# Patient Record
Sex: Male | Born: 1965
Health system: Southern US, Community
[De-identification: ages and names within clinical notes are randomized; demographics above are authoritative.]

## PROBLEM LIST (undated history)

## (undated) DIAGNOSIS — R195 Other fecal abnormalities: Secondary | ICD-10-CM

## (undated) DIAGNOSIS — M109 Gout, unspecified: Secondary | ICD-10-CM

## (undated) DIAGNOSIS — M1990 Unspecified osteoarthritis, unspecified site: Secondary | ICD-10-CM

## (undated) DIAGNOSIS — Z8049 Family history of malignant neoplasm of other genital organs: Secondary | ICD-10-CM

## (undated) DIAGNOSIS — G4730 Sleep apnea, unspecified: Secondary | ICD-10-CM

## (undated) DIAGNOSIS — Z809 Family history of malignant neoplasm, unspecified: Secondary | ICD-10-CM

## (undated) DIAGNOSIS — E785 Hyperlipidemia, unspecified: Secondary | ICD-10-CM

## (undated) DIAGNOSIS — C099 Malignant neoplasm of tonsil, unspecified: Secondary | ICD-10-CM

## (undated) HISTORY — DX: Gout, unspecified: M10.9

## (undated) HISTORY — PX: WISDOM TOOTH EXTRACTION: SHX21

## (undated) HISTORY — PX: OTHER SURGICAL HISTORY: SHX169

## (undated) HISTORY — DX: Family history of malignant neoplasm of other genital organs: Z80.49

## (undated) HISTORY — DX: Sleep apnea, unspecified: G47.30

## (undated) HISTORY — DX: Other fecal abnormalities: R19.5

## (undated) HISTORY — DX: Hyperlipidemia, unspecified: E78.5

## (undated) HISTORY — DX: Family history of malignant neoplasm, unspecified: Z80.9

---

## 2006-08-12 ENCOUNTER — Encounter: Admission: RE | Admit: 2006-08-12 | Discharge: 2006-09-28 | Payer: Self-pay | Admitting: Orthopedic Surgery

## 2015-12-05 ENCOUNTER — Ambulatory Visit (INDEPENDENT_AMBULATORY_CARE_PROVIDER_SITE_OTHER): Payer: 59 | Admitting: Otolaryngology

## 2015-12-05 DIAGNOSIS — H60332 Swimmer's ear, left ear: Secondary | ICD-10-CM | POA: Diagnosis not present

## 2015-12-19 ENCOUNTER — Ambulatory Visit (INDEPENDENT_AMBULATORY_CARE_PROVIDER_SITE_OTHER): Payer: 59 | Admitting: Otolaryngology

## 2015-12-19 DIAGNOSIS — H60332 Swimmer's ear, left ear: Secondary | ICD-10-CM

## 2017-01-25 ENCOUNTER — Encounter (INDEPENDENT_AMBULATORY_CARE_PROVIDER_SITE_OTHER): Payer: Self-pay | Admitting: Internal Medicine

## 2017-01-25 ENCOUNTER — Encounter (INDEPENDENT_AMBULATORY_CARE_PROVIDER_SITE_OTHER): Payer: Self-pay

## 2017-02-22 ENCOUNTER — Telehealth (INDEPENDENT_AMBULATORY_CARE_PROVIDER_SITE_OTHER): Payer: Self-pay | Admitting: *Deleted

## 2017-02-22 ENCOUNTER — Ambulatory Visit (INDEPENDENT_AMBULATORY_CARE_PROVIDER_SITE_OTHER): Payer: 59 | Admitting: Internal Medicine

## 2017-02-22 ENCOUNTER — Encounter (INDEPENDENT_AMBULATORY_CARE_PROVIDER_SITE_OTHER): Payer: Self-pay | Admitting: *Deleted

## 2017-02-22 ENCOUNTER — Other Ambulatory Visit (INDEPENDENT_AMBULATORY_CARE_PROVIDER_SITE_OTHER): Payer: Self-pay | Admitting: Internal Medicine

## 2017-02-22 ENCOUNTER — Encounter (INDEPENDENT_AMBULATORY_CARE_PROVIDER_SITE_OTHER): Payer: Self-pay | Admitting: Internal Medicine

## 2017-02-22 VITALS — BP 170/90 | HR 80 | Temp 98.1°F | Ht 68.5 in | Wt 312.5 lb

## 2017-02-22 DIAGNOSIS — R195 Other fecal abnormalities: Secondary | ICD-10-CM

## 2017-02-22 DIAGNOSIS — R748 Abnormal levels of other serum enzymes: Secondary | ICD-10-CM | POA: Diagnosis not present

## 2017-02-22 DIAGNOSIS — M109 Gout, unspecified: Secondary | ICD-10-CM

## 2017-02-22 HISTORY — DX: Gout, unspecified: M10.9

## 2017-02-22 HISTORY — DX: Other fecal abnormalities: R19.5

## 2017-02-22 LAB — CBC WITH DIFFERENTIAL/PLATELET
BASOS PCT: 0.7 %
Basophils Absolute: 50 cells/uL (ref 0–200)
EOS PCT: 2.5 %
Eosinophils Absolute: 180 cells/uL (ref 15–500)
HCT: 43.5 % (ref 38.5–50.0)
Hemoglobin: 15.2 g/dL (ref 13.2–17.1)
Lymphs Abs: 2657 cells/uL (ref 850–3900)
MCH: 30.9 pg (ref 27.0–33.0)
MCHC: 34.9 g/dL (ref 32.0–36.0)
MCV: 88.4 fL (ref 80.0–100.0)
MONOS PCT: 9.2 %
MPV: 10 fL (ref 7.5–12.5)
NEUTROS PCT: 50.7 %
Neutro Abs: 3650 cells/uL (ref 1500–7800)
PLATELETS: 216 10*3/uL (ref 140–400)
RBC: 4.92 10*6/uL (ref 4.20–5.80)
RDW: 13 % (ref 11.0–15.0)
TOTAL LYMPHOCYTE: 36.9 %
WBC: 7.2 10*3/uL (ref 3.8–10.8)
WBCMIX: 662 {cells}/uL (ref 200–950)

## 2017-02-22 MED ORDER — DICYCLOMINE HCL 10 MG PO CAPS: 10.0000 mg | ORAL_CAPSULE | Freq: Three times a day (TID) | ORAL | 3 refills | Status: DC

## 2017-02-22 NOTE — Telephone Encounter (Signed)
Patient needs trilyte 

## 2017-02-22 NOTE — Progress Notes (Addendum)
   Subjective:    Patient ID: Dustin Rios, male    DOB: Nov 10, 1965, 51 y.o.   MRN: [id number] HPI Referred by Dr. Scotty Court for change in his stools   Per Dr.. Rayna Sexton notes, CT in February of this was normal. In February, he had a mild headache, nausea, lower abdominal pain, fever (low grade), fatigue.  Went to Urgent Care and told he had the flu. Given Tamaflu. Saw Dr. Scotty Court, following week. Had blood work, liver enzymes were elevated and underwent a CT. States his spleen was enlarged. Given antibiotics. He did get better. No symptoms since. About 3 months ago, he was rough housing with his grand kids, he started having pain LLQ radiating into scrotum. Saw Dr. Scotty Court again. Also he started having problems having BM's. He now has urgency. Sometimes he will have 3-4 BM's in the morning. He has had 2 incidences of incontinence.. If he coughs or sneezes, he has the LLQ pulling.  His appetite has been good. No weight loss.  States his stools are not quite as solid as they use to be.  Urgency does not occur ever day.   Last colonoscopy in 2010 by Dr .Laural Golden for lower abdominal pain and rectal bleeding.: Normal colonoscopy except for small external hemorrhoids.  07/08/2016 CT abdomen/pelvis with CM: lower abdominal pain. No acute or focal lesion. Fat herniates into the rt inguinal canal and periumbilical region. No bowel associated at either level. Mild splenomegaly without etiology. Degenerative changes within the lower lumbar spine.   07/04/2008 CT abdomen/pelvis with CM: Rt lower quadrant pain and rectal bleeding  No acute findings in the abdomen. Review of Systems Past Medical History:  Diagnosis Date  . Change in stool 02/22/2017  . Gout 02/22/2017    No past surgical history on file.  No Known Allergies  No current outpatient prescriptions on file prior to visit.   No current facility-administered medications on file prior to visit.    Current Outpatient Prescriptions  Medication  Sig Dispense Refill  . colchicine 0.6 MG tablet Take 0.6 mg by mouth as needed.     No current facility-administered medications for this visit.          Objective:   Physical Exam Blood pressure (!) 170/90, pulse 80, temperature 98.1 F (36.7 C), height 5' 8.5" (1.74 m), weight (!) 312 lb 8 oz (141.7 kg). Alert and oriented. Skin warm and dry. Oral mucosa is moist.   . Sclera anicteric, conjunctivae is pink. Thyroid not enlarged. No cervical lymphadenopathy. Lungs clear. Heart regular rate and rhythm.  Abdomen is soft. Bowel sounds are positive. No hepatomegaly. No abdominal masses felt. No tenderness.  1+ edema to lower extremities.           Assessment & Plan:   Change in Stools. Last colonoscopy 6-7 yrs ago and was normal (rectal bleeding). Will go ahead ans set up for a colonoscopy.  Will get last colonoscopy report from [facility].  Elevated liver enzymes: CMET, CBC. Will get labs from DR. Tapper and CT scan done in February.

## 2017-02-22 NOTE — Patient Instructions (Signed)
Labs. The risks of bleeding, perforation and infection were reviewed with patient.  

## 2017-02-23 MED ORDER — PEG 3350-KCL-NA BICARB-NACL 420 G PO SOLR: 4000.0000 mL | Freq: Once | ORAL | 0 refills | Status: AC

## 2017-03-22 NOTE — Progress Notes (Signed)
Dustin Rios at Dr. Rayna Sexton office is going to have the nurse to fax his labs to [place].

## 2017-03-26 ENCOUNTER — Ambulatory Visit (HOSPITAL_COMMUNITY): Admission: RE | Admit: 2017-03-26 | Payer: 59 | Source: Ambulatory Visit | Admitting: Internal Medicine

## 2017-03-26 ENCOUNTER — Encounter (HOSPITAL_COMMUNITY): Admission: RE | Payer: Self-pay | Source: Ambulatory Visit

## 2017-03-26 SURGERY — COLONOSCOPY
Anesthesia: Moderate Sedation

## 2019-04-03 ENCOUNTER — Encounter: Payer: Self-pay | Admitting: Family Medicine

## 2019-04-03 ENCOUNTER — Ambulatory Visit: Payer: 59 | Admitting: Family Medicine

## 2019-04-03 ENCOUNTER — Telehealth: Payer: Self-pay | Admitting: Family Medicine

## 2019-04-03 ENCOUNTER — Other Ambulatory Visit: Payer: Self-pay

## 2019-04-03 ENCOUNTER — Other Ambulatory Visit: Payer: Self-pay | Admitting: Physician Assistant

## 2019-04-03 VITALS — BP 139/84 | HR 80 | Temp 97.5°F | Ht 68.0 in | Wt 310.0 lb

## 2019-04-03 DIAGNOSIS — E782 Mixed hyperlipidemia: Secondary | ICD-10-CM | POA: Diagnosis not present

## 2019-04-03 DIAGNOSIS — M10031 Idiopathic gout, right wrist: Secondary | ICD-10-CM

## 2019-04-03 DIAGNOSIS — Z125 Encounter for screening for malignant neoplasm of prostate: Secondary | ICD-10-CM

## 2019-04-03 LAB — BAYER DCA HB A1C WAIVED: HB A1C (BAYER DCA - WAIVED): 5.7 % (ref <7.0)

## 2019-04-03 MED ORDER — COLCHICINE 0.6 MG PO TABS: 0.6000 mg | ORAL_TABLET | Freq: Two times a day (BID) | ORAL | 1 refills | Status: DC | PRN

## 2019-04-03 MED ORDER — PREDNISONE 10 MG PO TABS: ORAL_TABLET | ORAL | 0 refills | Status: DC

## 2019-04-03 NOTE — Progress Notes (Signed)
Subjective: CC: wrist pain PCP: Terald Sleeper, PA-C EHU:[id number] Leath is a 53 y.o. male presenting to clinic today for:  1. Wrist pain Patient reports onset of right wrist pain and swelling on Thursday.  He thought this was consistent with his typical gout flare and has since been taking his colchicine capsules as directed.  He notes he was previously on colchicine tablets which he felt was more effective but he was switched to the capsules due to insurance.  He has been previously treated with prednisone which she also found to be very helpful.  He was actually scheduled for carpal tunnel surgery this morning but his surgeon canceled the surgery after seeing that his wrist was persistently swollen.  Patient denies any alcohol use.  He may have eaten red meat which cause the flare.  No known renal disease.   ROS: Per HPI  No Known Allergies Past Medical History:  Diagnosis Date  . Change in stool 02/22/2017  . Gout 02/22/2017    Current Outpatient Medications:  .  colchicine 0.6 MG tablet, Take 0.6-1.2 mg 2 (two) times daily as needed by mouth (for gout flare ups.). , Disp: , Rfl:    Social History   Socioeconomic History  . Marital status: Married    Spouse name: Not on file  . Number of children: Not on file  . Years of education: Not on file  . Highest education level: Not on file  Occupational History  . Not on file  Social Needs  . Financial resource strain: Not on file  . Food insecurity    Worry: Not on file    Inability: Not on file  . Transportation needs    Medical: Not on file    Non-medical: Not on file  Tobacco Use  . Smoking status: Never Smoker  . Smokeless tobacco: Never Used  Substance and Sexual Activity  . Alcohol use: No  . Drug use: No  . Sexual activity: Not on file  Lifestyle  . Physical activity    Days per week: Not on file    Minutes per session: Not on file  . Stress: Not on file  Relationships  . Social Herbalist on  phone: Not on file    Gets together: Not on file    Attends religious service: Not on file    Active member of club or organization: Not on file    Attends meetings of clubs or organizations: Not on file    Relationship status: Not on file  . Intimate partner violence    Fear of current or ex partner: Not on file    Emotionally abused: Not on file    Physically abused: Not on file    Forced sexual activity: Not on file  Other Topics Concern  . Not on file  Social History Narrative  . Not on file   No family history on file.  Objective: Office vital signs reviewed. BP 139/84   Pulse 80   Temp (!) 97.5 F (36.4 C) (Temporal)   Ht 5' 8" (1.727 m)   Wt (!) 310 lb (140.6 kg)   BMI 47.14 kg/m   Physical Examination:  General: Awake, alert, obese, No acute distress HEENT: Normal.  Sclera white.  Moist mucous membranes Cardio: regular rate.  +2 radial pulses Pulm: No wheezes.  Normal work of breathing on room air Extremities: warm, well perfused, mild to moderate nonpitting edema of the right wrist noted.  No cyanosis  or clubbing; +2 pulses bilaterally MSK:   Right wrist: Mild to moderate nonpitting soft tissue swelling noted.  He has limited active range of motion throughout secondary to pain and swelling.  No substantial warmth or redness noted over the right wrist.  Minimal tenderness palpation over the wrist. Neuro: Light touch sensation grossly intact.  Assessment/ Plan: 53 y.o. male   1. Acute idiopathic gout of right wrist Will empirically treat for gout.  We discussed the possible differential diagnosis including swelling secondary to known carpal tunnel/degenerative changes versus autoimmune versus infectious.  I suspect likely gout flare.  I have given him prednisone as a long taper to prevent rebound flare.  He has follow-up with PCP to establish care in about a week's time.  We will obtain a uric acid level as well as other labs today in preparation for his checkup -  CMP14+EGFR - CBC - Uric Acid - predniSONE (DELTASONE) 10 MG tablet; Take 60mg by mouth day 1-2, 50mg day 3-4, 40mg day 5-6, 30mg day 7-8, 20mg day 9-10, 10mg day 11-12.  Then stop.  Dispense: 42 tablet; Refill: 0  2. Moderate mixed hyperlipidemia not requiring statin therapy Not discussed but will obtain lipid and TSH in preparation for his upcoming appointment - Lipid Panel - TSH  3. Morbid obesity (HCC) At TSH - Bayer DCA Hb A1c Waived  4. Screening for malignant neoplasm of prostate - PSA   No orders of the defined types were placed in this encounter.  No orders of the defined types were placed in this encounter.    Ashly M Gottschalk, DO [facility] [phone]   

## 2019-04-03 NOTE — Patient Instructions (Signed)

## 2019-04-03 NOTE — Telephone Encounter (Signed)
Appointment scheduled today at 11:15 with Dr. Lajuana Ripple.  Patient aware.

## 2019-04-04 ENCOUNTER — Telehealth: Payer: Self-pay | Admitting: *Deleted

## 2019-04-04 ENCOUNTER — Telehealth: Payer: Self-pay | Admitting: Physician Assistant

## 2019-04-04 ENCOUNTER — Other Ambulatory Visit: Payer: Self-pay | Admitting: Family Medicine

## 2019-04-04 LAB — CMP14+EGFR
ALT: 32 IU/L (ref 0–44)
AST: 24 IU/L (ref 0–40)
Albumin/Globulin Ratio: 1.4 (ref 1.2–2.2)
Albumin: 4.3 g/dL (ref 3.8–4.9)
Alkaline Phosphatase: 92 IU/L (ref 39–117)
BUN/Creatinine Ratio: 14 (ref 9–20)
BUN: 15 mg/dL (ref 6–24)
Bilirubin Total: 0.3 mg/dL (ref 0.0–1.2)
CO2: 26 mmol/L (ref 20–29)
Calcium: 9.2 mg/dL (ref 8.7–10.2)
Chloride: 101 mmol/L (ref 96–106)
Creatinine, Ser: 1.09 mg/dL (ref 0.76–1.27)
GFR calc Af Amer: 88 mL/min/{1.73_m2} (ref >59)
GFR calc non Af Amer: 77 mL/min/{1.73_m2} (ref >59)
Globulin, Total: 3 g/dL (ref 1.5–4.5)
Glucose: 79 mg/dL (ref 65–99)
Potassium: 4.3 mmol/L (ref 3.5–5.2)
Sodium: 140 mmol/L (ref 134–144)
Total Protein: 7.3 g/dL (ref 6.0–8.5)

## 2019-04-04 LAB — CBC
Hematocrit: 45.4 % (ref 37.5–51.0)
Hemoglobin: 15.5 g/dL (ref 13.0–17.7)
MCH: 30.9 pg (ref 26.6–33.0)
MCHC: 34.1 g/dL (ref 31.5–35.7)
MCV: 90 fL (ref 79–97)
Platelets: 253 10*3/uL (ref 150–450)
RBC: 5.02 x10E6/uL (ref 4.14–5.80)
RDW: 13.1 % (ref 11.6–15.4)
WBC: 7.7 10*3/uL (ref 3.4–10.8)

## 2019-04-04 LAB — LIPID PANEL
Chol/HDL Ratio: 3.7 ratio (ref 0.0–5.0)
Cholesterol, Total: 125 mg/dL (ref 100–199)
HDL: 34 mg/dL — ABNORMAL LOW (ref >39)
LDL Chol Calc (NIH): 50 mg/dL (ref 0–99)
Triglycerides: 265 mg/dL — ABNORMAL HIGH (ref 0–149)
VLDL Cholesterol Cal: 41 mg/dL — ABNORMAL HIGH (ref 5–40)

## 2019-04-04 LAB — TSH: TSH: 2.93 u[IU]/mL (ref 0.450–4.500)

## 2019-04-04 LAB — URIC ACID: Uric Acid: 7.2 mg/dL (ref 3.7–8.6)

## 2019-04-04 LAB — PSA: Prostate Specific Ag, Serum: 0.7 ng/mL (ref 0.0–4.0)

## 2019-04-04 MED ORDER — COLCHICINE 0.6 MG PO CAPS: ORAL_CAPSULE | ORAL | 2 refills | Status: DC

## 2019-04-04 NOTE — Telephone Encounter (Signed)
Yes. That's what he has.  Is he out?

## 2019-04-04 NOTE — Telephone Encounter (Signed)
Patient aware.

## 2019-04-04 NOTE — Telephone Encounter (Signed)
Patient states he is going to check with pharmacy to see price difference.

## 2019-04-04 NOTE — Telephone Encounter (Signed)
done

## 2019-04-04 NOTE — Telephone Encounter (Signed)
Patient states that he has 5-6 pills left.

## 2019-04-04 NOTE — Telephone Encounter (Signed)
Fax from [facility] Rx colchicine 0.6 mg tablet Insurance prefers mitigane capsules

## 2019-04-10 HISTORY — PX: CARPAL TUNNEL RELEASE: SHX101

## 2019-04-12 ENCOUNTER — Other Ambulatory Visit: Payer: Self-pay

## 2019-04-13 ENCOUNTER — Ambulatory Visit: Payer: Self-pay | Admitting: Physician Assistant

## 2019-05-15 ENCOUNTER — Telehealth: Payer: Self-pay | Admitting: Physician Assistant

## 2019-05-15 NOTE — Telephone Encounter (Signed)
He should be able to drop this off and his PCP fill out.  She can use his last OV for details/ out date, etc.  We do this quite often when patient is seen by other specialists even if we did not see them for issue.  That will help keep everything in one place.

## 2019-05-15 NOTE — Telephone Encounter (Signed)
Pt has an upcoming appt to establish care with Particia Nearing on 06/08/2019 but says his FMLA paperwork that he has renewed every 6 months is due by 05/30/2019. Wants to know if he can be seen before 05/30/2019 or if Dr Darnell Level can fill out his FMLA paperwork since he saw her on 04/03/2019.

## 2019-05-15 NOTE — Telephone Encounter (Signed)
Dr Darnell Level would you be able to fill out FMLA paperwork out for gout since he saw you on 04/03/19 for gout flare up. I advised pt I wasn't sure we would be able to since you have only seen him the one time. Please advise?

## 2019-05-16 DIAGNOSIS — Z029 Encounter for administrative examinations, unspecified: Secondary | ICD-10-CM

## 2019-05-16 NOTE — Telephone Encounter (Signed)
Tye [place] get these started and I sign or find the answer if needed.  So okay with me.

## 2019-05-16 NOTE — Telephone Encounter (Signed)
Spoke with pt and advised him to bring in the forms. He says he also has a copy of the last FMLA forms that were filled out with his last PCP and will bring those in as well to help.

## 2019-06-07 ENCOUNTER — Other Ambulatory Visit: Payer: Self-pay

## 2019-06-08 ENCOUNTER — Ambulatory Visit: Payer: 59 | Admitting: Physician Assistant

## 2019-06-08 ENCOUNTER — Encounter: Payer: Self-pay | Admitting: Physician Assistant

## 2019-06-08 VITALS — BP 166/93 | HR 95 | Temp 97.4°F | Ht 68.0 in | Wt 314.6 lb

## 2019-06-08 DIAGNOSIS — M10031 Idiopathic gout, right wrist: Secondary | ICD-10-CM | POA: Diagnosis not present

## 2019-06-08 DIAGNOSIS — Z23 Encounter for immunization: Secondary | ICD-10-CM | POA: Diagnosis not present

## 2019-06-08 DIAGNOSIS — G4733 Obstructive sleep apnea (adult) (pediatric): Secondary | ICD-10-CM | POA: Insufficient documentation

## 2019-06-08 DIAGNOSIS — G5602 Carpal tunnel syndrome, left upper limb: Secondary | ICD-10-CM

## 2019-06-08 DIAGNOSIS — Z1211 Encounter for screening for malignant neoplasm of colon: Secondary | ICD-10-CM

## 2019-06-08 DIAGNOSIS — K429 Umbilical hernia without obstruction or gangrene: Secondary | ICD-10-CM

## 2019-06-08 DIAGNOSIS — E782 Mixed hyperlipidemia: Secondary | ICD-10-CM | POA: Diagnosis not present

## 2019-06-08 NOTE — Patient Instructions (Signed)
DASH Eating Plan DASH stands for "Dietary Approaches to Stop Hypertension." The DASH eating plan is a healthy eating plan that has been shown to reduce high blood pressure (hypertension). It may also reduce your risk for type 2 diabetes, heart disease, and stroke. The DASH eating plan may also help with weight loss. What are tips for following this plan?  General guidelines  Avoid eating more than 2,300 mg (milligrams) of salt (sodium) a day. If you have hypertension, you may need to reduce your sodium intake to 1,500 mg a day.  Limit alcohol intake to no more than 1 drink a day for nonpregnant women and 2 drinks a day for men. One drink equals 12 oz of beer, 5 oz of wine, or 1 oz of hard liquor.  Work with your health care provider to maintain a healthy body weight or to lose weight. Ask what an ideal weight is for you.  Get at least 30 minutes of exercise that causes your heart to beat faster (aerobic exercise) most days of the week. Activities may include walking, swimming, or biking.  Work with your health care provider or diet and nutrition specialist (dietitian) to adjust your eating plan to your individual calorie needs. Reading food labels   Check food labels for the amount of sodium per serving. Choose foods with less than 5 percent of the Daily Value of sodium. Generally, foods with less than 300 mg of sodium per serving fit into this eating plan.  To find whole grains, look for the word "whole" as the first word in the ingredient list. Shopping  Buy products labeled as "low-sodium" or "no salt added."  Buy fresh foods. Avoid canned foods and premade or frozen meals. Cooking  Avoid adding salt when cooking. Use salt-free seasonings or herbs instead of table salt or sea salt. Check with your health care provider or pharmacist before using salt substitutes.  Do not fry foods. Cook foods using healthy methods such as baking, boiling, grilling, and broiling instead.  Cook with  heart-healthy oils, such as olive, canola, soybean, or sunflower oil. Meal planning  Eat a balanced diet that includes: ? 5 or more servings of fruits and vegetables each day. At each meal, try to fill half of your plate with fruits and vegetables. ? Up to 6-8 servings of whole grains each day. ? Less than 6 oz of lean meat, poultry, or fish each day. A 3-oz serving of meat is about the same size as a deck of cards. One egg equals 1 oz. ? 2 servings of low-fat dairy each day. ? A serving of nuts, seeds, or beans 5 times each week. ? Heart-healthy fats. Healthy fats called Omega-3 fatty acids are found in foods such as flaxseeds and coldwater fish, like sardines, salmon, and mackerel.  Limit how much you eat of the following: ? Canned or prepackaged foods. ? Food that is high in trans fat, such as fried foods. ? Food that is high in saturated fat, such as fatty meat. ? Sweets, desserts, sugary drinks, and other foods with added sugar. ? Full-fat dairy products.  Do not salt foods before eating.  Try to eat at least 2 vegetarian meals each week.  Eat more home-cooked food and less restaurant, buffet, and fast food.  When eating at a restaurant, ask that your food be prepared with less salt or no salt, if possible. What foods are recommended? The items listed may not be a complete list. Talk with your dietitian about   what dietary choices are best for you. Grains Whole-grain or whole-wheat bread. Whole-grain or whole-wheat pasta. Brown rice. Oatmeal. Quinoa. Bulgur. Whole-grain and low-sodium cereals. Pita bread. Low-fat, low-sodium crackers. Whole-wheat flour tortillas. Vegetables Fresh or frozen vegetables (raw, steamed, roasted, or grilled). Low-sodium or reduced-sodium tomato and vegetable juice. Low-sodium or reduced-sodium tomato sauce and tomato paste. Low-sodium or reduced-sodium canned vegetables. Fruits All fresh, dried, or frozen fruit. Canned fruit in natural juice (without  added sugar). Meat and other protein foods Skinless chicken or turkey. Ground chicken or turkey. Pork with fat trimmed off. Fish and seafood. Egg whites. Dried beans, peas, or lentils. Unsalted nuts, nut butters, and seeds. Unsalted canned beans. Lean cuts of beef with fat trimmed off. Low-sodium, lean deli meat. Dairy Low-fat (1%) or fat-free (skim) milk. Fat-free, low-fat, or reduced-fat cheeses. Nonfat, low-sodium ricotta or cottage cheese. Low-fat or nonfat yogurt. Low-fat, low-sodium cheese. Fats and oils Soft margarine without trans fats. Vegetable oil. Low-fat, reduced-fat, or light mayonnaise and salad dressings (reduced-sodium). Canola, safflower, olive, soybean, and sunflower oils. Avocado. Seasoning and other foods Herbs. Spices. Seasoning mixes without salt. Unsalted popcorn and pretzels. Fat-free sweets. What foods are not recommended? The items listed may not be a complete list. Talk with your dietitian about what dietary choices are best for you. Grains Baked goods made with fat, such as croissants, muffins, or some breads. Dry pasta or rice meal packs. Vegetables Creamed or fried vegetables. Vegetables in a cheese sauce. Regular canned vegetables (not low-sodium or reduced-sodium). Regular canned tomato sauce and paste (not low-sodium or reduced-sodium). Regular tomato and vegetable juice (not low-sodium or reduced-sodium). Pickles. Olives. Fruits Canned fruit in a light or heavy syrup. Fried fruit. Fruit in cream or butter sauce. Meat and other protein foods Fatty cuts of meat. Ribs. Fried meat. Bacon. Sausage. Bologna and other processed lunch meats. Salami. Fatback. Hotdogs. Bratwurst. Salted nuts and seeds. Canned beans with added salt. Canned or smoked fish. Whole eggs or egg yolks. Chicken or turkey with skin. Dairy Whole or 2% milk, cream, and half-and-half. Whole or full-fat cream cheese. Whole-fat or sweetened yogurt. Full-fat cheese. Nondairy creamers. Whipped toppings.  Processed cheese and cheese spreads. Fats and oils Butter. Stick margarine. Lard. Shortening. Ghee. Bacon fat. Tropical oils, such as coconut, palm kernel, or palm oil. Seasoning and other foods Salted popcorn and pretzels. Onion salt, garlic salt, seasoned salt, table salt, and sea salt. Worcestershire sauce. Tartar sauce. Barbecue sauce. Teriyaki sauce. Soy sauce, including reduced-sodium. Steak sauce. Canned and packaged gravies. Fish sauce. Oyster sauce. Cocktail sauce. Horseradish that you find on the shelf. Ketchup. Mustard. Meat flavorings and tenderizers. Bouillon cubes. Hot sauce and Tabasco sauce. Premade or packaged marinades. Premade or packaged taco seasonings. Relishes. Regular salad dressings. Where to find more information:  National Heart, Lung, and Blood Institute: www.nhlbi.nih.gov  American Heart Association: www.heart.org Summary  The DASH eating plan is a healthy eating plan that has been shown to reduce high blood pressure (hypertension). It may also reduce your risk for type 2 diabetes, heart disease, and stroke.  With the DASH eating plan, you should limit salt (sodium) intake to 2,300 mg a day. If you have hypertension, you may need to reduce your sodium intake to 1,500 mg a day.  When on the DASH eating plan, aim to eat more fresh fruits and vegetables, whole grains, lean proteins, low-fat dairy, and heart-healthy fats.  Work with your health care provider or diet and nutrition specialist (dietitian) to adjust your eating plan to your   individual calorie needs. This information is not intended to replace advice given to you by your health care provider. Make sure you discuss any questions you have with your health care provider. Document Revised: 04/09/2017 Document Reviewed: 04/20/2016 Elsevier Patient Education  2020 [employer]  

## 2019-06-09 ENCOUNTER — Encounter (INDEPENDENT_AMBULATORY_CARE_PROVIDER_SITE_OTHER): Payer: Self-pay | Admitting: *Deleted

## 2019-06-11 NOTE — Progress Notes (Addendum)
Acute Office Visit  Subjective:    Patient ID: Dustin Riegel Sr., male    DOB: 04/20/1966, 54 y.o.   MRN: [id number]  Chief Complaint  Patient presents with  . Annual Exam    Wrist Pain  The pain is present in the left knee, right wrist, right hand, left wrist, left hand and right knee. This is a recurrent problem. The current episode started more than 1 year ago. The problem has been waxing and waning. The quality of the pain is described as aching. Pertinent negatives include no numbness. His past medical history is significant for gout.   The patient also is going also to have the appointment to be fully established. He was seen acutely for his gout but this is his visit. He also has obstructive sleep apnea. He has been using his machine for quite some time and does well with that. He was probably tested greater than 10 years ago he reports. He will let us know where his materials are are unknown.  He is also having elevated blood pressure readings of last few weeks. He has taken losartan in the past.  We will have him come back in a few weeks. He notes that he did have a colonoscopy greater than 10 years ago and does want to go back to the same doctor.  He is also dealt with carpal tunnel surgery of the right and will be having the left side. He also has an umbilical hernia that he is waiting to have repaired.    Past Medical History:  Diagnosis Date  . Change in stool 02/22/2017  . Gout 02/22/2017  . Sleep apnea     Past Surgical History:  Procedure Laterality Date  . CARPAL TUNNEL RELEASE Right 04/10/2019    Family History  Problem Relation Age of Onset  . Cancer Mother   . COPD Mother     Social History   Socioeconomic History  . Marital status: Married    Spouse name: Not on file  . Number of children: Not on file  . Years of education: Not on file  . Highest education level: Not on file  Occupational History  . Not on file  Tobacco Use  . Smoking status:  Never Smoker  . Smokeless tobacco: Never Used  Substance and Sexual Activity  . Alcohol use: No  . Drug use: No  . Sexual activity: Not on file  Other Topics Concern  . Not on file  Social History Narrative  . Not on file   Social Determinants of Health   Financial Resource Strain:   . Difficulty of Paying Living Expenses: Not on file  Food Insecurity:   . Worried About Charity fundraiser in the Last Year: Not on file  . Ran Out of Food in the Last Year: Not on file  Transportation Needs:   . Lack of Transportation (Medical): Not on file  . Lack of Transportation (Non-Medical): Not on file  Physical Activity:   . Days of Exercise per Week: Not on file  . Minutes of Exercise per Session: Not on file  Stress:   . Feeling of Stress : Not on file  Social Connections:   . Frequency of Communication with Friends and Family: Not on file  . Frequency of Social Gatherings with Friends and Family: Not on file  . Attends Religious Services: Not on file  . Active Member of Clubs or Organizations: Not on file  . Attends Archivist  Meetings: Not on file  . Marital Status: Not on file  Intimate Partner Violence:   . Fear of Current or Ex-Partner: Not on file  . Emotionally Abused: Not on file  . Physically Abused: Not on file  . Sexually Abused: Not on file    Outpatient Medications Prior to Visit  Medication Sig Dispense Refill  . Colchicine (MITIGARE) 0.6 MG CAPS Take 2 capsules at onset of flare. Then take 1 capsule 1 hour later.  May take 1 capsule up to twice daily thereafter. 60 capsule 2  . Ibuprofen 200 MG CAPS Take by mouth.    . predniSONE (DELTASONE) 10 MG tablet Take 60mg  by mouth day 1-2, 50mg  day 3-4, 40mg  day 5-6, 30mg  day 7-8, 20mg  day 9-10, 10mg  day 11-12.  Then stop. 42 tablet 0   No facility-administered medications prior to visit.    No Known Allergies  Review of Systems  Constitutional: Negative.  Negative for appetite change and fatigue.  HENT:  Negative.   Eyes: Negative.  Negative for pain and visual disturbance.  Respiratory: Negative.  Negative for cough, chest tightness, shortness of breath and wheezing.   Cardiovascular: Negative.  Negative for chest pain, palpitations and leg swelling.  Gastrointestinal: Positive for abdominal distention. Negative for abdominal pain, diarrhea, nausea and vomiting.  Endocrine: Negative.   Genitourinary: Negative.   Musculoskeletal: Positive for arthralgias, gout, joint swelling and myalgias.  Skin: Negative.  Negative for color change and rash.  Neurological: Negative.  Negative for weakness, numbness and headaches.  Psychiatric/Behavioral: Negative.        Objective:    Physical Exam Constitutional:      Appearance: He is well-developed.  HENT:     Head: Normocephalic and atraumatic.  Eyes:     Conjunctiva/sclera: Conjunctivae normal.     Pupils: Pupils are equal, round, and reactive to light.  Cardiovascular:     Rate and Rhythm: Normal rate and regular rhythm.     Heart sounds: Normal heart sounds.  Pulmonary:     Effort: Pulmonary effort is normal.     Breath sounds: Normal breath sounds.  Abdominal:     General: Bowel sounds are normal.     Palpations: Abdomen is soft.  Musculoskeletal:        General: Normal range of motion.     Cervical back: Normal range of motion and neck supple.  Skin:    General: Skin is warm and dry.     BP (!) 166/93   Pulse 95   Temp (!) 97.4 F (36.3 C) (Temporal)   Ht 5\' 8"  (1.727 m)   Wt (!) 314 lb 9.6 oz (142.7 kg)   SpO2 96%   BMI 47.83 kg/m  Wt Readings from Last 3 Encounters:  06/08/19 (!) 314 lb 9.6 oz (142.7 kg)  04/03/19 (!) 310 lb (140.6 kg)  02/22/17 (!) 312 lb 8 oz (141.7 kg)    Health Maintenance Due  Topic Date Due  . HIV Screening  12/18/1979  . COLONOSCOPY  07/14/2018    There are no preventive care reminders to display for this patient.   Lab Results  Component Value Date   TSH 2.930 04/03/2019   Lab  Results  Component Value Date   WBC 7.7 04/03/2019   HGB 15.5 04/03/2019   HCT 45.4 04/03/2019   MCV 90 04/03/2019   PLT 253 04/03/2019   Lab Results  Component Value Date   NA 140 04/03/2019   K 4.3 04/03/2019   CO2  26 04/03/2019   GLUCOSE 79 04/03/2019   BUN 15 04/03/2019   CREATININE 1.09 04/03/2019   BILITOT 0.3 04/03/2019   ALKPHOS 92 04/03/2019   AST 24 04/03/2019   ALT 32 04/03/2019   PROT 7.3 04/03/2019   ALBUMIN 4.3 04/03/2019   CALCIUM 9.2 04/03/2019   Lab Results  Component Value Date   CHOL 125 04/03/2019   Lab Results  Component Value Date   HDL 34 (L) 04/03/2019   Lab Results  Component Value Date   LDLCALC 50 04/03/2019   Lab Results  Component Value Date   TRIG 265 (H) 04/03/2019   Lab Results  Component Value Date   CHOLHDL 3.7 04/03/2019   Lab Results  Component Value Date   HGBA1C 5.7 04/03/2019       Assessment & Plan:   Problem List Items Addressed This Visit      Respiratory   OSA (obstructive sleep apnea)     Other   Gout - Primary   Relevant Medications   Ibuprofen 200 MG CAPS   Morbid obesity (HCC)   Moderate mixed hyperlipidemia not requiring statin therapy    Other Visit Diagnoses    Carpal tunnel syndrome of left wrist       Umbilical hernia without obstruction and without gangrene       Colon cancer screening       Relevant Orders   Ambulatory referral to Gastroenterology       No orders of the defined types were placed in this encounter.    Terald Sleeper, PA-C

## 2019-07-07 ENCOUNTER — Other Ambulatory Visit: Payer: Self-pay | Admitting: Otolaryngology

## 2019-07-07 DIAGNOSIS — K1121 Acute sialoadenitis: Secondary | ICD-10-CM

## 2019-07-13 ENCOUNTER — Ambulatory Visit
Admission: RE | Admit: 2019-07-13 | Discharge: 2019-07-13 | Disposition: A | Discharge status: Home / Self Care | Payer: 59 | Source: Ambulatory Visit | Attending: Otolaryngology | Admitting: Otolaryngology

## 2019-07-13 ENCOUNTER — Other Ambulatory Visit: Payer: Self-pay

## 2019-07-13 DIAGNOSIS — K1121 Acute sialoadenitis: Secondary | ICD-10-CM

## 2019-07-13 MED ORDER — IOPAMIDOL (ISOVUE-300) INJECTION 61%
75.0000 mL | Freq: Once | INTRAVENOUS | Status: AC | PRN
  Administered 2019-07-13: 75 mL via INTRAVENOUS

## 2019-07-20 ENCOUNTER — Other Ambulatory Visit (HOSPITAL_COMMUNITY): Payer: Self-pay | Admitting: Otolaryngology

## 2019-07-20 ENCOUNTER — Encounter (HOSPITAL_COMMUNITY): Payer: Self-pay | Admitting: Radiology

## 2019-07-20 DIAGNOSIS — R590 Localized enlarged lymph nodes: Secondary | ICD-10-CM

## 2019-07-20 NOTE — Progress Notes (Signed)
[name] Male, 54 y.o., [birth year] MRN:  [id number] Phone:  [phone] Jerilynn Mages) PCP:  Terald Sleeper, PA-C Coverage:  [place] [facility]/[employer] Other  RE: US Lymph Node Biopsy Received: Today Message Contents  Sandi Mariscal, MD  Garth Bigness D  US guided R Cx LN Bx.   Sedation per pt request.   Cathren Harsh       Previous Messages   ----- Message -----  From: Garth Bigness D  Sent: 07/20/2019 11:16 AM EST  To: [facility] Procedure Requests  Subject: US Lymph Node Biopsy               Procedure: US Lymph Node Biopsy   Reason:  LAD (lymphadenopathy) of right cervical region, Right Level II Mass   IMPRESSION:  4.1 x 3.2 cm necrotic/cystic node or nodal conglomerate at the right  level II station likely reflecting nodal metastatic disease. No  definite primary mass is identified within the oral cavity, pharynx  or larynx. Correlate with direct visualization and consider direct  tissue sampling. Additionally, PET-CT may be helpful.    Cervical spondylosis as described. A prominent C6-C7 posterior disc  osteophyte contributes to at least moderate bony spinal canal  stenosis. Multilevel neural foraminal narrowing.   History: CT in computer   Provider: Jodi Marble   Provider Contact: [phone]

## 2019-07-25 ENCOUNTER — Other Ambulatory Visit: Payer: Self-pay | Admitting: Radiology

## 2019-07-25 ENCOUNTER — Other Ambulatory Visit: Payer: Self-pay | Admitting: Physician Assistant

## 2019-07-26 ENCOUNTER — Other Ambulatory Visit: Payer: Self-pay

## 2019-07-26 ENCOUNTER — Ambulatory Visit (HOSPITAL_COMMUNITY)
Admission: RE | Admit: 2019-07-26 | Discharge: 2019-07-26 | Disposition: A | Discharge status: Home / Self Care | Payer: 59 | Source: Ambulatory Visit | Attending: Otolaryngology | Admitting: Otolaryngology

## 2019-07-26 ENCOUNTER — Encounter (HOSPITAL_COMMUNITY): Payer: Self-pay

## 2019-07-26 ENCOUNTER — Other Ambulatory Visit (HOSPITAL_COMMUNITY): Payer: Self-pay | Admitting: Otolaryngology

## 2019-07-26 DIAGNOSIS — R590 Localized enlarged lymph nodes: Secondary | ICD-10-CM | POA: Insufficient documentation

## 2019-07-26 MED ORDER — FENTANYL CITRATE (PF) 100 MCG/2ML IJ SOLN
INTRAMUSCULAR | Status: AC
  Filled 2019-07-26: qty 2

## 2019-07-26 MED ORDER — SODIUM CHLORIDE 0.9 % IV SOLN
INTRAVENOUS | Status: AC | PRN
  Administered 2019-07-26: 10 mL/h via INTRAVENOUS

## 2019-07-26 MED ORDER — MIDAZOLAM HCL 2 MG/2ML IJ SOLN
INTRAMUSCULAR | Status: AC
  Filled 2019-07-26: qty 2

## 2019-07-26 MED ORDER — FENTANYL CITRATE (PF) 100 MCG/2ML IJ SOLN
INTRAMUSCULAR | Status: AC | PRN
  Administered 2019-07-26 (×2): 25 ug via INTRAVENOUS
  Administered 2019-07-26: 50 ug via INTRAVENOUS

## 2019-07-26 MED ORDER — MIDAZOLAM HCL 2 MG/2ML IJ SOLN
INTRAMUSCULAR | Status: AC | PRN
  Administered 2019-07-26: 1 mg via INTRAVENOUS
  Administered 2019-07-26 (×2): 0.5 mg via INTRAVENOUS

## 2019-07-26 MED ORDER — SODIUM CHLORIDE 0.9 % IV SOLN: INTRAVENOUS | Status: DC

## 2019-07-26 MED ORDER — LIDOCAINE HCL (PF) 1 % IJ SOLN
INTRAMUSCULAR | Status: AC
  Filled 2019-07-26: qty 30

## 2019-07-26 NOTE — Procedures (Signed)
Interventional Radiology Procedure:   Indications: Right cervical lymphadenopathy  Procedure: US guided core biopsy  Findings: Right upper neck lesion/lymph node.  5 cores obtained.  Complications: None     EBL: less than 10 ml  Plan: Discharge to home in 1 hour   Dustin Rios R. Anselm Pancoast, MD  Pager: [phone]

## 2019-07-26 NOTE — H&P (Signed)
Chief Complaint: Patient was seen in consultation today for right cervical lymph node biopsy.  Referring Physician(s): Wolicki,Karol  Supervising Physician: Markus Daft  Patient Status: [facility] - Out-pt  History of Present Illness: Curtis Markham Sr. is a 54 y.o. male with a past medical history significant for gout who presents today for a right cervical lymph node biopsy. Mr. Silbert noticed non-painful swelling in the right side of his neck for the last month that has progressively worsened. He was treated with steroids and antibiotics, however the swelling persisted. He underwent a CT soft tissue neck w/contrast on 07/13/19 for further evaluation which noted a 4.1 x 3.2 cm necrotic/cystuc node or nodal conglomerate at the right level II station concerning for metastatic disease, no primary noted on this scan. IR has been asked to perform an image guided lymph node biopsy for further evaluation.  Patient reports he feels ok this morning although he is very nervous and has not slept well for several days so he is quite tired. His brother unfortunately passed away last week after being diagnosed with lung cancer several weeks earlier which he is understandably having a hard time with. He is very anxious to get the results of the biopsy so that he can determine a treatment plan and tell his parents what is going on.  Past Medical History:  Diagnosis Date  . Change in stool 02/22/2017  . Gout 02/22/2017  . Sleep apnea     Past Surgical History:  Procedure Laterality Date  . CARPAL TUNNEL RELEASE Right 04/10/2019    Allergies: Patient has no known allergies.  Medications: Prior to Admission medications   Medication Sig Start Date End Date Taking? Authorizing Provider  Colchicine (MITIGARE) 0.6 MG CAPS Take 2 capsules at onset of flare. Then take 1 capsule 1 hour later.  May take 1 capsule up to twice daily thereafter. 04/04/19  Yes Ronnie Doss M, DO  Ibuprofen 200 MG CAPS Take by  mouth.   Yes [provider]     Family History  Problem Relation Age of Onset  . Cancer Mother   . COPD Mother     Social History   Socioeconomic History  . Marital status: Married    Spouse name: Not on file  . Number of children: Not on file  . Years of education: Not on file  . Highest education level: Not on file  Occupational History  . Not on file  Tobacco Use  . Smoking status: Never Smoker  . Smokeless tobacco: Never Used  Substance and Sexual Activity  . Alcohol use: No  . Drug use: No  . Sexual activity: Not on file  Other Topics Concern  . Not on file  Social History Narrative  . Not on file   Social Determinants of Health   Financial Resource Strain:   . Difficulty of Paying Living Expenses:   Food Insecurity:   . Worried About Charity fundraiser in the Last Year:   . Arboriculturist in the Last Year:   Transportation Needs:   . Film/video editor (Medical):   Marland Kitchen Lack of Transportation (Non-Medical):   Physical Activity:   . Days of Exercise per Week:   . Minutes of Exercise per Session:   Stress:   . Feeling of Stress :   Social Connections:   . Frequency of Communication with Friends and Family:   . Frequency of Social Gatherings with Friends and Family:   . Attends Religious Services:   .  Active Member of Clubs or Organizations:   . Attends Archivist Meetings:   Marland Kitchen Marital Status:      Review of Systems: A 12 point ROS discussed and pertinent positives are indicated in the HPI above.  All other systems are negative.  Review of Systems  Constitutional: Positive for fatigue. Negative for chills and fever.  HENT: Negative for trouble swallowing.   Respiratory: Negative for cough and shortness of breath.   Cardiovascular: Negative for chest pain.  Gastrointestinal: Negative for abdominal pain, diarrhea, nausea and vomiting.  Genitourinary: Negative for hematuria.  Musculoskeletal: Negative for back pain.   Neurological: Negative for headaches.  Psychiatric/Behavioral: The patient is nervous/anxious.     Vital Signs: BP 129/79   Pulse 71   Temp 98 F (36.7 C) (Oral)   Resp 16   Ht 5\' 8"  (1.727 m)   Wt 280 lb (127 kg)   SpO2 96%   BMI 42.57 kg/m   Physical Exam Vitals reviewed.  Constitutional:      General: He is not in acute distress.    Appearance: He is not ill-appearing.     Comments: Very pleasant, talkative, good historian.  HENT:     Head: Normocephalic.     Mouth/Throat:     Mouth: Mucous membranes are moist.     Pharynx: Oropharynx is clear. No oropharyngeal exudate or posterior oropharyngeal erythema.  Cardiovascular:     Rate and Rhythm: Normal rate and regular rhythm.  Pulmonary:     Effort: Pulmonary effort is normal.     Breath sounds: Normal breath sounds.  Abdominal:     General: There is no distension.     Palpations: Abdomen is soft.     Tenderness: There is no abdominal tenderness.  Lymphadenopathy:     Cervical: Cervical adenopathy (palpable firm mass right neck; non tender) present.  Skin:    General: Skin is warm and dry.  Neurological:     Mental Status: He is alert and oriented to person, place, and time.  Psychiatric:        Mood and Affect: Mood normal.        Behavior: Behavior normal.        Thought Content: Thought content normal.        Judgment: Judgment normal.      MD Evaluation Airway: WNL Heart: WNL Abdomen: WNL Chest/ Lungs: WNL ASA  Classification: 2 Mallampati/Airway Score: Two   Imaging: CT SOFT TISSUE NECK W CONTRAST  Result Date: 07/13/2019 CLINICAL DATA:  Acute parotitis. Additional history provided: Right-sided facial/neck swelling for 3-4 weeks. EXAM: CT NECK WITH CONTRAST TECHNIQUE: Multidetector CT imaging of the neck was performed using the standard protocol following the bolus administration of intravenous contrast. CONTRAST:  9mL ISOVUE-300 IOPAMIDOL (ISOVUE-300) INJECTION 61% COMPARISON:  No pertinent  prior studies available for comparison. FINDINGS: Pharynx and larynx: Streak artifact from dental restoration limits evaluation of the oral cavity. Within this limitation, no swelling or definite discrete mass is identified within the oral cavity, pharynx or larynx. Salivary glands: No inflammation, mass, or stone. Thyroid: Unremarkable. Lymph nodes: There is an enlarged right level II node or nodal conglomerate measuring 4.1 x 3.2 cm (series 2, image 46) (series 10, image 60). There is ill-defined hypodensity centrally consistent with necrotic and or cystic change. No other pathologically enlarged cervical chain lymph node is identified. Vascular: The major vascular structures of the neck appear patent. Limited intracranial: No abnormality identified. Visualized orbits: Excluded from the field of view.  Mastoids and visualized paranasal sinuses: No significant paranasal sinus disease or mastoid effusion at the imaged levels. Skeleton: No acute bony abnormality or aggressive osseous lesion. Cervical spondylosis with multilevel posterior disc osteophytes, uncovertebral and facet hypertrophy. A prominent posterior disc osteophyte complex at C6-C7 contributes to at least moderate bony spinal canal stenosis. Multilevel neural foraminal narrowing. Upper chest: No consolidation within the imaged lung apices. These results will be called to the ordering clinician or representative by the Radiologist Assistant, and communication documented in the PACS or zVision Dashboard. IMPRESSION: 4.1 x 3.2 cm necrotic/cystic node or nodal conglomerate at the right level II station likely reflecting nodal metastatic disease. No definite primary mass is identified within the oral cavity, pharynx or larynx. Correlate with direct visualization and consider direct tissue sampling. Additionally, PET-CT may be helpful. Cervical spondylosis as described. A prominent C6-C7 posterior disc osteophyte contributes to at least moderate bony spinal  canal stenosis. Multilevel neural foraminal narrowing. Electronically Signed   By: Kellie Simmering DO   On: 07/13/2019 11:39    Labs:  CBC: Recent Labs    04/03/19 1138  WBC 7.7  HGB 15.5  HCT 45.4  PLT 253    COAGS: No results for input(s): INR, APTT in the last 8760 hours.  BMP: Recent Labs    04/03/19 1138  NA 140  K 4.3  CL 101  CO2 26  GLUCOSE 79  BUN 15  CALCIUM 9.2  CREATININE 1.09  GFRNONAA 77  GFRAA 88    LIVER FUNCTION TESTS: Recent Labs    04/03/19 1138  BILITOT 0.3  AST 24  ALT 32  ALKPHOS 92  PROT 7.3  ALBUMIN 4.3    TUMOR MARKERS: No results for input(s): AFPTM, CEA, CA199, CHROMGRNA in the last 8760 hours.  Assessment and Plan:  54 y/o M with history of right neck swelling x 1 month not improved by steroids/antibiotics, found to have likely nodal metastatic disease on CT neck (07/13/19) - IR has been asked to perform an image guided biopsy for further evaluation.  Patient has been NPO since midnight, he does not take blood thinning medications.   Risks and benefits of right cervical lymph node biopsy was discussed with the patient and/or patient's family including, but not limited to bleeding, infection, damage to adjacent structures or low yield requiring additional tests.  All of the questions were answered and there is agreement to proceed.  Consent signed and in chart.   Thank you for this interesting consult.  I greatly enjoyed meeting Nelly Devers Sr. and look forward to participating in their care.  A copy of this report was sent to the requesting provider on this date.  Electronically Signed: Joaquim Nam, PA-C 07/26/2019, 7:36 AM   I spent a total of 30 Minutes in face to face in clinical consultation, greater than 50% of which was counseling/coordinating care for right cervical lymph node biopsy.

## 2019-07-26 NOTE — Discharge Instructions (Addendum)
Moderate Conscious Sedation, Adult Sedation is the use of medicines to promote relaxation and relieve discomfort and anxiety. Moderate conscious sedation is a type of sedation. Under moderate conscious sedation, you are less alert than normal, but you are still able to respond to instructions, touch, or both. Moderate conscious sedation is used during short medical and dental procedures. It is milder than deep sedation, which is a type of sedation under which you cannot be easily woken up. It is also milder than general anesthesia, which is the use of medicines to make you unconscious. Moderate conscious sedation allows you to return to your regular activities sooner. Tell a health care provider about:  Any allergies you have.  All medicines you are taking, including vitamins, herbs, eye drops, creams, and over-the-counter medicines.  Use of steroids (by mouth or creams).  Any problems you or family members have had with sedatives and anesthetic medicines.  Any blood disorders you have.  Any surgeries you have had.  Any medical conditions you have, such as sleep apnea.  Whether you are pregnant or may be pregnant.  Any use of cigarettes, alcohol, marijuana, or street drugs. What are the risks? Generally, this is a safe procedure. However, problems may occur, including:  Getting too much medicine (oversedation).  Nausea.  Allergic reaction to medicines.  Trouble breathing. If this happens, a breathing tube may be used to help with breathing. It will be removed when you are awake and breathing on your own.  Heart trouble.  Lung trouble. What happens before the procedure? Staying hydrated Follow instructions from your health care provider about hydration, which may include:  Up to 2 hours before the procedure - you may continue to drink clear liquids, such as water, clear fruit juice, black coffee, and plain tea. Eating and drinking restrictions Follow instructions from your  health care provider about eating and drinking, which may include:  8 hours before the procedure - stop eating heavy meals or foods such as meat, fried foods, or fatty foods.  6 hours before the procedure - stop eating light meals or foods, such as toast or cereal.  6 hours before the procedure - stop drinking milk or drinks that contain milk.  2 hours before the procedure - stop drinking clear liquids. Medicine Ask your health care provider about:  Changing or stopping your regular medicines. This is especially important if you are taking diabetes medicines or blood thinners.  Taking medicines such as aspirin and ibuprofen. These medicines can thin your blood. Do not take these medicines before your procedure if your health care provider instructs you not to.  Tests and exams  You will have a physical exam.  You may have blood tests done to show: ? How well your kidneys and liver are working. ? How well your blood can clot. General instructions  Plan to have someone take you home from the hospital or clinic.  If you will be going home right after the procedure, plan to have someone with you for 24 hours. What happens during the procedure?  An IV tube will be inserted into one of your veins.  Medicine to help you relax (sedative) will be given through the IV tube.  The medical or dental procedure will be performed. What happens after the procedure?  Your blood pressure, heart rate, breathing rate, and blood oxygen level will be monitored often until the medicines you were given have worn off.  Do not drive for 24 hours. This information is not   intended to replace advice given to you by your health care provider. Make sure you discuss any questions you have with your health care provider. Document Revised: 04/09/2017 Document Reviewed: 08/17/2015 Elsevier Patient Education  2020 [employer] Needle Biopsy, Care After This sheet gives you information about how to care for  yourself after your procedure. Your health care provider may also give you more specific instructions. If you have problems or questions, contact your health care provider. What can I expect after the procedure? After the procedure, it is common to have soreness, bruising, or mild pain at the puncture site. This should go away in a few days. Follow these instructions at home: Needle insertion site care   Wash your hands with soap and water before you change your bandage (dressing). If you cannot use soap and water, use hand sanitizer.  Follow instructions from your health care provider about how to take care of your puncture site. This includes: ? When and how to change your dressing. ? When to remove your dressing.  Check your puncture site every day for signs of infection. Check for: ? Redness, swelling, or pain. ? Fluid or blood. ? Pus or a bad smell. ? Warmth. General instructions  Return to your normal activities as told by your health care provider. Ask your health care provider what activities are safe for you.  Do not take baths, swim, or use a hot tub until your health care provider approves. Ask your health care provider if you may take showers. You may only be allowed to take sponge baths.  Take over-the-counter and prescription medicines only as told by your health care provider.  Keep all follow-up visits as told by your health care provider. This is important. Contact a health care provider if:  You have a fever.  You have redness, swelling, or pain at the puncture site that lasts longer than a few days.  You have fluid, blood, or pus coming from your puncture site.  Your puncture site feels warm to the touch. Get help right away if:  You have severe bleeding from the puncture site. Summary  After the procedure, it is common to have soreness, bruising, or mild pain at the puncture site. This should go away in a few days.  Check your puncture site every day for  signs of infection, such as redness, swelling, or pain.  Get help right away if you have severe bleeding from your puncture site. This information is not intended to replace advice given to you by your health care provider. Make sure you discuss any questions you have with your health care provider. Document Revised: 07/09/2017 Document Reviewed: 05/10/2017 Elsevier Patient Education  2020 Else[employer] Inc.  

## 2019-07-26 NOTE — Sedation Documentation (Signed)
Called to give report. Nurse unavailable. Will call back 

## 2019-07-31 LAB — SURGICAL PATHOLOGY

## 2019-08-01 ENCOUNTER — Other Ambulatory Visit: Payer: Self-pay | Admitting: Otolaryngology

## 2019-08-01 ENCOUNTER — Telehealth: Payer: Self-pay | Admitting: *Deleted

## 2019-08-01 ENCOUNTER — Other Ambulatory Visit (HOSPITAL_COMMUNITY): Payer: Self-pay | Admitting: Otolaryngology

## 2019-08-01 ENCOUNTER — Ambulatory Visit (HOSPITAL_COMMUNITY): Payer: 59

## 2019-08-01 DIAGNOSIS — R590 Localized enlarged lymph nodes: Secondary | ICD-10-CM

## 2019-08-01 NOTE — Telephone Encounter (Addendum)
Oncology Nurse Navigator Documentation  Placed introductory call to new referral patient Mr. Velna Hatchet.  He was joined by his wife via speaker.  Introduced myself as the H&N oncology nurse navigator that works with Drs. Isidore Moos and Maylon Peppers to whom he has been referred by Dr. Erik Obey. He confirmed understanding of referral.  Briefly explained my role as his navigator, provided my contact information.   Confirmed understanding physician appts to be scheduled now that PET is scheduled 4/1.  He voiced understanding I will call him with dates/times.  Confirmed his understanding of [place] location, explained arrival and registration process.  I explained the purpose of a dental evaluation prior to starting RT, indicated he wd be contacted by [facility] to arrange an appt.    I encouraged him to call with questions/concerns as he moves forward with appts and procedures.    He verbalized understanding of information provided, expressed appreciation for my call.  Addendum:  Later called to inform him of 4/2 Telemedicine 8:30 NE/9:00 Consult with Dr. Isidore Moos, 4/7 in person 8:30 Lab/9:00 Consult with Dr. Maylon Peppers.   Navigator Initial Assessment . 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 Currently on FMLA / STD: STD d/t recent carpel tunnel surgery . Living Situation: with wife . Support System: wife . PCP: yes . PCD: yes . Financial Concerns: no . Transportation Needs: no . Sensory Deficits: no . Language Barriers/Interpreter Needed:  no . Ambulation Needs: no . DME Used in Home: no . Psychosocial Needs:  no . Concerns/Needs Understanding Cancer:  addressed/answered by navigator to best of ability . Self-Expressed Needs: no  Gayleen Orem, RN, BSN Head & Neck Oncology Nurse [place] [place] [phone]

## 2019-08-03 ENCOUNTER — Ambulatory Visit: Payer: 59 | Attending: Internal Medicine

## 2019-08-03 DIAGNOSIS — Z23 Encounter for immunization: Secondary | ICD-10-CM

## 2019-08-03 NOTE — Progress Notes (Signed)
   [id number]  Name:  Oluwatobiloba Ginnetti Sr.    MRN: [id number] DOB: [birth year]  08/03/2019  Mr. Salomone was observed post Covid-19 immunization for 15 minutes without incident. He was provided with Vaccine Information Sheet and instruction to access the V-Safe system.   Mr. Yoffe was instructed to call 911 with any severe reactions post vaccine: Marland Kitchen Difficulty breathing  . Swelling of face and throat  . A fast heartbeat  . A bad rash all over body  . Dizziness and weakness   Immunizations Administered    Name Date Dose VIS Date Route   Moderna COVID-19 Vaccine 08/03/2019  8:04 AM 0.5 mL 04/11/2019 Intramuscular   Manufacturer: Moderna   Lot: [id number]   [id number]

## 2019-08-04 NOTE — Progress Notes (Addendum)
Head and Neck Cancer Location of Tumor / Histology:  07/26/19 FINAL MICROSCOPIC DIAGNOSIS: A. RIGHT CERVICAL LYMPH NODE, NEEDLE CORE BIOPSY: - Squamous cell carcinoma, basaloid. - No distinct nodal tissue identified.   Patient presented with symptoms of: Swelling to his right upper neck near the beginning of February.   Biopsies of right cervical lymph node revealed: squamous cell carcinoma.   Nutrition Status Yes No Comments  Weight changes? []  [x]  From anxiety and lack of appetite, but also diet changes from recommendations by Dr. Erik Obey  Swallowing concerns? []  [x]  Occasionally gets choked when swallowing, not new per patient's wife  PEG? []  [x]     Referrals Yes No Comments  Social Work? []  [x]    Dentistry? [x]  []  Dr. Isac Caddy patient meeting next Wed 08/16/19  Swallowing therapy? []  [x]    Nutrition? []  [x]    Med/Onc? [x]  []  Dr. Maylon Peppers 08/16/19   Safety Issues Yes No Comments  Prior radiation? []  []    Pacemaker/ICD? []  []    Possible current pregnancy? []  []    Is the patient on methotrexate? []  []     Tobacco/Marijuana/Snuff/ETOH use: He has never smoked. He does not drink alcohol.   Past/Anticipated interventions by otolaryngology, if any: [id number] Dr. 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 Plan: I discussed the next steps, including a PET scan, evaluation with medical oncology, radiation oncology, and Dr. Lawana Chambers DDS. . Depending on whether we can identify a primary, he might be a candidate for a TORS procedure with Dr. Nicolette Bang. I introduced him to Dr. Constance Holster today who will assume his care after my retirement.   He declined the need for any pain medication. He did ask for something for sleep. I would like him to start with Benadryl over-the-counter. He has taken Sonata in the past with good tolerance.  Lilyan Gilford, MD   He is scheduled to see Dr. Constance Holster on 08/29/19  Past/Anticipated interventions by medical oncology, if any:   Scheduled to see Dr. Maylon Peppers on 08/16/19  Current Complaints / other details:   08/10/19 PET scan IMPRESSION: 1. The 2.8 cm right level IIa lymph node has a maximum SUV of 28.4. A primary lesion is not readily seen. There is some asymmetric palatine tonsillar activity with the left tonsil having maximum SUV of 12.4 and the right 7.8, but no tonsillar mass is observed on the CT data. 2. No compelling findings of active malignancy in the chest, abdomen/pelvis, or skeleton. There is some accentuated anal activity which is likely physiologic given the lack of visible abnormality on the CT data. 3. Other imaging findings of potential clinical significance: Diffuse hepatic steatosis. Hypodense left renal lesion is probably a cyst but technically too small to characterize  Patient still out of work from carpel tunnel release surgery from 03/2019. He also reports daily headaches that improve after taking 600mg  ibuprofen.

## 2019-08-08 ENCOUNTER — Telehealth: Payer: Self-pay

## 2019-08-08 NOTE — Telephone Encounter (Signed)
Called patient to see if he would be willing to speak with myself and Dr. Isidore Moos sooner for his consult on Friday 08/11/19. Patient stated he was agreeable and was updated on new times (07:30 for nurse elvaluation and 08:00 for provider). No other needs identified at this time.   Appointment times and notes updated in system to reflect above changes.

## 2019-08-10 ENCOUNTER — Other Ambulatory Visit: Payer: Self-pay

## 2019-08-10 ENCOUNTER — Other Ambulatory Visit: Payer: Self-pay | Admitting: Hematology

## 2019-08-10 ENCOUNTER — Encounter (HOSPITAL_COMMUNITY)
Admission: RE | Admit: 2019-08-10 | Discharge: 2019-08-10 | Disposition: A | Discharge status: Home / Self Care | Payer: 59 | Source: Ambulatory Visit | Attending: Otolaryngology | Admitting: Otolaryngology

## 2019-08-10 DIAGNOSIS — R590 Localized enlarged lymph nodes: Secondary | ICD-10-CM | POA: Diagnosis present

## 2019-08-10 DIAGNOSIS — C760 Malignant neoplasm of head, face and neck: Secondary | ICD-10-CM | POA: Insufficient documentation

## 2019-08-10 LAB — GLUCOSE, CAPILLARY: Glucose-Capillary: 78 mg/dL (ref 70–99)

## 2019-08-10 MED ORDER — FLUDEOXYGLUCOSE F - 18 (FDG) INJECTION
15.0000 | Freq: Once | INTRAVENOUS | Status: AC | PRN
  Administered 2019-08-10: 15 via INTRAVENOUS

## 2019-08-10 NOTE — Progress Notes (Signed)
Radiation Oncology         [phone] ________________________________  Initial outpatient Consultation by telephone.  The patient opted for telemedicine to maximize safety during the pandemic.  MyChart video was not obtainable.   Name: Dustin Yoshikawa Sr. MRN: [id number]  Date: 08/11/2019  DOB: 10-17-1965  [id number], Londell Moh, PA-C  Jodi Marble, MD   REFERRING PHYSICIAN: Jodi Marble, MD  DIAGNOSIS:    ICD-10-CM   1. Squamous cell carcinoma of head and neck (HCC)  C76.0   Cancer Staging Squamous cell carcinoma of head and neck (HCC) Staging form: Pharynx - HPV-Mediated Oropharynx, AJCC 8th Edition - Clinical stage from 08/11/2019: Stage I (cT0, cN1, cM0, p16+) - Signed by Eppie Gibson, MD on 08/12/2019    CHIEF COMPLAINT: Here to discuss management of neck cancer  HISTORY OF PRESENT ILLNESS::Dustin Sevcik Sr. is a 54 y.o. male who presented with swelling in his right upper neck. He was seen by an oral surgeon and was treated with antibiotics for parotitis.   Subsequently, the patient saw Dr. Erik Obey who ordered CT scan. Performed on 07/13/2019, neck CT showed: 4.1 cm necrotic/cystic node or nodal conglomerate at right level 2 station likely reflecting nodal metastatic disease; no definite primary mass identified.  Biopsy of the right cervical lymph node on 07/26/2019 revealed: squamous cell carcinoma, basaloid, p16 positive; no distinct nodal tissue identified.  Pertinent imaging thus far includes PET scan performed yesterday, 08/10/2019, revealing: 2.8 cm right level 2a lymph node with maximum SUV of 28.4; primary lesion not readily seen, some asymmetric palatine tonsillar activity with left tonsil having SUV of 12.4 and right 7.8 but no tonsillar mass observed; no compelling findings of active malignancy in chest, abdomen/pelvis, or skeleton.   To my eye there are at least one or two other smaller hypermetabolic nodes adjacent to this dominant mass.  She is scheduled to meet with Dr.  Maylon Peppers on 08/16/2019 and Dr. Constance Holster on 08/29/2019.  Swallowing issues, if any: none new - occasionally gets choked up per wife  Weight Changes: some loss due to anxiety and lack of appetite and diet change  He has right neck pain radiating to his head.  Tobacco history, if any: never smoker, remote minimal chewing tobacco use  ETOH abuse, if any: none  Prior cancers, if any: none  His brother died of cancer 1 month ago. It has been a very difficult year thus far.  PREVIOUS RADIATION THERAPY: No  PAST MEDICAL HISTORY:  has a past medical history of Change in stool (02/22/2017), Gout (02/22/2017), and Sleep apnea.    PAST SURGICAL HISTORY: Past Surgical History:  Procedure Laterality Date  . CARPAL TUNNEL RELEASE Right 04/10/2019    FAMILY HISTORY: family history includes COPD in his mother; Lung cancer in his brother and maternal grandfather; Stomach cancer in his paternal grandmother; Uterine cancer in his mother.  SOCIAL HISTORY:  reports that he has never smoked. He has never used smokeless tobacco. He reports that he does not drink alcohol or use drugs.  ALLERGIES: Patient has no known allergies.  MEDICATIONS:  Current Outpatient Medications  Medication Sig Dispense Refill  . Ascorbic Acid (VITAMIN C) 1000 MG tablet Take 1,000 mg by mouth daily.    . EQ LORATADINE PO Take 10 mg by mouth.    . Grape Seed 100 MG CAPS Take 150 mg by mouth.    . Ibuprofen 200 MG CAPS Take by mouth.    . omega-3 fish oil (MAXEPA) 1000 MG CAPS capsule Take  1 capsule by mouth daily.    . vitamin B-12 (CYANOCOBALAMIN) 1000 MCG tablet Take 1,000 mcg by mouth daily.    Marland Kitchen zolpidem (AMBIEN) 10 MG tablet Take 10 mg by mouth daily.    . Colchicine (MITIGARE) 0.6 MG CAPS Take 2 capsules at onset of flare. Then take 1 capsule 1 hour later.  May take 1 capsule up to twice daily thereafter. (Patient not taking: Reported on 08/11/2019) 60 capsule 2   No current facility-administered medications for this  encounter.    REVIEW OF SYSTEMS:  Notable for that above.   PHYSICAL EXAM:  vitals were not taken for this visit.   General: Alert and oriented, in no acute distress   LABORATORY DATA:  Lab Results  Component Value Date   WBC 7.7 04/03/2019   HGB 15.5 04/03/2019   HCT 45.4 04/03/2019   MCV 90 04/03/2019   PLT 253 04/03/2019   CMP     Component Value Date/Time   NA 140 04/03/2019 1138   K 4.3 04/03/2019 1138   CL 101 04/03/2019 1138   CO2 26 04/03/2019 1138   GLUCOSE 79 04/03/2019 1138   BUN 15 04/03/2019 1138   CREATININE 1.09 04/03/2019 1138   CALCIUM 9.2 04/03/2019 1138   PROT 7.3 04/03/2019 1138   ALBUMIN 4.3 04/03/2019 1138   AST 24 04/03/2019 1138   ALT 32 04/03/2019 1138   ALKPHOS 92 04/03/2019 1138   BILITOT 0.3 04/03/2019 1138   GFRNONAA 77 04/03/2019 1138   GFRAA 88 04/03/2019 1138      Lab Results  Component Value Date   TSH 2.930 04/03/2019     RADIOGRAPHY: CT SOFT TISSUE NECK W CONTRAST  Result Date: 07/13/2019 CLINICAL DATA:  Acute parotitis. Additional history provided: Right-sided facial/neck swelling for 3-4 weeks. EXAM: CT NECK WITH CONTRAST TECHNIQUE: Multidetector CT imaging of the neck was performed using the standard protocol following the bolus administration of intravenous contrast. CONTRAST:  58mL ISOVUE-300 IOPAMIDOL (ISOVUE-300) INJECTION 61% COMPARISON:  No pertinent prior studies available for comparison. FINDINGS: Pharynx and larynx: Streak artifact from dental restoration limits evaluation of the oral cavity. Within this limitation, no swelling or definite discrete mass is identified within the oral cavity, pharynx or larynx. Salivary glands: No inflammation, mass, or stone. Thyroid: Unremarkable. Lymph nodes: There is an enlarged right level II node or nodal conglomerate measuring 4.1 x 3.2 cm (series 2, image 46) (series 10, image 60). There is ill-defined hypodensity centrally consistent with necrotic and or cystic change. No other  pathologically enlarged cervical chain lymph node is identified. Vascular: The major vascular structures of the neck appear patent. Limited intracranial: No abnormality identified. Visualized orbits: Excluded from the field of view. Mastoids and visualized paranasal sinuses: No significant paranasal sinus disease or mastoid effusion at the imaged levels. Skeleton: No acute bony abnormality or aggressive osseous lesion. Cervical spondylosis with multilevel posterior disc osteophytes, uncovertebral and facet hypertrophy. A prominent posterior disc osteophyte complex at C6-C7 contributes to at least moderate bony spinal canal stenosis. Multilevel neural foraminal narrowing. Upper chest: No consolidation within the imaged lung apices. These results will be called to the ordering clinician or representative by the Radiologist Assistant, and communication documented in the PACS or zVision Dashboard. IMPRESSION: 4.1 x 3.2 cm necrotic/cystic node or nodal conglomerate at the right level II station likely reflecting nodal metastatic disease. No definite primary mass is identified within the oral cavity, pharynx or larynx. Correlate with direct visualization and consider direct tissue sampling. Additionally, PET-CT  may be helpful. Cervical spondylosis as described. A prominent C6-C7 posterior disc osteophyte contributes to at least moderate bony spinal canal stenosis. Multilevel neural foraminal narrowing. Electronically Signed   By: Kellie Simmering DO   On: 07/13/2019 11:39   NM PET Image Initial (PI) Skull Base To Thigh  Result Date: 08/10/2019 CLINICAL DATA:  Initial treatment strategy for squamous cell carcinoma of the head/neck involving a right cervical lymph node EXAM: NUCLEAR MEDICINE PET SKULL BASE TO THIGH TECHNIQUE: 15.0 mCi F-18 FDG was injected intravenously. Full-ring PET imaging was performed from the skull base to thigh after the radiotracer. CT data was obtained and used for attenuation correction and  anatomic localization. Fasting blood glucose: 78 mg/dl COMPARISON:  CT neck 07/13/2019 FINDINGS: Mediastinal blood pool activity: SUV max 3.3 Liver activity: SUV max 4.2 NECK: Right level IIa lymph node measures 2.8 cm in short axis on image 33/4 and has a maximum SUV of 28.4. A small adjacent level IIb lymph node measuring 0.7 cm in short axis on image 32/4 has a maximum SUV of 5.4. Accentuated and mildly asymmetric palatine tonsillar activity, left tonsil with maximum SUV 12.4 and right tonsil with maximum SUV 7.8 on the CT data I do not observe a definite tonsillar mass. No hypermetabolic glottic activity. Incidental CT findings: none CHEST: A thin right axillary lymph node measuring about 3 mm in short axis on image 77/4 has a maximum SUV of 4.0, mildly above blood pool. Incidental CT findings: No significant atherosclerosis. Probable sebaceous cyst in the presternal region on image 87/4. ABDOMEN/PELVIS: Physiologic activity in segments of bowel. Accentuated anal activity is probably physiologic given the lack of a visible anal lesion on the CT data, maximum SUV 11.0. No current splenomegaly. Incidental CT findings: Diffuse hepatic steatosis. A hypodense 1.2 cm lesion of the left mid kidney posteriorly on image 125/4 appears relatively photopenic compared to the renal parenchyma on the PET data but is not entirely specific. This lesion has an internal density of about 15 Hounsfield units and was also present on 06/22/2016. SKELETON: No significant abnormal hypermetabolic activity in this region. Incidental CT findings: none IMPRESSION: 1. The 2.8 cm right level IIa lymph node has a maximum SUV of 28.4. A primary lesion is not readily seen. There is some asymmetric palatine tonsillar activity with the left tonsil having maximum SUV of 12.4 and the right 7.8, but no tonsillar mass is observed on the CT data. 2. No compelling findings of active malignancy in the chest, abdomen/pelvis, or skeleton. There is some  accentuated anal activity which is likely physiologic given the lack of visible abnormality on the CT data. 3. Other imaging findings of potential clinical significance: Diffuse hepatic steatosis. Hypodense left renal lesion is probably a cyst but technically too small to characterize. Electronically Signed   By: Van Clines M.D.   On: 08/10/2019 15:54   [place] CORE BIOPSY (LYMPH NODES)  Result Date: 07/26/2019 INDICATION: 54 year old with a suspicious right neck mass or lymphadenopathy. Tissue diagnosis is needed. EXAM: ULTRASOUND-GUIDED RIGHT NECK LYMPH NODE BIOPSY MEDICATIONS: None. ANESTHESIA/SEDATION: Moderate (conscious) sedation was employed during this procedure. A total of Versed 2.0 mg and Fentanyl 100 mcg was administered intravenously. Moderate Sedation Time: 16 minutes. The patient's level of consciousness and vital signs were monitored continuously by radiology nursing throughout the procedure under my direct supervision. FLUOROSCOPY TIME:  None COMPLICATIONS: None immediate. PROCEDURE: Informed written consent was obtained from the patient after a thorough discussion of the procedural risks, benefits and alternatives.  All questions were addressed. A timeout was performed prior to the initiation of the procedure. Ultrasound was used to evaluate the right neck. The right neck lesion was identified. The skin was prepped with chlorhexidine and sterile field was created. Skin and soft tissues were anesthetized with 1% lidocaine. Using ultrasound guidance, an 18 gauge core biopsy needle was directed into the lesion. Total of 5 core biopsies were obtained. Specimens placed in saline. Bandage placed over the puncture site. FINDINGS: Solid mildly lobulated lesion in the right upper neck just deep to the right sternocleidomastoid muscle. Lesion corresponds with the abnormality seen on prior CT. Five core biopsies were obtained. Adequate specimens were obtained. No significant bleeding or hematoma  formation. IMPRESSION: Successful ultrasound-guided right cervical lymph node/lesion biopsy. Electronically Signed   By: Markus Daft M.D.   On: 07/26/2019 10:05      IMPRESSION/PLAN:  This is a delightful patient with head and neck cancer, unknown primary.  I've asked Gayleen Orem, RN, our Head and Neck Oncology Navigator to order EBV testing. This is p16+.  I have asked Dr. Constance Holster to try to work him in ASAP for pandendoscopy and biopsies to see if a primary can be found.  His optional for curative therapy are:  1) Surgery to neck --> RT to throat and neck (with low chance of chemotherapy being recommended due to extra nodal extension, if found).  2) Surgery to neck and possible TORS if primary is found upon biopsies of throat (unlikely); he would probably need adjuvant RT  3) Concurrent Chemoradiation  4) Radiation alone (less aggressive option with lower chance of cure)  We discussed the potential risks, benefits, and side effects of radiotherapy. We talked in detail about acute and late effects. We discussed that some of the most bothersome acute effects may be mucositis, dysgeusia, salivary changes, skin irritation, hair loss, dehydration, weight loss and fatigue. We talked about late effects which include but are not necessarily limited to dysphagia, xerostomia, hair loss, and neck edema. No guarantees of treatment were given. The patient is enthusiastic about proceeding with treatment. I look forward to participating in the patient's care.   We also discussed that the treatment of head and neck cancer is a multidisciplinary process to maximize treatment outcomes and quality of life. For this reason the following referrals have been or will be made:   Medical oncology to discuss chemotherapy    Dentistry for dental evaluation, procedures / advice on reducing risk of cavities, osteoradionecrosis, or other oral issues.   Nutritionist for nutrition support during and after treatment.    Speech language pathology for swallowing and/or speech therapy.   Social work for social support. (Recent loss of brother to cancer.)   Physical therapy due to risk of lymphedema in neck and deconditioning.   This encounter was provided by telemedicine platform by telephone.  The patient opted for telemedicine to maximize safety during the pandemic.  MyChart video was not obtainable..  The patient has given verbal consent for this type of encounter and has been advised to only accept a meeting of this type in a secure network environment. On date of service, in total, time spent during this encounter was 60 minutes. The attendants for this meeting include Eppie Gibson  and Pandora Leiter RiosGayleen Orem, RN, our Head and Neck Oncology Navigator And [facility] RN were also present. During the encounter, Eppie Gibson was located at [facility] Radiation Oncology Department.  [name] was  located at home.   __________________________________________   Eppie Gibson, MD   This document serves as a record of services personally performed by Eppie Gibson, MD. It was created on her behalf by Wilburn Mylar, a trained medical scribe. The creation of this record is based on the scribe's personal observations and the provider's statements to them. This document has been checked and approved by the attending provider.

## 2019-08-10 NOTE — Progress Notes (Signed)
[place] NOTE  Patient Care Team: Theodoro Clock as PCP - General (Physician Assistant) Jodi Marble, MD as Consulting Physician (Otolaryngology) Eppie Gibson, MD as Attending Physician (Radiation Oncology) Tish Men, MD as Consulting Physician (Hematology) Leota Sauers, RN as Oncology Nurse Navigator [place], Stephani Police, RN as Oncology Nurse Navigator (Oncology)  HEME/ONC OVERVIEW: 1. 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 Erik Obey of ENT for history of otitis externa, and presented to his clinic for evaluation in late 06/2019 for right neck swelling.  Patient had apparently been evaluated by his dentist and oral surgeon prior to the ENT visit, and was recommended supportive care for possible parotitis.  CT neck showed a large right Level II nodal conglomerate measuring approximately 4 cm without any definite primary head and neck malignancy.  He underwent right neck biopsy, which showed squamous cell carcinoma, p16 positive.  PET showed FDG-avid R Level II LN with some tonsil asymmetry, but there was no definite primary malignancy or metastatic disease.  Patient was referred to oncology for further evaluation. -I reviewed the imaging and the pathology results in detail with patient, as well as NCCN guideline -We discussed that management of head and neck malignancies requires a multidisciplinary approach,  including ENT, radiation oncology, and medical oncology -Patient is scheduled to see ENT on 08/17/2019, and I discussed the case with Dr. Constance Holster in detail.  He plans to perform bilateral tonsillectomy to assess for any primary malignancy, given the mild FDG avidity on PET.  Pending the biopsy results, we will determine if the patient should proceed with surgical resection (ie neck dissection) vs definitive chemoradiation.  R neck pain -Likely secondary to malignancy in the LN -Currently on PRN ibuprofen with relief -I counseled the patient on avoiding excessive NSAID use, and consider alternating Tylenol with ibuprofen -If pain is not well controlled, we can prescribe pain medication as needed  Insomnia -Likely caused by anxiety and stress -Currently on PRN Ambien -Due to potential dependency of Ambien, I have prescribed PRN trazodone; patient understands not to take Ambien and trazodone concurrently   No orders of the defined types were placed in this encounter.  The total time spent in the encounter was 65 minutes, including face-to-face time with the patient, review of various tests results, order additional studies/medications, documentation, and coordination of care plan.   All questions were answered. The patient knows to call the clinic with any problems, questions or concerns. No barriers to learning was detected.  Return TBD, pending ENT evaluation.   Tish Men, MD 4/7/20219:50 AM  CHIEF COMPLAINTS/PURPOSE OF CONSULTATION:  "I am just stressed out"  HISTORY OF PRESENTING ILLNESS:  Dustin Leiter Sr. 54 y.o. male is here because of newly diagnosed squamous cell carcinoma of the right neck.  Patient has been followed by Dr. Erik Obey of ENT for history of otitis externa, and presented to his clinic for evaluation in late 06/2019 for right neck  swelling.  Patient had apparently been evaluated by his dentist and oral surgeon prior to the ENT visit, and was recommended supportive care for  possible parotitis.  CT neck showed a large right Level II nodal conglomerate measuring approximately 4 cm without any definite primary head and neck malignancy.  He underwent right neck biopsy, which showed squamous cell carcinoma, p16 positive.  PET showed FDG-avid R Level II LN with some tonsil asymmetry, but there was no definite primary malignancy or metastatic disease.  Patient was referred to oncology for further evaluation.  Patient reports that he has periodic right-sided neck pain, for which she has been taking PRN ibuprofen with some relief.  His brother recently passed away from metastatic lung cancer, and he has been dealing with a lot of stress from his brother's death.  His appetite was less, and he reports 10 pound weight loss over the past 3 to 4 weeks.  However, his appetite has recently started to improve.  He also reports insomnia, for which his surgeon had prescribed Ambien, which he has been taking for the past a few days.  He denies any other complaint today.  REVIEW OF SYSTEMS:   Constitutional: ( - ) fevers, ( - )  chills , ( - ) night sweats Eyes: ( - ) blurriness of vision, ( - ) double vision, ( - ) watery eyes Ears, nose, mouth, throat, and face: ( - ) mucositis, ( - ) sore throat Respiratory: ( - ) cough, ( - ) dyspnea, ( - ) wheezes Cardiovascular: ( - ) palpitation, ( - ) chest discomfort, ( - ) lower extremity swelling Gastrointestinal:  ( - ) nausea, ( - ) heartburn, ( - ) change in bowel habits Skin: ( - ) abnormal skin rashes Lymphatics: ( + ) lymphadenopathy, ( - ) easy bruising Neurological: ( - ) numbness, ( - ) tingling, ( - ) new weaknesses Behavioral/Psych: ( - ) mood change, ( - ) new changes  All other systems were reviewed with the patient and are negative.  I have reviewed his chart and materials related to his cancer extensively and collaborated history with the patient. Summary of oncologic history is as follows: Oncology History  Squamous cell  carcinoma of head and neck (HCC)  07/13/2019 Imaging   CT neck: IMPRESSION: 4.1 x 3.2 cm necrotic/cystic node or nodal conglomerate at the right level II station likely reflecting nodal metastatic disease. No definite primary mass is identified within the oral cavity, pharynx or larynx. Correlate with direct visualization and consider direct tissue sampling. Additionally, PET-CT may be helpful.   Cervical spondylosis as described. A prominent C6-C7 posterior disc osteophyte contributes to at least moderate bony spinal canal stenosis. Multilevel neural foraminal narrowing.   07/26/2019 Pathology Results   FINAL MICROSCOPIC DIAGNOSIS:   A. RIGHT CERVICAL LYMPH NODE, NEEDLE CORE BIOPSY:  - Squamous cell carcinoma, basaloid.  - No distinct nodal tissue identified.   P16 immunohistochemistry is POSITIVE.    08/10/2019 Initial Diagnosis   Squamous cell carcinoma of head and neck ([facility])   08/10/2019 Imaging   PET:  IMPRESSION: 1. The 2.8 cm right level IIa lymph node has a maximum SUV of 28.4. A primary lesion is not readily seen. There is some asymmetric palatine tonsillar activity with the left tonsil having maximum SUV of 12.4 and the right 7.8, but no tonsillar mass is observed on the CT data. 2. No compelling findings of active malignancy in the chest, abdomen/pelvis, or skeleton. There is  some accentuated anal activity which is likely physiologic given the lack of visible abnormality on the CT data. 3. Other imaging findings of potential clinical significance: Diffuse hepatic steatosis. Hypodense left renal lesion is probably a cyst but technically too small to characterize.   08/11/2019 Cancer Staging   Staging form: Pharynx - HPV-Mediated Oropharynx, AJCC 8th Edition - Clinical stage from 08/11/2019: Stage I (cT0, cN1, cM0, p16+) - Signed by Eppie Gibson, MD on 08/12/2019     MEDICAL HISTORY:  Past Medical History:  Diagnosis Date  . Change in stool 02/22/2017  . Gout  02/22/2017  . Sleep apnea     SURGICAL HISTORY: Past Surgical History:  Procedure Laterality Date  . CARPAL TUNNEL RELEASE Right 04/10/2019    SOCIAL HISTORY: Social History   Socioeconomic History  . Marital status: Married    Spouse name: Not on file  . Number of children: Not on file  . Years of education: Not on file  . Highest education level: Not on file  Occupational History  . Not on file  Tobacco Use  . Smoking status: Never Smoker  . Smokeless tobacco: Never Used  Substance and Sexual Activity  . Alcohol use: No  . Drug use: No  . Sexual activity: Not on file  Other Topics Concern  . Not on file  Social History Narrative  . Not on file   Social Determinants of Health   Financial Resource Strain:   . Difficulty of Paying Living Expenses:   Food Insecurity:   . Worried About Charity fundraiser in the Last Year:   . Arboriculturist in the Last Year:   Transportation Needs:   . Film/video editor (Medical):   Marland Kitchen Lack of Transportation (Non-Medical):   Physical Activity:   . Days of Exercise per Week:   . Minutes of Exercise per Session:   Stress:   . Feeling of Stress :   Social Connections:   . Frequency of Communication with Friends and Family:   . Frequency of Social Gatherings with Friends and Family:   . Attends Religious Services:   . Active Member of Clubs or Organizations:   . Attends Archivist Meetings:   Marland Kitchen Marital Status:   Intimate Partner Violence:   . Fear of Current or Ex-Partner:   . Emotionally Abused:   Marland Kitchen Physically Abused:   . Sexually Abused:     FAMILY HISTORY: Family History  Problem Relation Age of Onset  . COPD Mother   . Uterine cancer Mother   . Lung cancer Brother   . Lung cancer Maternal Grandfather   . Stomach cancer Paternal Grandmother     ALLERGIES:  has No Known Allergies.  MEDICATIONS:  Current Outpatient Medications  Medication Sig Dispense Refill  . Ascorbic Acid (VITAMIN C) 1000 MG  tablet Take 1,000 mg by mouth daily.    . Colchicine (MITIGARE) 0.6 MG CAPS Take 2 capsules at onset of flare. Then take 1 capsule 1 hour later.  May take 1 capsule up to twice daily thereafter. 60 capsule 2  . EQ LORATADINE PO Take 10 mg by mouth.    . Grape Seed 100 MG CAPS Take 150 mg by mouth.    . Ibuprofen 200 MG CAPS Take by mouth.    . omega-3 fish oil (MAXEPA) 1000 MG CAPS capsule Take 1 capsule by mouth daily.    . vitamin B-12 (CYANOCOBALAMIN) 1000 MCG tablet Take 1,000 mcg by mouth daily.    Marland Kitchen  zolpidem (AMBIEN) 10 MG tablet Take 10 mg by mouth daily.    . traZODone (DESYREL) 50 MG tablet Take 1 tablet (50 mg total) by mouth at bedtime as needed for sleep. 30 tablet 3   No current facility-administered medications for this visit.    PHYSICAL EXAMINATION: ECOG PERFORMANCE STATUS: 0 - Asymptomatic  Vitals:   08/16/19 0841  BP: (!) 148/84  Pulse: 77  Resp: 18  Temp: 98.5 F (36.9 C)  SpO2: 98%   Filed Weights   08/16/19 0841  Weight: (!) 309 lb (140.2 kg)    GENERAL: alert, no distress and comfortable SKIN: skin color, texture, turgor are normal, no rashes or significant lesions EYES: conjunctiva are pink and non-injected, sclera clear OROPHARYNX: no exudate, no erythema; lips, buccal mucosa, and tongue normal  NECK: supple LYMPH:  R Level II LN, measuring ~4x3cm, with mild swelling and tenderness w/ palpation  LUNGS: clear to auscultation with normal breathing effort HEART: regular rate & rhythm, no murmurs, no lower extremity edema ABDOMEN: soft, non-tender, non-distended, normal bowel sounds Musculoskeletal: no cyanosis of digits and no clubbing  PSYCH: alert & oriented x 3, fluent speech  LABORATORY DATA:  I have reviewed the data as listed Lab Results  Component Value Date   WBC 5.0 08/16/2019   HGB 14.4 08/16/2019   HCT 43.7 08/16/2019   MCV 91.8 08/16/2019   PLT 198 08/16/2019   Lab Results  Component Value Date   NA 142 08/16/2019   K 4.3  08/16/2019   CL 105 08/16/2019   CO2 27 08/16/2019    RADIOGRAPHIC STUDIES: I have personally reviewed the radiological images as listed and agreed with the findings in the report. NM PET Image Initial (PI) Skull Base To Thigh  Result Date: 08/10/2019 CLINICAL DATA:  Initial treatment strategy for squamous cell carcinoma of the head/neck involving a right cervical lymph node EXAM: NUCLEAR MEDICINE PET SKULL BASE TO THIGH TECHNIQUE: 15.0 mCi F-18 FDG was injected intravenously. Full-ring PET imaging was performed from the skull base to thigh after the radiotracer. CT data was obtained and used for attenuation correction and anatomic localization. Fasting blood glucose: 78 mg/dl COMPARISON:  CT neck 07/13/2019 FINDINGS: Mediastinal blood pool activity: SUV max 3.3 Liver activity: SUV max 4.2 NECK: Right level IIa lymph node measures 2.8 cm in short axis on image 33/4 and has a maximum SUV of 28.4. A small adjacent level IIb lymph node measuring 0.7 cm in short axis on image 32/4 has a maximum SUV of 5.4. Accentuated and mildly asymmetric palatine tonsillar activity, left tonsil with maximum SUV 12.4 and right tonsil with maximum SUV 7.8 on the CT data I do not observe a definite tonsillar mass. No hypermetabolic glottic activity. Incidental CT findings: none CHEST: A thin right axillary lymph node measuring about 3 mm in short axis on image 77/4 has a maximum SUV of 4.0, mildly above blood pool. Incidental CT findings: No significant atherosclerosis. Probable sebaceous cyst in the presternal region on image 87/4. ABDOMEN/PELVIS: Physiologic activity in segments of bowel. Accentuated anal activity is probably physiologic given the lack of a visible anal lesion on the CT data, maximum SUV 11.0. No current splenomegaly. Incidental CT findings: Diffuse hepatic steatosis. A hypodense 1.2 cm lesion of the left mid kidney posteriorly on image 125/4 appears relatively photopenic compared to the renal parenchyma on the  PET data but is not entirely specific. This lesion has an internal density of about 15 Hounsfield units and was also present  on 06/22/2016. SKELETON: No significant abnormal hypermetabolic activity in this region. Incidental CT findings: none IMPRESSION: 1. The 2.8 cm right level IIa lymph node has a maximum SUV of 28.4. A primary lesion is not readily seen. There is some asymmetric palatine tonsillar activity with the left tonsil having maximum SUV of 12.4 and the right 7.8, but no tonsillar mass is observed on the CT data. 2. No compelling findings of active malignancy in the chest, abdomen/pelvis, or skeleton. There is some accentuated anal activity which is likely physiologic given the lack of visible abnormality on the CT data. 3. Other imaging findings of potential clinical significance: Diffuse hepatic steatosis. Hypodense left renal lesion is probably a cyst but technically too small to characterize. Electronically Signed   By: Van Clines M.D.   On: 08/10/2019 15:54   [place] CORE BIOPSY (LYMPH NODES)  Result Date: 07/26/2019 INDICATION: 54 year old with a suspicious right neck mass or lymphadenopathy. Tissue diagnosis is needed. EXAM: ULTRASOUND-GUIDED RIGHT NECK LYMPH NODE BIOPSY MEDICATIONS: None. ANESTHESIA/SEDATION: Moderate (conscious) sedation was employed during this procedure. A total of Versed 2.0 mg and Fentanyl 100 mcg was administered intravenously. Moderate Sedation Time: 16 minutes. The patient's level of consciousness and vital signs were monitored continuously by radiology nursing throughout the procedure under my direct supervision. FLUOROSCOPY TIME:  None COMPLICATIONS: None immediate. PROCEDURE: Informed written consent was obtained from the patient after a thorough discussion of the procedural risks, benefits and alternatives. All questions were addressed. A timeout was performed prior to the initiation of the procedure. Ultrasound was used to evaluate the right neck. The right  neck lesion was identified. The skin was prepped with chlorhexidine and sterile field was created. Skin and soft tissues were anesthetized with 1% lidocaine. Using ultrasound guidance, an 18 gauge core biopsy needle was directed into the lesion. Total of 5 core biopsies were obtained. Specimens placed in saline. Bandage placed over the puncture site. FINDINGS: Solid mildly lobulated lesion in the right upper neck just deep to the right sternocleidomastoid muscle. Lesion corresponds with the abnormality seen on prior CT. Five core biopsies were obtained. Adequate specimens were obtained. No significant bleeding or hematoma formation. IMPRESSION: Successful ultrasound-guided right cervical lymph node/lesion biopsy. Electronically Signed   By: Markus Daft M.D.   On: 07/26/2019 10:05    PATHOLOGY: I have reviewed the pathology reports as documented in the oncologist history.

## 2019-08-11 ENCOUNTER — Other Ambulatory Visit: Payer: Self-pay

## 2019-08-11 ENCOUNTER — Ambulatory Visit
Admission: RE | Admit: 2019-08-11 | Discharge: 2019-08-11 | Disposition: A | Discharge status: Home / Self Care | Payer: 59 | Source: Ambulatory Visit | Attending: Radiation Oncology | Admitting: Radiation Oncology

## 2019-08-11 ENCOUNTER — Encounter: Payer: Self-pay | Admitting: Radiation Oncology

## 2019-08-11 DIAGNOSIS — C760 Malignant neoplasm of head, face and neck: Secondary | ICD-10-CM

## 2019-08-11 NOTE — Progress Notes (Signed)
Oncology Nurse Navigator Documentation  Met with patient during initial telephone consult with Dr. Isidore Moos.  He was joined with his wife [name] at home. . Further introduced myself as his Navigator, explained my role as a member of the Care Team.   . Provided introductory explanation of radiation treatment including SIM planning and purpose of Aquaplast head and shoulder mask.     I encouraged them to contact me with questions/concerns as treatments/procedures begin.  They verbalized understanding of information provided.    Harlow Asa RN, BSN Head & Neck Oncology Nurse [place] [facility] [place] [phone]

## 2019-08-12 ENCOUNTER — Other Ambulatory Visit: Payer: Self-pay | Admitting: Radiation Oncology

## 2019-08-12 ENCOUNTER — Encounter: Payer: Self-pay | Admitting: Radiation Oncology

## 2019-08-12 DIAGNOSIS — C760 Malignant neoplasm of head, face and neck: Secondary | ICD-10-CM

## 2019-08-12 NOTE — Progress Notes (Signed)
Dental Form with Estimates of Radiation Dose     Diagnosis:    ICD-10-CM   1. Squamous cell carcinoma of head and neck (HCC)  C76.0 Ambulatory referral to Social Work    Ambulatory referral to Physical Therapy    Amb Referral to Nutrition and Diabetic E    Referral to Neuro Rehab    Ambulatory referral to Dentistry    Prognosis: curable  Anticipated # of fractions: 30-35    Daily?: yes  # of weeks of radiotherapy: 6-7  Chemotherapy?: ?  Anticipated xerostomia:  Mild permanent   Pre-simulation needs:   Scatter protection   Simulation: ? Depends on whether surgery is done upfront  Other Notes:   Having technical issues w/ pasting port drawing into this document - will email to Dr Enrique Sack. 50Gy line anticipated to avoid all tooth roots.  Please contact Eppie Gibson, MD, with patient's disposition after evaluation and/or dental treatment.

## 2019-08-14 ENCOUNTER — Encounter: Payer: Self-pay | Admitting: *Deleted

## 2019-08-14 ENCOUNTER — Telehealth: Payer: Self-pay | Admitting: Nutrition

## 2019-08-14 NOTE — Telephone Encounter (Signed)
Scheduled appt per 4/5 sch message -unable to reach pt . Left message with appt date and time   

## 2019-08-14 NOTE — Progress Notes (Signed)
[facility] Initial Psychosocial Assessment Clinical Social Work  Clinical Social Work received referral from radiation oncology.  CSW contacted patient by phone to assess psychosocial, emotional, mental health, and spiritual needs of the patient.   Barriers to care/review of distress screen:  - Transportation:  Do you anticipate any problems getting to appointments?  Patient and patients wife are able to provide their own transportation.  Patient stated it was a 40 minute drive each way to [facility] from his home.  Patient has requested that appointments be coordinated to ease the travel.  CSW and patient also discussed the sherrill fund to assist with transportation cost.  CSW plans to meet with patient at [facility] on 08/23/19 - Help at home:  What is your living situation (alone, family, other)?    Patient lives at home with his wife.  - Support system:  What does your support system look like?  Who would you call on if you needed some kind of practical help?  What if you needed someone to talk to for emotional support?  Patient described a large support system in his wife, family, church family and pastor.  Patient and his wife care for their grandchildren during the day, and have worked out a plan to help during treatment.  Patient recently lost his brother to cancer, and has been connected with grief counseling/support through his church.   - Finances:  Are you concerned about finances.  Considering returning to work?  If not, applying for disability?  Additional transportation cost.  Meeting with CSW on 4/14 to enroll in the sherrill fund.  What is your understanding of where you are with your cancer? Its cause?  Your treatment plan and what happens next?  Patient described feeling overwhelmed, but confident in his treatment plan.  CSW and patient discussed common and appropriate feelings and emotions when being diagnosed with cancer and the importance of support.  CSW provided education on CSW role and  information on the support team and support services at [facility].  CSW provided contact information and encouraged patient and patients wife to call with questions or concerns.     Johnnye Lana, MSW, LCSW, OSW-C Clinical Social Worker [facility] [phone]

## 2019-08-16 ENCOUNTER — Encounter: Payer: Self-pay | Admitting: Hematology

## 2019-08-16 ENCOUNTER — Encounter (HOSPITAL_BASED_OUTPATIENT_CLINIC_OR_DEPARTMENT_OTHER): Payer: Self-pay | Admitting: Otolaryngology

## 2019-08-16 ENCOUNTER — Inpatient Hospital Stay: Payer: 59 | Attending: Hematology | Admitting: Hematology

## 2019-08-16 ENCOUNTER — Inpatient Hospital Stay: Payer: 59

## 2019-08-16 ENCOUNTER — Other Ambulatory Visit: Payer: Self-pay

## 2019-08-16 ENCOUNTER — Other Ambulatory Visit (HOSPITAL_COMMUNITY): Payer: 59 | Admitting: Dentistry

## 2019-08-16 ENCOUNTER — Encounter (INDEPENDENT_AMBULATORY_CARE_PROVIDER_SITE_OTHER): Payer: Self-pay

## 2019-08-16 VITALS — BP 148/84 | HR 77 | Temp 98.5°F | Resp 18 | Ht 68.0 in | Wt 309.0 lb

## 2019-08-16 DIAGNOSIS — N289 Disorder of kidney and ureter, unspecified: Secondary | ICD-10-CM | POA: Diagnosis not present

## 2019-08-16 DIAGNOSIS — R634 Abnormal weight loss: Secondary | ICD-10-CM | POA: Insufficient documentation

## 2019-08-16 DIAGNOSIS — G4700 Insomnia, unspecified: Secondary | ICD-10-CM | POA: Insufficient documentation

## 2019-08-16 DIAGNOSIS — C760 Malignant neoplasm of head, face and neck: Secondary | ICD-10-CM | POA: Insufficient documentation

## 2019-08-16 DIAGNOSIS — Z79899 Other long term (current) drug therapy: Secondary | ICD-10-CM | POA: Diagnosis not present

## 2019-08-16 DIAGNOSIS — K760 Fatty (change of) liver, not elsewhere classified: Secondary | ICD-10-CM | POA: Insufficient documentation

## 2019-08-16 DIAGNOSIS — G4730 Sleep apnea, unspecified: Secondary | ICD-10-CM | POA: Insufficient documentation

## 2019-08-16 DIAGNOSIS — M542 Cervicalgia: Secondary | ICD-10-CM | POA: Diagnosis not present

## 2019-08-16 LAB — CBC WITH DIFFERENTIAL (CANCER CENTER ONLY)
Abs Immature Granulocytes: 0.02 10*3/uL (ref 0.00–0.07)
Basophils Absolute: 0 10*3/uL (ref 0.0–0.1)
Basophils Relative: 1 %
Eosinophils Absolute: 0.1 10*3/uL (ref 0.0–0.5)
Eosinophils Relative: 3 %
HCT: 43.7 % (ref 39.0–52.0)
Hemoglobin: 14.4 g/dL (ref 13.0–17.0)
Immature Granulocytes: 0 %
Lymphocytes Relative: 35 %
Lymphs Abs: 1.7 10*3/uL (ref 0.7–4.0)
MCH: 30.3 pg (ref 26.0–34.0)
MCHC: 33 g/dL (ref 30.0–36.0)
MCV: 91.8 fL (ref 80.0–100.0)
Monocytes Absolute: 0.4 10*3/uL (ref 0.1–1.0)
Monocytes Relative: 9 %
Neutro Abs: 2.6 10*3/uL (ref 1.7–7.7)
Neutrophils Relative %: 52 %
Platelet Count: 198 10*3/uL (ref 150–400)
RBC: 4.76 MIL/uL (ref 4.22–5.81)
RDW: 13 % (ref 11.5–15.5)
WBC Count: 5 10*3/uL (ref 4.0–10.5)
nRBC: 0 % (ref 0.0–0.2)

## 2019-08-16 LAB — CMP (CANCER CENTER ONLY)
ALT: 24 U/L (ref 0–44)
AST: 19 U/L (ref 15–41)
Albumin: 3.7 g/dL (ref 3.5–5.0)
Alkaline Phosphatase: 65 U/L (ref 38–126)
Anion gap: 10 (ref 5–15)
BUN: 19 mg/dL (ref 6–20)
CO2: 27 mmol/L (ref 22–32)
Calcium: 8.8 mg/dL — ABNORMAL LOW (ref 8.9–10.3)
Chloride: 105 mmol/L (ref 98–111)
Creatinine: 1.11 mg/dL (ref 0.61–1.24)
GFR, Est AFR Am: >60 mL/min (ref >60)
GFR, Estimated: >60 mL/min (ref >60)
Glucose, Bld: 112 mg/dL — ABNORMAL HIGH (ref 70–99)
Potassium: 4.3 mmol/L (ref 3.5–5.1)
Sodium: 142 mmol/L (ref 135–145)
Total Bilirubin: 0.5 mg/dL (ref 0.3–1.2)
Total Protein: 7.1 g/dL (ref 6.5–8.1)

## 2019-08-16 MED ORDER — TRAZODONE HCL 50 MG PO TABS: 50.0000 mg | ORAL_TABLET | Freq: Every evening | ORAL | 3 refills | Status: DC | PRN

## 2019-08-16 NOTE — Progress Notes (Signed)
A user error has taken place.

## 2019-08-16 NOTE — Progress Notes (Addendum)
Oncology Nurse Navigator Documentation  Met with patient during initial consult with Dr. Maylon Peppers. He was accompanied by . Further introduced myself as his/their Navigator, explained my role as a member of the Care Team. . Provided New Patient Information packet: o Contact information for physician, this navigator, other members of the Care Team o Advance Directive information ([place] blue pamphlet with LCSW insert); provided [facility] AD booklet at his request, encouraged him to contact Gwinda Maine, LCSW, to complete. o Fall Prevention Patient [place] sheet o Symptom Management Clinic information o [facility] campus map with highlight of [place] o SLP Information sheet . Provided and discussed educational handouts for PEG and PAC. Marland Kitchen Showed example of aquaplast mask that will be fitted during future CT Simulation appointment.  . Assisted with post-consult appt scheduling. . Showed them the location of Dr. Ritta Slot office and [facility] as reference for future appts, including arrival procedure for these appts.   . They verbalized understanding of information provided. . I encouraged them to call with questions/concerns moving forward.  Harlow Asa, RN, BSN Head & Neck Oncology Nurse [place] [facility] [place] [phone]

## 2019-08-17 ENCOUNTER — Other Ambulatory Visit (HOSPITAL_COMMUNITY)
Admission: RE | Admit: 2019-08-17 | Discharge: 2019-08-17 | Disposition: A | Discharge status: Home / Self Care | Payer: 59 | Source: Ambulatory Visit | Attending: Otolaryngology | Admitting: Otolaryngology

## 2019-08-17 DIAGNOSIS — Z01812 Encounter for preprocedural laboratory examination: Secondary | ICD-10-CM | POA: Diagnosis present

## 2019-08-17 DIAGNOSIS — Z20822 Contact with and (suspected) exposure to covid-19: Secondary | ICD-10-CM | POA: Insufficient documentation

## 2019-08-17 LAB — SARS CORONAVIRUS 2 (TAT 6-24 HRS): SARS Coronavirus 2: NEGATIVE

## 2019-08-17 NOTE — H&P (Signed)
HPI:   Dustin Rios is a 54 y.o. male who presents as a return Patient.   Referring Provider: Kathlene November., D*  Chief complaint: Metastatic cancer.  HPI: History of right cervical lymphadenopathy, biopsy-proven squamous cell carcinoma. Unknown primary at this point. Recent PET scan revealed activity in both tonsils, worse on the left.  PMH/Meds/All/SocHx/FamHx/ROS:   Past Medical History:  Diagnosis Date  . Arthritis  . Gout  . Hearing loss   Past Surgical History:  Procedure Laterality Date  . NO PAST SURGERIES   No family history of bleeding disorders, wound healing problems or difficulty with anesthesia.   Social History   Socioeconomic History  . Marital status: Married  Spouse name: Not on file  . Number of children: Not on file  . Years of education: Not on file  . Highest education level: Not on file  Occupational History  . Not on file  Tobacco Use  . Smoking status: Never Smoker  . Smokeless tobacco: Never Used  Substance and Sexual Activity  . Alcohol use: Never  . Drug use: Never  . Sexual activity: Not on file  Other Topics Concern  . Not on file  Social History Narrative  . Not on file   Social Determinants of Health   Financial Resource Strain:  . Difficulty of Paying Living Expenses:  Food Insecurity:  . Worried About Charity fundraiser in the Last Year:  . Arboriculturist in the Last Year:  Transportation Needs:  . Film/video editor (Medical):  Marland Kitchen Lack of Transportation (Non-Medical):  Physical Activity:  . Days of Exercise per Week:  . Minutes of Exercise per Session:  Stress:  . Feeling of Stress :  Social Connections:  . Frequency of Communication with Friends and Family:  . Frequency of Social Gatherings with Friends and Family:  . Attends Religious Services:  . Active Member of Clubs or Organizations:  . Attends Archivist Meetings:  Marland Kitchen Marital Status:   Current Outpatient Medications:  . ascorbic acid,  vitamin C, (VITAMIN C) 1000 MG tablet, Take 1,000 mg by mouth., Disp: , Rfl:  . colchicine (MITIGARE) 0.6 mg capsule, Take 2 tabs by mouth at onset of gout flare, then 1 tablet 1 hour later., Disp: , Rfl:  . cyanocobalamin (VITAMIN B12) 1000 MCG tablet, Take 1,000 mcg by mouth., Disp: , Rfl:  . docosahexaenoic acid-epa 120-180 mg Cap, Take 1 capsule by mouth., Disp: , Rfl:  . loratadine (CLARITIN) 10 mg tablet, Take 10 mg by mouth., Disp: , Rfl:  . traZODone (DESYREL) 50 MG tablet, Take 50 mg by mouth., Disp: , Rfl:  . zolpidem (AMBIEN) 10 mg tablet, TAKE 1 TABLET BY MOUTH ONCE DAILY FOR 15 DAYS, Disp: , Rfl:   A complete ROS was performed with pertinent positives/negatives noted in the HPI. The remainder of the ROS are negative.   Physical Exam:   BP 145/50  Pulse 79  Temp 97.1 F (36.2 C)  Ht 1.727 m (5\' 8" )  Wt (!) 140.2 kg (309 lb)  BMI 46.98 kg/m   General: Obese gentleman, in no distress, breathing easily. Normal affect. In a pleasant mood. Head: Normocephalic, atraumatic. No masses, or scars. Eyes: Pupils are equal, and reactive to light. Vision is grossly intact. No spontaneous or gaze nystagmus. Hearing: Grossly normal. Nose: Nasal cavities are clear with healthy mucosa, no polyps or exudate. Airways are patent. Face: No masses or scars, facial nerve function is symmetric. Oral Cavity: No  mucosal abnormalities are noted. Tongue with normal mobility. Dentition appears healthy. Oropharynx: Tonsils are moderately sized, slightly fuller on the right. There are no mucosal masses identified. Tongue base appears normal and healthy. Larynx/Hypopharynx: deferred Chest: Deferred Neck: Large nodal metastasis right level 2 area, no thyroid nodules or enlargement. Neuro: Cranial nerves II-XII with normal function. Balance: Normal gate. Other findings: none.  Independent Review of Additional Tests or Records:  none  Procedures:  none  Impression & Plans:  Metastatic squamous  cell carcinoma to the right neck. PET scan revealed activity in the tonsils bilaterally, worse on the contralateral side. Recommend we proceed with direct laryngoscopy, directed biopsies, and bilateral tonsillectomy. We will discuss possible surgical intervention for the cancer following the biopsies once we have identified a primary site or not. He has already visited with medical and radiation oncology and is going to see dental medicine tomorrow. Surgery scheduled for Monday.Faris meets the indications for tonsillectomy. Risks and benefits were discussed in detail. All questions were answered. A handout was provided with additional details. He is on CPAP for sleep apnea.

## 2019-08-17 NOTE — Progress Notes (Signed)
Anesthesia consult per Dr. Doroteo Glassman, will proceed with surgery as scheduled.

## 2019-08-18 ENCOUNTER — Encounter (HOSPITAL_COMMUNITY): Payer: Self-pay | Admitting: Dentistry

## 2019-08-18 ENCOUNTER — Telehealth: Payer: Self-pay | Admitting: Hematology

## 2019-08-18 ENCOUNTER — Other Ambulatory Visit: Payer: Self-pay

## 2019-08-18 ENCOUNTER — Ambulatory Visit (HOSPITAL_COMMUNITY): Payer: Self-pay | Admitting: Dentistry

## 2019-08-18 VITALS — BP 139/82 | HR 82 | Temp 98.0°F

## 2019-08-18 DIAGNOSIS — K036 Deposits [accretions] on teeth: Secondary | ICD-10-CM

## 2019-08-18 DIAGNOSIS — Z01818 Encounter for other preprocedural examination: Secondary | ICD-10-CM

## 2019-08-18 DIAGNOSIS — M264 Malocclusion, unspecified: Secondary | ICD-10-CM | POA: Diagnosis not present

## 2019-08-18 DIAGNOSIS — F40232 Fear of other medical care: Secondary | ICD-10-CM

## 2019-08-18 DIAGNOSIS — C4442 Squamous cell carcinoma of skin of scalp and neck: Secondary | ICD-10-CM

## 2019-08-18 DIAGNOSIS — K0530 Chronic periodontitis, unspecified: Secondary | ICD-10-CM | POA: Diagnosis not present

## 2019-08-18 DIAGNOSIS — R252 Cramp and spasm: Secondary | ICD-10-CM

## 2019-08-18 DIAGNOSIS — M2630 Unspecified anomaly of tooth position of fully erupted tooth or teeth: Secondary | ICD-10-CM

## 2019-08-18 DIAGNOSIS — K06010 Localized gingival recession, unspecified: Secondary | ICD-10-CM

## 2019-08-18 DIAGNOSIS — C760 Malignant neoplasm of head, face and neck: Secondary | ICD-10-CM

## 2019-08-18 DIAGNOSIS — K08409 Partial loss of teeth, unspecified cause, unspecified class: Secondary | ICD-10-CM

## 2019-08-18 MED ORDER — SODIUM FLUORIDE 1.1 % DT CREA: TOPICAL_CREAM | DENTAL | 99 refills | Status: DC

## 2019-08-18 NOTE — Patient Instructions (Signed)

## 2019-08-18 NOTE — Progress Notes (Signed)
DENTAL CONSULTATION  Date of Consultation:  08/18/2019 Patient Name:   Dustin Goley Sr. Date of Birth:   21-Sep-1965 Medical Record Number: [id number]  COVID 19 SCREENING: The patient does not symptoms concerning for COVID-19 infection (Including fever, chills, cough, or new SHORTNESS OF BREATH).    VITALS: BP 139/82 (BP Location: Right Arm)   Pulse 82   Temp 98 F (36.7 C)   CHIEF COMPLAINT: Patient referred by Dr. Isidore Moos for dental consultation.  HPI: Dustin Centanni Sr. is a 54 year old male recently diagnosed with squamous cell carcinoma of the right neck.  Patient currently with an unknown primary tumor.  Patient is scheduled for bilateral tonsillectomies and additional biopsies as needed with Dr. Constance Holster on Monday, 08/21/2019 to help identify the primary tumor .  Patient with anticipated chemoradiation therapy.  Patient is now seen as part of a medically necessary prechemoradiation therapy dental protocol examination.  Patient currently denies acute toothaches, swellings, or abscesses.  The patient was last seen by his dentist for an examination of a sore area in his mouth in February 2021.  Patient was subsequently referred to Dr. Orland Mustard, an endodontist, for evaluation for root canal therapy.  Patient was then referred to an oral surgeon, Dr. Olena Heckle for further evaluation.  Dr. Olena Heckle obtained radiographs and felt that this might be related to a salivary gland infection.  Patient was prescribed antibiotics with no resolution of symptoms.  Patient was then seen on follow-up by Dr. Lewanda Rife in that same oral surgery practice.  Dr. Lewanda Rife then referred the patient to Dr. Erik Obey for further evaluation and treatment.  Dr. Erik Obey obtain CT scans and noted the right neck mass.  A needle biopsy was obtained and squamous of carcinoma of the right neck was diagnosed.  PET scan was obtained with no obvious primary tumor noted.  The patient usually sees Dr. Vincente Poli on an every 10-month basis for  periodontal maintenance procedures.  Patient does have a history of 3 gingival graft procedures approximately 10 years ago with Dr. Seward Grater in [place]..  Patient denies having any partial dentures.  Patient does have some dental phobia by report.  PROBLEM LIST: Patient Active Problem List   Diagnosis Date Noted  . Squamous cell carcinoma of head and neck ([place]) 08/10/2019    Priority: High  . Morbid obesity ([place]) 06/08/2019  . Moderate mixed hyperlipidemia not requiring statin therapy 06/08/2019  . OSA (obstructive sleep apnea) 06/08/2019  . Change in stool 02/22/2017  . Gout 02/22/2017    PMH: Past Medical History:  Diagnosis Date  . Change in stool 02/22/2017  . Gout 02/22/2017  . Hyperlipidemia   . Sleep apnea    Uses CPAP    PSH: Past Surgical History:  Procedure Laterality Date  . CARPAL TUNNEL RELEASE Right 04/10/2019    ALLERGIES: No Known Allergies  MEDICATIONS: Current Outpatient Medications  Medication Sig Dispense Refill  . Ascorbic Acid (VITAMIN C) 1000 MG tablet Take 1,000 mg by mouth daily.    . Colchicine (MITIGARE) 0.6 MG CAPS Take 2 capsules at onset of flare. Then take 1 capsule 1 hour later.  May take 1 capsule up to twice daily thereafter. 60 capsule 2  . EQ LORATADINE PO Take 10 mg by mouth.    . Grape Seed 100 MG CAPS Take 150 mg by mouth.    . Ibuprofen 200 MG CAPS Take by mouth.    . omega-3 fish oil (MAXEPA) 1000 MG CAPS capsule Take 1 capsule by mouth daily.    Marland Kitchen  traZODone (DESYREL) 50 MG tablet Take 1 tablet (50 mg total) by mouth at bedtime as needed for sleep. 30 tablet 3  . vitamin B-12 (CYANOCOBALAMIN) 1000 MCG tablet Take 1,000 mcg by mouth daily.    Marland Kitchen zolpidem (AMBIEN) 10 MG tablet Take 10 mg by mouth daily.     No current facility-administered medications for this visit.    LABS: Lab Results  Component Value Date   WBC 5.0 08/16/2019   HGB 14.4 08/16/2019   HCT 43.7 08/16/2019   MCV 91.8 08/16/2019   PLT 198  08/16/2019      Component Value Date/Time   NA 142 08/16/2019 0744   NA 140 04/03/2019 1138   K 4.3 08/16/2019 0744   CL 105 08/16/2019 0744   CO2 27 08/16/2019 0744   GLUCOSE 112 (H) 08/16/2019 0744   BUN 19 08/16/2019 0744   BUN 15 04/03/2019 1138   CREATININE 1.11 08/16/2019 0744   CALCIUM 8.8 (L) 08/16/2019 0744   GFRNONAA >60 08/16/2019 0744   GFRAA >60 08/16/2019 0744   No results found for: INR, PROTIME No results found for: PTT  SOCIAL HISTORY: Social History   Socioeconomic History  . Marital status: Married    Spouse name: Not on file  . Number of children: Not on file  . Years of education: Not on file  . Highest education level: Not on file  Occupational History  . Not on file  Tobacco Use  . Smoking status: Never Smoker  . Smokeless tobacco: Never Used  Substance and Sexual Activity  . Alcohol use: No  . Drug use: No  . Sexual activity: Not on file  Other Topics Concern  . Not on file  Social History Narrative  . Not on file   Social Determinants of Health   Financial Resource Strain:   . Difficulty of Paying Living Expenses:   Food Insecurity:   . Worried About Charity fundraiser in the Last Year:   . Arboriculturist in the Last Year:   Transportation Needs:   . Film/video editor (Medical):   Marland Kitchen Lack of Transportation (Non-Medical):   Physical Activity:   . Days of Exercise per Week:   . Minutes of Exercise per Session:   Stress:   . Feeling of Stress :   Social Connections:   . Frequency of Communication with Friends and Family:   . Frequency of Social Gatherings with Friends and Family:   . Attends Religious Services:   . Active Member of Clubs or Organizations:   . Attends Archivist Meetings:   Marland Kitchen Marital Status:   Intimate Partner Violence:   . Fear of Current or Ex-Partner:   . Emotionally Abused:   Marland Kitchen Physically Abused:   . Sexually Abused:     FAMILY HISTORY: Family History  Problem Relation Age of Onset  .  COPD Mother   . Uterine cancer Mother   . Lung cancer Brother   . Lung cancer Maternal Grandfather   . Stomach cancer Paternal Grandmother     REVIEW OF SYSTEMS: Reviewed with the patient as per History of present illness. Psych: Patient does have anxiety disorder and some dental phobia.  .  DENTAL HISTORY: CHIEF COMPLAINT: Patient referred by Dr. Isidore Moos for dental consultation.  HPI: Dustin Baumgart Sr. is a 54 year old male recently diagnosed with squamous cell carcinoma of the right neck.  Patient currently with an unknown primary tumor.  Patient is scheduled for bilateral tonsillectomies and  additional biopsies as needed with Dr. Constance Holster on Monday, 08/21/2019 to help identify the primary tumor .  Patient with anticipated chemoradiation therapy.  Patient is now seen as part of a medically necessary prechemoradiation therapy dental protocol examination.  Patient currently denies acute toothaches, swellings, or abscesses.  The patient was last seen by his dentist for an examination of a sore area in his mouth in February 2021.  Patient was subsequently referred to Dr. Orland Mustard, an endodontist, for evaluation for root canal therapy.  Patient was then referred to an oral surgeon, Dr. Olena Heckle for further evaluation.  Dr. Olena Heckle obtained radiographs and felt that this might be related to a salivary gland infection.  Patient was prescribed antibiotics with no resolution of symptoms.  Patient was then seen on follow-up by Dr. Lewanda Rife in that same oral surgery practice.  Dr. Lewanda Rife then referred the patient to Dr. Erik Obey for further evaluation and treatment.  Dr. Erik Obey obtain CT scans and noted the right neck mass.  A needle biopsy was obtained and squamous of carcinoma of the right neck was diagnosed.  PET scan was obtained with no obvious primary tumor noted.  The patient usually sees Dr. Vincente Poli on an every 78-month basis for periodontal maintenance procedures.  Patient does have a history of 3  gingival graft procedures approximately 10 years ago with Dr. Seward Grater in [place]..  Patient denies having any partial dentures.  Patient does have some dental phobia by report.   DENTAL EXAMINATION: GENERAL: The patient is a well-developed, well-nourished male no acute distress. HEAD AND NECK: The patient has right neck lymphadenopathy.  Patient has a maximum interincisal opening of 32 mm and is exhibiting trismus symptoms. INTRAORAL EXAM: Patient has normal saliva.  There is no evidence of oral abscess formation. DENTITION: The patient is missing tooth numbers 1, 16, 70, and 32.  Patient has multiple malpositioned lower anterior teeth. PERIODONTAL: Patient has chronic periodontitis with minimal plaque and calculus accumulations,  gingival recession, and incipient bone loss.  No significant tooth mobility is noted. DENTAL CARIES/SUBOPTIMAL RESTORATIONS: No obvious dental caries are noted. ENDODONTIC: Patient currently denies acute pulpitis symptoms.  Patient does have previous root canal therapy associated with tooth #19.  Patient denies having any persistent problems with that tooth. CROWN AND BRIDGE: Patient has a crown on tooth #19. PROSTHODONTIC: No partial dentures. OCCLUSION: Patient has a poor occlusal scheme secondary to missing teeth and multiple malpositioned teeth.  The occlusion is stable, however.  RADIOGRAPHIC INTERPRETATION: Orthopantogram was taken and supplemented with a full series dental radiographs. There are missing tooth numbers 1, 16, 17, 32.  There is incipient bone loss noted.  There is no evidence of periapical pathology or radiolucency.  Patient has had a previous root canal therapy associated with tooth #19.  Tooth #19 has a crown restoration.   ASSESSMENTS: 1.  Squamous cell carcinoma of the right neck-current unknown primary tumor 2.  Prechemoradiation therapy dental protocol 3.  Chronic periodontitis with bone loss 4.  Gingival recession 5.   Accretions 6.  Missing tooth numbers 1, 16, 17, 32 7.  Multiple malpositioned teeth 8.  Poor occlusal scheme but a stable occlusion 9.  Decreased maximum interincisal opening and trismus symptoms   PLAN/RECOMMENDATIONS: 1. I discussed the risks, benefits, and complications of various treatment options with the patient in relationship to his medical and dental conditions, anticipated chemoradiation therapy, and chemoradiation therapy side effects to include xerostomia, radiation caries, trismus, exercise, taste changes,, changes, and risk for infection  and osteoradionecrosis. We discussed various treatment options to include no treatment, traction the teeth in the primary field radiation therapy, alveoloplasty, pre-prosthetic surgery as indicated, periodontal therapy, dental restorations, root canal therapy, crown and bridge therapy, implant therapy, and replacement of missing teeth as indicated.  We also discussed fabrication of fluoride trays and scatter protection devices.  Currently there are no teeth in the primary field of radiation therapy per anticipated port film drawing from Dr. Isidore Moos.  However, this may change if a primary tumor is identified.  No extractions are scheduled at this time.  The patient currently wishes to proceed with impressions today for the fabrication of fluoride trays and scatter protection devices.  A prescription for Prevident 5000 was sent to his pharmacy with refills for 1 year.  Patient will use of fluoride therapy on his toothbrush or in his fluoride trays as directed.  Patient is to follow-up with Dr. Constance Holster for bilateral tonsillectomies and additional biopsies as needed on Monday.  Dr. Isidore Moos will then tell me if there are any teeth in the primary field of radiation therapy after the results of that procedure are known.  2. Discussion of findings with medical team and coordination of future medical and dental care as needed.  I spent in excess of 120 minutes during  the conduct of this consultation and >50% of this time involved direct face-to-face encounter for counseling and/or coordination of the patient's care.    Lenn Cal, DDS

## 2019-08-18 NOTE — Telephone Encounter (Signed)
No changes made to pt's schedule per 4/7 los.

## 2019-08-21 ENCOUNTER — Other Ambulatory Visit: Payer: Self-pay

## 2019-08-21 ENCOUNTER — Ambulatory Visit (HOSPITAL_BASED_OUTPATIENT_CLINIC_OR_DEPARTMENT_OTHER): Payer: 59 | Admitting: Anesthesiology

## 2019-08-21 ENCOUNTER — Encounter (HOSPITAL_BASED_OUTPATIENT_CLINIC_OR_DEPARTMENT_OTHER): Payer: Self-pay | Admitting: Otolaryngology

## 2019-08-21 ENCOUNTER — Ambulatory Visit (HOSPITAL_BASED_OUTPATIENT_CLINIC_OR_DEPARTMENT_OTHER)
Admission: RE | Admit: 2019-08-21 | Discharge: 2019-08-21 | Disposition: A | Discharge status: Home / Self Care | Payer: 59 | Attending: Otolaryngology | Admitting: Otolaryngology

## 2019-08-21 ENCOUNTER — Encounter (HOSPITAL_BASED_OUTPATIENT_CLINIC_OR_DEPARTMENT_OTHER)
Admission: RE | Disposition: A | Discharge status: Home / Self Care | Payer: Self-pay | Source: Home / Self Care | Attending: Otolaryngology

## 2019-08-21 DIAGNOSIS — Z6841 Body Mass Index (BMI) 40.0 and over, adult: Secondary | ICD-10-CM | POA: Diagnosis not present

## 2019-08-21 DIAGNOSIS — C770 Secondary and unspecified malignant neoplasm of lymph nodes of head, face and neck: Secondary | ICD-10-CM | POA: Insufficient documentation

## 2019-08-21 DIAGNOSIS — M109 Gout, unspecified: Secondary | ICD-10-CM | POA: Insufficient documentation

## 2019-08-21 DIAGNOSIS — Z79899 Other long term (current) drug therapy: Secondary | ICD-10-CM | POA: Insufficient documentation

## 2019-08-21 DIAGNOSIS — Z9089 Acquired absence of other organs: Secondary | ICD-10-CM

## 2019-08-21 DIAGNOSIS — G4730 Sleep apnea, unspecified: Secondary | ICD-10-CM | POA: Insufficient documentation

## 2019-08-21 DIAGNOSIS — C099 Malignant neoplasm of tonsil, unspecified: Secondary | ICD-10-CM | POA: Diagnosis not present

## 2019-08-21 DIAGNOSIS — M1990 Unspecified osteoarthritis, unspecified site: Secondary | ICD-10-CM | POA: Insufficient documentation

## 2019-08-21 HISTORY — PX: DIRECT LARYNGOSCOPY: SHX5326

## 2019-08-21 HISTORY — PX: TONSILLECTOMY: SHX5217

## 2019-08-21 SURGERY — TONSILLECTOMY
Anesthesia: General | Site: Throat

## 2019-08-21 MED ORDER — LABETALOL HCL 5 MG/ML IV SOLN
INTRAVENOUS | Status: AC
  Filled 2019-08-21: qty 4

## 2019-08-21 MED ORDER — LACTATED RINGERS IV SOLN: INTRAVENOUS | Status: DC

## 2019-08-21 MED ORDER — PROMETHAZINE HCL 25 MG RE SUPP: 25.0000 mg | Freq: Four times a day (QID) | RECTAL | 1 refills | Status: DC | PRN

## 2019-08-21 MED ORDER — ACETAMINOPHEN 500 MG PO TABS
ORAL_TABLET | ORAL | Status: AC
  Filled 2019-08-21: qty 1

## 2019-08-21 MED ORDER — HYDROCODONE-ACETAMINOPHEN 7.5-325 MG/15ML PO SOLN: 15.0000 mL | Freq: Four times a day (QID) | ORAL | 0 refills | Status: DC | PRN

## 2019-08-21 MED ORDER — ONDANSETRON HCL 4 MG/2ML IJ SOLN
INTRAMUSCULAR | Status: DC | PRN
  Administered 2019-08-21 (×2): 4 mg via INTRAVENOUS

## 2019-08-21 MED ORDER — PROMETHAZINE HCL 25 MG PO TABS: 25.0000 mg | ORAL_TABLET | Freq: Four times a day (QID) | ORAL | Status: DC | PRN

## 2019-08-21 MED ORDER — HYDROMORPHONE HCL 1 MG/ML IJ SOLN
0.2500 mg | INTRAMUSCULAR | Status: DC | PRN
  Administered 2019-08-21 (×3): 0.5 mg via INTRAVENOUS

## 2019-08-21 MED ORDER — DEXMEDETOMIDINE HCL 200 MCG/2ML IV SOLN
INTRAVENOUS | Status: DC | PRN
  Administered 2019-08-21 (×5): 8 ug via INTRAVENOUS

## 2019-08-21 MED ORDER — EPINEPHRINE PF 1 MG/ML IJ SOLN
INTRAMUSCULAR | Status: DC | PRN
  Administered 2019-08-21: 1 mg

## 2019-08-21 MED ORDER — MEPERIDINE HCL 25 MG/ML IJ SOLN: 6.2500 mg | INTRAMUSCULAR | Status: DC | PRN

## 2019-08-21 MED ORDER — ROCURONIUM BROMIDE 100 MG/10ML IV SOLN
INTRAVENOUS | Status: DC | PRN
  Administered 2019-08-21: 10 mg via INTRAVENOUS
  Administered 2019-08-21: 50 mg via INTRAVENOUS

## 2019-08-21 MED ORDER — SUCCINYLCHOLINE CHLORIDE 200 MG/10ML IV SOSY
PREFILLED_SYRINGE | INTRAVENOUS | Status: DC | PRN
  Administered 2019-08-21: 140 mg via INTRAVENOUS

## 2019-08-21 MED ORDER — PROPOFOL 10 MG/ML IV BOLUS
INTRAVENOUS | Status: DC | PRN
  Administered 2019-08-21: 200 mg via INTRAVENOUS

## 2019-08-21 MED ORDER — DEXTROSE-NACL 5-0.9 % IV SOLN: INTRAVENOUS | Status: DC

## 2019-08-21 MED ORDER — SUCCINYLCHOLINE CHLORIDE 200 MG/10ML IV SOSY
PREFILLED_SYRINGE | INTRAVENOUS | Status: AC
  Filled 2019-08-21: qty 10

## 2019-08-21 MED ORDER — PROMETHAZINE HCL 25 MG RE SUPP: 25.0000 mg | Freq: Four times a day (QID) | RECTAL | Status: DC | PRN

## 2019-08-21 MED ORDER — HYDROMORPHONE HCL 1 MG/ML IJ SOLN
INTRAMUSCULAR | Status: AC
  Filled 2019-08-21: qty 0.5

## 2019-08-21 MED ORDER — SUGAMMADEX SODIUM 200 MG/2ML IV SOLN
INTRAVENOUS | Status: DC | PRN
  Administered 2019-08-21: 500 mg via INTRAVENOUS

## 2019-08-21 MED ORDER — FENTANYL CITRATE (PF) 100 MCG/2ML IJ SOLN
INTRAMUSCULAR | Status: AC
  Filled 2019-08-21: qty 2

## 2019-08-21 MED ORDER — OXYCODONE HCL 5 MG/5ML PO SOLN
5.0000 mg | Freq: Once | ORAL | Status: AC | PRN
  Administered 2019-08-21: 16:00:00 5 mg via ORAL

## 2019-08-21 MED ORDER — OXYMETAZOLINE HCL 0.05 % NA SOLN
NASAL | Status: DC | PRN
  Administered 2019-08-21: 1 via NASAL

## 2019-08-21 MED ORDER — LIDOCAINE 2% (20 MG/ML) 5 ML SYRINGE
INTRAMUSCULAR | Status: DC | PRN
  Administered 2019-08-21: 60 mg via INTRAVENOUS

## 2019-08-21 MED ORDER — PROPOFOL 10 MG/ML IV BOLUS
INTRAVENOUS | Status: AC
  Filled 2019-08-21: qty 20

## 2019-08-21 MED ORDER — IBUPROFEN 100 MG/5ML PO SUSP: 400.0000 mg | Freq: Four times a day (QID) | ORAL | Status: DC | PRN

## 2019-08-21 MED ORDER — OXYMETAZOLINE HCL 0.05 % NA SOLN
NASAL | Status: AC
  Filled 2019-08-21: qty 30

## 2019-08-21 MED ORDER — KETOROLAC TROMETHAMINE 30 MG/ML IJ SOLN: 30.0000 mg | Freq: Once | INTRAMUSCULAR | Status: DC | PRN

## 2019-08-21 MED ORDER — OXYCODONE HCL 5 MG PO TABS: 5.0000 mg | ORAL_TABLET | Freq: Once | ORAL | Status: AC | PRN

## 2019-08-21 MED ORDER — PHENOL 1.4 % MT LIQD: 1.0000 | OROMUCOSAL | Status: DC | PRN

## 2019-08-21 MED ORDER — FENTANYL CITRATE (PF) 100 MCG/2ML IJ SOLN
INTRAMUSCULAR | Status: DC | PRN
  Administered 2019-08-21: 50 ug via INTRAVENOUS
  Administered 2019-08-21: 100 ug via INTRAVENOUS
  Administered 2019-08-21: 50 ug via INTRAVENOUS

## 2019-08-21 MED ORDER — DEXAMETHASONE SODIUM PHOSPHATE 10 MG/ML IJ SOLN
INTRAMUSCULAR | Status: DC | PRN
  Administered 2019-08-21: 10 mg via INTRAVENOUS

## 2019-08-21 MED ORDER — ACETAMINOPHEN 500 MG PO TABS
1000.0000 mg | ORAL_TABLET | Freq: Once | ORAL | Status: AC
  Administered 2019-08-21: 1000 mg via ORAL

## 2019-08-21 MED ORDER — PROMETHAZINE HCL 25 MG/ML IJ SOLN: 6.2500 mg | INTRAMUSCULAR | Status: DC | PRN

## 2019-08-21 MED ORDER — HYDROCODONE-ACETAMINOPHEN 7.5-325 MG/15ML PO SOLN: 10.0000 mL | ORAL | Status: DC | PRN

## 2019-08-21 MED ORDER — OXYCODONE HCL 5 MG/5ML PO SOLN
ORAL | Status: AC
  Filled 2019-08-21: qty 5

## 2019-08-21 MED ORDER — SUGAMMADEX SODIUM 500 MG/5ML IV SOLN
INTRAVENOUS | Status: AC
  Filled 2019-08-21: qty 5

## 2019-08-21 MED ORDER — LABETALOL HCL 5 MG/ML IV SOLN
INTRAVENOUS | Status: DC | PRN
  Administered 2019-08-21 (×2): 5 mg via INTRAVENOUS

## 2019-08-21 MED ORDER — MIDAZOLAM HCL 2 MG/2ML IJ SOLN
INTRAMUSCULAR | Status: AC
  Filled 2019-08-21: qty 2

## 2019-08-21 MED ORDER — MIDAZOLAM HCL 5 MG/5ML IJ SOLN
INTRAMUSCULAR | Status: DC | PRN
  Administered 2019-08-21: 2 mg via INTRAVENOUS

## 2019-08-21 SURGICAL SUPPLY — 47 items
BNDG EYE OVAL (GAUZE/BANDAGES/DRESSINGS) IMPLANT
CANISTER SUCT 1200ML W/VALVE (MISCELLANEOUS) ×4 IMPLANT
CATH ROBINSON RED A/P 12FR (CATHETERS) ×4 IMPLANT
CNTNR URN SCR LID CUP LEK RST (MISCELLANEOUS) IMPLANT
COAGULATOR SUCT 6 FR SWTCH (ELECTROSURGICAL) ×1
COAGULATOR SUCT SWTCH 10FR 6 (ELECTROSURGICAL) ×3 IMPLANT
CONT SPEC 4OZ STRL OR WHT (MISCELLANEOUS)
COVER BACK TABLE 60X90IN (DRAPES) ×4 IMPLANT
COVER MAYO STAND STRL (DRAPES) ×4 IMPLANT
COVER WAND RF STERILE (DRAPES) IMPLANT
ELECT COATED BLADE 2.86 ST (ELECTRODE) ×4 IMPLANT
ELECT REM PT RETURN 9FT ADLT (ELECTROSURGICAL)
ELECT REM PT RETURN 9FT PED (ELECTROSURGICAL)
ELECTRODE REM PT RETRN 9FT PED (ELECTROSURGICAL) IMPLANT
ELECTRODE REM PT RTRN 9FT ADLT (ELECTROSURGICAL) IMPLANT
GAUZE SPONGE 4X4 12PLY STRL LF (GAUZE/BANDAGES/DRESSINGS) ×8 IMPLANT
GLOVE ECLIPSE 7.5 STRL STRAW (GLOVE) ×4 IMPLANT
GOWN STRL REUS W/ TWL LRG LVL3 (GOWN DISPOSABLE) ×4 IMPLANT
GOWN STRL REUS W/ TWL XL LVL3 (GOWN DISPOSABLE) IMPLANT
GOWN STRL REUS W/TWL LRG LVL3 (GOWN DISPOSABLE) ×8
GOWN STRL REUS W/TWL XL LVL3 (GOWN DISPOSABLE)
GUARD TEETH (MISCELLANEOUS) IMPLANT
MARKER SKIN DUAL TIP RULER LAB (MISCELLANEOUS) IMPLANT
NDL HYPO 18GX1.5 BLUNT FILL (NEEDLE) ×2 IMPLANT
NDL SPNL 22GX7 QUINCKE BK (NEEDLE) IMPLANT
NDL SPNL 25GX3.5 QUINCKE BL (NEEDLE) ×2 IMPLANT
NEEDLE HYPO 18GX1.5 BLUNT FILL (NEEDLE) ×4 IMPLANT
NEEDLE SPNL 22GX7 QUINCKE BK (NEEDLE) IMPLANT
NEEDLE SPNL 25GX3.5 QUINCKE BL (NEEDLE) ×4 IMPLANT
NS IRRIG 1000ML POUR BTL (IV SOLUTION) ×4 IMPLANT
PACK BASIN DAY SURGERY FS (CUSTOM PROCEDURE TRAY) ×4 IMPLANT
PATTIES SURGICAL .5 X3 (DISPOSABLE) ×4 IMPLANT
PENCIL FOOT CONTROL (ELECTRODE) ×4 IMPLANT
SHEET MEDIUM DRAPE 40X70 STRL (DRAPES) ×4 IMPLANT
SOLUTION BUTLER CLEAR DIP (MISCELLANEOUS) ×4 IMPLANT
SPONGE TONSIL TAPE 1 RFD (DISPOSABLE) IMPLANT
SPONGE TONSIL TAPE 1.25 RFD (DISPOSABLE) IMPLANT
SURGILUBE 2OZ TUBE FLIPTOP (MISCELLANEOUS) IMPLANT
SYR 5ML LL (SYRINGE) ×4 IMPLANT
SYR BULB 3OZ (MISCELLANEOUS) ×4 IMPLANT
SYR CONTROL 10ML LL (SYRINGE) IMPLANT
SYR TB 1ML LL NO SAFETY (SYRINGE) ×4 IMPLANT
TOWEL GREEN STERILE FF (TOWEL DISPOSABLE) ×4 IMPLANT
TUBE CONNECTING 20'X1/4 (TUBING) ×1
TUBE CONNECTING 20X1/4 (TUBING) ×3 IMPLANT
TUBE SALEM SUMP 12R W/ARV (TUBING) IMPLANT
TUBE SALEM SUMP 16 FR W/ARV (TUBING) IMPLANT

## 2019-08-21 NOTE — Anesthesia Procedure Notes (Signed)
Procedure Name: Intubation Date/Time: 08/21/2019 11:18 AM Performed by: Gwyndolyn Saxon, CRNA Pre-anesthesia Checklist: Patient identified, Emergency Drugs available, Suction available and Patient being monitored Patient Re-evaluated:Patient Re-evaluated prior to induction Oxygen Delivery Method: Circle system utilized Preoxygenation: Pre-oxygenation with 100% oxygen Induction Type: IV induction Ventilation: Oral airway inserted - appropriate to patient size and Mask ventilation without difficulty Laryngoscope Size: Glidescope and 4 Grade View: Grade I Tube type: Oral Tube size: 7.5 mm Number of attempts: 1 Airway Equipment and Method: Rigid stylet Placement Confirmation: ETT inserted through vocal cords under direct vision,  positive ETCO2 and breath sounds checked- equal and bilateral Secured at: 21 cm Tube secured with: Tape Dental Injury: Teeth and Oropharynx as per pre-operative assessment  Difficulty Due To: Difficulty was anticipated, Difficult Airway- due to reduced neck mobility, Difficult Airway- due to large tongue and Difficult Airway- due to limited oral opening

## 2019-08-21 NOTE — Anesthesia Postprocedure Evaluation (Signed)
Anesthesia Post Note  Patient: Dustin Tunnicliff Sr.  Procedure(s) Performed: TONSILLECTOMY (Bilateral Throat) DIRECT LARYNGOSCOPY - WITH BX (N/A Throat)     Patient location during evaluation: PACU Anesthesia Type: General Level of consciousness: awake and alert, oriented and patient cooperative Pain management: pain level controlled Vital Signs Assessment: post-procedure vital signs reviewed and stable Respiratory status: spontaneous breathing, nonlabored ventilation and respiratory function stable Cardiovascular status: blood pressure returned to baseline and stable Postop Assessment: no apparent nausea or vomiting Anesthetic complications: no    Last Vitals:  Vitals:   08/21/19 1238 08/21/19 1245  BP:  119/68  Pulse: 67 68  Resp: 12 12  Temp:    SpO2: 95% 95%    Last Pain:  Vitals:   08/21/19 1245  TempSrc:   PainSc: [place]

## 2019-08-21 NOTE — Discharge Instructions (Signed)
Next dose of Tylenol at 3:45 PM   Tonsillectomy, Adult, Care After This sheet gives you information about how to care for yourself after your procedure. Your doctor may also give you more specific instructions. If you have problems or questions, contact your doctor. Follow these instructions at home: Eating and drinking   Follow instructions from your doctor about eating and drinking.  For many days after surgery, choose foods that are soft and cold. Examples are: ? Gelatin. ? Sherbet. ? Ice cream. ? Frozen ice pops.  If you have an upset stomach (are nauseous), choose liquids that are cold and that you can see through (clear liquids). Examples are water and apple juice without pulp. You can try thick liquids and soft foods when you can eat without throwing up (vomiting) and without too much pain. Examples are: ? Creamed soups. ? Soft, warm cereals, such as oatmeal or hot wheat cereal. ? Milk. ? Mashed potatoes. ? Applesauce.  Drink enough fluid to keep your pee (urine) clear or pale yellow. Driving  Do not drive for 24 hours if you were given a medicine to help you relax (sedative).  Do not drive or use heavy machinery while taking prescription pain medicine or until your health care provider approves. General instructions  Rest.  Keep your head raised (elevated) when lying down.  Take medicines only as told by your doctor. These include over-the-counter medicines and prescription medicines.  Do not use mouthwashes until your doctor says it is okay.  Gargle only as told by your doctor.  Stay away from people who are sick. Contact a doctor if:  Your pain gets worse or medicines do not help.  You have a fever.  You have a rash.  You feel light-headed or you pass out (faint).  You cannot swallow even a little liquid or spit (saliva).  Your pee is very dark. Get help right away if:  You have trouble breathing.  You bleed bright red blood from your  throat.  You throw up bright red blood. Summary  Follow instructions from your doctor about what you cannot eat or drink. For many days after surgery, choose foods that are soft and cold.  Talk with your doctor about how to keep your pain under control. This can help you rest and swallow better.  Get help right away if you bleed bright red blood from your throat or you throw up bright red blood. This information is not intended to replace advice given to you by your health care provider. Make sure you discuss any questions you have with your health care provider. Document Revised: 04/09/2017 Document Reviewed: 03/20/2016 Elsevier Patient Education  [place] Instructions  Activity: Get plenty of rest for the remainder of the day. A responsible individual must stay with you for 24 hours following the procedure.  For the next 24 hours, DO NOT: -Drive a car -Paediatric nurse -Drink alcoholic beverages -Take any medication unless instructed by your physician -Make any legal decisions or sign important papers.  Meals: Start with liquid foods such as gelatin or soup. Progress to regular foods as tolerated. Avoid greasy, spicy, heavy foods. If nausea and/or vomiting occur, drink only clear liquids until the nausea and/or vomiting subsides. Call your physician if vomiting continues.  Special Instructions/Symptoms: Your throat may feel dry or sore from the anesthesia or the breathing tube placed in your throat during surgery. If this causes discomfort, gargle with warm salt water.  The discomfort should disappear within 24 hours.  If you had a scopolamine patch placed behind your ear for the management of post- operative nausea and/or vomiting:  1. The medication in the patch is effective for 72 hours, after which it should be removed.  Wrap patch in a tissue and discard in the trash. Wash hands thoroughly with soap and water. 2. You may remove the  patch earlier than 72 hours if you experience unpleasant side effects which may include dry mouth, dizziness or visual disturbances. 3. Avoid touching the patch. Wash your hands with soap and water after contact with the patch.

## 2019-08-21 NOTE — Transfer of Care (Signed)
Immediate Anesthesia Transfer of Care Note  Patient: Dustin Leiter Sr.  Procedure(s) Performed: TONSILLECTOMY (Bilateral Throat) DIRECT LARYNGOSCOPY - WITH BX (N/A Throat)  Patient Location: PACU  Anesthesia Type:General  Level of Consciousness: awake and patient cooperative  Airway & Oxygen Therapy: Patient Spontanous Breathing and Patient connected to face mask oxygen  Post-op Assessment: Report given to RN and Post -op Vital signs reviewed and stable  Post vital signs: Reviewed and stable  Last Vitals:  Vitals Value Taken Time  BP 136/84 08/21/19 1212  Temp    Pulse 73 08/21/19 1215  Resp 14 08/21/19 1215  SpO2 98 % 08/21/19 1215  Vitals shown include unvalidated device data.  Last Pain:  Vitals:   08/21/19 0935  TempSrc: Tympanic  PainSc: 5          Complications: No apparent anesthesia complications

## 2019-08-21 NOTE — Anesthesia Preprocedure Evaluation (Addendum)
Anesthesia Evaluation  Patient identified by MRN, date of birth, ID band Patient awake    Reviewed: Allergy & Precautions, NPO status , Patient's Chart, lab work & pertinent test results  Airway Mallampati: III  TM Distance: >3 FB Neck ROM: Full    Dental no notable dental hx. (+) Teeth Intact, Dental Advisory Given   Pulmonary sleep apnea and Continuous Positive Airway Pressure Ventilation ,    Pulmonary exam normal breath sounds clear to auscultation       Cardiovascular Normal cardiovascular exam Rhythm:Regular Rate:Normal  HLD   Neuro/Psych negative neurological ROS  negative psych ROS   GI/Hepatic negative GI ROS, Neg liver ROS,   Endo/Other  Morbid obesityBMI 47  Renal/GU negative Renal ROS  negative genitourinary   Musculoskeletal gout   Abdominal (+) + obese,   Peds negative pediatric ROS (+)  Hematology negative hematology ROS (+)   Anesthesia Other Findings Squamous cell of head and neck  Reproductive/Obstetrics negative OB ROS                            Anesthesia Physical Anesthesia Plan  ASA: III  Anesthesia Plan: General   Post-op Pain Management:    Induction: Intravenous  PONV Risk Score and Plan: 3 and Ondansetron, Dexamethasone, Midazolam and Treatment may vary due to age or medical condition  Airway Management Planned: Oral ETT  Additional Equipment: None  Intra-op Plan:   Post-operative Plan: Extubation in OR  Informed Consent: I have reviewed the patients History and Physical, chart, labs and discussed the procedure including the risks, benefits and alternatives for the proposed anesthesia with the patient or authorized representative who has indicated his/her understanding and acceptance.     Dental advisory given  Plan Discussed with: CRNA  Anesthesia Plan Comments:         Anesthesia Quick Evaluation

## 2019-08-21 NOTE — Interval H&P Note (Signed)
History and Physical Interval Note:  08/21/2019 10:47 AM  Dustin Rios Sr.  has presented today for surgery, with the diagnosis of squamous cell of neck head and neck.  The various methods of treatment have been discussed with the patient and family. After consideration of risks, benefits and other options for treatment, the patient has consented to  Procedure(s): TONSILLECTOMY (Bilateral) DIRECT LARYNGOSCOPY - WITH BX (N/A) as a surgical intervention.  The patient's history has been reviewed, patient examined, no change in status, stable for surgery.  I have reviewed the patient's chart and labs.  Questions were answered to the patient's satisfaction.     [name]

## 2019-08-21 NOTE — Op Note (Signed)
08/21/2019  11:58 AM  PATIENT:  Dustin Leiter Sr.  54 y.o. male  PRE-OPERATIVE DIAGNOSIS:  squamous cell of neck head and neck  POST-OPERATIVE DIAGNOSIS:  squamous cell of neck head and neck  PROCEDURE:  Procedure(s): TONSILLECTOMY DIRECT LARYNGOSCOPY - WITH BX  SURGEON:  Surgeon(s): [name], MD  ANESTHESIA:   General  COUNTS: Correct   DICTATION: The patient was taken to the operating room and placed on the operating table in the supine position. Following induction of general endotracheal anesthesia, the table was turned and the patient was draped in a standard fashion. A Crowe-Davis mouthgag was inserted into the oral cavity and used to retract the tongue and mandible, then attached to the Mayo stand.  The tonsillectomy was then performed using electrocautery dissection, carefully dissecting the avascular plane between the capsule and constrictor muscles. Cautery was used for completion of hemostasis. The tonsils were relatively small and there were no obvious masses palpable or ulceration seen on the surface.  They were sent separately identified as left and right tonsil and sent for pathologic evaluation.  A maxillary tooth protector was used and a Jako laryngoscope was used to evaluate the larynx and oropharynx and hypopharynx.  Due to his anatomy is very difficult to examine his larynx and his piriform sinuses.  I was able to get a good look in the oropharynx and hypopharynx.  There were no obvious lesions visible or palpable in the base of tongue vallecula or nasopharynx.  Biopsies were taken from the right and left base of tongue separately labeled and sent for pathologic valuation.  Suction cautery used for hemostasis.  The pharynx was irrigated with saline and suctioned. An oral gastric tube was used to aspirate the contents of the stomach. The patient was then awakened from anesthesia and transferred to PACU in stable condition.   PATIENT DISPOSITION:  To PACA,  stable

## 2019-08-22 ENCOUNTER — Telehealth (HOSPITAL_COMMUNITY): Payer: Self-pay

## 2019-08-22 ENCOUNTER — Encounter: Payer: Self-pay | Admitting: *Deleted

## 2019-08-22 NOTE — Telephone Encounter (Signed)
I called patient to schedule appointment for insertion of Fluoride Trays and Scatter Protection Devices. Patient refused to schedule appointment at this time and will call back to schedule appointment.

## 2019-08-22 NOTE — Progress Notes (Signed)
Oncology Nurse Navigator Documentation  Mrs. Endsley called me this am to ask to have his PT, Nutrition, and Social Work consult cancelled tomorrow. He had surgery yesterday and is not feeling well enough to attend those appointments. I have arranged for his PT appointment to rescheduled for 08/30/19, Nutrition will talk to him on the phone tomorrow, and SW will see him the next time he is here at the [facility]. I have called Mrs. Beckmann and informed her of the above. She was very appreciative of everybody willingness to be flexible. She knows to call me if she has any further needs/questions.   Harlow Asa RN, BSN, OCN Head & Neck Oncology Nurse [place] at [facility] Phone # [phone]  Fax # [phone]

## 2019-08-23 ENCOUNTER — Inpatient Hospital Stay: Payer: 59 | Admitting: Nutrition

## 2019-08-23 ENCOUNTER — Inpatient Hospital Stay: Payer: 59 | Admitting: *Deleted

## 2019-08-23 ENCOUNTER — Ambulatory Visit: Payer: 59 | Admitting: Rehabilitation

## 2019-08-23 NOTE — Progress Notes (Signed)
54 year old male diagnosed with squamous cell cancer of the head and neck.  He is status post tonsillectomy and direct laryngoscopy for biopsy.  He is a patient of Dr. Isidore Moos and Dr. Maylon Peppers.  Past medical history includes gout and sleep apnea.  Medications include vitamin C, grapeseed, omega-3 fatty acid, vitamin B12.  Labs include glucose 112 on April 7.  Height: 5 feet 8 inches. Weight: 304.46 pounds on April 12. BMI: 46.29.  Patient was currently resting however I spoke with patient's wife. Patient has had worsening pain today.  He is still on clear liquid diet.  She has been giving him electrolyte drinks, Jell-O, and broth. Patient does not care for milk. Patient is generally a good eater per wife.  Nutrition diagnosis:  Food and nutrition related knowledge deficit related to new diagnosis of cancer and associated treatments as evidenced by no prior need for nutrition related information.  Intervention: Education was provided on strategies for increasing calories and protein in small frequent meals and snacks. Reviewed soft high-protein foods. Recommended patient begin oral nutrition supplements when tolerated. Discouraged loss of lean body mass during treatment. Email fact sheets.  Monitoring, evaluation, goals: Patient will tolerate adequate calories and protein to minimize loss of lean body mass.  Next visit: To be scheduled with treatments as needed.  **Disclaimer: This note was dictated with voice recognition software. Similar sounding words can inadvertently be transcribed and this note may contain transcription errors which may not have been corrected upon publication of note.**

## 2019-08-30 ENCOUNTER — Ambulatory Visit: Payer: 59 | Admitting: Rehabilitation

## 2019-09-04 ENCOUNTER — Encounter (HOSPITAL_COMMUNITY): Payer: Self-pay | Admitting: Dentistry

## 2019-09-04 ENCOUNTER — Other Ambulatory Visit: Payer: Self-pay

## 2019-09-04 ENCOUNTER — Ambulatory Visit (HOSPITAL_COMMUNITY): Payer: Dental | Admitting: Dentistry

## 2019-09-04 VITALS — BP 142/78 | HR 73 | Temp 98.3°F

## 2019-09-04 DIAGNOSIS — Z463 Encounter for fitting and adjustment of dental prosthetic device: Secondary | ICD-10-CM

## 2019-09-04 DIAGNOSIS — C099 Malignant neoplasm of tonsil, unspecified: Secondary | ICD-10-CM | POA: Insufficient documentation

## 2019-09-04 DIAGNOSIS — Z01818 Encounter for other preprocedural examination: Secondary | ICD-10-CM

## 2019-09-04 NOTE — Progress Notes (Signed)
09/04/2019  Patient Name:   Dustin Skiles Sr. Date of Birth:   03-Jul-1965 Medical Record Number: [id number]   COVID 19 SCREENING: The patient does not symptoms concerning for COVID-19 infection (Including fever, chills, cough, or new SHORTNESS OF BREATH).   BP (!) 142/78 (BP Location: Left Arm)   Pulse 73   Temp 98.3 F (36.8 C)   Dustin Barrero Sr. now presents for insertion of upper and lower fluoride trays and scatter protection devices.  Patient with anticipated neck dissection with Dr. Constance Holster in the near future.  This to be followed by postoperative chemoradiation therapy as indicated based on pathology findings.  PROCEDURE: Appliances were tried in and adjusted as needed. [place] Trismus device was previously  fabricated at 32 mm using 18 sticks. Postop instructions were provided and a written and verbal format concerning the use and care of appliances. All questions were answered. Patient is scheduled for neck dissection with Dr. Constance Holster in approximately 1 week. Patient to return to Dental medicine clinic for periodic oral exam during the second week of radiation therapy if he so desires.  Patient to call dental medicine to arrange for that appointment as indicated.  Patient will also need to be seen 1 month after the radiation therapy has been completed.  Patient to call if questions or problems arise before then.   Lenn Cal, DDS

## 2019-09-04 NOTE — Patient Instructions (Addendum)
Patient to return to Dental medicine clinic for periodic oral exam during the second week of radiation therapy if he so desires.  Patient to call dental medicine to arrange for that appointment as indicated.  Patient will also need to be seen 1 month after the radiation therapy has been completed.  Patient to call if questions or problems arise before then.   Lenn Cal, DDS    FLUORIDE TRAYS PATIENT INSTRUCTIONS    Obtain Prevident 5000 prescription from the pharmacy.  Don't be surprised if it needs to be ordered.  Be sure to let the pharmacy know when you are close to needing a new refill for them to have it ready for you without interruption of Fluoride use.  The best time to use your Fluoride is before bedtime.  You must brush your teeth very well and floss before using the Fluoride in order to get the best use out of the Fluoride treatments.  Place Fluoride gel in the tray and spread gel around in the tray with your finger or cotton tip applicator.  Place the tray on your lower teeth and your upper teeth.  Make sure the trays are seated all the way.  Remember, they only fit one way on your teeth.  Insert for 5 full minutes.  At the end of the 5 minutes, take the trays out.  SPIT OUT excess.   Do NOT rinse your mouth!  Do NOT eat or drink after treatments for at least 30 minutes.  This is why the best time for your treatments is before bedtime.  Clean the inside of your Fluoride trays using COLD WATER and a toothbrush.  In order to keep your Trays from discoloring and free from odors, soak them overnight in denture cleaners such as Efferdent.  Do not use bleach or non denture products.  Store the trays in a safe dry place AWAY from any heat until your next treatment.  If anything happens to your Fluoride trays, or they don't fit as well after any dental work, please let us know as soon as possible.

## 2019-09-05 ENCOUNTER — Ambulatory Visit: Payer: 59 | Attending: Internal Medicine

## 2019-09-05 DIAGNOSIS — Z23 Encounter for immunization: Secondary | ICD-10-CM

## 2019-09-05 NOTE — Progress Notes (Signed)
   [id number]  Name:  Dustin Reddish Sr.    MRN: [id number] DOB: 1965-06-11  09/05/2019  Dustin Rios was observed post Covid-19 immunization for 15 minutes without incident. He was provided with Vaccine Information Sheet and instruction to access the V-Safe system.   Dustin Rios was instructed to call 911 with any severe reactions post vaccine: Marland Kitchen Difficulty breathing  . Swelling of face and throat  . A fast heartbeat  . A bad rash all over body  . Dizziness and weakness   Immunizations Administered    Name Date Dose VIS Date Route   Moderna COVID-19 Vaccine 09/05/2019  8:35 AM 0.5 mL 04/2019 Intramuscular   Manufacturer: Moderna   Lot: [id number]   [id number]

## 2019-09-05 NOTE — H&P (Signed)
HPI:   Dustin Rios is a 54 y.o. male who presents as a return Patient.   Referring Provider: Kathlene November., D*  Chief complaint: Metastatic cancer.  HPI: History of right cervical lymphadenopathy, biopsy-proven squamous cell carcinoma. Unknown primary at this point. Recent PET scan revealed activity in both tonsils, worse on the left.  PMH/Meds/All/SocHx/FamHx/ROS:   Past Medical History:  Diagnosis Date  . Arthritis  . Gout  . Hearing loss   Past Surgical History:  Procedure Laterality Date  . NO PAST SURGERIES   No family history of bleeding disorders, wound healing problems or difficulty with anesthesia.   Social History   Socioeconomic History  . Marital status: Married  Spouse name: Not on file  . Number of children: Not on file  . Years of education: Not on file  . Highest education level: Not on file  Occupational History  . Not on file  Tobacco Use  . Smoking status: Never Smoker  . Smokeless tobacco: Never Used  Substance and Sexual Activity  . Alcohol use: Never  . Drug use: Never  . Sexual activity: Not on file  Other Topics Concern  . Not on file  Social History Narrative  . Not on file   Social Determinants of Health   Financial Resource Strain:  . Difficulty of Paying Living Expenses:  Food Insecurity:  . Worried About Charity fundraiser in the Last Year:  . Arboriculturist in the Last Year:  Transportation Needs:  . Film/video editor (Medical):  Marland Kitchen Lack of Transportation (Non-Medical):  Physical Activity:  . Days of Exercise per Week:  . Minutes of Exercise per Session:  Stress:  . Feeling of Stress :  Social Connections:  . Frequency of Communication with Friends and Family:  . Frequency of Social Gatherings with Friends and Family:  . Attends Religious Services:  . Active Member of Clubs or Organizations:  . Attends Archivist Meetings:  Marland Kitchen Marital Status:   Current Outpatient Medications:  . ascorbic acid,  vitamin C, (VITAMIN C) 1000 MG tablet, Take 1,000 mg by mouth., Disp: , Rfl:  . colchicine (MITIGARE) 0.6 mg capsule, Take 2 tabs by mouth at onset of gout flare, then 1 tablet 1 hour later., Disp: , Rfl:  . cyanocobalamin (VITAMIN B12) 1000 MCG tablet, Take 1,000 mcg by mouth., Disp: , Rfl:  . docosahexaenoic acid-epa 120-180 mg Cap, Take 1 capsule by mouth., Disp: , Rfl:  . loratadine (CLARITIN) 10 mg tablet, Take 10 mg by mouth., Disp: , Rfl:  . traZODone (DESYREL) 50 MG tablet, Take 50 mg by mouth., Disp: , Rfl:  . zolpidem (AMBIEN) 10 mg tablet, TAKE 1 TABLET BY MOUTH ONCE DAILY FOR 15 DAYS, Disp: , Rfl:   A complete ROS was performed with pertinent positives/negatives noted in the HPI. The remainder of the ROS are negative.   Physical Exam:   BP 145/50  Pulse 79  Temp 97.1 F (36.2 C)  Ht 1.727 m (5\' 8" )  Wt (!) 140.2 kg (309 lb)  BMI 46.98 kg/m   General: Obese gentleman, in no distress, breathing easily. Normal affect. In a pleasant mood. Head: Normocephalic, atraumatic. No masses, or scars. Eyes: Pupils are equal, and reactive to light. Vision is grossly intact. No spontaneous or gaze nystagmus. Hearing: Grossly normal. Nose: Nasal cavities are clear with healthy mucosa, no polyps or exudate. Airways are patent. Face: No masses or scars, facial nerve function is symmetric. Oral Cavity: No  mucosal abnormalities are noted. Tongue with normal mobility. Dentition appears healthy. Oropharynx: Tonsils are moderately sized, slightly fuller on the right. There are no mucosal masses identified. Tongue base appears normal and healthy. Larynx/Hypopharynx: deferred Chest: Deferred Neck: Large nodal metastasis right level 2 area, no thyroid nodules or enlargement. Neuro: Cranial nerves II-XII with normal function. Balance: Normal gate. Other findings: none.  Independent Review of Additional Tests or Records:  none  Procedures:  none  Impression & Plans:  Metastatic squamous  cell carcinoma to the right neck. PET scan revealed activity in the tonsils bilaterally, worse on the contralateral side. Recommend we proceed with direct laryngoscopy, directed biopsies, and bilateral tonsillectomy. We will discuss possible surgical intervention for the cancer following the biopsies once we have identified a primary site or not. He has already visited with medical and radiation oncology and is going to see dental medicine tomorrow. Surgery scheduled for Monday.[name] meets the indications for tonsillectomy. Risks and benefits were discussed in detail. All questions were answered. A handout was provided with additional details. He is on CPAP for sleep apnea.

## 2019-09-06 ENCOUNTER — Encounter: Payer: Self-pay | Admitting: Rehabilitation

## 2019-09-06 ENCOUNTER — Other Ambulatory Visit: Payer: Self-pay

## 2019-09-06 ENCOUNTER — Telehealth: Payer: Self-pay | Admitting: Family Medicine

## 2019-09-06 ENCOUNTER — Ambulatory Visit: Payer: 59 | Attending: Radiation Oncology | Admitting: Rehabilitation

## 2019-09-06 DIAGNOSIS — M436 Torticollis: Secondary | ICD-10-CM | POA: Diagnosis present

## 2019-09-06 DIAGNOSIS — R293 Abnormal posture: Secondary | ICD-10-CM | POA: Diagnosis present

## 2019-09-06 DIAGNOSIS — G4733 Obstructive sleep apnea (adult) (pediatric): Secondary | ICD-10-CM

## 2019-09-06 NOTE — Telephone Encounter (Signed)
Patient just needing supplies RX pended for you to sign I will fax to [place] apoth send back to [facility].

## 2019-09-06 NOTE — Therapy (Deleted)
[place], [place], [address] Phone: [phone]   Fax:  [phone]  Physical Therapy Treatment  Patient Details  Name: Dustin Laos Sr. MRN: [id number] Date of Birth: 10-11-1965 Referring Provider (PT): Dr. Isidore Moos   Encounter Date: 09/06/2019  PT End of Session - 09/06/19 0849    Visit Number  1    Number of Visits  1    PT Start Time  0806    PT Stop Time  0841    PT Time Calculation (min)  35 min    Activity Tolerance  Patient tolerated treatment well    Behavior During Therapy  [facility] for tasks assessed/performed       Past Medical History:  Diagnosis Date  . Change in stool 02/22/2017  . Gout 02/22/2017  . Hyperlipidemia   . Sleep apnea    Uses CPAP    Past Surgical History:  Procedure Laterality Date  . CARPAL TUNNEL RELEASE Right 04/10/2019  . DIRECT LARYNGOSCOPY N/A 08/21/2019   Procedure: DIRECT LARYNGOSCOPY - WITH BX;  Surgeon: [name], MD;  Location: [place];  Service: ENT;  Laterality: N/A;  . TONSILLECTOMY Bilateral 08/21/2019   Procedure: TONSILLECTOMY;  Surgeon: [name], MD;  Location: [place];  Service: ENT;  Laterality: Bilateral;    There were no vitals filed for this visit.  Subjective Assessment - 09/06/19 0816    Subjective  I have arthritis on the left shoulder and impingement    Pertinent History  Rt tonsil cancer post tonsillectomy and adnoidectomy 08/21/19 with scheduled neck dissection on 09/11/19.  Radiation and chemotherapy most likely    Patient Stated Goals  learn pre op    Currently in Pain?  Yes    Pain Score  6     Pain Location  Neck    Pain Orientation  Right    Pain Descriptors / Indicators  Aching;Sore    Pain Type  Other (Comment)   cancer pain   Pain Onset  1 to 4 weeks ago    Pain Frequency  Constant         [facility] PT Assessment - 09/06/19 0001      Assessment   Medical Diagnosis  tonsil cancer Rt     Referring Provider (PT)  Dr. Isidore Moos    Onset Date/Surgical Date  08/23/19    Hand Dominance  Right    Prior Therapy  no      Precautions   Precaution Comments  lymphedema      Restrictions   Weight Bearing Restrictions  No      Balance Screen   Has the patient fallen in the past 6 months  Yes   I'm just tired   How many times?  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[employer], computer work,       Charity fundraiser Status  Within [employer] for tasks assessed      Observation/Other Assessments   Observations  enlarged Rt tonsil and neck due to tumor      Coordination   Gross Motor Movements are Fluid and Coordinated  Yes      Posture/Postural Control   Posture/Postural Control  Postural limitations    Postural Limitations  Rounded Shoulders;Forward head;Increased thoracic kyphosis      ROM / Strength   AROM / PROM / Strength  AROM      AROM   Overall AROM Comments  Shoulder screen full bilaterally no pain    AROM Assessment Site  Cervical    Cervical Flexion  60    Cervical Extension  36   tumor mass pain   Cervical - Right Side Bend  33    Cervical - Left Side Bend  37    Cervical - Right Rotation  60    Cervical - Left Rotation  55   tumor mass pain       LYMPHEDEMA/ONCOLOGY QUESTIONNAIRE - 09/06/19 0828      Lymphedema Assessments   Lymphedema Assessments  Head and Neck      Head and Neck   4 cm superior to sternal notch around neck  47.2 cm    6 cm superior to sternal notch around neck  46.5 cm    8 cm superior to sternal notch around neck  44.8 cm                        PT Education - 09/06/19 0847    Education Details  walking program, head and neck  post op stretches, postural considerations, lymphedema    Person(s) Educated  Patient    Methods  Explanation;Demonstration;Tactile cues;Verbal cues;Handout    Comprehension  Verbalized understanding;Verbal cues required              Head and Neck Clinic Goals - 09/06/19 [id number]      Patient will be able to verbalize understanding of a home exercise program for cervical range of motion, posture, and walking.    Status  Achieved      Patient will be able to verbalize understanding of proper sitting and standing posture.    Status  Achieved      Patient will be able to verbalize understanding of lymphedema risk and availability of treatment for this condition.    Status  Achieved        Plan - 09/06/19 0849    Clinical Impression Statement  Pt presents post tonsillectomy with neck dissection and tumor removal on Monday of next week due to Rt tonsilar cancer.  Pt will most likely have radiation and chemotherapy.  Pt overall is limited in movement of the cervical spine by his large Rt sided tonsilar tumor mass but overall has good movement in the neck and shoulders.  Pt has been out of work due to carpal tunnel on the Rt arm but would like to go back to work after cancer treatment.  Pt is unable to perform light duty at work so pt was educated about return to PT if needed for strength and endurance.  Pt is alsoaware of lymphedema considerations and when to return to PT.    Stability/Clinical Decision Making  Stable/Uncomplicated    Clinical Decision Making  Low    Rehab Potential  Excellent    PT Frequency  One time visit    PT Treatment/Interventions  ADLs/Self Care Home Management    PT Next Visit Plan  Reassess if pt returns    Consulted and Agree with Plan of Care  Patient       Patient will benefit from skilled therapeutic intervention in order to  improve the following deficits and impairments:  Postural dysfunction, Decreased range of motion  Visit  Diagnosis: Abnormal posture  Neck stiffness     Problem List Patient Active Problem List   Diagnosis Date Noted  . Squamous cell carcinoma of right tonsil ([place]) 09/04/2019  . S/P tonsillectomy 08/21/2019  . Squamous cell carcinoma of head and neck ([place]) 08/10/2019  . Morbid obesity ([place]) 06/08/2019  . Moderate mixed hyperlipidemia not requiring statin therapy 06/08/2019  . OSA (obstructive sleep apnea) 06/08/2019  . Change in stool 02/22/2017  . Gout 02/22/2017    Stark Bray 09/06/2019, 8:53 AM  [place], [place], [address] Phone: [phone]   Fax:  [phone]  Name: Dustin Nova Sr. MRN: [id number] Date of Birth: 04-Aug-1965

## 2019-09-06 NOTE — Patient Instructions (Signed)
Post operative Head and Neck stretches and postural considerations  Walking program information  When to return to PT

## 2019-09-06 NOTE — Telephone Encounter (Signed)
I signed. It is on my desk. WS

## 2019-09-06 NOTE — Telephone Encounter (Signed)
Faxed to [place]

## 2019-09-06 NOTE — Therapy (Signed)
[place], [place], [address] Phone: [phone]   Fax:  [phone]  Physical Therapy Evaluation  Patient Details  Name: Dustin Longmore Sr. MRN: [id number] Date of Birth: 09-25-1965 Referring Provider (PT): Dr. Isidore Moos   Encounter Date: 09/06/2019  PT End of Session - 09/06/19 0849    Visit Number  1    Number of Visits  1    PT Start Time  0806    PT Stop Time  0841    PT Time Calculation (min)  35 min    Activity Tolerance  Patient tolerated treatment well    Behavior During Therapy  [facility] for tasks assessed/performed       Past Medical History:  Diagnosis Date  . Change in stool 02/22/2017  . Gout 02/22/2017  . Hyperlipidemia   . Sleep apnea    Uses CPAP    Past Surgical History:  Procedure Laterality Date  . CARPAL TUNNEL RELEASE Right 04/10/2019  . DIRECT LARYNGOSCOPY N/A 08/21/2019   Procedure: DIRECT LARYNGOSCOPY - WITH BX;  Surgeon: [name], MD;  Location: [place];  Service: ENT;  Laterality: N/A;  . TONSILLECTOMY Bilateral 08/21/2019   Procedure: TONSILLECTOMY;  Surgeon: [name], MD;  Location: [place];  Service: ENT;  Laterality: Bilateral;    There were no vitals filed for this visit.   Subjective Assessment - 09/06/19 0816    Subjective  I have arthritis on the left shoulder and impingement    Pertinent History  Rt tonsil cancer post tonsillectomy and adnoidectomy 08/21/19 with scheduled neck dissection on 09/11/19.  Radiation and chemotherapy most likely    Patient Stated Goals  learn pre op    Currently in Pain?  Yes    Pain Score  6     Pain Location  Neck    Pain Orientation  Right    Pain Descriptors / Indicators  Aching;Sore    Pain Type  Other (Comment)   cancer pain   Pain Onset  1 to 4 weeks ago    Pain Frequency  Constant         [facility] PT Assessment - 09/06/19 0001      Assessment   Medical Diagnosis  tonsil cancer  Rt    Referring Provider (PT)  Dr. Isidore Moos    Onset Date/Surgical Date  08/23/19    Hand Dominance  Right    Prior Therapy  no      Precautions   Precaution Comments  lymphedema      Restrictions   Weight Bearing Restrictions  No      Balance Screen   Has the patient fallen in the past 6 months  Yes   I'm just tired   How many times?  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[employer], computer work,       Charity fundraiser Status  Within [employer] for tasks assessed      Observation/Other Assessments   Observations  enlarged Rt tonsil and neck due to tumor      Coordination   Gross Motor Movements are Fluid and Coordinated  Yes      Posture/Postural Control   Posture/Postural Control  Postural limitations    Postural Limitations  Rounded Shoulders;Forward head;Increased thoracic kyphosis      ROM / Strength   AROM / PROM / Strength  AROM      AROM   Overall AROM Comments  Shoulder screen full bilaterally no pain    AROM Assessment Site  Cervical    Cervical Flexion  60    Cervical Extension  36   tumor mass pain   Cervical - Right Side Bend  33    Cervical - Left Side Bend  37    Cervical - Right Rotation  60    Cervical - Left Rotation  55   tumor mass pain       LYMPHEDEMA/ONCOLOGY QUESTIONNAIRE - 09/06/19 [id number]      Lymphedema Assessments   Lymphedema Assessments  Head and Neck      Head and Neck   4 cm superior to sternal notch around neck  47.2 cm    6 cm superior to sternal notch around neck  46.5 cm    8 cm superior to sternal notch around neck  44.8 cm             Objective measurements completed on examination: See above findings.              PT  Education - 09/06/19 0847    Education Details  walking program, head and neck post op stretches, postural considerations, lymphedema    Person(s) Educated  Patient    Methods  Explanation;Demonstration;Tactile cues;Verbal cues;Handout    Comprehension  Verbalized understanding;Verbal cues required              Head and Neck Clinic Goals - 09/06/19 [id number]      Patient will be able to verbalize understanding of a home exercise program for cervical range of motion, posture, and walking.    Status  Achieved      Patient will be able to verbalize understanding of proper sitting and standing posture.    Status  Achieved      Patient will be able to verbalize understanding of lymphedema risk and availability of treatment for this condition.    Status  Achieved         Plan - 09/06/19 0849    Clinical Impression Statement  Pt presents post tonsillectomy with neck dissection and tumor removal on Monday of next week due to Rt tonsilar cancer.  Pt will most likely have radiation and chemotherapy.  Pt overall is limited in movement of the cervical spine by his large Rt sided tonsilar tumor mass but overall has good movement in the neck and shoulders.  Pt has been out of work due to carpal tunnel on the Rt arm but would like to go back to work after cancer treatment.  Pt is unable to perform light duty at work so pt was educated about return to PT if needed for strength and endurance.  Pt is alsoaware of lymphedema considerations and when to return to PT.    Stability/Clinical Decision Making  Stable/Uncomplicated    Clinical Decision Making  Low    Rehab Potential  Excellent    PT Frequency  One time visit    PT Treatment/Interventions  ADLs/Self Care Home Management    PT Next Visit Plan  Reassess if pt returns    Consulted and Agree with Plan of Care  Patient  Patient will benefit from skilled therapeutic intervention in order to improve the following deficits  and impairments:  Postural dysfunction, Decreased range of motion  Visit Diagnosis: Abnormal posture  Neck stiffness     Problem List Patient Active Problem List   Diagnosis Date Noted  . Squamous cell carcinoma of right tonsil ([place]) 09/04/2019  . S/P tonsillectomy 08/21/2019  . Squamous cell carcinoma of head and neck ([place]) 08/10/2019  . Morbid obesity ([place]) 06/08/2019  . Moderate mixed hyperlipidemia not requiring statin therapy 06/08/2019  . OSA (obstructive sleep apnea) 06/08/2019  . Change in stool 02/22/2017  . Gout 02/22/2017    Shan Levans, PT 09/06/2019, 8:53 AM  [place], [place], [address] Phone: [phone]   Fax:  [phone]  Name: Dustin Alsup Sr. MRN: [id number] Date of Birth: [birth year]

## 2019-09-07 LAB — SURGICAL PATHOLOGY

## 2019-09-08 ENCOUNTER — Other Ambulatory Visit: Payer: Self-pay

## 2019-09-08 ENCOUNTER — Encounter (HOSPITAL_COMMUNITY): Payer: Self-pay | Admitting: Otolaryngology

## 2019-09-08 ENCOUNTER — Other Ambulatory Visit (HOSPITAL_COMMUNITY)
Admission: RE | Admit: 2019-09-08 | Discharge: 2019-09-08 | Disposition: A | Discharge status: Home / Self Care | Payer: 59 | Source: Ambulatory Visit | Attending: Otolaryngology | Admitting: Otolaryngology

## 2019-09-08 DIAGNOSIS — Z01812 Encounter for preprocedural laboratory examination: Secondary | ICD-10-CM | POA: Insufficient documentation

## 2019-09-08 DIAGNOSIS — Z20822 Contact with and (suspected) exposure to covid-19: Secondary | ICD-10-CM | POA: Insufficient documentation

## 2019-09-08 LAB — SARS CORONAVIRUS 2 (TAT 6-24 HRS): SARS Coronavirus 2: NEGATIVE

## 2019-09-08 NOTE — Progress Notes (Signed)
Pt denies SOB, chest pain, and being under the care of a cardiologist. Pt denies having a stress test, echo and cardiac cath. Pt denies having an EKG and chest x ray. Pt denies recent labs. Pt made aware to stop taking Aspirin (unless otherwise advised by surgeon), vitamins, fish oil ( Maxepa), Grape Seed and herbal medications. Do not take any NSAIDs ie: Ibuprofen, Advil, Naproxen (Aleve), Motrin, BC and Goody Powder. Pt reminded to quarantine. Pt verbalized understanding of all pre-op instructions.

## 2019-09-10 ENCOUNTER — Encounter (HOSPITAL_COMMUNITY): Payer: Self-pay | Admitting: Otolaryngology

## 2019-09-11 ENCOUNTER — Encounter (HOSPITAL_COMMUNITY)
Admission: RE | Disposition: A | Discharge status: Home / Self Care | Payer: Self-pay | Source: Home / Self Care | Attending: Otolaryngology

## 2019-09-11 ENCOUNTER — Other Ambulatory Visit: Payer: Self-pay | Admitting: Anatomic Pathology & Clinical Pathology

## 2019-09-11 ENCOUNTER — Inpatient Hospital Stay (HOSPITAL_COMMUNITY)
Admission: RE | Admit: 2019-09-11 | Discharge: 2019-09-14 | DRG: 141 | Disposition: A | Discharge status: Home / Self Care | Payer: 59 | Attending: Otolaryngology | Admitting: Otolaryngology

## 2019-09-11 ENCOUNTER — Other Ambulatory Visit: Payer: Self-pay

## 2019-09-11 ENCOUNTER — Inpatient Hospital Stay (HOSPITAL_COMMUNITY): Payer: 59 | Admitting: Certified Registered Nurse Anesthetist

## 2019-09-11 ENCOUNTER — Encounter (HOSPITAL_COMMUNITY): Payer: Self-pay | Admitting: Otolaryngology

## 2019-09-11 DIAGNOSIS — Z79899 Other long term (current) drug therapy: Secondary | ICD-10-CM | POA: Diagnosis not present

## 2019-09-11 DIAGNOSIS — Z20822 Contact with and (suspected) exposure to covid-19: Secondary | ICD-10-CM | POA: Diagnosis present

## 2019-09-11 DIAGNOSIS — C099 Malignant neoplasm of tonsil, unspecified: Secondary | ICD-10-CM | POA: Diagnosis present

## 2019-09-11 DIAGNOSIS — M109 Gout, unspecified: Secondary | ICD-10-CM | POA: Diagnosis present

## 2019-09-11 DIAGNOSIS — C770 Secondary and unspecified malignant neoplasm of lymph nodes of head, face and neck: Secondary | ICD-10-CM | POA: Diagnosis present

## 2019-09-11 DIAGNOSIS — H9190 Unspecified hearing loss, unspecified ear: Secondary | ICD-10-CM | POA: Diagnosis present

## 2019-09-11 DIAGNOSIS — M1990 Unspecified osteoarthritis, unspecified site: Secondary | ICD-10-CM | POA: Diagnosis present

## 2019-09-11 DIAGNOSIS — C090 Malignant neoplasm of tonsillar fossa: Secondary | ICD-10-CM | POA: Diagnosis present

## 2019-09-11 HISTORY — PX: RADICAL NECK DISSECTION: SHX2284

## 2019-09-11 HISTORY — DX: Unspecified osteoarthritis, unspecified site: M19.90

## 2019-09-11 HISTORY — PX: NECK DISSECTION: SUR422

## 2019-09-11 HISTORY — DX: Malignant neoplasm of tonsil, unspecified: C09.9

## 2019-09-11 LAB — BASIC METABOLIC PANEL
Anion gap: 12 (ref 5–15)
BUN: 14 mg/dL (ref 6–20)
CO2: 23 mmol/L (ref 22–32)
Calcium: 8.7 mg/dL — ABNORMAL LOW (ref 8.9–10.3)
Chloride: 104 mmol/L (ref 98–111)
Creatinine, Ser: 1.1 mg/dL (ref 0.61–1.24)
GFR calc Af Amer: >60 mL/min (ref >60)
GFR calc non Af Amer: >60 mL/min (ref >60)
Glucose, Bld: 100 mg/dL — ABNORMAL HIGH (ref 70–99)
Potassium: 4 mmol/L (ref 3.5–5.1)
Sodium: 139 mmol/L (ref 135–145)

## 2019-09-11 LAB — CBC
HCT: 45 % (ref 39.0–52.0)
Hemoglobin: 14.4 g/dL (ref 13.0–17.0)
MCH: 29.6 pg (ref 26.0–34.0)
MCHC: 32 g/dL (ref 30.0–36.0)
MCV: 92.6 fL (ref 80.0–100.0)
Platelets: 202 10*3/uL (ref 150–400)
RBC: 4.86 MIL/uL (ref 4.22–5.81)
RDW: 12.8 % (ref 11.5–15.5)
WBC: 6.4 10*3/uL (ref 4.0–10.5)
nRBC: 0 % (ref 0.0–0.2)

## 2019-09-11 SURGERY — DISSECTION, NECK, RADICAL
Anesthesia: General | Site: Neck | Laterality: Right

## 2019-09-11 MED ORDER — PROPOFOL 10 MG/ML IV BOLUS
INTRAVENOUS | Status: DC | PRN
  Administered 2019-09-11 (×2): 20 mg via INTRAVENOUS
  Administered 2019-09-11: 180 mg via INTRAVENOUS
  Administered 2019-09-11: 20 mg via INTRAVENOUS

## 2019-09-11 MED ORDER — MIDAZOLAM HCL 2 MG/2ML IJ SOLN
INTRAMUSCULAR | Status: AC
  Filled 2019-09-11: qty 2

## 2019-09-11 MED ORDER — HYDROCODONE-ACETAMINOPHEN 5-325 MG PO TABS: 1.0000 | ORAL_TABLET | ORAL | Status: DC | PRN

## 2019-09-11 MED ORDER — IBUPROFEN 100 MG/5ML PO SUSP: 400.0000 mg | Freq: Four times a day (QID) | ORAL | Status: DC | PRN

## 2019-09-11 MED ORDER — FENTANYL CITRATE (PF) 250 MCG/5ML IJ SOLN
INTRAMUSCULAR | Status: AC
  Filled 2019-09-11: qty 5

## 2019-09-11 MED ORDER — ACETAMINOPHEN 10 MG/ML IV SOLN: 1000.0000 mg | Freq: Once | INTRAVENOUS | Status: DC | PRN

## 2019-09-11 MED ORDER — HYDROCODONE-ACETAMINOPHEN 7.5-325 MG/15ML PO SOLN
15.0000 mL | Freq: Four times a day (QID) | ORAL | Status: DC | PRN
  Administered 2019-09-11 – 2019-09-14 (×4): 15 mL via ORAL
  Filled 2019-09-11 (×5): qty 15

## 2019-09-11 MED ORDER — PROMETHAZINE HCL 25 MG PO TABS: 25.0000 mg | ORAL_TABLET | Freq: Four times a day (QID) | ORAL | Status: DC | PRN

## 2019-09-11 MED ORDER — OMEGA-3-ACID ETHYL ESTERS 1 G PO CAPS
1.0000 | ORAL_CAPSULE | Freq: Every day | ORAL | Status: DC
  Administered 2019-09-14: 09:00:00 1 g via ORAL
  Filled 2019-09-11 (×3): qty 1

## 2019-09-11 MED ORDER — OXYCODONE HCL 5 MG PO TABS: 5.0000 mg | ORAL_TABLET | Freq: Once | ORAL | Status: DC | PRN

## 2019-09-11 MED ORDER — OXYCODONE HCL 5 MG/5ML PO SOLN: 5.0000 mg | Freq: Once | ORAL | Status: DC | PRN

## 2019-09-11 MED ORDER — COLCHICINE 0.6 MG PO TABS: 0.6000 mg | ORAL_TABLET | ORAL | Status: DC | PRN

## 2019-09-11 MED ORDER — DEXTROSE-NACL 5-0.9 % IV SOLN: INTRAVENOUS | Status: DC

## 2019-09-11 MED ORDER — FENTANYL CITRATE (PF) 100 MCG/2ML IJ SOLN
25.0000 ug | INTRAMUSCULAR | Status: DC | PRN
  Administered 2019-09-11: 50 ug via INTRAVENOUS

## 2019-09-11 MED ORDER — IBUPROFEN 600 MG PO TABS: 600.0000 mg | ORAL_TABLET | Freq: Three times a day (TID) | ORAL | Status: DC | PRN

## 2019-09-11 MED ORDER — LIDOCAINE 2% (20 MG/ML) 5 ML SYRINGE
INTRAMUSCULAR | Status: DC | PRN
  Administered 2019-09-11: 80 mg via INTRAVENOUS

## 2019-09-11 MED ORDER — ROCURONIUM BROMIDE 10 MG/ML (PF) SYRINGE
PREFILLED_SYRINGE | INTRAVENOUS | Status: AC
  Filled 2019-09-11: qty 10

## 2019-09-11 MED ORDER — VITAMIN B-12 1000 MCG PO TABS
1000.0000 ug | ORAL_TABLET | Freq: Every day | ORAL | Status: DC
  Administered 2019-09-14: 1000 ug via ORAL
  Filled 2019-09-11 (×3): qty 1

## 2019-09-11 MED ORDER — FENTANYL CITRATE (PF) 100 MCG/2ML IJ SOLN
INTRAMUSCULAR | Status: DC | PRN
  Administered 2019-09-11 (×6): 50 ug via INTRAVENOUS
  Administered 2019-09-11: 150 ug via INTRAVENOUS

## 2019-09-11 MED ORDER — PHENYLEPHRINE HCL-NACL 10-0.9 MG/250ML-% IV SOLN
INTRAVENOUS | Status: DC | PRN
  Administered 2019-09-11: 20 ug/min via INTRAVENOUS

## 2019-09-11 MED ORDER — PROMETHAZINE HCL 25 MG RE SUPP
25.0000 mg | Freq: Four times a day (QID) | RECTAL | Status: DC | PRN
  Filled 2019-09-11: qty 1

## 2019-09-11 MED ORDER — SUGAMMADEX SODIUM 500 MG/5ML IV SOLN
INTRAVENOUS | Status: AC
  Filled 2019-09-11: qty 5

## 2019-09-11 MED ORDER — ONDANSETRON HCL 4 MG/2ML IJ SOLN
INTRAMUSCULAR | Status: DC | PRN
  Administered 2019-09-11: 4 mg via INTRAVENOUS

## 2019-09-11 MED ORDER — FENTANYL CITRATE (PF) 100 MCG/2ML IJ SOLN
INTRAMUSCULAR | Status: AC
  Filled 2019-09-11: qty 2

## 2019-09-11 MED ORDER — LACTATED RINGERS IV SOLN: INTRAVENOUS | Status: DC

## 2019-09-11 MED ORDER — PROPOFOL 10 MG/ML IV BOLUS
INTRAVENOUS | Status: AC
  Filled 2019-09-11: qty 40

## 2019-09-11 MED ORDER — ONDANSETRON HCL 4 MG/2ML IJ SOLN
INTRAMUSCULAR | Status: AC
  Filled 2019-09-11: qty 2

## 2019-09-11 MED ORDER — SUGAMMADEX SODIUM 500 MG/5ML IV SOLN
INTRAVENOUS | Status: DC | PRN
  Administered 2019-09-11: 500 mg via INTRAVENOUS

## 2019-09-11 MED ORDER — PROMETHAZINE HCL 25 MG RE SUPP: 25.0000 mg | Freq: Four times a day (QID) | RECTAL | Status: DC | PRN

## 2019-09-11 MED ORDER — LIDOCAINE 2% (20 MG/ML) 5 ML SYRINGE
INTRAMUSCULAR | Status: AC
  Filled 2019-09-11: qty 5

## 2019-09-11 MED ORDER — ACETAMINOPHEN 500 MG PO TABS: 1000.0000 mg | ORAL_TABLET | Freq: Once | ORAL | Status: DC | PRN

## 2019-09-11 MED ORDER — ROCURONIUM BROMIDE 100 MG/10ML IV SOLN
INTRAVENOUS | Status: DC | PRN
  Administered 2019-09-11: 100 mg via INTRAVENOUS

## 2019-09-11 MED ORDER — ACETAMINOPHEN 160 MG/5ML PO SOLN: 1000.0000 mg | Freq: Once | ORAL | Status: DC | PRN

## 2019-09-11 MED ORDER — DEXAMETHASONE SODIUM PHOSPHATE 10 MG/ML IJ SOLN
INTRAMUSCULAR | Status: DC | PRN
  Administered 2019-09-11: 10 mg via INTRAVENOUS

## 2019-09-11 MED ORDER — BACITRACIN ZINC 500 UNIT/GM EX OINT
TOPICAL_OINTMENT | CUTANEOUS | Status: AC
  Filled 2019-09-11: qty 28.35

## 2019-09-11 MED ORDER — MIDAZOLAM HCL 5 MG/5ML IJ SOLN
INTRAMUSCULAR | Status: DC | PRN
  Administered 2019-09-11: 2 mg via INTRAVENOUS

## 2019-09-11 MED ORDER — DEXAMETHASONE SODIUM PHOSPHATE 10 MG/ML IJ SOLN
INTRAMUSCULAR | Status: AC
  Filled 2019-09-11: qty 1

## 2019-09-11 MED ORDER — LIDOCAINE-EPINEPHRINE 1 %-1:100000 IJ SOLN
INTRAMUSCULAR | Status: AC
  Filled 2019-09-11: qty 1

## 2019-09-11 MED ORDER — ASCORBIC ACID 500 MG PO TABS
1000.0000 mg | ORAL_TABLET | Freq: Every day | ORAL | Status: DC
  Filled 2019-09-11 (×3): qty 2

## 2019-09-11 MED ORDER — BACITRACIN ZINC 500 UNIT/GM EX OINT
1.0000 "application " | TOPICAL_OINTMENT | Freq: Three times a day (TID) | CUTANEOUS | Status: DC
  Administered 2019-09-11 – 2019-09-14 (×9): 1 via TOPICAL
  Filled 2019-09-11 (×2): qty 28.35

## 2019-09-11 MED ORDER — LORATADINE 10 MG PO TABS
10.0000 mg | ORAL_TABLET | Freq: Every day | ORAL | Status: DC
  Filled 2019-09-11 (×3): qty 1

## 2019-09-11 SURGICAL SUPPLY — 54 items
APPLIER CLIP 9.375 SM OPEN (CLIP)
APR CLP SM 9.3 20 MLT OPN (CLIP)
CANISTER SUCT 3000ML PPV (MISCELLANEOUS) ×3 IMPLANT
CLEANER TIP ELECTROSURG 2X2 (MISCELLANEOUS) ×3 IMPLANT
CLIP APPLIE 9.375 SM OPEN (CLIP) IMPLANT
CNTNR URN SCR LID CUP LEK RST (MISCELLANEOUS) IMPLANT
CONT SPEC 4OZ STRL OR WHT (MISCELLANEOUS)
CORD BIPOLAR FORCEPS 12FT (ELECTRODE) ×3 IMPLANT
COVER SURGICAL LIGHT HANDLE (MISCELLANEOUS) ×3 IMPLANT
COVER WAND RF STERILE (DRAPES) ×3 IMPLANT
DRAIN CHANNEL 15F RND FF W/TCR (WOUND CARE) IMPLANT
DRAIN SNY 10 ROU (WOUND CARE) IMPLANT
DRAIN WOUND SNY 15 RND (WOUND CARE) ×2 IMPLANT
DRAPE HALF SHEET 40X57 (DRAPES) IMPLANT
DRAPE INCISE 23X17 IOBAN STRL (DRAPES)
DRAPE INCISE 23X17 STRL (DRAPES) IMPLANT
DRAPE INCISE IOBAN 23X17 STRL (DRAPES) IMPLANT
ELECT COATED BLADE 2.86 ST (ELECTRODE) ×3 IMPLANT
ELECT REM PT RETURN 9FT ADLT (ELECTROSURGICAL) ×3
ELECTRODE REM PT RTRN 9FT ADLT (ELECTROSURGICAL) ×1 IMPLANT
EVACUATOR SILICONE 100CC (DRAIN) ×5 IMPLANT
FORCEPS BIPOLAR SPETZLER 8 1.0 (NEUROSURGERY SUPPLIES) ×3 IMPLANT
GAUZE 4X4 16PLY RFD (DISPOSABLE) ×19 IMPLANT
GLOVE BIO SURGEON STRL SZ 6.5 (GLOVE) ×2 IMPLANT
GLOVE BIO SURGEONS STRL SZ 6.5 (GLOVE) ×1
GLOVE ECLIPSE 7.5 STRL STRAW (GLOVE) ×3 IMPLANT
GOWN STRL REUS W/ TWL LRG LVL3 (GOWN DISPOSABLE) ×2 IMPLANT
GOWN STRL REUS W/TWL LRG LVL3 (GOWN DISPOSABLE) ×6
KIT BASIN OR (CUSTOM PROCEDURE TRAY) ×3 IMPLANT
KIT TURNOVER KIT B (KITS) ×3 IMPLANT
LOCATOR NERVE 3 VOLT (DISPOSABLE) IMPLANT
NDL PRECISIONGLIDE 27X1.5 (NEEDLE) ×1 IMPLANT
NEEDLE PRECISIONGLIDE 27X1.5 (NEEDLE) ×3 IMPLANT
NS IRRIG 1000ML POUR BTL (IV SOLUTION) ×3 IMPLANT
PAD ARMBOARD 7.5X6 YLW CONV (MISCELLANEOUS) ×2 IMPLANT
PENCIL FOOT CONTROL (ELECTRODE) ×3 IMPLANT
SHEARS HARMONIC 9CM CVD (BLADE) ×3 IMPLANT
SPECIMEN JAR MEDIUM (MISCELLANEOUS) IMPLANT
SPONGE INTESTINAL PEANUT (DISPOSABLE) IMPLANT
SPONGE LAP 18X18 RF (DISPOSABLE) IMPLANT
STAPLER VISISTAT 35W (STAPLE) ×3 IMPLANT
SUT CHROMIC 3 0 SH 27 (SUTURE) ×5 IMPLANT
SUT CHROMIC 5 0 P 3 (SUTURE) IMPLANT
SUT ETHILON 3 0 PS 1 (SUTURE) ×3 IMPLANT
SUT ETHILON 5 0 PS 2 18 (SUTURE) IMPLANT
SUT SILK 2 0 REEL (SUTURE) IMPLANT
SUT SILK 2 0 SH CR/8 (SUTURE) ×2 IMPLANT
SUT SILK 3 0 SH CR/8 (SUTURE) ×3 IMPLANT
SUT SILK 4 0 REEL (SUTURE) ×9 IMPLANT
SUT VIC AB 3-0 SH 18 (SUTURE) IMPLANT
TRAY ENT MC OR (CUSTOM PROCEDURE TRAY) ×3 IMPLANT
TRAY FOLEY MTR SLVR 14FR STAT (SET/KITS/TRAYS/PACK) IMPLANT
TUBE FEEDING 10FR FLEXIFLO (MISCELLANEOUS) IMPLANT
YANKAUER SUCT BULB TIP NO VENT (SUCTIONS) ×2 IMPLANT

## 2019-09-11 NOTE — Transfer of Care (Signed)
Immediate Anesthesia Transfer of Care Note  Patient: Dustin Leiter Sr.  Procedure(s) Performed: RADICAL NECK DISSECTION (Right Neck)  Patient Location: PACU  Anesthesia Type:General  Level of Consciousness: drowsy  Airway & Oxygen Therapy: Patient Spontanous Breathing and Patient connected to face mask oxygen  Post-op Assessment: Report given to RN and Post -op Vital signs reviewed and stable  Post vital signs: Reviewed and stable  Last Vitals:  Vitals Value Taken Time  BP 128/76 09/11/19 1418  Temp    Pulse 90 09/11/19 1422  Resp 14 09/11/19 1422  SpO2 97 % 09/11/19 1422  Vitals shown include unvalidated device data.  Last Pain:  Vitals:   09/11/19 0905  TempSrc:   PainSc: 3       Patients Stated Pain Goal: 3 ([id number])  Complications: No apparent anesthesia complications

## 2019-09-11 NOTE — Interval H&P Note (Signed)
History and Physical Interval Note:  09/11/2019 10:03 AM  Dustin Leiter Sr.  has presented today for surgery, with the diagnosis of Metastatic squamous of the neck right sided.  The various methods of treatment have been discussed with the patient and family. After consideration of risks, benefits and other options for treatment, the patient has consented to  Procedure(s): RADICAL NECK DISSECTION (Right) as a surgical intervention.  The patient's history has been reviewed, patient examined, no change in status, stable for surgery.  I have reviewed the patient's chart and labs.  Questions were answered to the patient's satisfaction.     [name]

## 2019-09-11 NOTE — Anesthesia Preprocedure Evaluation (Addendum)
Anesthesia Evaluation  Patient identified by MRN, date of birth, ID band Patient awake    Reviewed: Allergy & Precautions, NPO status , Patient's Chart, lab work & pertinent test results  History of Anesthesia Complications Negative for: history of anesthetic complications  Airway Mallampati: IV  TM Distance: >3 FB Neck ROM: Full  Mouth opening: Limited Mouth Opening  Dental  (+) Dental Advisory Given   Pulmonary neg shortness of breath, sleep apnea , neg recent URI,    breath sounds clear to auscultation       Cardiovascular (-) angina(-) Past MI and (-) CHF negative cardio ROS  (-) dysrhythmias  Rhythm:Regular     Neuro/Psych negative neurological ROS  negative psych ROS   GI/Hepatic negative GI ROS, Neg liver ROS,   Endo/Other  Morbid obesity  Renal/GU negative Renal ROS     Musculoskeletal  (+) Arthritis ,   Abdominal   Peds  Hematology negative hematology ROS (+)   Anesthesia Other Findings   Reproductive/Obstetrics                            Anesthesia Physical Anesthesia Plan  ASA: III  Anesthesia Plan: General   Post-op Pain Management:    Induction: Intravenous  PONV Risk Score and Plan: 2 and Ondansetron and Dexamethasone  Airway Management Planned: Oral ETT  Additional Equipment: Arterial line  Intra-op Plan:   Post-operative Plan: Extubation in OR  Informed Consent: I have reviewed the patients History and Physical, chart, labs and discussed the procedure including the risks, benefits and alternatives for the proposed anesthesia with the patient or authorized representative who has indicated his/her understanding and acceptance.     Dental advisory given  Plan Discussed with: CRNA and Surgeon  Anesthesia Plan Comments:         Anesthesia Quick Evaluation

## 2019-09-11 NOTE — Anesthesia Procedure Notes (Signed)
Procedure Name: Intubation Date/Time: 09/11/2019 10:44 AM Performed by: Candis Shine, CRNA Pre-anesthesia Checklist: Patient identified, Emergency Drugs available, Suction available, Patient being monitored and Timeout performed Patient Re-evaluated:Patient Re-evaluated prior to induction Oxygen Delivery Method: Circle system utilized Preoxygenation: Pre-oxygenation with 100% oxygen Induction Type: IV induction Ventilation: Mask ventilation without difficulty Laryngoscope Size: Glidescope and 3 Grade View: Grade II Tube type: Oral Tube size: 7.5 mm Number of attempts: 1 Airway Equipment and Method: Stylet and Video-laryngoscopy Placement Confirmation: ETT inserted through vocal cords under direct vision,  breath sounds checked- equal and bilateral and CO2 detector Secured at: 22 cm Tube secured with: Tape Dental Injury: Teeth and Oropharynx as per pre-operative assessment

## 2019-09-11 NOTE — Anesthesia Procedure Notes (Signed)
Arterial Line Insertion Start/End5/07/2019 10:47 AM, 09/11/2019 10:41 AM Performed by: Candis Shine, CRNA, CRNA  Patient location: OR. Preanesthetic checklist: patient identified, IV checked, site marked, risks and benefits discussed, surgical consent, monitors and equipment checked, pre-op evaluation, timeout performed and anesthesia consent Patient sedated Left, radial was placed Catheter size: 20 G Hand hygiene performed  and maximum sterile barriers used   Attempts: 1 Procedure performed without using ultrasound guided technique. Following insertion, dressing applied and Biopatch. Post procedure assessment: normal and unchanged  Patient tolerated the procedure well with no immediate complications.

## 2019-09-11 NOTE — Op Note (Signed)
OPERATIVE REPORT  DATE OF SURGERY: 09/11/2019  PATIENT:  Dustin Leiter Sr.,  54 y.o. male  PRE-OPERATIVE DIAGNOSIS:  Metastatic squamous cell carcinoma of the neck right sided  POST-OPERATIVE DIAGNOSIS:  metastatic squamous cell carcinoma of the neck right sided  PROCEDURE:  Procedure(s): Modified RADICAL NECK DISSECTION  SURGEON:  Beckie Salts, MD  ASSISTANTS: Jolene Provost PA  ANESTHESIA:   General   EBL: 150 ml  DRAINS: 15 French round JP  LOCAL MEDICATIONS USED:  None  SPECIMEN: Right modified radical neck dissection including levels 1 through 4, including the internal jugular vein.  COUNTS:  Correct  PROCEDURE DETAILS: The patient was taken to the operating room and placed on the operating table in the supine position. Following induction of general endotracheal anesthesia, the neck was prepped and draped in a standard fashion.  Incision was outlined with a marking pen starting at the right mastoid tip coming down to a natural skin crease in the lower neck and across towards the left side.  Electrocautery was used to incise the skin subcutaneous tissue and through the platysma layer.  Subplatysmal flaps were elevated superiorly to the mandible and inferior to the clavicle.  The great auricular nerve was preserved.  The sternocleidomastoid muscle was mostly preserved but a cuff of muscle was taken around the level 2 tumor mass.  The spinal accessory nerve could not be preserved.  The hypoglossal nerve was preserved.  The posterior belly of digastric muscle was taken to facilitate margins.  The internal jugular vein was also taken.  The upper and lower stumps were ligated using 2-0 suture ligature and 2 oh free tie on both sides.  The superior limit of dissection was the mandible.  The mandibular branch of the facial nerve was identified and preserved.  Submandibular gland and contents of the submandibular triangle were all taken all the way towards the left submental fat pad.   Strap muscles were preserved anteriorly.  Lymphatic ducts were not separately identified.  There is no evidence of chyle leak at the end of the procedure.  The common external/internal carotids were preserved.  The vagus nerve was preserved as well.  There are multiple enlarged lymph nodes and at all levels but the major pathology was present in level 2 with obvious extranodal extension.  It abutted up against the parotid tail.  The tumor mass was grossly removed although microscopic disease was likely present at the superior margin.  Specimen was oriented and sent for pathologic evaluation.  Wound was irrigated with saline.  Drain was exited through separate stab incision secured in place with nylon suture.  Incision was reapproximated using interrupted 3-0 chromic on the platysma layer and staples on the skin.  Patient was awakened extubated and transferred to recovery in stable condition.    PATIENT DISPOSITION:  To PACU, stable

## 2019-09-11 NOTE — Progress Notes (Signed)
   Subjective:    Patient ID: Dustin Leiter Sr., male    DOB: [birth year], 54 y.o.   MRN: [id number]  HPI Doing well.  Reports some throat soreness.   Review of Systems     Objective:   Physical Exam AF VSS Alert, NAD Neck incision clean and intact.  Drain functioning. Right lower lip and shoulder shrug with some weakness.     Assessment & Plan:  Right tonsil cancer s/p right modified radical neck dissection.  Doing well with functioning drain.  Observe until drain output decreases.

## 2019-09-12 ENCOUNTER — Encounter (HOSPITAL_COMMUNITY): Payer: Self-pay | Admitting: Otolaryngology

## 2019-09-12 NOTE — Progress Notes (Signed)
Patient ID: Dustin Nakashima Sr., male   DOB: 11-06-1965, 54 y.o.   MRN: [id number] Subjective: Complains of sore throat but able to breathe and swallow without difficulty.  No problems voiding.  He has been up walking around.  Objective: Vital signs in last 24 hours: Temp:  [98 F (36.7 C)-99.2 F (37.3 C)] 98.3 F (36.8 C) (05/04 0537) Pulse Rate:  [69-89] 69 (05/04 0537) Resp:  [12-20] 18 (05/04 0537) BP: (112-136)/(74-89) 122/74 (05/04 0537) SpO2:  [92 %-97 %] 96 % (05/04 0537) Arterial Line BP: (142-152)/(60-61) 152/61 (05/03 1434) Weight change:  Last BM Date: 09/11/19  Intake/Output from previous day: 05/03 0701 - 05/04 0700 In: 2760.7 [P.O.:960; I.V.:1560.7] Out: 1605 [Urine:1325; Drains:130; Blood:150] Intake/Output this shift: Total I/O In: -  Out: 20 [Drains:20]  PHYSICAL EXAM: Awake and alert.  Voice is clear.  Lower lip with slight weakness.  Incision looks excellent.  Tongue mobility normal.  JP drain functioning normally.  Lab Results: Recent Labs    09/11/19 0935  WBC 6.4  HGB 14.4  HCT 45.0  PLT 202   BMET Recent Labs    09/11/19 0935  NA 139  K 4.0  CL 104  CO2 23  GLUCOSE 100*  BUN 14  CREATININE 1.10  CALCIUM 8.7*    Studies/Results: No results found.  Medications: I have reviewed the patient's current medications.  Assessment/Plan: Stable postop.  Continue with drain and inpatient care.  He may stay until drain comes out or if he desires can go home with the drain and we can remove it in the office.  Continue care for now.  LOS: 1 day   [name] 09/12/2019, 8:56 AM

## 2019-09-13 MED ORDER — PHENOL 1.4 % MT LIQD
1.0000 | OROMUCOSAL | Status: DC | PRN
  Administered 2019-09-13 (×2): 1 via OROMUCOSAL
  Filled 2019-09-13: qty 177

## 2019-09-13 NOTE — Progress Notes (Signed)
Patient ID: Dustin Matsuyama Sr., male   DOB: Aug 21, 1965, 54 y.o.   MRN: [id number] Subjective: Still complains of sore throat, otherwise doing very well.  Objective: Vital signs in last 24 hours: Temp:  [97.7 F (36.5 C)-98.7 F (37.1 C)] 97.7 F (36.5 C) (05/05 0518) Pulse Rate:  [65-75] 69 (05/05 0518) Resp:  [16-18] 18 (05/05 0518) BP: (112-131)/(63-81) 120/72 (05/05 0518) SpO2:  [95 %-100 %] 96 % (05/05 0518) Weight change:  Last BM Date: 09/12/19  Intake/Output from previous day: 05/04 0701 - 05/05 0700 In: 1583.7 [P.O.:960; I.V.:623.7] Out: 90 [Drains:90] Intake/Output this shift: No intake/output data recorded.  PHYSICAL EXAM: Incision looks excellent.  No swelling or seroma.  Drain is functioning and the drainage has cleared.  Lab Results: Recent Labs    09/11/19 0935  WBC 6.4  HGB 14.4  HCT 45.0  PLT 202   BMET Recent Labs    09/11/19 0935  NA 139  K 4.0  CL 104  CO2 23  GLUCOSE 100*  BUN 14  CREATININE 1.10  CALCIUM 8.7*    Studies/Results: No results found.  Medications: I have reviewed the patient's current medications.  Assessment/Plan: Stable postop, continue care.  Discharge home when ready.  LOS: 2 days   [name] 09/13/2019, 9:10 AM

## 2019-09-13 NOTE — Anesthesia Postprocedure Evaluation (Signed)
Anesthesia Post Note  Patient: Dustin Leiter Sr.  Procedure(s) Performed: RADICAL NECK DISSECTION (Right Neck)     Patient location during evaluation: PACU Anesthesia Type: General Level of consciousness: awake and alert Pain management: pain level controlled Vital Signs Assessment: post-procedure vital signs reviewed and stable Respiratory status: spontaneous breathing, nonlabored ventilation, respiratory function stable and patient connected to nasal cannula oxygen Cardiovascular status: blood pressure returned to baseline and stable Postop Assessment: no apparent nausea or vomiting Anesthetic complications: no    Last Vitals:  Vitals:   09/13/19 0045 09/13/19 0518  BP: 122/78 120/72  Pulse: 65 69  Resp: 18 18  Temp: 36.7 C 36.5 C  SpO2: 97% 96%    Last Pain:  Vitals:   09/13/19 0838  TempSrc:   PainSc: 1                  Sahvanna Mcmanigal

## 2019-09-14 MED ORDER — HYDROCODONE-ACETAMINOPHEN 7.5-325 MG/15ML PO SOLN: 15.0000 mL | Freq: Four times a day (QID) | ORAL | 0 refills | Status: DC | PRN

## 2019-09-14 MED ORDER — PROMETHAZINE HCL 25 MG RE SUPP
25.0000 mg | Freq: Four times a day (QID) | RECTAL | 1 refills | Status: DC | PRN
Start: 2019-09-14 — End: 2020-03-21

## 2019-09-14 NOTE — Discharge Instructions (Signed)
Keep the neck clean and dry.  It is okay to shower but be careful not to scrub hard and to dab carefully with a towel afterwards to avoid snagging on the staple.  Keep dressing over the drain hole until there is no more drainage.  Apply antibiotic ointment twice daily to the staples and to the drain hole.

## 2019-09-14 NOTE — Discharge Summary (Signed)
Physician Discharge Summary  Patient ID: Dustin Chhabra Sr. MRN: [id number] DOB/AGE: 08-Feb-1966 54 y.o.  Admit date: 09/11/2019 Discharge date: 09/14/2019  Admission Diagnoses: Tonsil cancer  Discharge Diagnoses:  Active Problems:   Tonsil cancer [facility])   Discharged Condition: good  Hospital Course: No complications  Consults: none  Significant Diagnostic Studies: none  Treatments: surgery: Right modified radical neck dissection  Discharge Exam: Blood pressure (!) 107/57, pulse 67, temperature 98.1 F (36.7 C), temperature source Oral, resp. rate 15, height 5' 8.5" (1.74 m), weight 127 kg, SpO2 96 %. PHYSICAL EXAM: Awake and alert, breathing and voice are clear.  Incision intact with staples.  No swelling.  JP drain removed on day of discharge.  Disposition:     Follow-up Information    [name], MD. Schedule an appointment as soon as possible for a visit in 1 week(s).   Specialty: Otolaryngology Contact information: [address] [place] [address] [phone]           Signed: [name] 09/14/2019, 8:55 AM

## 2019-09-14 NOTE — Progress Notes (Signed)
[name] to be D/C'd  per MD order. Discussed with the patient and all questions fully answered.  VSS, Skin clean, dry and intact without evidence of skin break down, no evidence of skin tears noted.  IV catheter discontinued intact. Site without signs and symptoms of complications. Dressing and pressure applied.  An After Visit Summary was printed and given to the patient. Patient received prescription.  D/c education completed with patient/family including follow up instructions, medication list, d/c activities limitations if indicated, with other d/c instructions as indicated by MD - patient able to verbalize understanding, all questions fully answered.   Patient instructed to return to ED, call 911, or call MD for any changes in condition.   Patient to be escorted via [place], and D/C home via private auto.

## 2019-09-15 LAB — SURGICAL PATHOLOGY

## 2019-09-19 NOTE — Progress Notes (Signed)
Oncology Nurse Navigator Documentation  I spoke with Dustin Rios and his wife Dustin Rios today. I discussed the upcoming appointment with Dr. Isidore Moos on 10/10/19 with CT simulation following the appointment. I also discussed that they would be receiving a phone call from Medical Oncology with an appointment with a physician to discuss treatment. I also discussed attending [place] on 09/28/19 to see Garald Balding SLP at 8:45. They are aware of all appointments and know to call me if they have any further questions or concerns.   Harlow Asa RN, BSN, OCN Head & Neck Oncology Nurse [place] at [facility] Phone # [phone]  Fax # [phone]

## 2019-09-21 ENCOUNTER — Telehealth: Payer: Self-pay | Admitting: Hematology

## 2019-09-21 NOTE — Telephone Encounter (Signed)
Received a staff to schedule Dustin Rios to establish care with a new med oncologist for squamous cell carcinoma of the head and neck. Pt has been cld and scheduled to see Dr. Irene Limbo on 5/20 at 11am. Pt aware to arrive 15 minutes early.

## 2019-09-26 NOTE — Progress Notes (Signed)
Oncology Nurse Navigator Documentation  I spoke with Mr. Fieger today. I confirmed his appointments on Thursday 09/28/19. He will see Garald Balding SLP at 10:15 and Dr. Irene Limbo at 11:00. He voiced his understanding of the appointments and times. He knows to call me if he has any further questions or concerns.  Harlow Asa RN, BSN, OCN Head & Neck Oncology Nurse [place] at [facility] Phone # [phone]  Fax # [phone]

## 2019-09-27 NOTE — Progress Notes (Signed)
HEMATOLOGY/ONCOLOGY CONSULTATION NOTE  Date of Service: 09/28/2019  Patient Care Team: Claretta Fraise, MD as PCP - General (Family Medicine) Jodi Marble, MD as Consulting Physician (Otolaryngology) Eppie Gibson, MD as Attending Physician (Radiation Oncology) Tish Men, MD as Consulting Physician (Hematology) Leota Sauers, RN (Inactive) as Oncology Nurse Navigator [place], Stephani Police, RN as Oncology Nurse Navigator (Oncology)  REFERRING PHYSICIAN: Claretta Fraise, MD  CHIEF COMPLAINTS/PURPOSE OF CONSULTATION:  Squamous Cell Carcinoma of the Head and Neck  HISTORY OF PRESENTING ILLNESS:   Dustin Leiter Sr. is a wonderful 54 y.o. male who has been referred to [place] by Claretta Fraise, MD for evaluation and management of Squamous Cell Carcinoma of the Head and Neck. Pt is accompanied today by Dustin Rios, wife. The pt reports that he is doing well overall.   The pt reports he is fine. Pt has had both of his COVID19 vaccine. He is healing well from surgery. Some of the nerves were damaged and he has been working it. Part of his salivary gland on the right side was taken out and it burns when he starts to eat. Pt has seen swallowing evaluation today prior to coming. He is back to eating whatever he wants to now. When swallowing he has a burning sensation. Pt has a sleep apnea machine that dries out his throat. Pt also cannot open mouth completely since the dissection.  Pt had a biopsy on March 17th and found it was a squamous cell carcinoma. From his surgery he has various burning, tingling and numbness around his head, neck and shoulder.   Pt has sleep apnea. He also had gout but stopped drinking tea and has not had a flair up since the end of last year. He has no other major medical problems and works with Forensic psychologist. He has had slight hearing loss over the years due to job and being in the [employer]. Pt has never smoked, but his mother and brohter smoked a lot and pt was exposed  to second hand smoke for about 23 years. Many of the pt's family members especially from his mother side have had cancer's that were very aggressive and they passed at early ages.   Of note prior to the patient's visit today, pt has had Surgical Pathology ([id number]) completed on 09/11/19 with results revealing "LYMPH NODE, MODIFIED RADICAL NECK I-IV, REGIONAL DISSECTION: Metastatic carcinoma in 3 of 49 lymph nodes (3/49), with extracapsular extension." Pt has had Surgical Pathology ([id number]) completed on 08/21/19 with results revealing "A. TONSIL, RIGHT: Squamous cell carcinoma, basaloid, p16 positive.  Margins not involved.  Carcinoma focally 1 mm from surgical margin.  See oncology table and comment.  B. TONSIL, LEFT:  Benign tonsil.  No malignancy identified. C. TONGUE, RIGHT BASE, BIOPSY:  Benign tonsillar tissue. No malignancy identified. D. TONGUE, LEFT BASE, BIOPSY: Benign tonsillar tissue.  No malignancy identified. "  Pt has had PET Skull Base to Thigh ([id number]) completed on 08/10/19 with results revealing "1. The 2.8 cm right level IIa lymph node has a maximum SUV of 28.4. A primary lesion is not readily seen. There is some asymmetric palatine tonsillar activity with the left tonsil having maximum SUV of 12.4 and the right 7.8, but no tonsillar mass is observed on the CT data. 2. No compelling findings of active malignancy in the chest, abdomen/pelvis, or skeleton. There is some accentuated anal activity which is likely physiologic given the lack of visible abnormality on the CT data. 3. Other imaging findings of potential  clinical significance: Diffuse hepatic steatosis. Hypodense left renal lesion is probably a cyst but technically too small to characterize." Pt has had [place] Core Biopsy ([id number]) completed on  07/26/19 with results revealing "Successful ultrasound-guided right cervical lymph node/lesion Biopsy." Pt has had of CT Soft Tissue Neck ([id number]) completed on 07/13/19  with results revealing "4.1 x 3.2 cm necrotic/cystic node or nodal conglomerate at the right level II station likely reflecting nodal metastatic disease. No definite primary mass is identified within the oral cavity, pharynx or larynx. Correlate with direct visualization and consider direct tissue sampling. Additionally, PET-CT may be helpful. Cervical spondylosis as described. A prominent C6-C7 posterior disc osteophyte contributes to at least moderate bony spinal canal stenosis. Multilevel neural foraminal narrowing."  Most recent lab results (09/11/19) of CBC is as follows: all values are WNL except for Glucose at 100, Calcium at 8.7  On review of systems, pt reports various tingling, numbness, burnings sensations around head neck and shoulder, weight loss, insomnia and denies back pain, abdominal pain and any other symptoms.   On PMHx the pt reports tonsillectomy 08/21/19, radical neck dissection 09/11/19, sleep apnea, gout, wisdom tooth extraction, carpal tunnel surgery    On Social Hx the pt reports never smoked, smoked marijuana 30 years ago, works Forensic psychologist, pt was in [employer]   On Family Hx the pt reports brother metastatic lung cancer at age 9, Cousins had liver cancer and pancreatic cancer in early 9's of age, mother has uterine cancer at age 31, grandmother on fathers side has stomach cancer, great grandmother had lung cancer   MEDICAL HISTORY:  Past Medical History:  Diagnosis Date  . Arthritis   . Change in stool 02/22/2017  . Gout 02/22/2017  . Hyperlipidemia   . Sleep apnea    Uses CPAP  . Tonsil cancer [facility])    metastatic     SURGICAL HISTORY: Past Surgical History:  Procedure Laterality Date  . CARPAL TUNNEL RELEASE Right 04/10/2019  . DIRECT LARYNGOSCOPY N/A 08/21/2019   Procedure: DIRECT LARYNGOSCOPY - WITH BX;  Surgeon: [name], MD;  Location: [facility];  Service: ENT;  Laterality: N/A;  . NECK DISSECTION  09/11/2019  .  RADICAL NECK DISSECTION Right 09/11/2019   Procedure: RADICAL NECK DISSECTION;  Surgeon: [name], MD;  Location: [facility];  Service: ENT;  Laterality: Right;  . TONSILLECTOMY Bilateral 08/21/2019   Procedure: TONSILLECTOMY;  Surgeon: [name], MD;  Location: [facility];  Service: ENT;  Laterality: Bilateral;  . WISDOM TOOTH EXTRACTION       SOCIAL HISTORY: Social History   Socioeconomic History  . Marital status: Married    Spouse name: Not on file  . Number of children: 1  . Years of education: Not on file  . Highest education level: Not on file  Occupational History  . Not on file  Tobacco Use  . Smoking status: Never Smoker  . Smokeless tobacco: Never Used  Substance and Sexual Activity  . Alcohol use: No  . Drug use: No  . Sexual activity: Not on file  Other Topics Concern  . Not on file  Social History Narrative   Patient is married and has 1 son.   Patient has never smoked, used tobacco products.   Patient does not drink.  Patient does not use illicit drugs.   Social Determinants of Health   Financial Resource Strain:   . Difficulty of Paying Living Expenses:   Food Insecurity:   . Worried  About Running Out of Food in the Last Year:   . [place] in the Last Year:   Transportation Needs:   . Lack of Transportation (Medical):   Marland Kitchen Lack of Transportation (Non-Medical):   Physical Activity:   . Days of Exercise per Week:   . Minutes of Exercise per Session:   Stress:   . Feeling of Stress :   Social Connections:   . Frequency of Communication with Friends and Family:   . Frequency of Social Gatherings with Friends and Family:   . Attends Religious Services:   . Active Member of Clubs or Organizations:   . Attends Archivist Meetings:   Marland Kitchen Marital Status:   Intimate Partner Violence:   . Fear of Current or Ex-Partner:   . Emotionally Abused:   Marland Kitchen Physically Abused:   . Sexually Abused:      FAMILY HISTORY: Family History   Problem Relation Age of Onset  . COPD Mother   . Uterine cancer Mother   . Lung cancer Brother   . Lung cancer Maternal Grandfather   . Stomach cancer Paternal Grandmother      ALLERGIES:   has No Known Allergies.   MEDICATIONS:  Current Outpatient Medications  Medication Sig Dispense Refill  . ibuprofen (ADVIL) 200 MG tablet Take 600 mg by mouth every 8 (eight) hours as needed for moderate pain.     Marland Kitchen loratadine (EQ LORATADINE) 10 MG tablet Take 10 mg by mouth daily.     . Ascorbic Acid (VITAMIN C) 1000 MG tablet Take 1,000 mg by mouth daily.    . Colchicine (MITIGARE) 0.6 MG CAPS Take 2 capsules at onset of flare. Then take 1 capsule 1 hour later.  May take 1 capsule up to twice daily thereafter. (Patient not taking: Reported on 09/28/2019) 60 capsule 2  . Grape Seed 100 MG CAPS Take 150 mg by mouth.    Marland Kitchen HYDROcodone-acetaminophen (HYCET) 7.5-325 mg/15 ml solution Take 15 mLs by mouth 4 (four) times daily as needed for moderate pain. (Patient not taking: Reported on 09/28/2019) 420 mL 0  . omega-3 fish oil (MAXEPA) 1000 MG CAPS capsule Take 1 capsule by mouth daily.    . promethazine (PHENERGAN) 25 MG suppository Place 1 suppository (25 mg total) rectally every 6 (six) hours as needed for nausea or vomiting. (Patient not taking: Reported on 09/28/2019) 12 suppository 1  . sodium fluoride (PREVIDENT 5000 PLUS) 1.1 % CREA dental cream Apply to tooth brush or in fluoride trays as directed.  Repeat nightly. Spit out excess-DO NOT swallow. DO NOT rinse afterwards. (Patient not taking: Reported on 09/28/2019) 2 Tube prn  . traZODone (DESYREL) 50 MG tablet Take 1 tablet (50 mg total) by mouth at bedtime as needed for sleep. 30 tablet 3  . vitamin B-12 (CYANOCOBALAMIN) 1000 MCG tablet Take 1,000 mcg by mouth daily.     No current facility-administered medications for this visit.     REVIEW OF SYSTEMS:   A 10+ POINT REVIEW OF SYSTEMS WAS OBTAINED including neurology, dermatology, psychiatry,  cardiac, respiratory, lymph, extremities, GI, GU, Musculoskeletal, constitutional, breasts, reproductive, HEENT.  All pertinent positives are noted in the HPI.  All others are negative.   PHYSICAL EXAMINATION: ECOG PERFORMANCE STATUS: 1 - Symptomatic but completely ambulatory  Vitals:   09/28/19 1120  BP: 137/79  Pulse: 76  Resp: 18  Temp: 99.9 F (37.7 C)  SpO2: 99%   Filed Weights   09/28/19 1120  Weight: 297  lb (134.7 kg)   Body mass index is 44.5 kg/m.  GENERAL:alert, in no acute distress and comfortable SKIN: no acute rashes, no significant lesions EYES: conjunctiva are pink and non-injected, sclera anicteric OROPHARYNX: MMM, no exudates, no oropharyngeal erythema or ulceration NECK: supple, no JVD LYMPH:  no palpable lymphadenopathy in the cervical, axillary or inguinal regions LUNGS: clear to auscultation b/l with normal respiratory effort HEART: regular rate & rhythm ABDOMEN:  normoactive bowel sounds , non tender, not distended. Extremity: no pedal edema PSYCH: alert & oriented x 3 with fluent speech NEURO: no focal motor/sensory deficits  LABORATORY DATA:  I have reviewed the data as listed  CBC Latest Ref Rng & Units 09/11/2019 08/16/2019 04/03/2019  WBC 4.0 - 10.5 K/uL 6.4 5.0 7.7  Hemoglobin 13.0 - 17.0 g/dL 14.4 14.4 15.5  Hematocrit 39.0 - 52.0 % 45.0 43.7 45.4  Platelets 150 - 400 K/uL 202 198 253    CMP Latest Ref Rng & Units 09/11/2019 08/16/2019 04/03/2019  Glucose 70 - 99 mg/dL 100(H) 112(H) 79  BUN 6 - 20 mg/dL 14 19 15   Creatinine 0.61 - 1.24 mg/dL 1.10 1.11 1.09  Sodium 135 - 145 mmol/L 139 142 140  Potassium 3.5 - 5.1 mmol/L 4.0 4.3 4.3  Chloride 98 - 111 mmol/L 104 105 101  CO2 22 - 32 mmol/L 23 27 26   Calcium 8.9 - 10.3 mg/dL 8.7(L) 8.8(L) 9.2  Total Protein 6.5 - 8.1 g/dL - 7.1 7.3  Total Bilirubin 0.3 - 1.2 mg/dL - 0.5 0.3  Alkaline Phos 38 - 126 U/L - 65 92  AST 15 - 41 U/L - 19 24  ALT 0 - 44 U/L - 24 32     RADIOGRAPHIC STUDIES: I  have personally reviewed the radiological images as listed and agreed with the findings in the report.  PET/CT 08/10/2019: IMPRESSION: 1. The 2.8 cm right level IIa lymph node has a maximum SUV of 28.4. A primary lesion is not readily seen. There is some asymmetric palatine tonsillar activity with the left tonsil having maximum SUV of 12.4 and the right 7.8, but no tonsillar mass is observed on the CT data. 2. No compelling findings of active malignancy in the chest, abdomen/pelvis, or skeleton. There is some accentuated anal activity which is likely physiologic given the lack of visible abnormality on the CT data. 3. Other imaging findings of potential clinical significance: Diffuse hepatic steatosis. Hypodense left renal lesion is probably a cyst but technically too small to characterize.   Electronically Signed   By: Van Clines M.D.   On: 08/10/2019 15:54   08/21/19 of Surgical Pathology [id number]    SURGICAL PATHOLOGY  * THIS IS AN AMENDED REPORT   CASE: [id number]  PATIENT: [facility]  Surgical Pathology Report   Amendment    Reason for Amendment #1: Other   Clinical History: squamous cell of neck and head (cm)     FINAL MICROSCOPIC DIAGNOSIS:   A. TONSIL, RIGHT:  - Squamous cell carcinoma, basaloid, p16 positive.  - Margins not involved.  - Carcinoma focally 1 mm from surgical margin.  - See oncology table and comment.   B. TONSIL, LEFT:  - Benign tonsil.  - No malignancy identified.   C. TONGUE, RIGHT BASE, BIOPSY:  - Benign tonsillar tissue.  - No malignancy identified.   D. TONGUE, LEFT BASE, BIOPSY:  - Benign tonsillar tissue.  - No malignancy identified.   ONCOLOGY TABLE:  PHARYNX (OROPHARYNX, HYPOPHARYNX, NASOPHARYNX):  Procedure: Right and left  tonsillectomy and right and left tongue base  biopsies.  Tumor Site: Right tonsil.  Tumor Laterality: Right tonsil.  Tumor Focality: Unifocal.  Tumor Size: 1 cm x 0.3 cm (3 mm)  depth.  Histologic Type: Squamous cell carcinoma, basaloid type, p16 positive,  EBV negative.  Histologic Grade: Poorly differentiated.  Margins: Free of tumor. Carcinoma focally 1 mm from the nearest  surgical margin.    Distance to closest Margin: 1 mm.  Lymphovascular Invasion: Present.  Perineural Invasion: Not identified.  Regional Lymph Nodes: No lymph nodes submitted with this case. The  previous right neck lymph node needle core biopsy (MCS [phone]) shows  squamous cell carcinoma.  Pathologic Stage Classification (pTNM, AJCC 8th Edition): pT1, pN1, see  comment.    09/11/19 of Surgical Pathology Report [id number])  COMMENT:   Metastatic carcinoma is present in 3 lymph nodes - 1 level 1 lymph node,  1 level 2 lymph node (this metastatic focus also involves skeletal  muscle), and 1 level 3 lymph node (highlighted by a cytokeratin  immunohistochemical stain). Additional studies can be performed (block  A11) upon clinician request.    ASSESSMENT & PLAN:  Dustin Bostock Sr. is a 54 y.o. male with:  1. Rt Tonsilar Basaloid Squamous cell carcinoma , HPV+ve. PT1,pN1,M0. 3/49 LN+ve High risk features - LVI, extranodal extension, high risk morphology. Significant second hand smoke exposure S/p b/l tonsillectomy on 08/21/2019 S/p rt radical neck dissection   PLAN: -Discussed patient's most recent labs from 09/11/19, of CBC is as follows: all values are WNL except for Glucose at 100, Calcium at 8.7 -Discussed 09/11/19 of Surgical Pathology [phone]) -Discussed 08/21/19 of Surgical Pathology ([id number]) -Discussed 08/10/19 of PET Skull Base to Thigh ([id number]) -Discussed 07/26/19 of [place] Core Biopsy ([id number]) -Discussed 07/13/19 of CT Soft Tissue Neck ([id number]) -Discussed National comprehensive guidelines for oral pharynx -T1N1 staging  -Treatment options surgery, adverse features, extra capsular/extranodal extension, vascular, lymphatic  involvement, systemic therapy with radiation  -Advised on  radiation or concurrent chemo/RT - combining two can become curative but increase radiations toxicity . -recommended combined chemo/RT-- patient agreeable with this option. -Advised on radiation toxicity- loss of salivary function, soar throat, possibility of feeding tube, pain, skin changes  -Advised on HPV positive tumor  -Advised on tumor looking more aggressive with basaloid morphology -Advised on extracapsular extension -advised on local recurrence of tumor  -Advised on staging- size local tumor, structures involvement, number of lymph nodes  -Advised radiation will be based on initial scans  -Advised on sleep apnea  -Advised on cisplatin treatment option -pending audiogram -he is following speech therapy. -Recommended that the pt continue to eat well, drink at least 48-64 oz of water each day, and walk 20-30 minutes each day.  -Recommends healthy diet  -Will have to get a port a cath and PEG-tube -- discussed with patient -F/u with Dr. Isidore Moos  FOLLOW UP Audiogram with Dr Constance Holster IR for port a cath and PEG tube Will setup for chemo-counseling for chemotherapy-- likely weekly Cisplatin concurrent with RT unless significant hearing deficits on Audiogram Would plan to start chemotherapy concurrently with RT  The total time spent in the appt was 80 minutes and more than 50% was on counseling and direct patient cares.  All of the patient's questions were answered with apparent satisfaction. The patient knows to call the clinic with any problems, questions or concerns.   Sullivan Lone MD [place] AAHIVMS [facility] [place] Hematology/Oncology Physician [place]  (Office):  [phone] (Work cell):  [phone] (Fax):           [phone]  09/28/2019 4:04 PM  I, Dawayne Cirri am acting as a Education administrator for Dr. Sullivan Lone.   .I have reviewed the above documentation for accuracy and completeness, and I agree with the  above. Brunetta Genera MD

## 2019-09-28 ENCOUNTER — Other Ambulatory Visit: Payer: Self-pay

## 2019-09-28 ENCOUNTER — Ambulatory Visit: Payer: 59 | Attending: Radiation Oncology

## 2019-09-28 ENCOUNTER — Inpatient Hospital Stay: Payer: 59 | Attending: Hematology | Admitting: Hematology

## 2019-09-28 VITALS — BP 137/79 | HR 76 | Temp 99.9°F | Resp 18 | Ht 68.5 in | Wt 297.0 lb

## 2019-09-28 DIAGNOSIS — R1310 Dysphagia, unspecified: Secondary | ICD-10-CM | POA: Diagnosis present

## 2019-09-28 DIAGNOSIS — C760 Malignant neoplasm of head, face and neck: Secondary | ICD-10-CM

## 2019-09-28 NOTE — Progress Notes (Signed)
Oncology Nurse Navigator Documentation  I met with Mr. Bagot and his wife during his [facility] appointment today. I answered questions to the best of my ability. I escorted him back up to registration to check in for his appointment with Dr. Irene Limbo. They know to call me if they have any further questions or concerns.   Harlow Asa RN, BSN, OCN Head & Neck Oncology Nurse [place] at [facility] Phone # [phone]  Fax # [phone]

## 2019-09-28 NOTE — Patient Instructions (Signed)
SWALLOWING EXERCISES Do these until 6 months after your last day of radiation, then 2-3 times per week afterwards  1. Effortful Swallows - Press your tongue against the roof of your mouth for 3 seconds, then squeeze the muscles in your neck while you swallow your saliva or a sip of water - Repeat 10-15 times, 2-3 times a day, and use whenever you eat or drink  2. Masako Swallow - swallow with your tongue sticking out - Stick tongue out past your teeth and gently bite tongue with your teeth - Swallow, while holding your tongue with your teeth - Repeat 10-15 times, 2-3 times a day *use a wet spoon if your mouth gets dry*  3. Pitch Raise - Repeat "he", once per second in as high of a pitch as you can - Repeat 20 times, 2-3 times a day  4. Shaker Exercise - head lift - Lie flat on your back in your bed or on a couch without pillows - Raise your head and look at your feet - KEEP YOUR SHOULDERS DOWN - HOLD FOR 45-60 SECONDS, then lower your head back down - Repeat 3 times, 2-3 times a day  5. Mendelsohn Maneuver - "half swallow" exercise - Start to swallow, and keep your Adam's apple up by squeezing hard with the muscles of the throat - Hold the squeeze for 5-7 seconds and then relax - Repeat 10-15 times, 2-3 times a day *use a wet spoon if your mouth gets dry*  6. Chin pushback - Open your mouth  - Place your fist UNDER your chin near your neck - Tuck your chin and push back with your fist for 5 seconds - Repeat 10 times, 2-3 times a day       7.    "Super Swallow"  - Take a breath and hold it  - Bear down (like pushing your bowels)  - Swallow then IMMEDIATELY cough  - Repeat 10 times, 2-3 times a day   

## 2019-09-28 NOTE — Therapy (Signed)
[facility] [address] [place], [place], [address] Phone: [phone]   Fax:  [phone]  Speech Language Pathology Evaluation  Patient Details  Name: Dustin Shellman Sr. MRN: [id number] Date of Birth: [birth year] Referring Provider (SLP): Eppie Gibson, MD   Encounter Date: 09/28/2019  End of Session - 09/28/19 1230    Visit Number  1    Number of Visits  7    Date for SLP Re-Evaluation  03/29/20    SLP Start Time  61    SLP Stop Time   1140    SLP Time Calculation (min)  34 min    Activity Tolerance  Patient tolerated treatment well       Past Medical History:  Diagnosis Date  . Arthritis   . Change in stool 02/22/2017  . Gout 02/22/2017  . Hyperlipidemia   . Sleep apnea    Uses CPAP  . Tonsil cancer [facility])    metastatic    Past Surgical History:  Procedure Laterality Date  . CARPAL TUNNEL RELEASE Right 04/10/2019  . DIRECT LARYNGOSCOPY N/A 08/21/2019   Procedure: DIRECT LARYNGOSCOPY - WITH BX;  Surgeon: [name], MD;  Location: [facility];  Service: ENT;  Laterality: N/A;  . NECK DISSECTION  09/11/2019  . RADICAL NECK DISSECTION Right 09/11/2019   Procedure: RADICAL NECK DISSECTION;  Surgeon: [name], MD;  Location: [facility];  Service: ENT;  Laterality: Right;  . TONSILLECTOMY Bilateral 08/21/2019   Procedure: TONSILLECTOMY;  Surgeon: [name], MD;  Location: [facility];  Service: ENT;  Laterality: Bilateral;  . WISDOM TOOTH EXTRACTION      There were no vitals filed for this visit.      SLP Evaluation [facility] - 09/28/19 1159      SLP Visit Information   SLP Received On  09/28/19    Referring Provider (SLP)  Eppie Gibson, MD    Onset Date  Early 2021    Medical Diagnosis  Squamous Cell Carcinoma of Head and Neck/ Rt tonsillar SCCA      Subjective   Patient/Family Stated Goal  "Keep my swallowing right."      General Information   HPI  Presented to Dr.  Erik Obey , who ordered CT scan on 07-13-19 revealing necroticcystic node at rt level 2 station indicating nodal metastatic disease.Biopsy 07-26-19 IDd SCCA P16+, PET 08-10-19 revealed enlarged rt level 2 lymph node, some asymmetric tonsillar activity - 08-21-19 tonsillectomy with SCCA to rt tonsil, and 09-11-19 modified radical neck dissection showed metastatic CA in 3/49 nodes      Balance Screen   Has the patient fallen in the past 6 months  No      Prior Functional Status   Cognitive/Linguistic Baseline  Within functional limits      Oral Motor/Sensory Function   Overall Oral Motor/Sensory Function  Impaired    Labial ROM  Within Functional Limits    Labial Symmetry  Abnormal symmetry right   slight   Labial Strength  Reduced Right    Labial Coordination  Reduced    Lingual ROM  Within Functional Limits    Lingual Symmetry  Within Functional Limits    Lingual Strength  Within Functional Limits      Motor Speech   Overall Motor Speech  Appears within functional limits for tasks assessed       Pt currently tolerates dys III/regular diet with thin liquids. Pt reports rt lateral sulcus residue which he clears with  a finger sweep, due to decr'd ROM from anterior cheek muculature. POs: Pt ate ham sandwich and drank H2O without overt s/s aspiration. Thyroid elevation appeared adequate, and swallows appeared timesly. Oral residue noted as WNL. Pt's swallow deemed WNL/WFL at this time.   Because data states the risk for dysphagia during and after radiation treatment is high due to undergoing radiation tx, SLP taught pt about the possibility of reduced/limited ability for PO intake during rad tx. SLP encouraged pt to continue swallowing POs as far into rad tx as possible, even ingesting POs and/or completing HEP shortly after administration of pain meds. Among other modifications for days when pt cannot functionally swallow, SLP talked about performing only non-swallowing tasks on the handout/HEP, and  then adding swallowing tasks back in when it becomes possible to do so.  SLP educated pt re: changes to swallowing musculature after rad tx, and why adherence to dysphagia HEP provided today and PO consumption was necessary to inhibit muscular disuse atrophy and to reduce muscle fibrosis following rad tx. Pt demonstrated understanding of these things to SLP.    SLP then developed a HEP for pt and pt was instructed how to perform exercises involving lingual, vocal, and pharyngeal strengthening. SLP performed each exercise and pt return demonstrated each exercise. SLP ensured pt performance was correct prior to moving on to next exercise. Pt was instructed to complete this program 2-3 times a day, 6-7 days/week until 6 months after his or her last rad tx, then x2-3 a week after that.                SLP Education - 09/28/19 1229    Education Details  late effect head/neck radiation on swallowing function, HEP procedure    Person(s) Educated  Patient;Spouse    Methods  Explanation;Demonstration;Verbal cues    Comprehension  Verbalized understanding;Returned demonstration;Verbal cues required;Need further instruction       SLP Short Term Goals - 09/28/19 1232      SLP SHORT TERM GOAL #1   Title  Pt will demo knowledge of rationale for HEP completion    Time  2    Period  --   sessions, for all STGs   Status  New      SLP SHORT TERM GOAL #2   Title  Pt will complete HEP with rare min A over two sessions    Time  2    Status  New      SLP SHORT TERM GOAL #3   Title  Pt will tell SLP how a food journal can hasten/facilitate return to more normalized diet    Time  3    Status  New      SLP SHORT TERM GOAL #4   Title  Pt will tell SLP 3 overt s/s aspiration PNA with modified independence    Time  3       SLP Long Term Goals - 09/28/19 1234      SLP LONG TERM GOAL #1   Title  Pt will complete HEP with modified independence over 3 visits    Time  5    Period  --    sessions, for all LTGs   Status  New      SLP LONG TERM GOAL #2   Title  Pt will tell SLP when to decr frequency of HEP    Time  6    Status  New       Plan - 09/28/19 1231  Clinical Impression Statement  Pt presents today with WNL/WFL swallowing ability with ham sandwich and water.  No overt s/sx aspiration PNA reported or observed today. Data suggests that as pts progress through rad or chemorad therapy that their swallowing ability will decrease. Also, WNL swallowing is threatened by muscle fibrosis that will likely develop after rad/chemorad is completed. Therefore, SLP developed an indivdualized exercise program to mitigate/eliminate pt's muscle fibrosis following and as a result of, pt's rad tx. Skilled ST would be beneficial to the pt in order to regularly assess pt's safety with POs and/or need for instrumental swallow assessment, as well as to assess accurate completion of swallowing HEP.    Speech Therapy Frequency  --   once approx every 4 weeks   Duration  --   7 total sessions   Treatment/Interventions  Aspiration precaution training;Pharyngeal strengthening exercises;Diet toleration management by SLP;Trials of upgraded texture/liquids;Patient/family education;SLP instruction and feedback    Potential to Achieve Goals  Good       Patient will benefit from skilled therapeutic intervention in order to improve the following deficits and impairments:   Dysphagia, unspecified type    Problem List Patient Active Problem List   Diagnosis Date Noted  . Tonsil cancer ([facility]) 09/11/2019  . Squamous cell carcinoma of right tonsil ([facility]) 09/04/2019  . S/P tonsillectomy 08/21/2019  . Squamous cell carcinoma of head and neck ([facility]) 08/10/2019  . Morbid obesity ([facility]) 06/08/2019  . Moderate mixed hyperlipidemia not requiring statin therapy 06/08/2019  . OSA (obstructive sleep apnea) 06/08/2019  . Change in stool 02/22/2017  . Gout 02/22/2017    [place] ,[place],  [place]  09/28/2019, 12:35 PM  [facility] [address] [place], [place], [address] Phone: [phone]   Fax:  [phone]  Name: Dustin Robberson Sr. MRN: [id number] Date of Birth: June 29, 1965

## 2019-09-28 NOTE — Patient Instructions (Signed)
Thank you for choosing [facility] to provide your oncology and hematology care.   Should you have questions after your visit to the [facility] ([facility]), please contact this office at [phone] between 8:30 AM and 4:30 PM.  Voice mails left after 4:00 PM may not be returned until the following business day.  Calls received after 4:30 PM will be answered by an off-site Nurse Triage Line.    Prescription Refills:  Please have your pharmacy contact us directly for most prescription requests.  Contact the office directly for refills of narcotics (pain medications). Allow 48-72 hours for refills.  Appointments: Please contact the [facility] scheduling department [phone] for questions regarding [facility] appointment scheduling.  Contact the schedulers with any scheduling changes so that your appointment can be rescheduled in a timely manner.   Central Scheduling for [facility] [phone] - Call to schedule procedures such as PET scans, CT scans, MRI, Ultrasound, etc.  To afford each patient quality time with our providers, please arrive 30 minutes before your scheduled appointment time.  If you arrive late for your appointment, you may be asked to reschedule.  We strive to give you quality time with our providers, and arriving late affects you and other patients whose appointments are after yours. If you are a no show for multiple scheduled visits, you may be dismissed from the clinic at the providers discretion.     Resources: [facility] Social Workers [phone] for additional information on assistance programs or assistance connecting with community support programs   [place] DSS  336-641-3447: Information regarding food stamps, Medicaid, and utility assistance [place]AT 336-333-6589   [place] [employer]'s shared-ride transportation service for eligible riders who have a disability that prevents them from riding the fixed route bus.   Medicare Rights Center  800-333-4114 Helps people with Medicare understand their rights and benefits, navigate the Medicare system, and secure the quality healthcare they deserve [employer] 800-227-2345 Assists patients locate various types of support and financial assistance Cancer Care: 1-800-813-HOPE (4673) Provides financial assistance, online support groups, medication/co-pay assistance.   Transportation Assistance for appointments at [facility]: Transportation Coordinator [phone]  Again, thank you for choosing [facility] for your care.       

## 2019-09-29 ENCOUNTER — Telehealth: Payer: Self-pay | Admitting: Hematology

## 2019-09-29 NOTE — Telephone Encounter (Signed)
Sent medical records to [id number] at fax: [phone] Release [id number]

## 2019-10-05 ENCOUNTER — Telehealth (HOSPITAL_COMMUNITY): Payer: Self-pay

## 2019-10-05 NOTE — Telephone Encounter (Signed)
-----   Message from Aletta Edouard, MD sent at 10/05/2019 10:22 AM EDT ----- Regarding: RE: Peg/Port Placement OK for same day port followed by g-tube in IR w/ sedation. Needs barium night before for g-tube.  GY  ----- Message ----- From: Danielle Dess Sent: 10/05/2019   9:23 AM EDT To: Ir Procedure Requests Subject: Peg/Port Placement                             Procedure: Peg and Port Placement  Dx: Squamous cell carcinoma of head and neck  Ordering[facility] [phone])  Imaging: NM pet done 08/10/19 in Epic  Please review.   Thanks, Lia Foyer

## 2019-10-05 NOTE — Progress Notes (Signed)
Oncology Nurse Navigator Documentation  I spoke with Dustin Rios via phone today to discuss appointments that have been scheduled for next week. He is aware that he will be at [facility] for his appointment with Dr. Isidore Moos and simulation on 10/10/19 and receive PEG education from me at that appointment as well. He will then have a COVID test at 1:50 and Audiology exam at 2:20. He is then scheduled for his PAC and PEG placement on 6/4 with arrival time of 7:00 at [facility]. He voiced his understanding of these appointments and knows to call me if he has any further questions or concerns.   Harlow Asa RN, BSN, OCN Head & Neck Oncology Nurse [place] at [facility] Phone # [phone]  Fax # [phone]

## 2019-10-06 NOTE — Progress Notes (Signed)
Head and Neck Cancer Location of Tumor / Histology:  Squamous Cell Carcinoma of the Head and Neck  Biopsies revealed:  09/11/2019 FINAL MICROSCOPIC DIAGNOSIS:  A. LYMPH NODE, MODIFIED RADICAL NECK I-IV, REGIONAL DISSECTION:  - Metastatic carcinoma in 3 of 49 lymph nodes (3/49), with extracapsular  extension.  - See comment.  COMMENT:  Metastatic carcinoma is present in 3 lymph nodes - 1 level 1 lymph node,  1 level 2 lymph node (this metastatic focus also involves skeletal  muscle), and 1 level 3 lymph node (highlighted by a cytokeratin  immunohistochemical stain). 08/21/2019 FINAL MICROSCOPIC DIAGNOSIS:  A. TONSIL, RIGHT:  - Squamous cell carcinoma, basaloid, p16 positive.  - Margins not involved.  - Carcinoma focally 1 mm from surgical margin.  - See oncology table and comment.  B. TONSIL, LEFT:  - Benign tonsil.  - No malignancy identified.  C. TONGUE, RIGHT BASE, BIOPSY:  - Benign tonsillar tissue.  - No malignancy identified.  D. TONGUE, LEFT BASE, BIOPSY:  - Benign tonsillar tissue.  - No malignancy identified.  Nutrition Status Yes No Comments  Weight changes? [x]  []  Immediately after tonsillectomy lost about 27lbs, but he's slowly regained weight back  Swallowing concerns? [x]  []  Ocacsionally feels like something is stuck in his throat, and he gets choked on what he's swallowing  PEG? [x]  []     Referrals Yes No Comments  Social Work? []  [x]    Dentistry? [x]  []  Fitted for fluoride trays and scatter protection device on 09/04/2019 by Dr. Teena Dunk  Swallowing therapy? [x]  []  Saw Glendell Docker Schinke-SLP on 09/28/2019  Nutrition? [x]  []    Med/Onc? [x]  []  Dr. Sullivan Lone   Safety Issues Yes No Comments  Prior radiation? []  [x]    Pacemaker/ICD? []  [x]    Possible current pregnancy? []  [x]    Is the patient on methotrexate? []  [x]     Tobacco/Marijuana/Snuff/ETOH use: He has never smoked. He does not drink alcohol.   Past/Anticipated interventions by otolaryngology,  if any:  08/21/2018 Dr. [name] TONSILLECTOMY DIRECT LARYNGOSCOPY - WITH BX 09/11/2019 Dr. [name] Modified RADICAL NECK DISSECTION  Past/Anticipated interventions by medical oncology, if any:  Under care of Dr. Sullivan Lone  09/28/2019 FOLLOW UP Audiogram with Dr Constance Holster IR for port a cath and PEG tube Will setup for chemo-counseling for chemotherapy-- likely weekly Cisplatin concurrent with RT unless significant hearing deficits on Audiogram Would plan to start chemotherapy concurrently with RT  Current Complaints / other details:  Patient reports that he continues to experience some numbness and tingling along his right ear and jawline. He also reports some discomfort in his salivary gland when he eats anything acidic--he states it feels like a burning/tingling sensation for the first 6 seconds and then it dissipates. He also reports some postnasal drainage that he feels collects in his throat, and causes him to constantly have to clear his throat.

## 2019-10-10 ENCOUNTER — Ambulatory Visit
Admission: RE | Admit: 2019-10-10 | Discharge: 2019-10-10 | Disposition: A | Discharge status: Home / Self Care | Payer: 59 | Source: Ambulatory Visit | Attending: Radiation Oncology | Admitting: Radiation Oncology

## 2019-10-10 ENCOUNTER — Telehealth: Payer: Self-pay | Admitting: Radiation Oncology

## 2019-10-10 ENCOUNTER — Other Ambulatory Visit: Payer: Self-pay

## 2019-10-10 ENCOUNTER — Other Ambulatory Visit (HOSPITAL_COMMUNITY)
Admission: RE | Admit: 2019-10-10 | Discharge: 2019-10-10 | Disposition: A | Discharge status: Home / Self Care | Payer: 59 | Source: Ambulatory Visit | Attending: Hematology | Admitting: Hematology

## 2019-10-10 ENCOUNTER — Telehealth: Payer: Self-pay

## 2019-10-10 ENCOUNTER — Encounter: Payer: Self-pay | Admitting: Radiation Oncology

## 2019-10-10 VITALS — BP 142/86 | HR 99 | Temp 98.2°F | Resp 20 | Ht 68.0 in | Wt 298.8 lb

## 2019-10-10 DIAGNOSIS — C090 Malignant neoplasm of tonsillar fossa: Secondary | ICD-10-CM

## 2019-10-10 DIAGNOSIS — C760 Malignant neoplasm of head, face and neck: Secondary | ICD-10-CM | POA: Diagnosis not present

## 2019-10-10 DIAGNOSIS — Z20822 Contact with and (suspected) exposure to covid-19: Secondary | ICD-10-CM | POA: Diagnosis not present

## 2019-10-10 DIAGNOSIS — Z01812 Encounter for preprocedural laboratory examination: Secondary | ICD-10-CM | POA: Insufficient documentation

## 2019-10-10 DIAGNOSIS — C099 Malignant neoplasm of tonsil, unspecified: Secondary | ICD-10-CM

## 2019-10-10 DIAGNOSIS — Z510 Encounter for antineoplastic radiation therapy: Secondary | ICD-10-CM | POA: Diagnosis present

## 2019-10-10 LAB — SARS CORONAVIRUS 2 (TAT 6-24 HRS): SARS Coronavirus 2: NEGATIVE

## 2019-10-10 NOTE — Progress Notes (Signed)
Oncology Nurse Navigator Documentation  Met with Dustin Rios and his wife Dustin Rios to provide PEG/port education prior to 10/13/19 placement.  Provided port educational handout, showed example, provided guidance for post-surgical dsg removal, site care.  . Using  PEG teaching device   and Teach Back, provided education for PEG use and care, including: hand hygiene, gravity bolus administration of daily water flushes and nutritional supplement, fluids and medications; care of tube insertion site including daily dressing change and cleaning; S&S of infection.   . Dustin Rios and his wife Dustin Rios correctly verbalized procedures for and provided correct return demonstration of gravity administration of water, dressing change and site care.  . I provided written instructions for PEG flushing/dressing change in support of verbal instruction.   . I provided/described contents of Start of Care Bolus Feeding Kit (3 60 cc syringes, 2 boxes 4x4 drainage sponges, 1 package mesh briefs, 1 roll paper tape, 1 case Osmolite 1.5).  He voiced understanding he is to start using Osmolite per guidance of Nutrition. Marland Kitchen He understands I will be available for ongoing PEG support. Provided barium sulfate prep which I obtained from [facility] IR, reviewed instructions which included guidance for today's COVID screening at [facility].   To provide support, encouragement and care continuity, I also met with Dustin Rios and his wife during his CT SIM.  He tolerated procedure without difficulty, denied questions/concerns.   I toured him to the radiation treatment area, explained procedures for lobby registration, arrival to Radiation Waiting, arrival to tmt area and preparation for tmt.  He voiced understanding.   I encouraged him to call me prior to the start of radiation.   Dustin Asa RN, BSN, OCN Head & Neck Oncology Nurse [place] at [facility] Phone # [phone]  Fax #  [phone]

## 2019-10-10 NOTE — Telephone Encounter (Signed)
Scheduled appt per 6/1 sch message - unable to reach pt mailed letter with appt date and time

## 2019-10-10 NOTE — Progress Notes (Signed)
Radiation Oncology         [phone]) [phone] ________________________________  Name: Dustin Leiter Sr. MRN: [id number]  Date: 10/10/2019  DOB: 1965/08/08  Follow-Up Visit Note  CC: Claretta Fraise, MD  [name], MD  Diagnosis and Prior Radiotherapy:    C09.0   ICD-10-CM   1. Squamous cell carcinoma of head and neck (HCC)  C76.0   2. Primary cancer of tonsillar fossa ([place])  C09.0    Cancer Staging Malignant neoplasm of tonsillar fossa ([place]) Staging form: Pharynx - HPV-Mediated Oropharynx, AJCC 8th Edition - Pathologic stage from 10/10/2019: Stage I (pT1, pN1, cM0, p16+) - Signed by Eppie Gibson, MD on 10/11/2019  Squamous cell carcinoma of head and neck ([place]) Staging form: Pharynx - HPV-Mediated Oropharynx, AJCC 8th Edition - Clinical stage from 08/11/2019: Stage I (cT0, cN1, cM0, p16+) - Signed by Eppie Gibson, MD on 08/12/2019   CHIEF COMPLAINT: tonsil cancer  Narrative:  The patient returns today for routine follow-up.  On 08/21/2019 he underwent targeted biopsies of the throat.  This revealed a primary lesion in the right tonsil.  Histology consistent with squamous cell carcinoma, basaloid, p16 positive.  Margins negative.  Carcinoma was focally 1 mm from the surgical margin.  Carcinoma measured 1 cm in greatest dimension.  It was poorly differentiated.  EBV negative.  Other biopsies in the throat were negative.       He proceeded with right neck dissection on 09/11/2019 which revealed 3/49 lymph nodes positive.  1 was at level 1, 1 at level 2, and 1 at level 3.  The level 2 node involves skeletal muscle.  Extracapsular extension was revealed.  I have spoken with Dr. Constance Holster.  He is cleared for radiation planning.  The patient has been referred to multidisciplinary clinic.  Anticipating concurrent chemo therapy.  He has some postoperative numbness and tingling around his right ear and jawline.  He has some discomfort in his salivary gland when he eats anything acidic.  He has postnasal  drainage that he must clear from his throat       ALLERGIES:  has No Known Allergies.  Meds: Current Outpatient Medications  Medication Sig Dispense Refill  . loratadine (EQ LORATADINE) 10 MG tablet Take 10 mg by mouth daily.     . Colchicine (MITIGARE) 0.6 MG CAPS Take 2 capsules at onset of flare. Then take 1 capsule 1 hour later.  May take 1 capsule up to twice daily thereafter. (Patient not taking: Reported on 09/28/2019) 60 capsule 2  . HYDROcodone-acetaminophen (HYCET) 7.5-325 mg/15 ml solution Take 15 mLs by mouth 4 (four) times daily as needed for moderate pain. (Patient not taking: Reported on 09/28/2019) 420 mL 0  . promethazine (PHENERGAN) 25 MG suppository Place 1 suppository (25 mg total) rectally every 6 (six) hours as needed for nausea or vomiting. (Patient not taking: Reported on 09/28/2019) 12 suppository 1  . sodium fluoride (PREVIDENT 5000 PLUS) 1.1 % CREA dental cream Apply to tooth brush or in fluoride trays as directed.  Repeat nightly. Spit out excess-DO NOT swallow. DO NOT rinse afterwards. (Patient not taking: Reported on 09/28/2019) 2 Tube prn  . traZODone (DESYREL) 50 MG tablet Take 1 tablet (50 mg total) by mouth at bedtime as needed for sleep. (Patient not taking: Reported on 10/05/2019) 30 tablet 3   No current facility-administered medications for this encounter.    Physical Findings: The patient is in no acute distress. Patient is alert and oriented. Wt Readings from Last 3 Encounters:  10/10/19 298 lb 12.8 oz (135.5 kg)  09/28/19 297 lb (134.7 kg)  09/11/19 280 lb (127 kg)    height is 5\' 8"  (1.727 m) and weight is 298 lb 12.8 oz (135.5 kg). His temperature is 98.2 F (36.8 C). His blood pressure is 142/86 (abnormal) and his pulse is 99. His respiration is 20 and oxygen saturation is 97%. .  General: Alert and oriented, in no acute distress HEENT: Head is normocephalic.  Oropharynx is notable for no lesions or thrush Neck: Neck is notable for well approximated  surgical scar, right neck.  No palpable masses Psychiatric: Judgment and insight are intact. Affect is appropriate.   Lab Findings: Lab Results  Component Value Date   WBC 6.4 09/11/2019   HGB 14.4 09/11/2019   HCT 45.0 09/11/2019   MCV 92.6 09/11/2019   PLT 202 09/11/2019    Lab Results  Component Value Date   TSH 2.930 04/03/2019    Radiographic Findings: No results found.  Impression/Plan:    We again reviewed the logistics, side effects, risks, and benefits of radiotherapy.  Given his extracapsular extension, he needs concurrent chemoradiotherapy adjuvantly for the best chance of cure.  He understands the radiotherapy will take place 5 days a week over 6 weeks.  CT simulation is scheduled for today.  Radiotherapy side effects may include but not necessarily be limited to skin irritation, hair loss, lymphedema of the neck, taste changes, mucositis, pain, weight loss, malnutrition, nausea, dental issues, dysphagia, fatigue, hypothyroidism, internal organ injury, injury to soft tissues of the head and neck region.  No guarantees of treatment were provided.  He is enthusiastic about proceeding with treatment.  Consent form has been signed and placed in his chart.  I look forward to participating in his care.    On date of service, in total, I spent 25 minutes on this encounter.  He was seen in person.  _____________________________________   Eppie Gibson, MD

## 2019-10-10 NOTE — Telephone Encounter (Signed)
Faxed MD completed and signed work status form with supporting office notes from 09/28/19 to patient's employer.

## 2019-10-11 ENCOUNTER — Ambulatory Visit (HOSPITAL_COMMUNITY): Payer: 59

## 2019-10-11 ENCOUNTER — Ambulatory Visit (HOSPITAL_COMMUNITY): Admission: RE | Admit: 2019-10-11 | Payer: 59 | Source: Ambulatory Visit

## 2019-10-11 ENCOUNTER — Encounter: Payer: Self-pay | Admitting: Radiation Oncology

## 2019-10-11 DIAGNOSIS — C090 Malignant neoplasm of tonsillar fossa: Secondary | ICD-10-CM | POA: Insufficient documentation

## 2019-10-12 ENCOUNTER — Other Ambulatory Visit: Payer: Self-pay | Admitting: Radiology

## 2019-10-12 ENCOUNTER — Other Ambulatory Visit: Payer: Self-pay | Admitting: Hematology

## 2019-10-12 DIAGNOSIS — Z7189 Other specified counseling: Secondary | ICD-10-CM

## 2019-10-12 MED ORDER — SODIUM CHLORIDE 0.9 % IV SOLN
INTRAVENOUS | Status: DC
Start: 2019-10-13 — End: 2023-03-23

## 2019-10-12 MED ORDER — DEXTROSE 5 % IV SOLN
2.0000 g | INTRAVENOUS | Status: AC
Start: 2019-10-13 — End: 2019-10-14

## 2019-10-12 NOTE — Progress Notes (Signed)
START ON PATHWAY REGIMEN - Head and Neck     A cycle is every 7 days:     Cisplatin   **Always confirm dose/schedule in your pharmacy ordering system**  Patient Characteristics: Oral Cavity, Stage III, IVA; Resectable, Surgery Followed by Radiation/Postoperative Therapy, ENE or Positive Margins Disease Classification: Oral Cavity AJCC T Category: T1 Current Disease Status: No Distant Metastases and No Recurrent Disease AJCC M Category: M0 AJCC N Category: pN1 AJCC 8 Stage Grouping: III Intent of Therapy: Curative Intent, Discussed with Patient

## 2019-10-13 ENCOUNTER — Encounter (HOSPITAL_COMMUNITY): Payer: Self-pay

## 2019-10-13 ENCOUNTER — Ambulatory Visit (HOSPITAL_COMMUNITY)
Admission: RE | Admit: 2019-10-13 | Discharge: 2019-10-13 | Disposition: A | Discharge status: Home / Self Care | Payer: 59 | Source: Ambulatory Visit | Attending: Hematology | Admitting: Hematology

## 2019-10-13 ENCOUNTER — Other Ambulatory Visit: Payer: Self-pay

## 2019-10-13 DIAGNOSIS — C760 Malignant neoplasm of head, face and neck: Secondary | ICD-10-CM

## 2019-10-13 DIAGNOSIS — G4730 Sleep apnea, unspecified: Secondary | ICD-10-CM | POA: Insufficient documentation

## 2019-10-13 DIAGNOSIS — E785 Hyperlipidemia, unspecified: Secondary | ICD-10-CM | POA: Insufficient documentation

## 2019-10-13 DIAGNOSIS — M1990 Unspecified osteoarthritis, unspecified site: Secondary | ICD-10-CM | POA: Insufficient documentation

## 2019-10-13 DIAGNOSIS — M109 Gout, unspecified: Secondary | ICD-10-CM | POA: Insufficient documentation

## 2019-10-13 HISTORY — PX: IR IMAGING GUIDED PORT INSERTION: IMG5740

## 2019-10-13 HISTORY — PX: IR GASTROSTOMY TUBE MOD SED: IMG625

## 2019-10-13 LAB — BASIC METABOLIC PANEL
Anion gap: 10 (ref 5–15)
BUN: 13 mg/dL (ref 6–20)
CO2: 26 mmol/L (ref 22–32)
Calcium: 8.8 mg/dL — ABNORMAL LOW (ref 8.9–10.3)
Chloride: 102 mmol/L (ref 98–111)
Creatinine, Ser: 1.21 mg/dL (ref 0.61–1.24)
GFR calc Af Amer: >60 mL/min (ref >60)
GFR calc non Af Amer: >60 mL/min (ref >60)
Glucose, Bld: 108 mg/dL — ABNORMAL HIGH (ref 70–99)
Potassium: 4.1 mmol/L (ref 3.5–5.1)
Sodium: 138 mmol/L (ref 135–145)

## 2019-10-13 LAB — CBC
HCT: 44.4 % (ref 39.0–52.0)
Hemoglobin: 14.7 g/dL (ref 13.0–17.0)
MCH: 29.9 pg (ref 26.0–34.0)
MCHC: 33.1 g/dL (ref 30.0–36.0)
MCV: 90.4 fL (ref 80.0–100.0)
Platelets: 221 10*3/uL (ref 150–400)
RBC: 4.91 MIL/uL (ref 4.22–5.81)
RDW: 13.7 % (ref 11.5–15.5)
WBC: 6.1 10*3/uL (ref 4.0–10.5)
nRBC: 0 % (ref 0.0–0.2)

## 2019-10-13 LAB — PROTIME-INR
INR: 1 (ref 0.8–1.2)
Prothrombin Time: 12.3 seconds (ref 11.4–15.2)

## 2019-10-13 MED ORDER — GLUCAGON HCL RDNA (DIAGNOSTIC) 1 MG IJ SOLR
INTRAMUSCULAR | Status: AC
  Filled 2019-10-13: qty 1

## 2019-10-13 MED ORDER — ONDANSETRON HCL 4 MG/2ML IJ SOLN: 4.0000 mg | INTRAMUSCULAR | Status: DC | PRN

## 2019-10-13 MED ORDER — MIDAZOLAM HCL 2 MG/2ML IJ SOLN
INTRAMUSCULAR | Status: AC
  Filled 2019-10-13: qty 2

## 2019-10-13 MED ORDER — HYDROMORPHONE HCL 1 MG/ML IJ SOLN: 1.0000 mg | INTRAMUSCULAR | Status: DC | PRN

## 2019-10-13 MED ORDER — LIDOCAINE-EPINEPHRINE 1 %-1:100000 IJ SOLN
INTRAMUSCULAR | Status: AC
  Filled 2019-10-13: qty 1

## 2019-10-13 MED ORDER — CEFAZOLIN SODIUM-DEXTROSE 2-4 GM/100ML-% IV SOLN
INTRAVENOUS | Status: AC
  Filled 2019-10-13: qty 100

## 2019-10-13 MED ORDER — MIDAZOLAM HCL 2 MG/2ML IJ SOLN
INTRAMUSCULAR | Status: AC | PRN
  Administered 2019-10-13: 1 mg via INTRAVENOUS
  Administered 2019-10-13 (×2): 0.5 mg via INTRAVENOUS
  Administered 2019-10-13 (×2): 1 mg via INTRAVENOUS

## 2019-10-13 MED ORDER — FENTANYL CITRATE (PF) 100 MCG/2ML IJ SOLN
INTRAMUSCULAR | Status: AC | PRN
  Administered 2019-10-13: 50 ug via INTRAVENOUS
  Administered 2019-10-13: 25 ug via INTRAVENOUS
  Administered 2019-10-13: 50 ug via INTRAVENOUS
  Administered 2019-10-13: 25 ug via INTRAVENOUS

## 2019-10-13 MED ORDER — CEFAZOLIN SODIUM-DEXTROSE 2-4 GM/100ML-% IV SOLN
2.0000 g | INTRAVENOUS | Status: AC
  Administered 2019-10-13: 2 g via INTRAVENOUS

## 2019-10-13 MED ORDER — HEPARIN SOD (PORK) LOCK FLUSH 100 UNIT/ML IV SOLN
INTRAVENOUS | Status: AC
  Filled 2019-10-13: qty 5

## 2019-10-13 MED ORDER — LIDOCAINE HCL 1 % IJ SOLN
INTRAMUSCULAR | Status: AC | PRN
  Administered 2019-10-13: 10 mL

## 2019-10-13 MED ORDER — FENTANYL CITRATE (PF) 100 MCG/2ML IJ SOLN
INTRAMUSCULAR | Status: AC
  Filled 2019-10-13: qty 2

## 2019-10-13 MED ORDER — HYDROCODONE-ACETAMINOPHEN 5-325 MG PO TABS: 1.0000 | ORAL_TABLET | ORAL | Status: DC | PRN

## 2019-10-13 MED ORDER — LIDOCAINE-EPINEPHRINE 1 %-1:100000 IJ SOLN
INTRAMUSCULAR | Status: DC | PRN
  Administered 2019-10-13: 10 mL
  Administered 2019-10-13: 20 mL

## 2019-10-13 MED ORDER — LIDOCAINE HCL 1 % IJ SOLN
INTRAMUSCULAR | Status: AC
  Filled 2019-10-13: qty 20

## 2019-10-13 MED ORDER — GLUCAGON HCL (RDNA) 1 MG IJ SOLR
INTRAMUSCULAR | Status: AC | PRN
  Administered 2019-10-13: .5 mg via INTRAVENOUS

## 2019-10-13 MED ORDER — SODIUM CHLORIDE 0.9 % IV SOLN: INTRAVENOUS | Status: DC

## 2019-10-13 NOTE — Sedation Documentation (Signed)
Port a cath  Start time [phone] End Time 1008 Will transition patient from Port-a-Cath to G-Tube on the same table

## 2019-10-13 NOTE — Discharge Instructions (Addendum)
Implanted Port Insertion, Care After This sheet gives you information about how to care for yourself after your procedure. Your health care provider may also give you more specific instructions. If you have problems or questions, contact your health care provider. What can I expect after the procedure? After the procedure, it is common to have:  Discomfort at the port insertion site.  Bruising on the skin over the port. This should improve over 3-4 days. Follow these instructions at home: Port care  After your port is placed, you will get a manufacturer's information card. The card has information about your port. Keep this card with you at all times.  Take care of the port as told by your health care provider. Ask your health care provider if you or a family member can get training for taking care of the port at home. A home health care nurse may also take care of the port.  Make sure to remember what type of port you have. Incision care      Follow instructions from your health care provider about how to take care of your port insertion site. Make sure you: ? Wash your hands with soap and water before and after you change your bandage (dressing). If soap and water are not available, use hand sanitizer. ? Change your dressing as told by your health care provider. ? Leave stitches (sutures), skin glue, or adhesive strips in place. These skin closures may need to stay in place for 2 weeks or longer. If adhesive strip edges start to loosen and curl up, you may trim the loose edges. Do not remove adhesive strips completely unless your health care provider tells you to do that.  Check your port insertion site every day for signs of infection. Check for: ? Redness, swelling, or pain. ? Fluid or blood. ? Warmth. ? Pus or a bad smell. Activity  Return to your normal activities as told by your health care provider. Ask your health care provider what activities are safe for you.  Do not  lift anything that is heavier than 10 lb (4.5 kg), or the limit that you are told, until your health care provider says that it is safe. General instructions  Take over-the-counter and prescription medicines only as told by your health care provider.  Do not take baths, swim, or use a hot tub until your health care provider approves. Ask your health care provider if you may take showers. You may only be allowed to take sponge baths.  Do not drive for 24 hours if you were given a sedative during your procedure.  Wear a medical alert bracelet in case of an emergency. This will tell any health care providers that you have a port.  Keep all follow-up visits as told by your health care provider. This is important. Contact a health care provider if:  You cannot flush your port with saline as directed, or you cannot draw blood from the port.  You have a fever or chills.  You have redness, swelling, or pain around your port insertion site.  You have fluid or blood coming from your port insertion site.  Your port insertion site feels warm to the touch.  You have pus or a bad smell coming from the port insertion site. Get help right away if:  You have chest pain or shortness of breath.  You have bleeding from your port that you cannot control. Summary  Take care of the port as told by your health   care provider. Keep the manufacturer's information card with you at all times.  Change your dressing as told by your health care provider.  Contact a health care provider if you have a fever or chills or if you have redness, swelling, or pain around your port insertion site.  Keep all follow-up visits as told by your health care provider. This information is not intended to replace advice given to you by your health care provider. Make sure you discuss any questions you have with your health care provider. Document Revised: 11/23/2017 Document Reviewed: 11/23/2017 Elsevier Patient Education   [place]. Gastrostomy Tube Home Guide, Adult A gastrostomy tube, or G-tube, is a tube that is inserted through the abdomen into the stomach. The tube is used to give feedings and medicines when a person is unable to eat and drink enough on his or her own. How to care for a G-tube Supplies needed  Saline solution or clean, warm water and soap.  Cotton swab or gauze.  Precut gauze bandage (dressing) and tape, if needed. Instructions 1. Wash your hands with soap and water. 2. If there is a dressing between the person's skin and the tube, remove it. 3. Check the area where the tube enters the skin. Check for problems such as: ? Redness. ? Swelling. ? Pus-like drainage. ? Extra skin growth. 4. Moisten the cotton swab with the saline solution or soap and water mixture. Gently clean around the insertion site. Remove any drainage or crusting. ? When the G-tube is first put in, a normal saline solution or water can be used to clean the skin. ? Mild soap and warm water can be used when the skin around the G-tube site has healed. 5. If there should be a dressing between the person's skin and the tube, apply it at this time. How to flush a G-tube Flush the G-tube regularly to keep it from clogging. Flush it before and after feedings and as often as told by the health care provider. Supplies needed  Purified or sterile water, warmed. If the person has a weak disease-fighting (immune) system, or if he or she has difficulty fighting off infections (is immunocompromised), use only sterile water. ? If you are unsure about the amount of chemical contaminants in purified or drinking water, use sterile water. ? To purify drinking water by boiling:  Boil water for at least 1 minute. Keep lid over water while it boils. Allow water to cool to room temperature before using.  60cc G-tube syringe. Instructions 1. Wash your hands with soap and water. 2. Draw up 30 mL of warm water in a  syringe. 3. Connect the syringe to the tube. 4. Slowly and gently push the water into the tube. G-tube problems and solutions  If the tube comes out: ? Cover the opening with a clean dressing and tape. ? Call a health care provider right away. ? A health care provider will need to put the tube back in within 4 hours.  If there is skin or scar tissue growing where the tube enters the skin: ? Keep the area clean and dry. ? Secure the tube with tape so that the tube does not move around too much. ? Call a health care provider.  If the tube gets clogged: ? Slowly push warm water into the tube with a large syringe. ? Do not force the fluid into the tube or push an object into the tube. ? If you are not able to unclog the  tube, call a health care provider right away. Follow these instructions at home: Feedings  Give feedings at room temperature.  Cover and place unused feedings in the refrigerator.  If feedings are continuous: ? Do not put more than 4 hours worth of feedings in the feeding bag. ? Stop the feedings when you need to give medicine or flush the tube. Be sure to restart the feedings. ? Make sure the person's head is above his or her stomach (upright position). This will prevent choking and discomfort.  Replace feeding bags and syringes as told by the health care provider.  Make sure the person is in the right position during and after feedings: ? During feedings, the person's position should be in the upright position. ? After a noncontinuous feeding (bolus feeding), have the person stay in the upright position for 1 hour. General instructions  Only use syringes made for G-tubes.  Do not pull or put tension on the tube.  Clamp the tube before removing the cap or disconnecting a syringe.  Measure the length of the G-tube every day from the insertion site to the end of the tube.  If the person's G-tube has a balloon, check the fluid in the balloon every week. The  amount of fluid that should be in the balloon can be found in the manufacturer's specifications.  Make sure the person takes care of his or her oral health, such as by brushing his or her teeth.  Remove excess air from the G-tube as told by the person's health care provider. This is called "venting."  Keep the area where the tube enters the skin clean and dry.  Do not push feedings, medicines, or flushes rapidly. Contact a health care provider if:  The person with the tube has any of these problems: ? Constipation. ? Fever.  There is a large amount of fluid or mucus-like liquid leaking from the tube.  Skin or scar tissue appears to be growing where the tube enters the skin.  The length of tube from the insertion site to the G-tube gets longer. Get help right away if:  The person with the tube has any of these problems: ? Severe abdominal pain. ? Severe tenderness. ? Severe bloating. ? Nausea. ? Vomiting. ? Trouble breathing. ? Shortness of breath.  Any of these problems happen in the area where the tube enters the skin: ? Redness, irritation, swelling, or soreness. ? Pus-like discharge. ? A bad smell.  The tube is clogged and cannot be flushed.  The tube comes out. Summary  A gastrostomy tube, or G-tube, is a tube that is inserted through the abdomen into the stomach. The tube is used to give feedings and medicines when a person is unable to eat and drink enough on his or her own.  Check and clean the insertion site daily as told by the person's health care provider.  Flush the G-tube regularly to keep it from clogging. Flush it before and after feedings and as often as told by the person's health care provider.  Keep the area where the tube enters the skin clean and dry. This information is not intended to replace advice given to you by your health care provider. Make sure you discuss any questions you have with your health care provider. Document Revised: 04/09/2017  Document Reviewed: 06/22/2016 Elsevier Patient Education  2020 Elsevier Inc. Moderate Conscious Sedation, Adult Sedation is the use of medicines to promote relaxation and relieve discomfort and anxiety. Moderate conscious sedation is   a type of sedation. Under moderate conscious sedation, you are less alert than normal, but you are still able to respond to instructions, touch, or both. Moderate conscious sedation is used during short medical and dental procedures. It is milder than deep sedation, which is a type of sedation under which you cannot be easily woken up. It is also milder than general anesthesia, which is the use of medicines to make you unconscious. Moderate conscious sedation allows you to return to your regular activities sooner. Tell a health care provider about:  Any allergies you have.  All medicines you are taking, including vitamins, herbs, eye drops, creams, and over-the-counter medicines.  Use of steroids (by mouth or creams).  Any problems you or family members have had with sedatives and anesthetic medicines.  Any blood disorders you have.  Any surgeries you have had.  Any medical conditions you have, such as sleep apnea.  Whether you are pregnant or may be pregnant.  Any use of cigarettes, alcohol, marijuana, or street drugs. What are the risks? Generally, this is a safe procedure. However, problems may occur, including:  Getting too much medicine (oversedation).  Nausea.  Allergic reaction to medicines.  Trouble breathing. If this happens, a breathing tube may be used to help with breathing. It will be removed when you are awake and breathing on your own.  Heart trouble.  Lung trouble. What happens before the procedure? Staying hydrated Follow instructions from your health care provider about hydration, which may include:  Up to 2 hours before the procedure - you may continue to drink clear liquids, such as water, clear fruit juice, black coffee,  and plain tea. Eating and drinking restrictions Follow instructions from your health care provider about eating and drinking, which may include:  8 hours before the procedure - stop eating heavy meals or foods such as meat, fried foods, or fatty foods.  6 hours before the procedure - stop eating light meals or foods, such as toast or cereal.  6 hours before the procedure - stop drinking milk or drinks that contain milk.  2 hours before the procedure - stop drinking clear liquids. Medicine Ask your health care provider about:  Changing or stopping your regular medicines. This is especially important if you are taking diabetes medicines or blood thinners.  Taking medicines such as aspirin and ibuprofen. These medicines can thin your blood. Do not take these medicines before your procedure if your health care provider instructs you not to.  Tests and exams  You will have a physical exam.  You may have blood tests done to show: ? How well your kidneys and liver are working. ? How well your blood can clot. General instructions  Plan to have someone take you home from the hospital or clinic.  If you will be going home right after the procedure, plan to have someone with you for 24 hours. What happens during the procedure?  An IV tube will be inserted into one of your veins.  Medicine to help you relax (sedative) will be given through the IV tube.  The medical or dental procedure will be performed. What happens after the procedure?  Your blood pressure, heart rate, breathing rate, and blood oxygen level will be monitored often until the medicines you were given have worn off.  Do not drive for 24 hours. This information is not intended to replace advice given to you by your health care provider. Make sure you discuss any questions you have with your  health care provider. Document Revised: 04/09/2017 Document Reviewed: 08/17/2015 Elsevier Patient Education  2020 [employer].

## 2019-10-13 NOTE — H&P (Signed)
Chief Complaint: Patient was seen in consultation today for squamous cell carcinoma of the head and neck  Referring Physician(s): Dustin Rios  Supervising Physician: Dustin Rios  Patient Status: Dustin Rios  History of Present Illness: Dustin Cabello Sr. is a 54 y.o. male with past medical history of sleep apnea, HLD, arthritis who presented to his dentist in Fall 2020 with jaw pain.  This initiated a work-up for cause which ultimately identified right neck mass with lymphadenopathy.  A lymph node biopsy in March demonstrated squamous cell carcinoma and subsequent tissue dissection revealed right tonsilar cancer.  Patient now with plans for upcoming chemoradiation treatment.  IR consulted for Port-A-Cath placement as well as gastrostomy tube placement.   [name] presents today in his usual state of health. He denies fever, chills, nausea, vomiting, abdominal pain, dysuria. He is currently eating and drinking well.  He has been NPO for the procedure today.  He does not take blood thinners.  He has not yet met with dietitian, however has met with cancer center coordinator for gastrostomy tube instructions and has supplies at home.     Past Medical History:  Diagnosis Date  . Arthritis   . Change in stool 02/22/2017  . Gout 02/22/2017  . Hyperlipidemia   . Sleep apnea    Uses CPAP  . Tonsil cancer [facility])    metastatic    Past Surgical History:  Procedure Laterality Date  . CARPAL TUNNEL RELEASE Right 04/10/2019  . DIRECT LARYNGOSCOPY N/A 08/21/2019   Procedure: DIRECT LARYNGOSCOPY - WITH BX;  Surgeon: Dustin Gala, MD;  Location: [place];  Service: ENT;  Laterality: N/A;  . NECK DISSECTION  09/11/2019  . RADICAL NECK DISSECTION Right 09/11/2019   Procedure: RADICAL NECK DISSECTION;  Surgeon: Dustin Gala, MD;  Location: [place];  Service: ENT;  Laterality: Right;  . TONSILLECTOMY Bilateral 08/21/2019   Procedure: TONSILLECTOMY;  Surgeon: Dustin Gala, MD;  Location: [place];  Service: ENT;  Laterality: Bilateral;  . WISDOM TOOTH EXTRACTION      Allergies: Patient has no known allergies.  Medications: Prior to Admission medications   Medication Sig Start Date End Date Taking? Authorizing Provider  Colchicine (MITIGARE) 0.6 MG CAPS Take 2 capsules at onset of flare. Then take 1 capsule 1 hour later.  May take 1 capsule up to twice daily thereafter. Patient not taking: Reported on 09/28/2019 04/04/19   Janora Norlander, DO  HYDROcodone-acetaminophen (HYCET) 7.5-325 mg/15 ml solution Take 15 mLs by mouth 4 (four) times daily as needed for moderate pain. Patient not taking: Reported on 09/28/2019 09/14/19   Dustin Gala, MD  loratadine (EQ LORATADINE) 10 MG tablet Take 10 mg by mouth daily.     [provider]  promethazine (PHENERGAN) 25 MG suppository Place 1 suppository (25 mg total) rectally every 6 (six) hours as needed for nausea or vomiting. Patient not taking: Reported on 09/28/2019 09/14/19   Dustin Gala, MD  sodium fluoride (PREVIDENT 5000 PLUS) 1.1 % CREA dental cream Apply to tooth brush or in fluoride trays as directed.  Repeat nightly. Spit out excess-DO NOT swallow. DO NOT rinse afterwards. 08/18/19   Lenn Cal, DDS  traZODone (DESYREL) 50 MG tablet Take 1 tablet (50 mg total) by mouth at bedtime as needed for sleep. Patient not taking: Reported on 10/05/2019 08/16/19 09/15/19  Tish Men, MD     Family History  Problem Relation Age of Onset  . COPD Mother   .  Uterine cancer Mother   . Lung cancer Brother   . Lung cancer Maternal Grandfather   . Stomach cancer Paternal Grandmother     Social History   Socioeconomic History  . Marital status: Married    Spouse name: Not on file  . Number of children: 1  . Years of education: Not on file  . Highest education level: Not on file  Occupational History  . Not on file  Tobacco Use  . Smoking status: Never Smoker  . Smokeless tobacco:  Never Used  Substance and Sexual Activity  . Alcohol use: No  . Drug use: No  . Sexual activity: Not on file  Other Topics Concern  . Not on file  Social History Narrative   Patient is married and has 1 son.   Patient has never smoked, used tobacco products.   Patient does not drink.  Patient does not use illicit drugs.   Social Determinants of Health   Financial Resource Strain:   . Difficulty of Paying Living Expenses:   Food Insecurity:   . Worried About Charity fundraiser in the Last Year:   . Arboriculturist in the Last Year:   Transportation Needs:   . Film/video editor (Medical):   Marland Kitchen Lack of Transportation (Non-Medical):   Physical Activity:   . Days of Exercise per Week:   . Minutes of Exercise per Session:   Stress:   . Feeling of Stress :   Social Connections:   . Frequency of Communication with Friends and Family:   . Frequency of Social Gatherings with Friends and Family:   . Attends Religious Services:   . Active Member of Clubs or Organizations:   . Attends Archivist Meetings:   Marland Kitchen Marital Status:      Review of Systems: A 12 point ROS discussed and pertinent positives are indicated in the HPI above.  All other systems are negative.  Review of Systems  Constitutional: Negative for fatigue and fever.  HENT: Positive for voice change.   Respiratory: Negative for cough and shortness of breath.   Cardiovascular: Negative for chest pain.  Gastrointestinal: Negative for abdominal pain.  Musculoskeletal: Negative for back pain.  Psychiatric/Behavioral: Negative for behavioral problems and confusion.    Vital Signs: There were no vitals taken for this visit.  Physical Exam Vitals and nursing note reviewed.  Constitutional:      General: He is not in acute distress.    Appearance: Normal appearance. He is not ill-appearing.  HENT:     Mouth/Throat:     Mouth: Mucous membranes are moist.     Pharynx: Oropharynx is clear.   Cardiovascular:     Rate and Rhythm: Normal rate and regular rhythm.  Pulmonary:     Effort: Pulmonary effort is normal. No respiratory distress.     Breath sounds: Normal breath sounds.  Abdominal:     General: Abdomen is flat. There is no distension.     Palpations: Abdomen is soft.  Skin:    General: Skin is warm and dry.  Neurological:     General: No focal deficit present.     Mental Status: He is alert and oriented to person, place, and time. Mental status is at baseline.  Psychiatric:        Mood and Affect: Mood normal.        Behavior: Behavior normal.        Thought Content: Thought content normal.  Judgment: Judgment normal.      MD Evaluation Airway: WNL Heart: WNL Abdomen: WNL Chest/ Lungs: WNL ASA  Classification: 3 Mallampati/Airway Score: Three   Imaging: No results found.  Labs:  CBC: Recent Labs    04/03/19 1138 08/16/19 0744 09/11/19 0935  WBC 7.7 5.0 6.4  HGB 15.5 14.4 14.4  HCT 45.4 43.7 45.0  PLT 253 198 202    COAGS: No results for input(s): INR, APTT in the last 8760 hours.  BMP: Recent Labs    04/03/19 1138 08/16/19 0744 09/11/19 0935  NA 140 142 139  K 4.3 4.3 4.0  CL 101 105 104  CO2 _0 GLUCOSE 79 112* 100*  BUN _1 CALCIUM 9.2 8.8* 8.7*  CREATININE 1.09 1.11 1.10  GFRNONAA 77 >60 >60  GFRAA 88 >60 >60    LIVER FUNCTION TESTS: Recent Labs    04/03/19 1138 08/16/19 0744  BILITOT 0.3 0.5  AST 24 19  ALT 32 24  ALKPHOS 92 65  PROT 7.3 7.1  ALBUMIN 4.3 3.7    TUMOR MARKERS: No results for input(s): AFPTM, CEA, CA199, CHROMGRNA in the last 8760 hours.  Assessment and Plan: Patient with past medical history of HTN, sleep apnea presents with complaint of squamous cell carcinoma of the head and neck.  IR consulted for Port-A-Cath placement and G-tube placement at the request of Dr. Irene Limbo. Case reviewed by Dr. Vernard Gambles who approves patient for procedure. The patient drank his barium as  instructed.  Patient presents today in their usual state of health.  He has been NPO and is not currently on blood thinners.   Risks and benefits image guided gastrostomy tube placement was discussed with the patient including, but not limited to the need for a barium enema during the procedure, bleeding, infection, peritonitis and/or damage to adjacent structures.  All of the patient's questions were answered, patient is agreeable to proceed.  Consent signed and in chart.  Risks and benefits of image guided port-a-catheter placement was discussed with the patient including, but not limited to bleeding, infection, pneumothorax, or fibrin sheath development and need for additional procedures.  All of the patient's questions were answered, patient is agreeable to proceed. Consent signed and in chart.  Thank you for this interesting consult.  I greatly enjoyed meeting Junius Faucett Sr. and look forward to participating in their care.  A copy of this report was sent to the requesting provider on this date.  Electronically Signed: Docia Barrier, PA 10/13/2019, 8:59 AM   I spent a total of  30 Minutes   in face to face in clinical consultation, greater than 50% of which was counseling/coordinating care for squamous cell carcinoma of the head and neck.

## 2019-10-13 NOTE — Procedures (Signed)
  Procedure: R IJ Port catheter placement   Gastrostomy catheter placement 27f EBL:   minimal Complications:  none immediate  See full dictation in [facility].  Dillard Cannon MD Main # [phone] Pager  [phone]

## 2019-10-16 DIAGNOSIS — Z510 Encounter for antineoplastic radiation therapy: Secondary | ICD-10-CM | POA: Diagnosis not present

## 2019-10-16 NOTE — Progress Notes (Signed)
Pharmacist Chemotherapy Monitoring - Initial Assessment    Anticipated start date: 10/20/19   Regimen:  . Are orders appropriate based on the patient's diagnosis, regimen, and cycle? Yes . Does the plan date match the patient's scheduled date? Yes . Is the sequencing of drugs appropriate? Yes . Are the premedications appropriate for the patient's regimen? Yes . Prior Authorization for treatment is: Not Started o If applicable, is the correct biosimilar selected based on the patient's insurance? not applicable  Organ Function and Labs: Marland Kitchen Are dose adjustments needed based on the patient's renal function, hepatic function, or hematologic function? No . Are appropriate labs ordered prior to the start of patient's treatment? Yes . Other organ system assessment, if indicated: N/A . The following baseline labs, if indicated, have been ordered: cisplatin: K, Mg  Dose Assessment: . Are the drug doses appropriate? Yes . Are the following correct: o Drug concentrations Yes o IV fluid compatible with drug Yes o Administration routes Yes o Timing of therapy Yes . If applicable, does the patient have documented access for treatment and/or plans for port-a-cath placement? yes . If applicable, have lifetime cumulative doses been properly documented and assessed? yes Lifetime Dose Tracking  No doses have been documented on this patient for the following tracked chemicals: Doxorubicin, Epirubicin, Idarubicin, Daunorubicin, Mitoxantrone, Bleomycin, Oxaliplatin, Carboplatin, Liposomal Doxorubicin  o   Toxicity Monitoring/Prevention: . The patient has the following take home antiemetics prescribed: phenergan suppository . The patient has the following take home medications prescribed: N/A . Medication allergies and previous infusion related reactions, if applicable, have been reviewed and addressed. Yes . The patient's current medication list has been assessed for drug-drug interactions with their  chemotherapy regimen. No significant interactions identified.  Order Review: . Are the treatment plan orders signed? Yes . Is the patient scheduled to see a provider prior to their treatment? No  I verify that I have reviewed each item in the above checklist and answered each question accordingly.  [facility] 10/16/2019 3:54 PM

## 2019-10-17 ENCOUNTER — Telehealth: Payer: Self-pay | Admitting: Hematology

## 2019-10-17 NOTE — Telephone Encounter (Signed)
Scheduled appt per 6/3 sch message - unable to reach pt . Left message with appt date and time for this wk

## 2019-10-18 ENCOUNTER — Other Ambulatory Visit: Payer: Self-pay | Admitting: Hematology

## 2019-10-18 MED ORDER — PROCHLORPERAZINE MALEATE 10 MG PO TABS
10.0000 mg | ORAL_TABLET | Freq: Four times a day (QID) | ORAL | 0 refills | Status: DC | PRN
Start: 2019-10-18 — End: 2020-03-21

## 2019-10-18 MED ORDER — LIDOCAINE-PRILOCAINE 2.5-2.5 % EX KIT: PACK | Freq: Once | CUTANEOUS | 2 refills | Status: AC

## 2019-10-18 MED ORDER — ONDANSETRON HCL 8 MG PO TABS
8.0000 mg | ORAL_TABLET | Freq: Three times a day (TID) | ORAL | 0 refills | Status: DC | PRN
Start: 2019-10-18 — End: 2019-12-14

## 2019-10-19 ENCOUNTER — Ambulatory Visit
Admission: RE | Admit: 2019-10-19 | Discharge: 2019-10-19 | Disposition: A | Discharge status: Home / Self Care | Payer: 59 | Source: Ambulatory Visit | Attending: Radiation Oncology | Admitting: Radiation Oncology

## 2019-10-19 ENCOUNTER — Telehealth: Payer: Self-pay | Admitting: *Deleted

## 2019-10-19 ENCOUNTER — Other Ambulatory Visit: Payer: Self-pay

## 2019-10-19 ENCOUNTER — Inpatient Hospital Stay: Payer: 59 | Attending: Hematology

## 2019-10-19 DIAGNOSIS — Z809 Family history of malignant neoplasm, unspecified: Secondary | ICD-10-CM | POA: Insufficient documentation

## 2019-10-19 DIAGNOSIS — Z801 Family history of malignant neoplasm of trachea, bronchus and lung: Secondary | ICD-10-CM | POA: Insufficient documentation

## 2019-10-19 DIAGNOSIS — Z510 Encounter for antineoplastic radiation therapy: Secondary | ICD-10-CM | POA: Diagnosis not present

## 2019-10-19 DIAGNOSIS — Z5111 Encounter for antineoplastic chemotherapy: Secondary | ICD-10-CM | POA: Insufficient documentation

## 2019-10-19 DIAGNOSIS — E860 Dehydration: Secondary | ICD-10-CM | POA: Insufficient documentation

## 2019-10-19 DIAGNOSIS — E785 Hyperlipidemia, unspecified: Secondary | ICD-10-CM | POA: Insufficient documentation

## 2019-10-19 DIAGNOSIS — R310 Gross hematuria: Secondary | ICD-10-CM | POA: Insufficient documentation

## 2019-10-19 DIAGNOSIS — C773 Secondary and unspecified malignant neoplasm of axilla and upper limb lymph nodes: Secondary | ICD-10-CM | POA: Insufficient documentation

## 2019-10-19 DIAGNOSIS — G4730 Sleep apnea, unspecified: Secondary | ICD-10-CM | POA: Insufficient documentation

## 2019-10-19 DIAGNOSIS — C099 Malignant neoplasm of tonsil, unspecified: Secondary | ICD-10-CM | POA: Insufficient documentation

## 2019-10-19 DIAGNOSIS — Z800 Family history of malignant neoplasm of digestive organs: Secondary | ICD-10-CM | POA: Insufficient documentation

## 2019-10-19 DIAGNOSIS — M47892 Other spondylosis, cervical region: Secondary | ICD-10-CM | POA: Insufficient documentation

## 2019-10-19 NOTE — Telephone Encounter (Signed)
Talked with pt & answered some questions.  Informed to go to [facility] website & look at virtual tour.  Reminded of fluid instructions & need to measure urine while here.

## 2019-10-20 ENCOUNTER — Encounter: Payer: Self-pay | Admitting: Hematology

## 2019-10-20 ENCOUNTER — Inpatient Hospital Stay: Payer: 59

## 2019-10-20 ENCOUNTER — Inpatient Hospital Stay: Payer: 59 | Admitting: Nutrition

## 2019-10-20 ENCOUNTER — Ambulatory Visit
Admission: RE | Admit: 2019-10-20 | Discharge: 2019-10-20 | Disposition: A | Discharge status: Home / Self Care | Payer: 59 | Source: Ambulatory Visit | Attending: Radiation Oncology | Admitting: Radiation Oncology

## 2019-10-20 ENCOUNTER — Other Ambulatory Visit: Payer: Self-pay

## 2019-10-20 VITALS — BP 137/83 | HR 80 | Temp 98.3°F | Resp 18

## 2019-10-20 DIAGNOSIS — E860 Dehydration: Secondary | ICD-10-CM | POA: Diagnosis not present

## 2019-10-20 DIAGNOSIS — R310 Gross hematuria: Secondary | ICD-10-CM | POA: Diagnosis not present

## 2019-10-20 DIAGNOSIS — M47892 Other spondylosis, cervical region: Secondary | ICD-10-CM | POA: Diagnosis not present

## 2019-10-20 DIAGNOSIS — Z801 Family history of malignant neoplasm of trachea, bronchus and lung: Secondary | ICD-10-CM | POA: Diagnosis not present

## 2019-10-20 DIAGNOSIS — E785 Hyperlipidemia, unspecified: Secondary | ICD-10-CM | POA: Diagnosis not present

## 2019-10-20 DIAGNOSIS — C773 Secondary and unspecified malignant neoplasm of axilla and upper limb lymph nodes: Secondary | ICD-10-CM | POA: Diagnosis not present

## 2019-10-20 DIAGNOSIS — Z5111 Encounter for antineoplastic chemotherapy: Secondary | ICD-10-CM | POA: Diagnosis not present

## 2019-10-20 DIAGNOSIS — Z7189 Other specified counseling: Secondary | ICD-10-CM

## 2019-10-20 DIAGNOSIS — Z510 Encounter for antineoplastic radiation therapy: Secondary | ICD-10-CM | POA: Diagnosis not present

## 2019-10-20 DIAGNOSIS — Z809 Family history of malignant neoplasm, unspecified: Secondary | ICD-10-CM | POA: Diagnosis not present

## 2019-10-20 DIAGNOSIS — C099 Malignant neoplasm of tonsil, unspecified: Secondary | ICD-10-CM

## 2019-10-20 DIAGNOSIS — G4730 Sleep apnea, unspecified: Secondary | ICD-10-CM | POA: Diagnosis not present

## 2019-10-20 DIAGNOSIS — C090 Malignant neoplasm of tonsillar fossa: Secondary | ICD-10-CM

## 2019-10-20 DIAGNOSIS — Z800 Family history of malignant neoplasm of digestive organs: Secondary | ICD-10-CM | POA: Diagnosis not present

## 2019-10-20 LAB — CBC WITH DIFFERENTIAL/PLATELET
Abs Immature Granulocytes: 0.02 10*3/uL (ref 0.00–0.07)
Basophils Absolute: 0.1 10*3/uL (ref 0.0–0.1)
Basophils Relative: 1 %
Eosinophils Absolute: 0.1 10*3/uL (ref 0.0–0.5)
Eosinophils Relative: 2 %
HCT: 44.5 % (ref 39.0–52.0)
Hemoglobin: 14.7 g/dL (ref 13.0–17.0)
Immature Granulocytes: 0 %
Lymphocytes Relative: 25 %
Lymphs Abs: 1.7 10*3/uL (ref 0.7–4.0)
MCH: 29.6 pg (ref 26.0–34.0)
MCHC: 33 g/dL (ref 30.0–36.0)
MCV: 89.5 fL (ref 80.0–100.0)
Monocytes Absolute: 0.5 10*3/uL (ref 0.1–1.0)
Monocytes Relative: 7 %
Neutro Abs: 4.6 10*3/uL (ref 1.7–7.7)
Neutrophils Relative %: 65 %
Platelets: 250 10*3/uL (ref 150–400)
RBC: 4.97 MIL/uL (ref 4.22–5.81)
RDW: 13.2 % (ref 11.5–15.5)
WBC: 7 10*3/uL (ref 4.0–10.5)
nRBC: 0 % (ref 0.0–0.2)

## 2019-10-20 LAB — BASIC METABOLIC PANEL
Anion gap: 13 (ref 5–15)
BUN: 13 mg/dL (ref 6–20)
CO2: 26 mmol/L (ref 22–32)
Calcium: 9.3 mg/dL (ref 8.9–10.3)
Chloride: 101 mmol/L (ref 98–111)
Creatinine, Ser: 1.16 mg/dL (ref 0.61–1.24)
GFR calc Af Amer: >60 mL/min (ref >60)
GFR calc non Af Amer: >60 mL/min (ref >60)
Glucose, Bld: 100 mg/dL — ABNORMAL HIGH (ref 70–99)
Potassium: 4.1 mmol/L (ref 3.5–5.1)
Sodium: 140 mmol/L (ref 135–145)

## 2019-10-20 LAB — MAGNESIUM: Magnesium: 1.9 mg/dL (ref 1.7–2.4)

## 2019-10-20 MED ORDER — SODIUM CHLORIDE 0.9% FLUSH
10.0000 mL | Freq: Once | INTRAVENOUS | Status: AC
  Administered 2019-10-20: 10 mL via INTRAVENOUS
  Filled 2019-10-20: qty 10

## 2019-10-20 MED ORDER — PALONOSETRON HCL INJECTION 0.25 MG/5ML
0.2500 mg | Freq: Once | INTRAVENOUS | Status: AC
  Administered 2019-10-20: 0.25 mg via INTRAVENOUS

## 2019-10-20 MED ORDER — HEPARIN SOD (PORK) LOCK FLUSH 100 UNIT/ML IV SOLN
500.0000 [IU] | Freq: Once | INTRAVENOUS | Status: AC | PRN
  Administered 2019-10-20: 500 [IU]
  Filled 2019-10-20: qty 5

## 2019-10-20 MED ORDER — SODIUM CHLORIDE 0.9 % IV SOLN
Freq: Once | INTRAVENOUS | Status: AC
  Filled 2019-10-20: qty 250

## 2019-10-20 MED ORDER — SODIUM CHLORIDE 0.9% FLUSH
10.0000 mL | INTRAVENOUS | Status: DC | PRN
  Administered 2019-10-20: 10 mL
  Filled 2019-10-20: qty 10

## 2019-10-20 MED ORDER — SODIUM CHLORIDE 0.9 % IV SOLN
150.0000 mg | Freq: Once | INTRAVENOUS | Status: AC
  Administered 2019-10-20: 150 mg via INTRAVENOUS
  Filled 2019-10-20: qty 150

## 2019-10-20 MED ORDER — PALONOSETRON HCL INJECTION 0.25 MG/5ML
INTRAVENOUS | Status: AC
  Filled 2019-10-20: qty 5

## 2019-10-20 MED ORDER — SODIUM CHLORIDE 0.9 % IV SOLN
39.3000 mg/m2 | Freq: Once | INTRAVENOUS | Status: AC
  Administered 2019-10-20: 100 mg via INTRAVENOUS
  Filled 2019-10-20: qty 100

## 2019-10-20 MED ORDER — POTASSIUM CHLORIDE 2 MEQ/ML IV SOLN
Freq: Once | INTRAVENOUS | Status: AC
  Filled 2019-10-20: qty 10

## 2019-10-20 MED ORDER — SODIUM CHLORIDE 0.9 % IV SOLN
10.0000 mg | Freq: Once | INTRAVENOUS | Status: AC
  Administered 2019-10-20: 10 mg via INTRAVENOUS
  Filled 2019-10-20: qty 10

## 2019-10-20 NOTE — Patient Instructions (Signed)
[facility] Discharge Instructions for Patients Receiving Chemotherapy  Today you received the following chemotherapy agents: Cisplatin  To help prevent nausea and vomiting after your treatment, we encourage you to take your nausea medication as directed.   If you develop nausea and vomiting that is not controlled by your nausea medication, call the clinic.   BELOW ARE SYMPTOMS THAT SHOULD BE REPORTED IMMEDIATELY:  *FEVER GREATER THAN 100.5 F  *CHILLS WITH OR WITHOUT FEVER  NAUSEA AND VOMITING THAT IS NOT CONTROLLED WITH YOUR NAUSEA MEDICATION  *UNUSUAL SHORTNESS OF BREATH  *UNUSUAL BRUISING OR BLEEDING  TENDERNESS IN MOUTH AND THROAT WITH OR WITHOUT PRESENCE OF ULCERS  *URINARY PROBLEMS  *BOWEL PROBLEMS  UNUSUAL RASH Items with * indicate a potential emergency and should be followed up as soon as possible.  Feel free to call the clinic should you have any questions or concerns. The clinic phone number is [phone].  Please show the CHEMO ALERT CARD at check-in to the Emergency Department and triage nurse.  Cisplatin injection What is this medicine? CISPLATIN (SIS pla tin) is a chemotherapy drug. It targets fast dividing cells, like cancer cells, and causes these cells to die. This medicine is used to treat many types of cancer like bladder, ovarian, and testicular cancers. This medicine may be used for other purposes; ask your health care provider or pharmacist if you have questions. COMMON BRAND NAME(S): Platinol, Platinol -AQ What should I tell my health care provider before I take this medicine? They need to know if you have any of these conditions:  eye disease, vision problems  hearing problems  kidney disease  low blood counts, like white cells, platelets, or red blood cells  tingling of the fingers or toes, or other nerve disorder  an unusual or allergic reaction to cisplatin, carboplatin, oxaliplatin, other medicines, foods, dyes, or  preservatives  pregnant or trying to get pregnant  breast-feeding How should I use this medicine? This drug is given as an infusion into a vein. It is administered in a hospital or clinic by a specially trained health care professional. Talk to your pediatrician regarding the use of this medicine in children. Special care may be needed. Overdosage: If you think you have taken too much of this medicine contact a poison control center or emergency room at once. NOTE: This medicine is only for you. Do not share this medicine with others. What if I miss a dose? It is important not to miss a dose. Call your doctor or health care professional if you are unable to keep an appointment. What may interact with this medicine? This medicine may interact with the following medications:  foscarnet  certain antibiotics like amikacin, gentamicin, neomycin, polymyxin B, streptomycin, tobramycin, vancomycin This list may not describe all possible interactions. Give your health care provider a list of all the medicines, herbs, non-prescription drugs, or dietary supplements you use. Also tell them if you smoke, drink alcohol, or use illegal drugs. Some items may interact with your medicine. What should I watch for while using this medicine? Your condition will be monitored carefully while you are receiving this medicine. You will need important blood work done while you are taking this medicine. This drug may make you feel generally unwell. This is not uncommon, as chemotherapy can affect healthy cells as well as cancer cells. Report any side effects. Continue your course of treatment even though you feel ill unless your doctor tells you to stop. This medicine may increase your   risk of getting an infection. Call your healthcare professional for advice if you get a fever, chills, or sore throat, or other symptoms of a cold or flu. Do not treat yourself. Try to avoid being around people who are sick. Avoid taking  medicines that contain aspirin, acetaminophen, ibuprofen, naproxen, or ketoprofen unless instructed by your healthcare professional. These medicines may hide a fever. This medicine may increase your risk to bruise or bleed. Call your doctor or health care professional if you notice any unusual bleeding. Be careful brushing and flossing your teeth or using a toothpick because you may get an infection or bleed more easily. If you have any dental work done, tell your dentist you are receiving this medicine. Do not become pregnant while taking this medicine or for 14 months after stopping it. Women should inform their healthcare professional if they wish to become pregnant or think they might be pregnant. Men should not father a child while taking this medicine and for 11 months after stopping it. There is potential for serious side effects to an unborn child. Talk to your healthcare professional for more information. Do not breast-feed an infant while taking this medicine. This medicine has caused ovarian failure in some women. This medicine may make it more difficult to get pregnant. Talk to your healthcare professional if you are concerned about your fertility. This medicine has caused decreased sperm counts in some men. This may make it more difficult to father a child. Talk to your healthcare professional if you are concerned about your fertility. Drink fluids as directed while you are taking this medicine. This will help protect your kidneys. Call your doctor or health care professional if you get diarrhea. Do not treat yourself. What side effects may I notice from receiving this medicine? Side effects that you should report to your doctor or health care professional as soon as possible:  allergic reactions like skin rash, itching or hives, swelling of the face, lips, or tongue  blurred vision  changes in vision  decreased hearing or ringing of the ears  nausea, vomiting  pain, redness, or  irritation at site where injected  pain, tingling, numbness in the hands or feet  signs and symptoms of bleeding such as bloody or black, tarry stools; red or dark brown urine; spitting up blood or brown material that looks like coffee grounds; red spots on the skin; unusual bruising or bleeding from the eyes, gums, or nose  signs and symptoms of infection like fever; chills; cough; sore throat; pain or trouble passing urine  signs and symptoms of kidney injury like trouble passing urine or change in the amount of urine  signs and symptoms of low red blood cells or anemia such as unusually weak or tired; feeling faint or lightheaded; falls; breathing problems Side effects that usually do not require medical attention (report to your doctor or health care professional if they continue or are bothersome):  loss of appetite  mouth sores  muscle cramps This list may not describe all possible side effects. Call your doctor for medical advice about side effects. You may report side effects to FDA at 1-800-FDA-1088. Where should I keep my medicine? This drug is given in a hospital or clinic and will not be stored at home. NOTE: This sheet is a summary. It may not cover all possible information. If you have questions about this medicine, talk to your doctor, pharmacist, or health care provider.  2020 [employer]vier/Gold Standard (2018-04-22 15:59:17)  

## 2019-10-20 NOTE — Progress Notes (Signed)
Met w/ pt to introduce myself as his Arboriculturist.  Unfortunately there are no foundations offering copay assistance for his Dx and the type of ins he has.  I offered the [employer], went over what it covers and gave him an expense sheet.  Pt declined the grant at this time.  I gave him my card in case he changes his mind and for any questions or concerns he may have in the future.

## 2019-10-20 NOTE — Progress Notes (Signed)
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 Noted weight dropped to 280 pounds around May 3 after surgeries.  Last weight documented is 295 pounds on June 4. Labs were reviewed. Patient reports he is eating normally at this time and denies nutrition impact symptoms.  Nutrition diagnosis: Food and nutrition related knowledge deficit improved.  Intervention: Reviewed strategies for eating small amounts of food more often.  Educated on increasing fluids and using salt and baking soda water rinses to improve thick secretions. Recommended patient begin oral nutrition supplement sampling and find one that he enjoys. Discouraged weight loss throughout treatment. Patient has fact sheets and contact information from initial visit.  Monitoring, evaluation, goals: Patient will tolerate adequate calories and protein to minimize weight loss.  Next visit: Tuesday, June 15 after radiation therapy.  **Disclaimer: This note was dictated with voice recognition software. Similar sounding words can inadvertently be transcribed and this note may contain transcription errors which may not have been corrected upon publication of note.**

## 2019-10-23 ENCOUNTER — Other Ambulatory Visit: Payer: Self-pay

## 2019-10-23 ENCOUNTER — Ambulatory Visit
Admission: RE | Admit: 2019-10-23 | Discharge: 2019-10-23 | Disposition: A | Discharge status: Home / Self Care | Payer: 59 | Source: Ambulatory Visit | Attending: Radiation Oncology | Admitting: Radiation Oncology

## 2019-10-23 DIAGNOSIS — Z510 Encounter for antineoplastic radiation therapy: Secondary | ICD-10-CM | POA: Diagnosis not present

## 2019-10-24 ENCOUNTER — Ambulatory Visit
Admission: RE | Admit: 2019-10-24 | Discharge: 2019-10-24 | Disposition: A | Discharge status: Home / Self Care | Payer: 59 | Source: Ambulatory Visit | Attending: Radiation Oncology | Admitting: Radiation Oncology

## 2019-10-24 ENCOUNTER — Other Ambulatory Visit: Payer: Self-pay

## 2019-10-24 ENCOUNTER — Inpatient Hospital Stay: Payer: 59 | Admitting: Nutrition

## 2019-10-24 DIAGNOSIS — Z510 Encounter for antineoplastic radiation therapy: Secondary | ICD-10-CM | POA: Diagnosis not present

## 2019-10-24 NOTE — Progress Notes (Signed)
Oncology Nurse Navigator Documentation  Dustin Rios called me today reporting severe constipation for one week. He has been straining to have a bowel movement. I have advised daily miralax at a minimum and also advised 2 times daily until a bowel movement has been achieved. I have also advised sennakot-s or the generic equivalent twice daily until a bowel movement is achieved. We have moved his radiation treatment to later in the day and he will see Dr. Sondra Come today for his regular weekly doctor visit. I have notified Corporate investment banker of the above information. Dustin Rios verbalized understanding of the above.   Harlow Asa RN, BSN, OCN Head & Neck Oncology Nurse [place] [facility] Phone # [phone]  Fax # [phone]

## 2019-10-25 ENCOUNTER — Inpatient Hospital Stay: Payer: 59

## 2019-10-25 ENCOUNTER — Inpatient Hospital Stay (HOSPITAL_BASED_OUTPATIENT_CLINIC_OR_DEPARTMENT_OTHER): Payer: 59 | Admitting: Hematology

## 2019-10-25 ENCOUNTER — Ambulatory Visit
Admission: RE | Admit: 2019-10-25 | Discharge: 2019-10-25 | Disposition: A | Discharge status: Home / Self Care | Payer: 59 | Source: Ambulatory Visit | Attending: Radiation Oncology | Admitting: Radiation Oncology

## 2019-10-25 ENCOUNTER — Other Ambulatory Visit: Payer: Self-pay

## 2019-10-25 VITALS — BP 117/80 | HR 80 | Temp 98.1°F | Resp 18 | Ht 68.0 in | Wt 284.8 lb

## 2019-10-25 DIAGNOSIS — Z7189 Other specified counseling: Secondary | ICD-10-CM

## 2019-10-25 DIAGNOSIS — C099 Malignant neoplasm of tonsil, unspecified: Secondary | ICD-10-CM

## 2019-10-25 DIAGNOSIS — Z95828 Presence of other vascular implants and grafts: Secondary | ICD-10-CM

## 2019-10-25 DIAGNOSIS — Z5111 Encounter for antineoplastic chemotherapy: Secondary | ICD-10-CM | POA: Diagnosis not present

## 2019-10-25 DIAGNOSIS — C090 Malignant neoplasm of tonsillar fossa: Secondary | ICD-10-CM

## 2019-10-25 DIAGNOSIS — Z510 Encounter for antineoplastic radiation therapy: Secondary | ICD-10-CM | POA: Diagnosis not present

## 2019-10-25 LAB — CBC WITH DIFFERENTIAL/PLATELET
Abs Immature Granulocytes: 0.03 10*3/uL (ref 0.00–0.07)
Basophils Absolute: 0 10*3/uL (ref 0.0–0.1)
Basophils Relative: 0 %
Eosinophils Absolute: 0.1 10*3/uL (ref 0.0–0.5)
Eosinophils Relative: 1 %
HCT: 45.4 % (ref 39.0–52.0)
Hemoglobin: 15.2 g/dL (ref 13.0–17.0)
Immature Granulocytes: 0 %
Lymphocytes Relative: 22 %
Lymphs Abs: 2.2 10*3/uL (ref 0.7–4.0)
MCH: 29.8 pg (ref 26.0–34.0)
MCHC: 33.5 g/dL (ref 30.0–36.0)
MCV: 89 fL (ref 80.0–100.0)
Monocytes Absolute: 0.8 10*3/uL (ref 0.1–1.0)
Monocytes Relative: 8 %
Neutro Abs: 6.7 10*3/uL (ref 1.7–7.7)
Neutrophils Relative %: 69 %
Platelets: 258 10*3/uL (ref 150–400)
RBC: 5.1 MIL/uL (ref 4.22–5.81)
RDW: 13.1 % (ref 11.5–15.5)
WBC: 9.8 10*3/uL (ref 4.0–10.5)
nRBC: 0 % (ref 0.0–0.2)

## 2019-10-25 LAB — MAGNESIUM: Magnesium: 2.2 mg/dL (ref 1.7–2.4)

## 2019-10-25 LAB — BASIC METABOLIC PANEL
Anion gap: 9 (ref 5–15)
BUN: 19 mg/dL (ref 6–20)
CO2: 29 mmol/L (ref 22–32)
Calcium: 9.2 mg/dL (ref 8.9–10.3)
Chloride: 98 mmol/L (ref 98–111)
Creatinine, Ser: 1.37 mg/dL — ABNORMAL HIGH (ref 0.61–1.24)
GFR calc Af Amer: >60 mL/min (ref >60)
GFR calc non Af Amer: 58 mL/min — ABNORMAL LOW (ref >60)
Glucose, Bld: 84 mg/dL (ref 70–99)
Potassium: 4.2 mmol/L (ref 3.5–5.1)
Sodium: 136 mmol/L (ref 135–145)

## 2019-10-25 MED ORDER — SENNOSIDES-DOCUSATE SODIUM 8.6-50 MG PO TABS
2.0000 | ORAL_TABLET | Freq: Every evening | ORAL | 1 refills | Status: DC | PRN
Start: 2019-10-25 — End: 2020-03-21

## 2019-10-25 MED ORDER — SODIUM CHLORIDE 0.9% FLUSH
10.0000 mL | INTRAVENOUS | Status: DC | PRN
  Administered 2019-10-25: 10 mL via INTRAVENOUS
  Filled 2019-10-25: qty 10

## 2019-10-25 MED ORDER — PANTOPRAZOLE SODIUM 40 MG PO TBEC
40.0000 mg | DELAYED_RELEASE_TABLET | Freq: Every day | ORAL | 2 refills | Status: DC
Start: 2019-10-25 — End: 2020-08-20

## 2019-10-25 NOTE — Progress Notes (Signed)
Patient stayed accessed for treatment on 6/17.

## 2019-10-25 NOTE — Patient Instructions (Signed)

## 2019-10-25 NOTE — Progress Notes (Signed)
HEMATOLOGY/ONCOLOGY CONSULTATION NOTE  Date of Service: 10/25/2019  Patient Care Team: Claretta Fraise, MD as PCP - General (Family Medicine) Jodi Marble, MD as Consulting Physician (Otolaryngology) Eppie Gibson, MD as Attending Physician (Radiation Oncology) Tish Men, MD as Consulting Physician (Hematology) Leota Sauers, RN (Inactive) as Oncology Nurse Navigator Malmfelt, Stephani Police, RN as Oncology Nurse Navigator (Oncology)  REFERRING PHYSICIAN: Claretta Fraise, MD  CHIEF COMPLAINTS/PURPOSE OF CONSULTATION:  Squamous Cell Carcinoma of the Head and Neck  HISTORY OF PRESENTING ILLNESS:   Dustin Leiter Sr. is a wonderful 54 y.o. male who has been referred to [place] by Claretta Fraise, MD for evaluation and management of Squamous Cell Carcinoma of the Head and Neck. Pt is accompanied today by [name], wife. The pt reports that he is doing well overall.   The pt reports he is fine. Pt has had both of his COVID19 vaccine. He is healing well from surgery. Some of the nerves were damaged and he has been working it. Part of his salivary gland on the right side was taken out and it burns when he starts to eat. Pt has seen swallowing evaluation today prior to coming. He is back to eating whatever he wants to now. When swallowing he has a burning sensation. Pt has a sleep apnea machine that dries out his throat. Pt also cannot open mouth completely since the dissection.  Pt had a biopsy on March 17th and found it was a squamous cell carcinoma. From his surgery he has various burning, tingling and numbness around his head, neck and shoulder.   Pt has sleep apnea. He also had gout but stopped drinking tea and has not had a flair up since the end of last year. He has no other major medical problems and works with Forensic psychologist. He has had slight hearing loss over the years due to job and being in the [employer]. Pt has never smoked, but his mother and brohter smoked a lot and pt was exposed  to second hand smoke for about 23 years. Many of the pt's family members especially from his mother side have had cancer's that were very aggressive and they passed at early ages.   Of note prior to the patient's visit today, pt has had Surgical Pathology ([id number]) completed on 09/11/19 with results revealing "LYMPH NODE, MODIFIED RADICAL NECK I-IV, REGIONAL DISSECTION: Metastatic carcinoma in 3 of 49 lymph nodes (3/49), with extracapsular extension." Pt has had Surgical Pathology ([id number]) completed on 08/21/19 with results revealing "A. TONSIL, RIGHT: Squamous cell carcinoma, basaloid, p16 positive.  Margins not involved.  Carcinoma focally 1 mm from surgical margin.  See oncology table and comment.  B. TONSIL, LEFT:  Benign tonsil.  No malignancy identified. C. TONGUE, RIGHT BASE, BIOPSY:  Benign tonsillar tissue. No malignancy identified. D. TONGUE, LEFT BASE, BIOPSY: Benign tonsillar tissue.  No malignancy identified. "  Pt has had PET Skull Base to Thigh ([id number]) completed on 08/10/19 with results revealing "1. The 2.8 cm right level IIa lymph node has a maximum SUV of 28.4. A primary lesion is not readily seen. There is some asymmetric palatine tonsillar activity with the left tonsil having maximum SUV of 12.4 and the right 7.8, but no tonsillar mass is observed on the CT data. 2. No compelling findings of active malignancy in the chest, abdomen/pelvis, or skeleton. There is some accentuated anal activity which is likely physiologic given the lack of visible abnormality on the CT data. 3. Other imaging findings of potential  clinical significance: Diffuse hepatic steatosis. Hypodense left renal lesion is probably a cyst but technically too small to characterize." Pt has had [place] Core Biopsy ([id number]) completed on  07/26/19 with results revealing "Successful ultrasound-guided right cervical lymph node/lesion Biopsy." Pt has had of CT Soft Tissue Neck ([id number]) completed on 07/13/19  with results revealing "4.1 x 3.2 cm necrotic/cystic node or nodal conglomerate at the right level II station likely reflecting nodal metastatic disease. No definite primary mass is identified within the oral cavity, pharynx or larynx. Correlate with direct visualization and consider direct tissue sampling. Additionally, PET-CT may be helpful. Cervical spondylosis as described. A prominent C6-C7 posterior disc osteophyte contributes to at least moderate bony spinal canal stenosis. Multilevel neural foraminal narrowing."  Most recent lab results (09/11/19) of CBC is as follows: all values are WNL except for Glucose at 100, Calcium at 8.7  On review of systems, pt reports various tingling, numbness, burnings sensations around head neck and shoulder, weight loss, insomnia and denies back pain, abdominal pain and any other symptoms.   On PMHx the pt reports tonsillectomy 08/21/19, radical neck dissection 09/11/19, sleep apnea, gout, wisdom tooth extraction, carpal tunnel surgery    On Social Hx the pt reports never smoked, smoked marijuana 30 years ago, works Forensic psychologist, pt was in [employer]   On Family Hx the pt reports brother metastatic lung cancer at age 39, E[place]worth had liver cancer and pancreatic cancer in early 37's of age, mother has uterine cancer at age 40, grandmother on fathers side has stomach cancer, great grandmother had lung cancer    INTERVAL HISTORY:   Dustin Mcfadyen Sr. is a wonderful 54 y.o. male who is here for evaluation and management of Squamous Cell Carcinoma of the Head and Neck. He is here prior to C2 Cisplatin. The patient's last visit with [place] was on 09/28/2019. The pt reports that he is doing well overall.  The pt reports that he began having hiccups the night after treatment and a significant amount of acid reflux. His hiccups have since resolved, but he is now experiencing nausea. He was constipated on Friday - Sunday and was finally able to have a bowel  movement on Monday after using Magnesium Citrate. Pt was taking Dulcolax during this time. Pt did have significant blood in his stools on Monday. His bowel movements have been regular since then and he has only seen a small amount of blood on the toilet tissue. He has no history of hemorrhoids or anal fissures.   Pt has tried to stay active by walking daily and is drinking water regularly. He had his last gout flare a few weeks ago which required Colchicine. He is not currently taking Allopurinol.   Lab results today (10/25/19) of CBC w/diff and BMP is as follows: all values are WNL except for Creatinine at 1.37, GFR Est Non Af Am at 58. 10/25/2019 Magnesium at 2.2  On review of systems, pt reports acid reflux, nausea and denies pain at port site, abdominal pain and any other symptoms.   MEDICAL HISTORY:  Past Medical History:  Diagnosis Date   Arthritis    Change in stool 02/22/2017   Gout 02/22/2017   Hyperlipidemia    Sleep apnea    Uses CPAP   Tonsil cancer [facility])    metastatic     SURGICAL HISTORY: Past Surgical History:  Procedure Laterality Date   CARPAL TUNNEL RELEASE Right 04/10/2019   DIRECT LARYNGOSCOPY N/A 08/21/2019   Procedure: DIRECT LARYNGOSCOPY -  WITH BX;  Surgeon: [name], MD;  Location: [place];  Service: ENT;  Laterality: N/A;   IR GASTROSTOMY TUBE MOD SED  10/13/2019   IR IMAGING GUIDED PORT INSERTION  10/13/2019   NECK DISSECTION  09/11/2019   RADICAL NECK DISSECTION Right 09/11/2019   Procedure: RADICAL NECK DISSECTION;  Surgeon: [name], MD;  Location: [place];  Service: ENT;  Laterality: Right;   TONSILLECTOMY Bilateral 08/21/2019   Procedure: TONSILLECTOMY;  Surgeon: [name], MD;  Location: [place];  Service: ENT;  Laterality: Bilateral;   WISDOM TOOTH EXTRACTION       SOCIAL HISTORY: Social History   Socioeconomic History   Marital status: Married    Spouse name: Not on file   Number of  children: 1   Years of education: Not on file   Highest education level: Not on file  Occupational History   Not on file  Tobacco Use   Smoking status: Never Smoker   Smokeless tobacco: Never Used  Vaping Use   Vaping Use: Never used  Substance and Sexual Activity   Alcohol use: No   Drug use: No   Sexual activity: Not on file  Other Topics Concern   Not on file  Social History Narrative   Patient is married and has 1 son.   Patient has never smoked, used tobacco products.   Patient does not drink.  Patient does not use illicit drugs.   Social Determinants of Health   Financial Resource Strain:    Difficulty of Paying Living Expenses:   Food Insecurity:    Worried About Charity fundraiser in the Last Year:    Arboriculturist in the Last Year:   Transportation Needs:    Film/video editor (Medical):    Lack of Transportation (Non-Medical):   Physical Activity:    Days of Exercise per Week:    Minutes of Exercise per Session:   Stress:    Feeling of Stress :   Social Connections:    Frequency of Communication with Friends and Family:    Frequency of Social Gatherings with Friends and Family:    Attends Religious Services:    Active Member of Clubs or Organizations:    Attends Music therapist:    Marital Status:   Intimate Partner Violence:    Fear of Current or Ex-Partner:    Emotionally Abused:    Physically Abused:    Sexually Abused:      FAMILY HISTORY: Family History  Problem Relation Age of Onset   COPD Mother    Uterine cancer Mother    Lung cancer Brother    Lung cancer Maternal Grandfather    Stomach cancer Paternal Grandmother      ALLERGIES:   has No Known Allergies.   MEDICATIONS:  Current Outpatient Medications  Medication Sig Dispense Refill   lidocaine-prilocaine (EMLA) cream      loratadine (EQ LORATADINE) 10 MG tablet Take 10 mg by mouth daily.      ondansetron (ZOFRAN) 8 MG  tablet Take 1 tablet (8 mg total) by mouth every 8 (eight) hours as needed for nausea or vomiting. 30 tablet 0   pantoprazole (PROTONIX) 40 MG tablet Take 1 tablet (40 mg total) by mouth daily before breakfast. 30 tablet 2   prochlorperazine (COMPAZINE) 10 MG tablet Take 1 tablet (10 mg total) by mouth every 6 (six) hours as needed for nausea or vomiting. 30 tablet 0  senna-docusate (SENNA S) 8.6-50 MG tablet Take 2 tablets by mouth at bedtime as needed for mild constipation or moderate constipation. 60 tablet 1   sodium fluoride (FLUORISHIELD) 1.1 % GEL dental gel APPLY TO TOOTHBRUSH OR IN FLUORIDE TRAYS AS DIRECTED. REPEAT NIGHTLY. SPIT OUT EXCESS DO NOT SWALLOW. DO NOT RINSE AFTERWARDS     Colchicine (MITIGARE) 0.6 MG CAPS Take 2 capsules at onset of flare. Then take 1 capsule 1 hour later.  May take 1 capsule up to twice daily thereafter. (Patient not taking: Reported on 09/28/2019) 60 capsule 2   HYDROcodone-acetaminophen (HYCET) 7.5-325 mg/15 ml solution Take 15 mLs by mouth 4 (four) times daily as needed for moderate pain. (Patient not taking: Reported on 09/28/2019) 420 mL 0   promethazine (PHENERGAN) 25 MG suppository Place 1 suppository (25 mg total) rectally every 6 (six) hours as needed for nausea or vomiting. (Patient not taking: Reported on 09/28/2019) 12 suppository 1   sodium fluoride (PREVIDENT 5000 PLUS) 1.1 % CREA dental cream Apply to tooth brush or in fluoride trays as directed.  Repeat nightly. Spit out excess-DO NOT swallow. DO NOT rinse afterwards. (Patient not taking: Reported on 10/25/2019) 2 Tube prn   traZODone (DESYREL) 50 MG tablet Take 1 tablet (50 mg total) by mouth at bedtime as needed for sleep. (Patient not taking: Reported on 10/05/2019) 30 tablet 3   No current facility-administered medications for this visit.   Facility-Administered Medications Ordered in Other Visits  Medication Dose Route Frequency Provider Last Rate Last Admin   0.9 %  sodium chloride  infusion   Intravenous Continuous Monia Sabal, PA-C         REVIEW OF SYSTEMS:   A 10+ POINT REVIEW OF SYSTEMS WAS OBTAINED including neurology, dermatology, psychiatry, cardiac, respiratory, lymph, extremities, GI, GU, Musculoskeletal, constitutional, breasts, reproductive, HEENT.  All pertinent positives are noted in the HPI.  All others are negative.   PHYSICAL EXAMINATION: ECOG PERFORMANCE STATUS: 1 - Symptomatic but completely ambulatory  Vitals:   10/25/19 1240  BP: 117/80  Pulse: 80  Resp: 18  Temp: 98.1 F (36.7 C)  SpO2: 98%   Filed Weights   10/25/19 1240  Weight: 284 lb 12.8 oz (129.2 kg)   Body mass index is 43.3 kg/m.   GENERAL:alert, in no acute distress and comfortable SKIN: no acute rashes, no significant lesions EYES: conjunctiva are pink and non-injected, sclera anicteric OROPHARYNX: MMM, no exudates, no oropharyngeal erythema or ulceration NECK: supple, no JVD LYMPH:  no palpable lymphadenopathy in the cervical, axillary or inguinal regions LUNGS: clear to auscultation b/l with normal respiratory effort HEART: regular rate & rhythm ABDOMEN:  normoactive bowel sounds , non tender, not distended. No palpable hepatosplenomegaly.  Extremity: no pedal edema PSYCH: alert & oriented x 3 with fluent speech NEURO: no focal motor/sensory deficits  LABORATORY DATA:  I have reviewed the data as listed  CBC Latest Ref Rng & Units 10/25/2019 10/20/2019 10/13/2019  WBC 4.0 - 10.5 K/uL 9.8 7.0 6.1  Hemoglobin 13.0 - 17.0 g/dL 15.2 14.7 14.7  Hematocrit 39 - 52 % 45.4 44.5 44.4  Platelets 150 - 400 K/uL 258 250 221    CMP Latest Ref Rng & Units 10/25/2019 10/20/2019 10/13/2019  Glucose 70 - 99 mg/dL 84 100(H) 108(H)  BUN 6 - 20 mg/dL 19 13 13   Creatinine 0.61 - 1.24 mg/dL 1.37(H) 1.16 1.21  Sodium 135 - 145 mmol/L 136 140 138  Potassium 3.5 - 5.1 mmol/L 4.2 4.1 4.1  Chloride 98 -  111 mmol/L 98 101 102  CO2 22 - 32 mmol/L 29 26 26   Calcium 8.9 - 10.3 mg/dL 9.2 9.3  8.8(L)  Total Protein 6.5 - 8.1 g/dL - - -  Total Bilirubin 0.3 - 1.2 mg/dL - - -  Alkaline Phos 38 - 126 U/L - - -  AST 15 - 41 U/L - - -  ALT 0 - 44 U/L - - -     RADIOGRAPHIC STUDIES: I have personally reviewed the radiological images as listed and agreed with the findings in the report.  PET/CT 08/10/2019: IMPRESSION: 1. The 2.8 cm right level IIa lymph node has a maximum SUV of 28.4. A primary lesion is not readily seen. There is some asymmetric palatine tonsillar activity with the left tonsil having maximum SUV of 12.4 and the right 7.8, but no tonsillar mass is observed on the CT data. 2. No compelling findings of active malignancy in the chest, abdomen/pelvis, or skeleton. There is some accentuated anal activity which is likely physiologic given the lack of visible abnormality on the CT data. 3. Other imaging findings of potential clinical significance: Diffuse hepatic steatosis. Hypodense left renal lesion is probably a cyst but technically too small to characterize.   Electronically Signed   By: Van Clines M.D.   On: 08/10/2019 15:54   08/21/19 of Surgical Pathology [id number]    SURGICAL PATHOLOGY  * THIS IS AN AMENDED REPORT   CASE: [id number]  PATIENT: [facility]  Surgical Pathology Report   Amendment    Reason for Amendment #1: Other   Clinical History: squamous cell of neck and head (cm)     FINAL MICROSCOPIC DIAGNOSIS:   A. TONSIL, RIGHT:  - Squamous cell carcinoma, basaloid, p16 positive.  - Margins not involved.  - Carcinoma focally 1 mm from surgical margin.  - See oncology table and comment.   B. TONSIL, LEFT:  - Benign tonsil.  - No malignancy identified.   C. TONGUE, RIGHT BASE, BIOPSY:  - Benign tonsillar tissue.  - No malignancy identified.   D. TONGUE, LEFT BASE, BIOPSY:  - Benign tonsillar tissue.  - No malignancy identified.   ONCOLOGY TABLE:  PHARYNX (OROPHARYNX, HYPOPHARYNX, NASOPHARYNX):  Procedure:  Right and left tonsillectomy and right and left tongue base  biopsies.  Tumor Site: Right tonsil.  Tumor Laterality: Right tonsil.  Tumor Focality: Unifocal.  Tumor Size: 1 cm x 0.3 cm (3 mm) depth.  Histologic Type: Squamous cell carcinoma, basaloid type, p16 positive,  EBV negative.  Histologic Grade: Poorly differentiated.  Margins: Free of tumor. Carcinoma focally 1 mm from the nearest  surgical margin.    Distance to closest Margin: 1 mm.  Lymphovascular Invasion: Present.  Perineural Invasion: Not identified.  Regional Lymph Nodes: No lymph nodes submitted with this case. The  previous right neck lymph node needle core biopsy (MCS 20 [id number]) shows  squamous cell carcinoma.  Pathologic Stage Classification (pTNM, AJCC 8th Edition): pT1, pN1, see  comment.    09/11/19 of Surgical Pathology Report [id number])  COMMENT:   Metastatic carcinoma is present in 3 lymph nodes - 1 level 1 lymph node,  1 level 2 lymph node (this metastatic focus also involves skeletal  muscle), and 1 level 3 lymph node (highlighted by a cytokeratin  immunohistochemical stain). Additional studies can be performed (block  A11) upon clinician request.    ASSESSMENT & PLAN:  Cecilio Ohlrich Sr. is a 54 y.o. male with:  1. Rt Tonsilar Basaloid Squamous cell carcinoma , HPV+ve.  PT1,pN1,M0. 3/49 LN+ve High risk features - LVI, extranodal extension, high risk morphology. Significant second hand smoke exposure S/p b/l tonsillectomy on 08/21/2019 S/p rt radical neck dissection   PLAN: -Discussed pt labwork today, 10/25/19; blood counts look good, kidney numbers slightly elevated, Magnesium is WNL -The pt has no prohibitive toxicities from continuing C2 Cisplatin at this time. Will continue to monitor kidney numbers.   -Advised pt that we will continue chemotherapy throughout the duration of RT. Likely 6-7 cycles.  -Advised pt that there is increased sun sensitivity caused by chemotherapy.  Recommend pt limit sun exposure.  -Recommend pt eat smaller, more frequent meals, avoid spicy foods, and avoid drinking too much water with his main meals.  -Recommend pt continue Miralax for constipation prophylaxis -Recommend pt stay hydrated and avoid excessive amounts of meat to avoid gout flares -Will check Uric acid levels with next labs  -Rx Omeprazole/Pantoprazole, Senna w/stool softener -Continue to f/u with Dr. Isidore Moos -Will see back with C3 and labs    FOLLOW UP Please schedule next 3 cycles of Weekly Cisplatin chemotherapy with portflush and labs MD visit with C3 in 1 week and C5   The total time spent in the appt was 30 minutes and more than 50% was on counseling and direct patient cares, ordering and mx of chemotherapy  All of the patient's questions were answered with apparent satisfaction. The patient knows to call the clinic with any problems, questions or concerns.  Sullivan Lone MD [place] AAHIVMS [facility] Hematology/Oncology Physician [facility]  (Office):       [phone] (Work cell):  [phone] (Fax):           [phone]  10/25/2019 2:07 PM  I, Yevette Edwards, am acting as a scribe for Dr. Sullivan Lone.   .I have reviewed the above documentation for accuracy and completeness, and I agree with the above. Brunetta Genera MD

## 2019-10-26 ENCOUNTER — Other Ambulatory Visit: Payer: Self-pay

## 2019-10-26 ENCOUNTER — Inpatient Hospital Stay: Payer: 59 | Admitting: Nutrition

## 2019-10-26 ENCOUNTER — Inpatient Hospital Stay: Payer: 59

## 2019-10-26 ENCOUNTER — Ambulatory Visit
Admission: RE | Admit: 2019-10-26 | Discharge: 2019-10-26 | Disposition: A | Discharge status: Home / Self Care | Payer: 59 | Source: Ambulatory Visit | Attending: Radiation Oncology | Admitting: Radiation Oncology

## 2019-10-26 VITALS — BP 121/81 | HR 72 | Temp 99.1°F | Resp 20

## 2019-10-26 DIAGNOSIS — Z7189 Other specified counseling: Secondary | ICD-10-CM

## 2019-10-26 DIAGNOSIS — C090 Malignant neoplasm of tonsillar fossa: Secondary | ICD-10-CM

## 2019-10-26 DIAGNOSIS — Z510 Encounter for antineoplastic radiation therapy: Secondary | ICD-10-CM | POA: Diagnosis not present

## 2019-10-26 DIAGNOSIS — C099 Malignant neoplasm of tonsil, unspecified: Secondary | ICD-10-CM | POA: Diagnosis not present

## 2019-10-26 MED ORDER — SODIUM CHLORIDE 0.9 % IV SOLN
Freq: Once | INTRAVENOUS | Status: AC
  Filled 2019-10-26: qty 250

## 2019-10-26 MED ORDER — PALONOSETRON HCL INJECTION 0.25 MG/5ML
INTRAVENOUS | Status: AC
  Filled 2019-10-26: qty 5

## 2019-10-26 MED ORDER — POTASSIUM CHLORIDE 2 MEQ/ML IV SOLN
Freq: Once | INTRAVENOUS | Status: AC
  Filled 2019-10-26: qty 10

## 2019-10-26 MED ORDER — SODIUM CHLORIDE 0.9 % IV SOLN
39.2000 mg/m2 | Freq: Once | INTRAVENOUS | Status: AC
  Administered 2019-10-26: 100 mg via INTRAVENOUS
  Filled 2019-10-26: qty 100

## 2019-10-26 MED ORDER — SODIUM CHLORIDE 0.9 % IV SOLN
10.0000 mg | Freq: Once | INTRAVENOUS | Status: AC
  Administered 2019-10-26: 10 mg via INTRAVENOUS
  Filled 2019-10-26: qty 10

## 2019-10-26 MED ORDER — SODIUM CHLORIDE 0.9% FLUSH
10.0000 mL | INTRAVENOUS | Status: DC | PRN
  Administered 2019-10-26: 10 mL
  Filled 2019-10-26: qty 10

## 2019-10-26 MED ORDER — PALONOSETRON HCL INJECTION 0.25 MG/5ML
0.2500 mg | Freq: Once | INTRAVENOUS | Status: AC
  Administered 2019-10-26: 0.25 mg via INTRAVENOUS

## 2019-10-26 MED ORDER — HEPARIN SOD (PORK) LOCK FLUSH 100 UNIT/ML IV SOLN
500.0000 [IU] | Freq: Once | INTRAVENOUS | Status: AC | PRN
  Administered 2019-10-26: 500 [IU]
  Filled 2019-10-26: qty 5

## 2019-10-26 MED ORDER — SODIUM CHLORIDE 0.9 % IV SOLN
150.0000 mg | Freq: Once | INTRAVENOUS | Status: AC
  Administered 2019-10-26: 150 mg via INTRAVENOUS
  Filled 2019-10-26: qty 150

## 2019-10-26 NOTE — Progress Notes (Signed)
Nutrition follow-up completed with patient during infusion for squamous cell cancer of the head and neck. Weight documented as 284.8 pounds on June 16 decreased from 295 pounds June 4. Patient reports he had nausea for approximately 4 days after his first treatment.  This decreased his oral intake significantly.  He did not take his nausea medications as soon as he developed nausea.  He has prescriptions for Compazine and Zofran. He is eating Clif bars with good success. Complains of severe constipation and has started MiraLAX but is not ready to add Senokot yet.  Nutrition diagnosis: Food and nutrition related knowledge deficit continues.  Intervention: Educated patient to consume smaller more frequent meals and snacks with increased calories and protein. Reviewed strategies for improving nausea and taking nausea medications as soon as he develops nausea. Explained importance of minimizing constipation. Recommended increased fluids as tolerated. He is flushing his feeding tube and taking care of the tube site without difficulty. Patient to continue protein bars for extra calories and protein for weight maintenance.  Monitoring, evaluation, goals: Patient will tolerate increased calories and protein for weight maintenance.  Next visit: Thursday, June 24 during infusion.  **Disclaimer: This note was dictated with voice recognition software. Similar sounding words can inadvertently be transcribed and this note may contain transcription errors which may not have been corrected upon publication of note.**

## 2019-10-26 NOTE — Progress Notes (Signed)
Pt. states he had some flushing and increase heart rate with first dose of Decadron. Infused today's dose without added 10 percent and pt. tolerated well. Pharmacy notified.

## 2019-10-26 NOTE — Patient Instructions (Signed)
[facility] Discharge Instructions for Patients Receiving Chemotherapy  Today you received the following chemotherapy agent: Cisplatin  To help prevent nausea and vomiting after your treatment, we encourage you to take your nausea medication as directed by your MD.   If you develop nausea and vomiting that is not controlled by your nausea medication, call the clinic.   BELOW ARE SYMPTOMS THAT SHOULD BE REPORTED IMMEDIATELY:  *FEVER GREATER THAN 100.5 F  *CHILLS WITH OR WITHOUT FEVER  NAUSEA AND VOMITING THAT IS NOT CONTROLLED WITH YOUR NAUSEA MEDICATION  *UNUSUAL SHORTNESS OF BREATH  *UNUSUAL BRUISING OR BLEEDING  TENDERNESS IN MOUTH AND THROAT WITH OR WITHOUT PRESENCE OF ULCERS  *URINARY PROBLEMS  *BOWEL PROBLEMS  UNUSUAL RASH Items with * indicate a potential emergency and should be followed up as soon as possible.  Feel free to call the clinic should you have any questions or concerns. The clinic phone number is [phone].  Please show the CHEMO ALERT CARD at check-in to the Emergency Department and triage nurse.   

## 2019-10-27 ENCOUNTER — Other Ambulatory Visit: Payer: Self-pay

## 2019-10-27 ENCOUNTER — Ambulatory Visit
Admission: RE | Admit: 2019-10-27 | Discharge: 2019-10-27 | Disposition: A | Discharge status: Home / Self Care | Payer: 59 | Source: Ambulatory Visit | Attending: Radiation Oncology | Admitting: Radiation Oncology

## 2019-10-27 DIAGNOSIS — Z510 Encounter for antineoplastic radiation therapy: Secondary | ICD-10-CM | POA: Diagnosis not present

## 2019-10-30 ENCOUNTER — Ambulatory Visit
Admission: RE | Admit: 2019-10-30 | Discharge: 2019-10-30 | Disposition: A | Discharge status: Home / Self Care | Payer: 59 | Source: Ambulatory Visit | Attending: Radiation Oncology | Admitting: Radiation Oncology

## 2019-10-30 ENCOUNTER — Other Ambulatory Visit: Payer: Self-pay | Admitting: Radiation Oncology

## 2019-10-30 ENCOUNTER — Other Ambulatory Visit: Payer: Self-pay

## 2019-10-30 DIAGNOSIS — Z510 Encounter for antineoplastic radiation therapy: Secondary | ICD-10-CM | POA: Diagnosis not present

## 2019-10-30 DIAGNOSIS — C760 Malignant neoplasm of head, face and neck: Secondary | ICD-10-CM

## 2019-10-30 MED ORDER — LIDOCAINE VISCOUS HCL 2 % MT SOLN: OROMUCOSAL | 2 refills | Status: DC

## 2019-10-31 ENCOUNTER — Other Ambulatory Visit: Payer: Self-pay

## 2019-10-31 ENCOUNTER — Ambulatory Visit
Admission: RE | Admit: 2019-10-31 | Discharge: 2019-10-31 | Disposition: A | Discharge status: Home / Self Care | Payer: 59 | Source: Ambulatory Visit | Attending: Radiation Oncology | Admitting: Radiation Oncology

## 2019-10-31 DIAGNOSIS — Z510 Encounter for antineoplastic radiation therapy: Secondary | ICD-10-CM | POA: Diagnosis not present

## 2019-10-31 NOTE — Progress Notes (Signed)
HEMATOLOGY/ONCOLOGY CONSULTATION NOTE  Date of Service: 11/01/2019  Patient Care Team: Claretta Fraise, MD as PCP - General (Family Medicine) Jodi Marble, MD as Consulting Physician (Otolaryngology) Eppie Gibson, MD as Attending Physician (Radiation Oncology) Tish Men, MD as Consulting Physician (Hematology) Leota Sauers, RN (Inactive) as Oncology Nurse Navigator Malmfelt, Stephani Police, RN as Oncology Nurse Navigator (Oncology)  REFERRING PHYSICIAN: Claretta Fraise, MD  CHIEF COMPLAINTS/PURPOSE OF CONSULTATION:  Squamous Cell Carcinoma of the Head and Neck  HISTORY OF PRESENTING ILLNESS:   Dustin Leiter Sr. is a wonderful 54 y.o. male who has been referred to [place] by Claretta Fraise, MD for evaluation and management of Squamous Cell Carcinoma of the Head and Neck. Pt is accompanied today by [name], wife. The pt reports that he is doing well overall.   The pt reports he is fine. Pt has had both of his COVID19 vaccine. He is healing well from surgery. Some of the nerves were damaged and he has been working it. Part of his salivary gland on the right side was taken out and it burns when he starts to eat. Pt has seen swallowing evaluation today prior to coming. He is back to eating whatever he wants to now. When swallowing he has a burning sensation. Pt has a sleep apnea machine that dries out his throat. Pt also cannot open mouth completely since the dissection.  Pt had a biopsy on March 17th and found it was a squamous cell carcinoma. From his surgery he has various burning, tingling and numbness around his head, neck and shoulder.   Pt has sleep apnea. He also had gout but stopped drinking tea and has not had a flair up since the end of last year. He has no other major medical problems and works with Forensic psychologist. He has had slight hearing loss over the years due to job and being in the [employer]. Pt has never smoked, but his mother and brohter smoked a lot and pt was exposed  to second hand smoke for about 23 years. Many of the pt's family members especially from his mother side have had cancer's that were very aggressive and they passed at early ages.   Of note prior to the patient's visit today, pt has had Surgical Pathology ([id number]) completed on 09/11/19 with results revealing "LYMPH NODE, MODIFIED RADICAL NECK I-IV, REGIONAL DISSECTION: Metastatic carcinoma in 3 of 49 lymph nodes (3/49), with extracapsular extension." Pt has had Surgical Pathology ([id number]) completed on 08/21/19 with results revealing "A. TONSIL, RIGHT: Squamous cell carcinoma, basaloid, p16 positive.  Margins not involved.  Carcinoma focally 1 mm from surgical margin.  See oncology table and comment.  B. TONSIL, LEFT:  Benign tonsil.  No malignancy identified. C. TONGUE, RIGHT BASE, BIOPSY:  Benign tonsillar tissue. No malignancy identified. D. TONGUE, LEFT BASE, BIOPSY: Benign tonsillar tissue.  No malignancy identified. "  Pt has had PET Skull Base to Thigh ([id number]) completed on 08/10/19 with results revealing "1. The 2.8 cm right level IIa lymph node has a maximum SUV of 28.4. A primary lesion is not readily seen. There is some asymmetric palatine tonsillar activity with the left tonsil having maximum SUV of 12.4 and the right 7.8, but no tonsillar mass is observed on the CT data. 2. No compelling findings of active malignancy in the chest, abdomen/pelvis, or skeleton. There is some accentuated anal activity which is likely physiologic given the lack of visible abnormality on the CT data. 3. Other imaging findings of potential  clinical significance: Diffuse hepatic steatosis. Hypodense left renal lesion is probably a cyst but technically too small to characterize." Pt has had [place] Core Biopsy ([id number]) completed on  07/26/19 with results revealing "Successful ultrasound-guided right cervical lymph node/lesion Biopsy." Pt has had of CT Soft Tissue Neck ([id number]) completed on 07/13/19  with results revealing "4.1 x 3.2 cm necrotic/cystic node or nodal conglomerate at the right level II station likely reflecting nodal metastatic disease. No definite primary mass is identified within the oral cavity, pharynx or larynx. Correlate with direct visualization and consider direct tissue sampling. Additionally, PET-CT may be helpful. Cervical spondylosis as described. A prominent C6-C7 posterior disc osteophyte contributes to at least moderate bony spinal canal stenosis. Multilevel neural foraminal narrowing."  Most recent lab results (09/11/19) of CBC is as follows: all values are WNL except for Glucose at 100, Calcium at 8.7  On review of systems, pt reports various tingling, numbness, burnings sensations around head neck and shoulder, weight loss, insomnia and denies back pain, abdominal pain and any other symptoms.   On PMHx the pt reports tonsillectomy 08/21/19, radical neck dissection 09/11/19, sleep apnea, gout, wisdom tooth extraction, carpal tunnel surgery    On Social Hx the pt reports never smoked, smoked marijuana 30 years ago, works Forensic psychologist, pt was in [employer]   On Family Hx the pt reports brother metastatic lung cancer at age 66, [place] had liver cancer and pancreatic cancer in early 19's of age, mother has uterine cancer at age 73, grandmother on fathers side has stomach cancer, great grandmother had lung cancer    INTERVAL HISTORY:  Dustin Kauffman Sr. is a wonderful 54 y.o. male who is here for evaluation and management of Squamous Cell Carcinoma of the Head and Neck. He is here prior to C3 of weekly Cisplatin. The patient's last visit with [place] was on 10/25/2019. The pt reports that he is doing well overall.  The pt reports that he is starting to experience burning in his throat. Pt was given viscous Lidocaine by Dr. Isidore Moos to help with his throat discomfort. He is eating more liquid foods at this time. Pt has an appointment with Ernestene Kiel tomorrow. Pt  is not sleeping well at this time and has not been using any sleep aids. He is using his CPAP machine at night. Pt is experiencing ringing in both of his ears that is not new, but may be worse.   Lab results today (11/01/19) of CBC w/diff and BMP is as follows: all values are WNL except for Lymphs Abs at 0.6K, Glucose at 110, Creatinine at 1.26, Calcium at 8.7. 11/01/2019 Magnesium at 2.1  On review of systems, pt reports sleeplessness, throat discomfort, tinnitus and denies pain at port site and any other symptoms.   MEDICAL HISTORY:  Past Medical History:  Diagnosis Date  . Arthritis   . Change in stool 02/22/2017  . Gout 02/22/2017  . Hyperlipidemia   . Sleep apnea    Uses CPAP  . Tonsil cancer [facility])    metastatic     SURGICAL HISTORY: Past Surgical History:  Procedure Laterality Date  . CARPAL TUNNEL RELEASE Right 04/10/2019  . DIRECT LARYNGOSCOPY N/A 08/21/2019   Procedure: DIRECT LARYNGOSCOPY - WITH BX;  Surgeon: [name], MD;  Location: [facility];  Service: ENT;  Laterality: N/A;  . IR GASTROSTOMY TUBE MOD SED  10/13/2019  . IR IMAGING GUIDED PORT INSERTION  10/13/2019  . NECK DISSECTION  09/11/2019  . RADICAL NECK  DISSECTION Right 09/11/2019   Procedure: RADICAL NECK DISSECTION;  Surgeon: [name], MD;  Location: [facility];  Service: ENT;  Laterality: Right;  . TONSILLECTOMY Bilateral 08/21/2019   Procedure: TONSILLECTOMY;  Surgeon: [name], MD;  Location: [facility];  Service: ENT;  Laterality: Bilateral;  . WISDOM TOOTH EXTRACTION       SOCIAL HISTORY: Social History   Socioeconomic History  . Marital status: Married    Spouse name: Not on file  . Number of children: 1  . Years of education: Not on file  . Highest education level: Not on file  Occupational History  . Not on file  Tobacco Use  . Smoking status: Never Smoker  . Smokeless tobacco: Never Used  Vaping Use  . Vaping Use: Never used  Substance and Sexual  Activity  . Alcohol use: No  . Drug use: No  . Sexual activity: Not on file  Other Topics Concern  . Not on file  Social History Narrative   Patient is married and has 1 son.   Patient has never smoked, used tobacco products.   Patient does not drink.  Patient does not use illicit drugs.   Social Determinants of Health   Financial Resource Strain:   . Difficulty of Paying Living Expenses:   Food Insecurity:   . Worried About Charity fundraiser in the Last Year:   . Arboriculturist in the Last Year:   Transportation Needs:   . Film/video editor (Medical):   Marland Kitchen Lack of Transportation (Non-Medical):   Physical Activity:   . Days of Exercise per Week:   . Minutes of Exercise per Session:   Stress:   . Feeling of Stress :   Social Connections:   . Frequency of Communication with Friends and Family:   . Frequency of Social Gatherings with Friends and Family:   . Attends Religious Services:   . Active Member of Clubs or Organizations:   . Attends Archivist Meetings:   Marland Kitchen Marital Status:   Intimate Partner Violence:   . Fear of Current or Ex-Partner:   . Emotionally Abused:   Marland Kitchen Physically Abused:   . Sexually Abused:      FAMILY HISTORY: Family History  Problem Relation Age of Onset  . COPD Mother   . Uterine cancer Mother   . Lung cancer Brother   . Lung cancer Maternal Grandfather   . Stomach cancer Paternal Grandmother      ALLERGIES:   has No Known Allergies.   MEDICATIONS:  Current Outpatient Medications  Medication Sig Dispense Refill  . lidocaine (XYLOCAINE) 2 % solution Patient: Mix 1part 2% viscous lidocaine, 1part H20. Swish & swallow 94mL of diluted mixture, 68min before meals and at bedtime, up to QID 200 mL 2  . lidocaine-prilocaine (EMLA) cream     . loratadine (EQ LORATADINE) 10 MG tablet Take 10 mg by mouth daily.     . ondansetron (ZOFRAN) 8 MG tablet Take 1 tablet (8 mg total) by mouth every 8 (eight) hours as needed for nausea or  vomiting. 30 tablet 0  . pantoprazole (PROTONIX) 40 MG tablet Take 1 tablet (40 mg total) by mouth daily before breakfast. 30 tablet 2  . Polyethylene Glycol 3350 (MIRALAX PO) Take 17 g by mouth daily. Takes 17 g powder mixed in liquid (up to mark on cap) daily    . prochlorperazine (COMPAZINE) 10 MG tablet Take 1 tablet (10 mg total) by mouth  every 6 (six) hours as needed for nausea or vomiting. 30 tablet 0  . senna-docusate (SENNA S) 8.6-50 MG tablet Take 2 tablets by mouth at bedtime as needed for mild constipation or moderate constipation. 60 tablet 1  . sodium fluoride (FLUORISHIELD) 1.1 % GEL dental gel APPLY TO TOOTHBRUSH OR IN FLUORIDE TRAYS AS DIRECTED. REPEAT NIGHTLY. SPIT OUT EXCESS DO NOT SWALLOW. DO NOT RINSE AFTERWARDS    . Colchicine (MITIGARE) 0.6 MG CAPS Take 2 capsules at onset of flare. Then take 1 capsule 1 hour later.  May take 1 capsule up to twice daily thereafter. (Patient not taking: Reported on 09/28/2019) 60 capsule 2  . HYDROcodone-acetaminophen (HYCET) 7.5-325 mg/15 ml solution Take 15 mLs by mouth 4 (four) times daily as needed for moderate pain. (Patient not taking: Reported on 09/28/2019) 420 mL 0  . LORazepam (ATIVAN) 0.5 MG tablet Take 1-2 tablets (0.5-1 mg total) by mouth every 8 (eight) hours as needed for anxiety or sleep (chemotherapy related nausea). 30 tablet 0  . magic mouthwash w/lidocaine SOLN Take 5 mLs by mouth 4 (four) times daily as needed for mouth pain. 80 ml viscous lidocaine 2% 80 ml Mylanta 80 ml diphenhydramine 12.5 mg per 5 ml elixir 80 ml nystatin 100,000U suspension 80 ml prednisolone 15mg  per 62ml solution 80 ml distilled water Disp: 480 ml 480 mL 1  . promethazine (PHENERGAN) 25 MG suppository Place 1 suppository (25 mg total) rectally every 6 (six) hours as needed for nausea or vomiting. (Patient not taking: Reported on 09/28/2019) 12 suppository 1  . sodium fluoride (PREVIDENT 5000 PLUS) 1.1 % CREA dental cream Apply to tooth brush or in  fluoride trays as directed.  Repeat nightly. Spit out excess-DO NOT swallow. DO NOT rinse afterwards. (Patient not taking: Reported on 10/25/2019) 2 Tube prn  . traZODone (DESYREL) 50 MG tablet Take 1 tablet (50 mg total) by mouth at bedtime as needed for sleep. (Patient not taking: Reported on 10/05/2019) 30 tablet 3   No current facility-administered medications for this visit.   Facility-Administered Medications Ordered in Other Visits  Medication Dose Route Frequency Provider Last Rate Last Admin  . 0.9 %  sodium chloride infusion   Intravenous Continuous Monia Sabal, PA-C         REVIEW OF SYSTEMS:   A 10+ POINT REVIEW OF SYSTEMS WAS OBTAINED including neurology, dermatology, psychiatry, cardiac, respiratory, lymph, extremities, GI, GU, Musculoskeletal, constitutional, breasts, reproductive, HEENT.  All pertinent positives are noted in the HPI.  All others are negative.   PHYSICAL EXAMINATION: ECOG PERFORMANCE STATUS: 1 - Symptomatic but completely ambulatory  Vitals:   11/01/19 1003  BP: 124/76  Pulse: 67  Resp: 18  Temp: 97.9 F (36.6 C)  SpO2: 100%   Filed Weights   11/01/19 1003  Weight: 284 lb 8 oz (129 kg)   Body mass index is 43.26 kg/m.   GENERAL:alert, in no acute distress and comfortable SKIN: no acute rashes, no significant lesions EYES: conjunctiva are pink and non-injected, sclera anicteric OROPHARYNX: MMM, no exudates, no oropharyngeal erythema or ulceration NECK: supple, no JVD LYMPH:  no palpable lymphadenopathy in the cervical, axillary or inguinal regions LUNGS: clear to auscultation b/l with normal respiratory effort HEART: regular rate & rhythm ABDOMEN:  normoactive bowel sounds , non tender, not distended. No palpable hepatosplenomegaly.  Extremity: 1+ pedal edema PSYCH: alert & oriented x 3 with fluent speech NEURO: no focal motor/sensory deficits  LABORATORY DATA:  I have reviewed the data as listed  CBC Latest Ref Rng & Units 11/01/2019  10/25/2019 10/20/2019  WBC 4.0 - 10.5 K/uL 5.6 9.8 7.0  Hemoglobin 13.0 - 17.0 g/dL 13.3 15.2 14.7  Hematocrit 39 - 52 % 40.0 45.4 44.5  Platelets 150 - 400 K/uL 175 258 250    CMP Latest Ref Rng & Units 11/01/2019 10/25/2019 10/20/2019  Glucose 70 - 99 mg/dL 110(H) 84 100(H)  BUN 6 - 20 mg/dL 16 19 13   Creatinine 0.61 - 1.24 mg/dL 1.26(H) 1.37(H) 1.16  Sodium 135 - 145 mmol/L 138 136 140  Potassium 3.5 - 5.1 mmol/L 4.2 4.2 4.1  Chloride 98 - 111 mmol/L 103 98 101  CO2 22 - 32 mmol/L 29 29 26   Calcium 8.9 - 10.3 mg/dL 8.7(L) 9.2 9.3  Total Protein 6.5 - 8.1 g/dL - - -  Total Bilirubin 0.3 - 1.2 mg/dL - - -  Alkaline Phos 38 - 126 U/L - - -  AST 15 - 41 U/L - - -  ALT 0 - 44 U/L - - -     RADIOGRAPHIC STUDIES: I have personally reviewed the radiological images as listed and agreed with the findings in the report.  PET/CT 08/10/2019: IMPRESSION: 1. The 2.8 cm right level IIa lymph node has a maximum SUV of 28.4. A primary lesion is not readily seen. There is some asymmetric palatine tonsillar activity with the left tonsil having maximum SUV of 12.4 and the right 7.8, but no tonsillar mass is observed on the CT data. 2. No compelling findings of active malignancy in the chest, abdomen/pelvis, or skeleton. There is some accentuated anal activity which is likely physiologic given the lack of visible abnormality on the CT data. 3. Other imaging findings of potential clinical significance: Diffuse hepatic steatosis. Hypodense left renal lesion is probably a cyst but technically too small to characterize.   Electronically Signed   By: Van Clines M.D.   On: 08/10/2019 15:54   08/21/19 of Surgical Pathology [id number]    SURGICAL PATHOLOGY  * THIS IS AN AMENDED REPORT   CASE: [id number]  PATIENT: [facility]  Surgical Pathology Report   Amendment    Reason for Amendment #1: Other   Clinical History: squamous cell of neck and head (cm)     FINAL  MICROSCOPIC DIAGNOSIS:   A. TONSIL, RIGHT:  - Squamous cell carcinoma, basaloid, p16 positive.  - Margins not involved.  - Carcinoma focally 1 mm from surgical margin.  - See oncology table and comment.   B. TONSIL, LEFT:  - Benign tonsil.  - No malignancy identified.   C. TONGUE, RIGHT BASE, BIOPSY:  - Benign tonsillar tissue.  - No malignancy identified.   D. TONGUE, LEFT BASE, BIOPSY:  - Benign tonsillar tissue.  - No malignancy identified.   ONCOLOGY TABLE:  PHARYNX (OROPHARYNX, HYPOPHARYNX, NASOPHARYNX):  Procedure: Right and left tonsillectomy and right and left tongue base  biopsies.  Tumor Site: Right tonsil.  Tumor Laterality: Right tonsil.  Tumor Focality: Unifocal.  Tumor Size: 1 cm x 0.3 cm (3 mm) depth.  Histologic Type: Squamous cell carcinoma, basaloid type, p16 positive,  EBV negative.  Histologic Grade: Poorly differentiated.  Margins: Free of tumor. Carcinoma focally 1 mm from the nearest  surgical margin.    Distance to closest Margin: 1 mm.  Lymphovascular Invasion: Present.  Perineural Invasion: Not identified.  Regional Lymph Nodes: No lymph nodes submitted with this case. The  previous right neck lymph node needle core biopsy (MCS 20 [phone]) shows  squamous  cell carcinoma.  Pathologic Stage Classification (pTNM, AJCC 8th Edition): pT1, pN1, see  comment.    09/11/19 of Surgical Pathology Report [id number])  COMMENT:   Metastatic carcinoma is present in 3 lymph nodes - 1 level 1 lymph node,  1 level 2 lymph node (this metastatic focus also involves skeletal  muscle), and 1 level 3 lymph node (highlighted by a cytokeratin  immunohistochemical stain). Additional studies can be performed (block  A11) upon clinician request.    ASSESSMENT & PLAN:   Alekxander Isola Sr. is a 54 y.o. male with:  1. Rt Tonsilar Basaloid Squamous cell carcinoma , HPV+ve. PT1,pN1,M0. 3/49 LN+ve High risk features - LVI, extranodal extension, high risk  morphology. Significant second hand smoke exposure S/p b/l tonsillectomy on 08/21/2019 S/p rt radical neck dissection   PLAN: -Discussed pt labwork today, 11/01/19; blood counts and chemistries are stable, some element of dehydration, Magnesium is WNL -The pt has no prohibitive toxicities from continuing C3 weekly Cisplatin at this time. -Recommended that the pt continue to eat well, drink at least 48-64 oz of water each day, and walk 20-30 minutes each day.  -Recommend pt use two Trazodone at night to improve sleeplessness  -Rx Lorazepam to use prn, Magic Mouthwash -Recommend pt f/u with Ernestene Kiel as scheduled -Continue to f/u with Dr. Isidore Moos -Will see back in 1 week with labs    FOLLOW UP: Please schedule next 3 cycles of weekly Cisplatin with portflush, labs and MD visits   The total time spent in the appt was 30 minutes and more than 50% was on counseling and direct patient cares, ordering, toxicity assessment and mx of Cisplatin chemotherapy.  All of the patient's questions were answered with apparent satisfaction. The patient knows to call the clinic with any problems, questions or concerns.   Sullivan Lone MD [place] AAHIVMS [facility] Hematology/Oncology Physician [facility]  (Office):       [phone] (Work cell):  [phone] (Fax):           [phone]  11/01/2019 10:53 AM  I, Yevette Edwards, am acting as a scribe for Dr. Sullivan Lone.   .I have reviewed the above documentation for accuracy and completeness, and I agree with the above. Brunetta Genera MD

## 2019-11-01 ENCOUNTER — Inpatient Hospital Stay (HOSPITAL_BASED_OUTPATIENT_CLINIC_OR_DEPARTMENT_OTHER): Payer: 59 | Admitting: Hematology

## 2019-11-01 ENCOUNTER — Inpatient Hospital Stay: Payer: 59

## 2019-11-01 ENCOUNTER — Other Ambulatory Visit: Payer: Self-pay

## 2019-11-01 ENCOUNTER — Ambulatory Visit
Admission: RE | Admit: 2019-11-01 | Discharge: 2019-11-01 | Disposition: A | Discharge status: Home / Self Care | Payer: 59 | Source: Ambulatory Visit | Attending: Radiation Oncology | Admitting: Radiation Oncology

## 2019-11-01 VITALS — BP 124/76 | HR 67 | Temp 97.9°F | Resp 18 | Ht 68.0 in | Wt 284.5 lb

## 2019-11-01 DIAGNOSIS — C099 Malignant neoplasm of tonsil, unspecified: Secondary | ICD-10-CM | POA: Diagnosis not present

## 2019-11-01 DIAGNOSIS — Z510 Encounter for antineoplastic radiation therapy: Secondary | ICD-10-CM | POA: Diagnosis not present

## 2019-11-01 DIAGNOSIS — Z5111 Encounter for antineoplastic chemotherapy: Secondary | ICD-10-CM

## 2019-11-01 DIAGNOSIS — G4700 Insomnia, unspecified: Secondary | ICD-10-CM | POA: Diagnosis not present

## 2019-11-01 DIAGNOSIS — Z7189 Other specified counseling: Secondary | ICD-10-CM

## 2019-11-01 DIAGNOSIS — K1233 Oral mucositis (ulcerative) due to radiation: Secondary | ICD-10-CM

## 2019-11-01 DIAGNOSIS — C090 Malignant neoplasm of tonsillar fossa: Secondary | ICD-10-CM

## 2019-11-01 LAB — CBC WITH DIFFERENTIAL/PLATELET
Abs Immature Granulocytes: 0.01 10*3/uL (ref 0.00–0.07)
Basophils Absolute: 0 10*3/uL (ref 0.0–0.1)
Basophils Relative: 0 %
Eosinophils Absolute: 0.1 10*3/uL (ref 0.0–0.5)
Eosinophils Relative: 2 %
HCT: 40 % (ref 39.0–52.0)
Hemoglobin: 13.3 g/dL (ref 13.0–17.0)
Immature Granulocytes: 0 %
Lymphocytes Relative: 11 %
Lymphs Abs: 0.6 10*3/uL — ABNORMAL LOW (ref 0.7–4.0)
MCH: 29.6 pg (ref 26.0–34.0)
MCHC: 33.3 g/dL (ref 30.0–36.0)
MCV: 89.1 fL (ref 80.0–100.0)
Monocytes Absolute: 0.5 10*3/uL (ref 0.1–1.0)
Monocytes Relative: 9 %
Neutro Abs: 4.4 10*3/uL (ref 1.7–7.7)
Neutrophils Relative %: 78 %
Platelets: 175 10*3/uL (ref 150–400)
RBC: 4.49 MIL/uL (ref 4.22–5.81)
RDW: 13.5 % (ref 11.5–15.5)
WBC: 5.6 10*3/uL (ref 4.0–10.5)
nRBC: 0 % (ref 0.0–0.2)

## 2019-11-01 LAB — BASIC METABOLIC PANEL
Anion gap: 6 (ref 5–15)
BUN: 16 mg/dL (ref 6–20)
CO2: 29 mmol/L (ref 22–32)
Calcium: 8.7 mg/dL — ABNORMAL LOW (ref 8.9–10.3)
Chloride: 103 mmol/L (ref 98–111)
Creatinine, Ser: 1.26 mg/dL — ABNORMAL HIGH (ref 0.61–1.24)
GFR calc Af Amer: >60 mL/min (ref >60)
GFR calc non Af Amer: >60 mL/min (ref >60)
Glucose, Bld: 110 mg/dL — ABNORMAL HIGH (ref 70–99)
Potassium: 4.2 mmol/L (ref 3.5–5.1)
Sodium: 138 mmol/L (ref 135–145)

## 2019-11-01 LAB — MAGNESIUM: Magnesium: 2.1 mg/dL (ref 1.7–2.4)

## 2019-11-01 MED ORDER — LORAZEPAM 0.5 MG PO TABS: 0.5000 mg | ORAL_TABLET | Freq: Three times a day (TID) | ORAL | 0 refills | Status: DC | PRN

## 2019-11-01 MED ORDER — MAGIC MOUTHWASH W/LIDOCAINE
5.0000 mL | Freq: Four times a day (QID) | ORAL | 1 refills | Status: DC | PRN
Start: 2019-11-01 — End: 2019-11-09

## 2019-11-01 NOTE — Patient Instructions (Signed)

## 2019-11-02 ENCOUNTER — Ambulatory Visit
Admission: RE | Admit: 2019-11-02 | Discharge: 2019-11-02 | Disposition: A | Discharge status: Home / Self Care | Payer: 59 | Source: Ambulatory Visit | Attending: Radiation Oncology | Admitting: Radiation Oncology

## 2019-11-02 ENCOUNTER — Encounter: Payer: Self-pay | Admitting: Hematology

## 2019-11-02 ENCOUNTER — Other Ambulatory Visit: Payer: Self-pay

## 2019-11-02 ENCOUNTER — Inpatient Hospital Stay: Payer: 59

## 2019-11-02 ENCOUNTER — Inpatient Hospital Stay: Payer: 59 | Admitting: Nutrition

## 2019-11-02 VITALS — BP 125/77 | HR 68 | Temp 98.8°F | Resp 18

## 2019-11-02 DIAGNOSIS — C099 Malignant neoplasm of tonsil, unspecified: Secondary | ICD-10-CM | POA: Diagnosis not present

## 2019-11-02 DIAGNOSIS — Z7189 Other specified counseling: Secondary | ICD-10-CM

## 2019-11-02 DIAGNOSIS — Z510 Encounter for antineoplastic radiation therapy: Secondary | ICD-10-CM | POA: Diagnosis not present

## 2019-11-02 DIAGNOSIS — C090 Malignant neoplasm of tonsillar fossa: Secondary | ICD-10-CM

## 2019-11-02 MED ORDER — SODIUM CHLORIDE 0.9% FLUSH
10.0000 mL | INTRAVENOUS | Status: DC | PRN
  Administered 2019-11-02: 10 mL
  Filled 2019-11-02: qty 10

## 2019-11-02 MED ORDER — PALONOSETRON HCL INJECTION 0.25 MG/5ML
0.2500 mg | Freq: Once | INTRAVENOUS | Status: AC
  Administered 2019-11-02: 0.25 mg via INTRAVENOUS

## 2019-11-02 MED ORDER — SODIUM CHLORIDE 0.9 % IV SOLN
10.0000 mg | Freq: Once | INTRAVENOUS | Status: AC
  Administered 2019-11-02: 10 mg via INTRAVENOUS
  Filled 2019-11-02 (×2): qty 1

## 2019-11-02 MED ORDER — POTASSIUM CHLORIDE 2 MEQ/ML IV SOLN
Freq: Once | INTRAVENOUS | Status: AC
  Filled 2019-11-02: qty 10

## 2019-11-02 MED ORDER — HEPARIN SOD (PORK) LOCK FLUSH 100 UNIT/ML IV SOLN
500.0000 [IU] | Freq: Once | INTRAVENOUS | Status: AC | PRN
  Administered 2019-11-02: 500 [IU]
  Filled 2019-11-02: qty 5

## 2019-11-02 MED ORDER — PALONOSETRON HCL INJECTION 0.25 MG/5ML
INTRAVENOUS | Status: AC
  Filled 2019-11-02: qty 5

## 2019-11-02 MED ORDER — SODIUM CHLORIDE 0.9 % IV SOLN
150.0000 mg | Freq: Once | INTRAVENOUS | Status: AC
  Administered 2019-11-02: 150 mg via INTRAVENOUS
  Filled 2019-11-02: qty 150

## 2019-11-02 MED ORDER — SODIUM CHLORIDE 0.9 % IV SOLN
39.2000 mg/m2 | Freq: Once | INTRAVENOUS | Status: AC
  Administered 2019-11-02: 100 mg via INTRAVENOUS
  Filled 2019-11-02: qty 100

## 2019-11-02 MED ORDER — SODIUM CHLORIDE 0.9 % IV SOLN
Freq: Once | INTRAVENOUS | Status: AC
  Filled 2019-11-02: qty 250

## 2019-11-02 NOTE — Progress Notes (Signed)
Nutrition follow-up completed with patient during infusion for squamous cell cancer of the head and neck. Weight was documented as 284.5 pounds on June 23 stable from 284.8 pounds on June 16. Patient reports nausea was much improved this past cycle. Reports it is getting more difficult to swallow and his oral intake has decreased secondary to throat soreness/pain.  He received a prescription for lidocaine but has not used it yet. Reports tolerating soup, eggs, oatmeal, and juices. Reports mild constipation.  He uses MiraLAX mixed with prune juice and apple juice.  He is going to increase to twice daily. Patient has feeding tube but has not had to use it yet. He is willing to start drinking chocolate boost plus or Ensure Plus to supplement oral intake. Noted magnesium 2.1, glucose 110, creatinine 1.26.  Nutrition diagnosis: Food and nutrition related knowledge deficit continues.  Intervention: Educated patient to begin 1 bottle Ensure Plus or boost plus daily by mouth as needed.  Provided samples and coupons. Educated patient to begin using 1 bottle Osmolite 1.5 via feeding tube with 60 mL of free water before and after.  Assess tolerance.  Will determine when tube feedings need to be increased to minimize weight loss. Continue oral intake to promote weight maintenance. Continue flushing feeding tube with water on a daily basis and caring for site.   Questions answered.  Teach back method used.  Monitoring, evaluation, goals: Patient will tolerate increased calories and protein for weight maintenance.  Next visit: Friday, July 2 during infusion.  **Disclaimer: This note was dictated with voice recognition software. Similar sounding words can inadvertently be transcribed and this note may contain transcription errors which may not have been corrected upon publication of note.**

## 2019-11-02 NOTE — Patient Instructions (Signed)
[facility] Discharge Instructions for Patients Receiving Chemotherapy  Today you received the following chemotherapy agent: Cisplatin  To help prevent nausea and vomiting after your treatment, we encourage you to take your nausea medication as directed by your MD.   If you develop nausea and vomiting that is not controlled by your nausea medication, call the clinic.   BELOW ARE SYMPTOMS THAT SHOULD BE REPORTED IMMEDIATELY:  *FEVER GREATER THAN 100.5 F  *CHILLS WITH OR WITHOUT FEVER  NAUSEA AND VOMITING THAT IS NOT CONTROLLED WITH YOUR NAUSEA MEDICATION  *UNUSUAL SHORTNESS OF BREATH  *UNUSUAL BRUISING OR BLEEDING  TENDERNESS IN MOUTH AND THROAT WITH OR WITHOUT PRESENCE OF ULCERS  *URINARY PROBLEMS  *BOWEL PROBLEMS  UNUSUAL RASH Items with * indicate a potential emergency and should be followed up as soon as possible.  Feel free to call the clinic should you have any questions or concerns. The clinic phone number is [phone].  Please show the CHEMO ALERT CARD at check-in to the Emergency Department and triage nurse.   

## 2019-11-03 ENCOUNTER — Inpatient Hospital Stay: Payer: 59

## 2019-11-03 ENCOUNTER — Other Ambulatory Visit: Payer: Self-pay

## 2019-11-03 ENCOUNTER — Ambulatory Visit
Admission: RE | Admit: 2019-11-03 | Discharge: 2019-11-03 | Disposition: A | Discharge status: Home / Self Care | Payer: 59 | Source: Ambulatory Visit | Attending: Radiation Oncology | Admitting: Radiation Oncology

## 2019-11-03 ENCOUNTER — Other Ambulatory Visit: Payer: Self-pay | Admitting: Hematology

## 2019-11-03 ENCOUNTER — Encounter: Payer: 59 | Admitting: Nutrition

## 2019-11-03 ENCOUNTER — Telehealth: Payer: Self-pay | Admitting: *Deleted

## 2019-11-03 ENCOUNTER — Inpatient Hospital Stay (HOSPITAL_BASED_OUTPATIENT_CLINIC_OR_DEPARTMENT_OTHER): Payer: 59 | Admitting: Medical

## 2019-11-03 DIAGNOSIS — C090 Malignant neoplasm of tonsillar fossa: Secondary | ICD-10-CM

## 2019-11-03 DIAGNOSIS — R319 Hematuria, unspecified: Secondary | ICD-10-CM

## 2019-11-03 DIAGNOSIS — R310 Gross hematuria: Secondary | ICD-10-CM

## 2019-11-03 DIAGNOSIS — Z510 Encounter for antineoplastic radiation therapy: Secondary | ICD-10-CM | POA: Diagnosis not present

## 2019-11-03 DIAGNOSIS — C099 Malignant neoplasm of tonsil, unspecified: Secondary | ICD-10-CM | POA: Diagnosis not present

## 2019-11-03 LAB — URINALYSIS, COMPLETE (UACMP) WITH MICROSCOPIC
Bilirubin Urine: NEGATIVE
Glucose, UA: NEGATIVE mg/dL
Ketones, ur: NEGATIVE mg/dL
Leukocytes,Ua: NEGATIVE
Nitrite: NEGATIVE
Protein, ur: NEGATIVE mg/dL
RBC / HPF: >50 RBC/hpf — ABNORMAL HIGH (ref 0–5)
Specific Gravity, Urine: 1.024 (ref 1.005–1.030)
pH: 5 (ref 5.0–8.0)

## 2019-11-03 LAB — CBC WITH DIFFERENTIAL (CANCER CENTER ONLY)
Abs Immature Granulocytes: 0.04 10*3/uL (ref 0.00–0.07)
Basophils Absolute: 0 10*3/uL (ref 0.0–0.1)
Basophils Relative: 0 %
Eosinophils Absolute: 0 10*3/uL (ref 0.0–0.5)
Eosinophils Relative: 0 %
HCT: 40 % (ref 39.0–52.0)
Hemoglobin: 13.6 g/dL (ref 13.0–17.0)
Immature Granulocytes: 0 %
Lymphocytes Relative: 5 %
Lymphs Abs: 0.5 10*3/uL — ABNORMAL LOW (ref 0.7–4.0)
MCH: 30.4 pg (ref 26.0–34.0)
MCHC: 34 g/dL (ref 30.0–36.0)
MCV: 89.3 fL (ref 80.0–100.0)
Monocytes Absolute: 0.7 10*3/uL (ref 0.1–1.0)
Monocytes Relative: 7 %
Neutro Abs: 9.3 10*3/uL — ABNORMAL HIGH (ref 1.7–7.7)
Neutrophils Relative %: 88 %
Platelet Count: 183 10*3/uL (ref 150–400)
RBC: 4.48 MIL/uL (ref 4.22–5.81)
RDW: 13.1 % (ref 11.5–15.5)
WBC Count: 10.6 10*3/uL — ABNORMAL HIGH (ref 4.0–10.5)
nRBC: 0 % (ref 0.0–0.2)

## 2019-11-03 LAB — CMP (CANCER CENTER ONLY)
ALT: 20 U/L (ref 0–44)
AST: 14 U/L — ABNORMAL LOW (ref 15–41)
Albumin: 3.5 g/dL (ref 3.5–5.0)
Alkaline Phosphatase: 69 U/L (ref 38–126)
Anion gap: 11 (ref 5–15)
BUN: 22 mg/dL — ABNORMAL HIGH (ref 6–20)
CO2: 24 mmol/L (ref 22–32)
Calcium: 8.7 mg/dL — ABNORMAL LOW (ref 8.9–10.3)
Chloride: 102 mmol/L (ref 98–111)
Creatinine: 1.17 mg/dL (ref 0.61–1.24)
GFR, Est AFR Am: >60 mL/min (ref >60)
GFR, Estimated: >60 mL/min (ref >60)
Glucose, Bld: 96 mg/dL (ref 70–99)
Potassium: 4.3 mmol/L (ref 3.5–5.1)
Sodium: 137 mmol/L (ref 135–145)
Total Bilirubin: 0.4 mg/dL (ref 0.3–1.2)
Total Protein: 6.7 g/dL (ref 6.5–8.1)

## 2019-11-03 MED ORDER — HYDROCODONE-ACETAMINOPHEN 5-325 MG PO TABS: 1.0000 | ORAL_TABLET | Freq: Four times a day (QID) | ORAL | 0 refills | Status: DC | PRN

## 2019-11-03 MED ORDER — SODIUM CHLORIDE 0.9% FLUSH
10.0000 mL | INTRAVENOUS | Status: AC | PRN
  Administered 2019-11-03: 10 mL via INTRAVENOUS
  Filled 2019-11-03: qty 10

## 2019-11-03 MED ORDER — TAMSULOSIN HCL 0.4 MG PO CAPS: 0.4000 mg | ORAL_CAPSULE | Freq: Every day | ORAL | 0 refills | Status: DC

## 2019-11-03 NOTE — Telephone Encounter (Signed)
Dr. Irene Limbo requested the following in esponse to MyChart Message: U/A and urine culture ordered. Patient to be seen in [facility] today. Patient waiting in lobby following AM radiation. Patient informed of lab appt and appt with [facility]. Patient in agreement.

## 2019-11-04 LAB — URINE CULTURE: Culture: NO GROWTH

## 2019-11-06 ENCOUNTER — Ambulatory Visit
Admission: RE | Admit: 2019-11-06 | Discharge: 2019-11-06 | Disposition: A | Discharge status: Home / Self Care | Payer: 59 | Source: Ambulatory Visit | Attending: Radiation Oncology | Admitting: Radiation Oncology

## 2019-11-06 ENCOUNTER — Other Ambulatory Visit: Payer: Self-pay

## 2019-11-06 DIAGNOSIS — Z510 Encounter for antineoplastic radiation therapy: Secondary | ICD-10-CM | POA: Diagnosis not present

## 2019-11-06 NOTE — Progress Notes (Signed)
Pharmacist Chemotherapy Monitoring - Follow Up Assessment    I verify that I have reviewed each item in the below checklist:  . Regimen for the patient is scheduled for the appropriate day and plan matches scheduled date. Marland Kitchen Appropriate non-routine labs are ordered dependent on drug ordered. . If applicable, additional medications reviewed and ordered per protocol based on lifetime cumulative doses and/or treatment regimen.   Plan for follow-up and/or issues identified: No . I-vent associated with next due treatment: No . MD and/or nursing notified: No  Philomena Course 11/06/2019 3:16 PM

## 2019-11-06 NOTE — Progress Notes (Signed)
The patient was seen in the front of the office after he presented and reported that he has been having gross hematuria.  He is managed by Dr. Irene Limbo and is currently status post cycle 3, day 1 of cisplatin which was dosed on 11/02/2019.  He is also being treated with concurrent radiation therapy for a squamous cell carcinoma of the right tonsil.  His labs from today are as noted below.  He denies any dysuria or flank pain.  He denies fevers, chills, or sweats.  CBC    Component Value Date/Time   WBC 10.6 (H) 11/03/2019 1120   WBC 5.6 11/01/2019 0932   RBC 4.48 11/03/2019 1120   HGB 13.6 11/03/2019 1120   HGB 15.5 04/03/2019 1138   HCT 40.0 11/03/2019 1120   HCT 45.4 04/03/2019 1138   PLT 183 11/03/2019 1120   PLT 253 04/03/2019 1138   MCV 89.3 11/03/2019 1120   MCV 90 04/03/2019 1138   MCH 30.4 11/03/2019 1120   MCHC 34.0 11/03/2019 1120   RDW 13.1 11/03/2019 1120   RDW 13.1 04/03/2019 1138   LYMPHSABS 0.5 (L) 11/03/2019 1120   MONOABS 0.7 11/03/2019 1120   EOSABS 0.0 11/03/2019 1120   BASOSABS 0.0 11/03/2019 1120   CMP Latest Ref Rng & Units 11/03/2019 11/01/2019 10/25/2019  Glucose 70 - 99 mg/dL 96 110(H) 84  BUN 6 - 20 mg/dL 22(H) 16 19  Creatinine 0.61 - 1.24 mg/dL 1.17 1.26(H) 1.37(H)  Sodium 135 - 145 mmol/L 137 138 136  Potassium 3.5 - 5.1 mmol/L 4.3 4.2 4.2  Chloride 98 - 111 mmol/L 102 103 98  CO2 22 - 32 mmol/L 24 29 29   Calcium 8.9 - 10.3 mg/dL 8.7(L) 8.7(L) 9.2  Total Protein 6.5 - 8.1 g/dL 6.7 - -  Total Bilirubin 0.3 - 1.2 mg/dL 0.4 - -  Alkaline Phos 38 - 126 U/L 69 - -  AST 15 - 41 U/L 14(L) - -  ALT 0 - 44 U/L 20 - -    The patient's labs were reviewed with Dr. Irene Limbo.  He will be referred to urology for evaluation.  He was told to push fluids and was told to present to the emergency room should he develop anuria.  Patient expresses understanding and agreement with this plan.  Sandi Mealy, MHS, PA-C Physician Assistant

## 2019-11-07 ENCOUNTER — Encounter: Payer: 59 | Admitting: Nutrition

## 2019-11-07 ENCOUNTER — Other Ambulatory Visit: Payer: Self-pay

## 2019-11-07 ENCOUNTER — Ambulatory Visit
Admission: RE | Admit: 2019-11-07 | Discharge: 2019-11-07 | Disposition: A | Discharge status: Home / Self Care | Payer: 59 | Source: Ambulatory Visit | Attending: Radiation Oncology | Admitting: Radiation Oncology

## 2019-11-07 DIAGNOSIS — Z510 Encounter for antineoplastic radiation therapy: Secondary | ICD-10-CM | POA: Diagnosis not present

## 2019-11-08 ENCOUNTER — Ambulatory Visit
Admission: RE | Admit: 2019-11-08 | Discharge: 2019-11-08 | Disposition: A | Discharge status: Home / Self Care | Payer: 59 | Source: Ambulatory Visit | Attending: Radiation Oncology | Admitting: Radiation Oncology

## 2019-11-08 ENCOUNTER — Other Ambulatory Visit: Payer: Self-pay

## 2019-11-08 DIAGNOSIS — Z510 Encounter for antineoplastic radiation therapy: Secondary | ICD-10-CM | POA: Diagnosis not present

## 2019-11-08 NOTE — Progress Notes (Signed)
HEMATOLOGY/ONCOLOGY CLINIC NOTE  Date of Service: 11/09/2019  Patient Care Team: Claretta Fraise, MD as PCP - General (Family Medicine) Jodi Marble, MD as Consulting Physician (Otolaryngology) Eppie Gibson, MD as Attending Physician (Radiation Oncology) Tish Men, MD as Consulting Physician (Hematology) Leota Sauers, RN (Inactive) as Oncology Nurse Navigator Malmfelt, Stephani Police, RN as Oncology Nurse Navigator (Oncology)  REFERRING PHYSICIAN: Claretta Fraise, MD  CHIEF COMPLAINTS/PURPOSE OF CONSULTATION:  Squamous Cell Carcinoma of the Head and Neck  HISTORY OF PRESENTING ILLNESS:   Dustin Leiter Sr. is a wonderful 54 y.o. male who has been referred to [place] by Claretta Fraise, MD for evaluation and management of Squamous Cell Carcinoma of the Head and Neck. Pt is accompanied today by [name], wife. The pt reports that he is doing well overall.   The pt reports he is fine. Pt has had both of his COVID19 vaccine. He is healing well from surgery. Some of the nerves were damaged and he has been working it. Part of his salivary gland on the right side was taken out and it burns when he starts to eat. Pt has seen swallowing evaluation today prior to coming. He is back to eating whatever he wants to now. When swallowing he has a burning sensation. Pt has a sleep apnea machine that dries out his throat. Pt also cannot open mouth completely since the dissection.  Pt had a biopsy on March 17th and found it was a squamous cell carcinoma. From his surgery he has various burning, tingling and numbness around his head, neck and shoulder.   Pt has sleep apnea. He also had gout but stopped drinking tea and has not had a flair up since the end of last year. He has no other major medical problems and works with Forensic psychologist. He has had slight hearing loss over the years due to job and being in the [employer]. Pt has never smoked, but his mother and brohter smoked a lot and pt was exposed to  second hand smoke for about 23 years. Many of the pt's family members especially from his mother side have had cancer's that were very aggressive and they passed at early ages.   Of note prior to the patient's visit today, pt has had Surgical Pathology ([id number]) completed on 09/11/19 with results revealing "LYMPH NODE, MODIFIED RADICAL NECK I-IV, REGIONAL DISSECTION: Metastatic carcinoma in 3 of 49 lymph nodes (3/49), with extracapsular extension." Pt has had Surgical Pathology ([id number]) completed on 08/21/19 with results revealing "A. TONSIL, RIGHT: Squamous cell carcinoma, basaloid, p16 positive.  Margins not involved.  Carcinoma focally 1 mm from surgical margin.  See oncology table and comment.  B. TONSIL, LEFT:  Benign tonsil.  No malignancy identified. C. TONGUE, RIGHT BASE, BIOPSY:  Benign tonsillar tissue. No malignancy identified. D. TONGUE, LEFT BASE, BIOPSY: Benign tonsillar tissue.  No malignancy identified. "  Pt has had PET Skull Base to Thigh ([id number]) completed on 08/10/19 with results revealing "1. The 2.8 cm right level IIa lymph node has a maximum SUV of 28.4. A primary lesion is not readily seen. There is some asymmetric palatine tonsillar activity with the left tonsil having maximum SUV of 12.4 and the right 7.8, but no tonsillar mass is observed on the CT data. 2. No compelling findings of active malignancy in the chest, abdomen/pelvis, or skeleton. There is some accentuated anal activity which is likely physiologic given the lack of visible abnormality on the CT data. 3. Other imaging findings of potential  clinical significance: Diffuse hepatic steatosis. Hypodense left renal lesion is probably a cyst but technically too small to characterize." Pt has had [place] Core Biopsy ([id number]) completed on  07/26/19 with results revealing "Successful ultrasound-guided right cervical lymph node/lesion Biopsy." Pt has had of CT Soft Tissue Neck ([id number]) completed on 07/13/19 with  results revealing "4.1 x 3.2 cm necrotic/cystic node or nodal conglomerate at the right level II station likely reflecting nodal metastatic disease. No definite primary mass is identified within the oral cavity, pharynx or larynx. Correlate with direct visualization and consider direct tissue sampling. Additionally, PET-CT may be helpful. Cervical spondylosis as described. A prominent C6-C7 posterior disc osteophyte contributes to at least moderate bony spinal canal stenosis. Multilevel neural foraminal narrowing."  Most recent lab results (09/11/19) of CBC is as follows: all values are WNL except for Glucose at 100, Calcium at 8.7  On review of systems, pt reports various tingling, numbness, burnings sensations around head neck and shoulder, weight loss, insomnia and denies back pain, abdominal pain and any other symptoms.   On PMHx the pt reports tonsillectomy 08/21/19, radical neck dissection 09/11/19, sleep apnea, gout, wisdom tooth extraction, carpal tunnel surgery    On Social Hx the pt reports never smoked, smoked marijuana 30 years ago, works Forensic psychologist, pt was in [employer]   On Family Hx the pt reports brother metastatic lung cancer at age 41, [place] had liver cancer and pancreatic cancer in early 79's of age, mother has uterine cancer at age 47, grandmother on fathers side has stomach cancer, great grandmother had lung cancer    INTERVAL HISTORY:  Dustin Astorga Sr. is a wonderful 54 y.o. male who is here for evaluation and management of Squamous Cell Carcinoma of the Head and Neck. He is here prior to C4 of weekly Cisplatin. Pt is joined by his wife. The patient's last visit with [place] was on 11/03/19. The pt reports that he is doing well overall.  The pt reports he is good. Pt was not able to get the Magic Mouthwash but Dr. Isidore Moos gave him another type of mouthwashes. He is still being able to eat well orally. Pt has a gauge reflex with eating. He has not used any of his pain  medication. Pt is sleeping well. He is not taking Trazodone but is only getting 4-5 hours a night. His last day of radiation is on July 23rd. Pt is hearing ringing in the ears intermitently but it is stable. There is no specific pattern to when it gets worse. There was a burning feeling while urinating and blood in the urine but it has cleared up. He is having slightly blurry vision. Pt sometimes he gets a cramp where the feeding tube was placed.   Lab results today (11/09/19) of CBC w/diff and CMP is as follows: all values are WNL except for WBC at 3.9K, HCT at 38.9, Platelets at 119K, Lymph Abs at 0.5K, Glucose at 101, Creatinine at 1.32, Calcium at 8.8 11/09/19 of Magnesium at 1.8: WNL  On review of systems, pt reports eating well, ringing in ears, blurry vision, staying active and denies reflux, irregular bowl movements, nausea, vomiting and any other symptoms.   MEDICAL HISTORY:  Past Medical History:  Diagnosis Date  . Arthritis   . Change in stool 02/22/2017  . Gout 02/22/2017  . Hyperlipidemia   . Sleep apnea    Uses CPAP  . Tonsil cancer [facility])    metastatic     SURGICAL HISTORY: Past Surgical  History:  Procedure Laterality Date  . CARPAL TUNNEL RELEASE Right 04/10/2019  . DIRECT LARYNGOSCOPY N/A 08/21/2019   Procedure: DIRECT LARYNGOSCOPY - WITH BX;  Surgeon: [name], MD;  Location: [place];  Service: ENT;  Laterality: N/A;  . IR GASTROSTOMY TUBE MOD SED  10/13/2019  . IR IMAGING GUIDED PORT INSERTION  10/13/2019  . NECK DISSECTION  09/11/2019  . RADICAL NECK DISSECTION Right 09/11/2019   Procedure: RADICAL NECK DISSECTION;  Surgeon: [name], MD;  Location: [place];  Service: ENT;  Laterality: Right;  . TONSILLECTOMY Bilateral 08/21/2019   Procedure: TONSILLECTOMY;  Surgeon: [name], MD;  Location: [place];  Service: ENT;  Laterality: Bilateral;  . WISDOM TOOTH EXTRACTION       SOCIAL HISTORY: Social History   Socioeconomic  History  . Marital status: Married    Spouse name: Not on file  . Number of children: 1  . Years of education: Not on file  . Highest education level: Not on file  Occupational History  . Not on file  Tobacco Use  . Smoking status: Never Smoker  . Smokeless tobacco: Never Used  Vaping Use  . Vaping Use: Never used  Substance and Sexual Activity  . Alcohol use: No  . Drug use: No  . Sexual activity: Not on file  Other Topics Concern  . Not on file  Social History Narrative   Patient is married and has 1 son.   Patient has never smoked, used tobacco products.   Patient does not drink.  Patient does not use illicit drugs.   Social Determinants of Health   Financial Resource Strain:   . Difficulty of Paying Living Expenses:   Food Insecurity:   . Worried About Charity fundraiser in the Last Year:   . Arboriculturist in the Last Year:   Transportation Needs:   . Film/video editor (Medical):   Marland Kitchen Lack of Transportation (Non-Medical):   Physical Activity:   . Days of Exercise per Week:   . Minutes of Exercise per Session:   Stress:   . Feeling of Stress :   Social Connections:   . Frequency of Communication with Friends and Family:   . Frequency of Social Gatherings with Friends and Family:   . Attends Religious Services:   . Active Member of Clubs or Organizations:   . Attends Archivist Meetings:   Marland Kitchen Marital Status:   Intimate Partner Violence:   . Fear of Current or Ex-Partner:   . Emotionally Abused:   Marland Kitchen Physically Abused:   . Sexually Abused:      FAMILY HISTORY: Family History  Problem Relation Age of Onset  . COPD Mother   . Uterine cancer Mother   . Lung cancer Brother   . Lung cancer Maternal Grandfather   . Stomach cancer Paternal Grandmother      ALLERGIES:   has No Known Allergies.   MEDICATIONS:  Current Outpatient Medications  Medication Sig Dispense Refill  . HYDROcodone-acetaminophen (NORCO/VICODIN) 5-325 MG tablet Take 1  tablet by mouth every 6 (six) hours as needed for moderate pain. 30 tablet 0  . lidocaine (XYLOCAINE) 2 % solution Patient: Mix 1part 2% viscous lidocaine, 1part H20. Swish & swallow 43mL of diluted mixture, 63min before meals and at bedtime, up to QID 200 mL 2  . lidocaine-prilocaine (EMLA) cream     . loratadine (EQ LORATADINE) 10 MG tablet Take 10 mg by mouth daily.     Marland Kitchen  LORazepam (ATIVAN) 0.5 MG tablet Take 1-2 tablets (0.5-1 mg total) by mouth every 8 (eight) hours as needed for anxiety or sleep (chemotherapy related nausea). 30 tablet 0  . magic mouthwash w/lidocaine SOLN Take 5 mLs by mouth 4 (four) times daily as needed for mouth pain. 80 ml viscous lidocaine 2% 80 ml Mylanta 80 ml diphenhydramine 12.5 mg per 5 ml elixir 80 ml nystatin 100,000U suspension 80 ml prednisolone 15mg  per 49ml solution 80 ml distilled water Disp: 480 ml 480 mL 1  . ondansetron (ZOFRAN) 8 MG tablet Take 1 tablet (8 mg total) by mouth every 8 (eight) hours as needed for nausea or vomiting. 30 tablet 0  . pantoprazole (PROTONIX) 40 MG tablet Take 1 tablet (40 mg total) by mouth daily before breakfast. 30 tablet 2  . Polyethylene Glycol 3350 (MIRALAX PO) Take 17 g by mouth daily. Takes 17 g powder mixed in liquid (up to mark on cap) daily    . prochlorperazine (COMPAZINE) 10 MG tablet Take 1 tablet (10 mg total) by mouth every 6 (six) hours as needed for nausea or vomiting. 30 tablet 0  . senna-docusate (SENNA S) 8.6-50 MG tablet Take 2 tablets by mouth at bedtime as needed for mild constipation or moderate constipation. 60 tablet 1  . sodium fluoride (FLUORISHIELD) 1.1 % GEL dental gel APPLY TO TOOTHBRUSH OR IN FLUORIDE TRAYS AS DIRECTED. REPEAT NIGHTLY. SPIT OUT EXCESS DO NOT SWALLOW. DO NOT RINSE AFTERWARDS    . Colchicine (MITIGARE) 0.6 MG CAPS Take 2 capsules at onset of flare. Then take 1 capsule 1 hour later.  May take 1 capsule up to twice daily thereafter. (Patient not taking: Reported on 09/28/2019) 60  capsule 2  . HYDROcodone-acetaminophen (HYCET) 7.5-325 mg/15 ml solution Take 15 mLs by mouth 4 (four) times daily as needed for moderate pain. (Patient not taking: Reported on 09/28/2019) 420 mL 0  . promethazine (PHENERGAN) 25 MG suppository Place 1 suppository (25 mg total) rectally every 6 (six) hours as needed for nausea or vomiting. (Patient not taking: Reported on 09/28/2019) 12 suppository 1  . sodium fluoride (PREVIDENT 5000 PLUS) 1.1 % CREA dental cream Apply to tooth brush or in fluoride trays as directed.  Repeat nightly. Spit out excess-DO NOT swallow. DO NOT rinse afterwards. (Patient not taking: Reported on 10/25/2019) 2 Tube prn  . tamsulosin (FLOMAX) 0.4 MG CAPS capsule Take 1 capsule (0.4 mg total) by mouth daily. (Patient not taking: Reported on 11/09/2019) 7 capsule 0  . traZODone (DESYREL) 50 MG tablet Take 1 tablet (50 mg total) by mouth at bedtime as needed for sleep. (Patient not taking: Reported on 10/05/2019) 30 tablet 3   No current facility-administered medications for this visit.   Facility-Administered Medications Ordered in Other Visits  Medication Dose Route Frequency Provider Last Rate Last Admin  . 0.9 %  sodium chloride infusion   Intravenous Continuous Monia Sabal, PA-C         REVIEW OF SYSTEMS:   A 10+ POINT REVIEW OF SYSTEMS WAS OBTAINED including neurology, dermatology, psychiatry, cardiac, respiratory, lymph, extremities, GI, GU, Musculoskeletal, constitutional, breasts, reproductive, HEENT.  All pertinent positives are noted in the HPI.  All others are negative.   PHYSICAL EXAMINATION: ECOG PERFORMANCE STATUS: 1 - Symptomatic but completely ambulatory  Vitals:   11/09/19 0905  BP: 114/74  Pulse: 75  Resp: 18  Temp: 98.1 F (36.7 C)  SpO2: 98%   Filed Weights   11/09/19 0905  Weight: 277 lb 14.4 oz (126.1  kg)   Body mass index is 42.25 kg/m.   GENERAL:alert, in no acute distress and comfortable SKIN: no acute rashes, no significant  lesions EYES: conjunctiva are pink and non-injected, sclera anicteric OROPHARYNX: MMM, no exudates, no oropharyngeal erythema or ulceration NECK: supple, no JVD LYMPH:  no palpable lymphadenopathy in the cervical, axillary or inguinal regions LUNGS: clear to auscultation b/l with normal respiratory effort HEART: regular rate & rhythm ABDOMEN:  normoactive bowel sounds , non tender, not distended. Extremity: no pedal edema PSYCH: alert & oriented x 3 with fluent speech NEURO: no focal motor/sensory deficits  LABORATORY DATA:  I have reviewed the data as listed  CBC Latest Ref Rng & Units 11/09/2019 11/03/2019 11/01/2019  WBC 4.0 - 10.5 K/uL 3.9(L) 10.6(H) 5.6  Hemoglobin 13.0 - 17.0 g/dL 13.1 13.6 13.3  Hematocrit 39 - 52 % 38.9(L) 40.0 40.0  Platelets 150 - 400 K/uL 119(L) 183 175    CMP Latest Ref Rng & Units 11/09/2019 11/03/2019 11/01/2019  Glucose 70 - 99 mg/dL 101(H) 96 110(H)  BUN 6 - 20 mg/dL 16 22(H) 16  Creatinine 0.61 - 1.24 mg/dL 1.32(H) 1.17 1.26(H)  Sodium 135 - 145 mmol/L 139 137 138  Potassium 3.5 - 5.1 mmol/L 4.2 4.3 4.2  Chloride 98 - 111 mmol/L 101 102 103  CO2 22 - 32 mmol/L 28 24 29   Calcium 8.9 - 10.3 mg/dL 8.8(L) 8.7(L) 8.7(L)  Total Protein 6.5 - 8.1 g/dL - 6.7 -  Total Bilirubin 0.3 - 1.2 mg/dL - 0.4 -  Alkaline Phos 38 - 126 U/L - 69 -  AST 15 - 41 U/L - 14(L) -  ALT 0 - 44 U/L - 20 -     RADIOGRAPHIC STUDIES: I have personally reviewed the radiological images as listed and agreed with the findings in the report.  PET/CT 08/10/2019: IMPRESSION: 1. The 2.8 cm right level IIa lymph node has a maximum SUV of 28.4. A primary lesion is not readily seen. There is some asymmetric palatine tonsillar activity with the left tonsil having maximum SUV of 12.4 and the right 7.8, but no tonsillar mass is observed on the CT data. 2. No compelling findings of active malignancy in the chest, abdomen/pelvis, or skeleton. There is some accentuated anal activity which is  likely physiologic given the lack of visible abnormality on the CT data. 3. Other imaging findings of potential clinical significance: Diffuse hepatic steatosis. Hypodense left renal lesion is probably a cyst but technically too small to characterize.   Electronically Signed   By: Van Clines M.D.   On: 08/10/2019 15:54   08/21/19 of Surgical Pathology [id number]    SURGICAL PATHOLOGY  * THIS IS AN AMENDED REPORT   CASE: [id number]  PATIENT: [facility]  Surgical Pathology Report   Amendment    Reason for Amendment #1: Other   Clinical History: squamous cell of neck and head (cm)     FINAL MICROSCOPIC DIAGNOSIS:   A. TONSIL, RIGHT:  - Squamous cell carcinoma, basaloid, p16 positive.  - Margins not involved.  - Carcinoma focally 1 mm from surgical margin.  - See oncology table and comment.   B. TONSIL, LEFT:  - Benign tonsil.  - No malignancy identified.   C. TONGUE, RIGHT BASE, BIOPSY:  - Benign tonsillar tissue.  - No malignancy identified.   D. TONGUE, LEFT BASE, BIOPSY:  - Benign tonsillar tissue.  - No malignancy identified.   ONCOLOGY TABLE:  PHARYNX (OROPHARYNX, HYPOPHARYNX, NASOPHARYNX):  Procedure: Right and left  tonsillectomy and right and left tongue base  biopsies.  Tumor Site: Right tonsil.  Tumor Laterality: Right tonsil.  Tumor Focality: Unifocal.  Tumor Size: 1 cm x 0.3 cm (3 mm) depth.  Histologic Type: Squamous cell carcinoma, basaloid type, p16 positive,  EBV negative.  Histologic Grade: Poorly differentiated.  Margins: Free of tumor. Carcinoma focally 1 mm from the nearest  surgical margin.    Distance to closest Margin: 1 mm.  Lymphovascular Invasion: Present.  Perineural Invasion: Not identified.  Regional Lymph Nodes: No lymph nodes submitted with this case. The  previous right neck lymph node needle core biopsy (MCS [id number] [phone]) shows  squamous cell carcinoma.  Pathologic Stage Classification (pTNM, AJCC  8th Edition): pT1, pN1, see  comment.    09/11/19 of Surgical Pathology Report [id number])  COMMENT:   Metastatic carcinoma is present in 3 lymph nodes - 1 level 1 lymph node,  1 level 2 lymph node (this metastatic focus also involves skeletal  muscle), and 1 level 3 lymph node (highlighted by a cytokeratin  immunohistochemical stain). Additional studies can be performed (block  A11) upon clinician request.    ASSESSMENT & PLAN:   Dustin Giammarco Sr. is a 54 y.o. male with:  1. Rt Tonsilar Basaloid Squamous cell carcinoma , HPV+ve. PT1,pN1,M0. 3/49 LN+ve High risk features - LVI, extranodal extension, high risk morphology. Significant second hand smoke exposure S/p b/l tonsillectomy on 08/21/2019 S/p rt radical neck dissection   PLAN: -Discussed pt labwork today, 11/09/19; of CBC w/diff and CMP is as follows: all values are WNL except for WBC at 3.9K, HCT at 38.9, Platelets at 119K, Lymph Abs at 0.5K, Glucose at 101, Creatinine at 1.32, Calcium at 8.8 -Discussed 11/09/19 of Magnesium at 1.8: WNL -Advised on using tube feeding- and continue eating well  -Advised on blurry vision- Recommends using artificial tears  -Advised on blood in urine -possibly passed a kidney stone-- has been given an urology referral. -Advised continue watching blood counts with chemotherapy -The pt has no prohibitive toxicities from continuing C4 weekly Cisplatin at this time. -Recommended that the pt continue to eat well, drink at least 48-64 oz of water each day, and walk 20-30 minutes each day.  -Recommend pt use two Trazodone at night to improve sleeplessness  -Rx Lorazepam to use prn for nausea and anxiety, Magic Mouthwash -Recommend pt f/u with Ernestene Kiel as scheduled -Recommend taking OTC Vitamin B-complex supplement  -Recommends continue watching CPAP -Continue to f/u with Dr. Isidore Moos  -Will see back in 1 Week  FOLLOW UP: F/u as per scheduled appointments for chemotherapy tomorrow and  for next week   The total time spent in the appt was 30 minutes and more than 50% was on counseling and direct patient cares.  All of the patient's questions were answered with apparent satisfaction. The patient knows to call the clinic with any problems, questions or concerns.  Sullivan Lone MD [place] AAHIVMS [facility] Hematology/Oncology Physician [facility]  (Office):       [phone] (Work cell):  [phone] (Fax):           [phone]  11/09/2019 9:39 AM  I, Dawayne Cirri am acting as a scribe for Dr. Sullivan Lone.   .I have reviewed the above documentation for accuracy and completeness, and I agree with the above. Brunetta Genera MD

## 2019-11-09 ENCOUNTER — Inpatient Hospital Stay: Payer: 59

## 2019-11-09 ENCOUNTER — Inpatient Hospital Stay: Payer: 59 | Attending: Hematology | Admitting: Hematology

## 2019-11-09 ENCOUNTER — Ambulatory Visit
Admission: RE | Admit: 2019-11-09 | Discharge: 2019-11-09 | Disposition: A | Discharge status: Home / Self Care | Payer: 59 | Source: Ambulatory Visit | Attending: Radiation Oncology | Admitting: Radiation Oncology

## 2019-11-09 ENCOUNTER — Encounter: Payer: Self-pay | Admitting: General Practice

## 2019-11-09 ENCOUNTER — Other Ambulatory Visit: Payer: Self-pay

## 2019-11-09 ENCOUNTER — Ambulatory Visit: Payer: 59 | Attending: Radiation Oncology

## 2019-11-09 VITALS — BP 114/74 | HR 75 | Temp 98.1°F | Resp 18 | Ht 68.0 in | Wt 277.9 lb

## 2019-11-09 DIAGNOSIS — R109 Unspecified abdominal pain: Secondary | ICD-10-CM | POA: Diagnosis not present

## 2019-11-09 DIAGNOSIS — R200 Anesthesia of skin: Secondary | ICD-10-CM | POA: Diagnosis not present

## 2019-11-09 DIAGNOSIS — R319 Hematuria, unspecified: Secondary | ICD-10-CM | POA: Insufficient documentation

## 2019-11-09 DIAGNOSIS — G4730 Sleep apnea, unspecified: Secondary | ICD-10-CM | POA: Insufficient documentation

## 2019-11-09 DIAGNOSIS — G4700 Insomnia, unspecified: Secondary | ICD-10-CM | POA: Diagnosis not present

## 2019-11-09 DIAGNOSIS — K5900 Constipation, unspecified: Secondary | ICD-10-CM | POA: Insufficient documentation

## 2019-11-09 DIAGNOSIS — R1310 Dysphagia, unspecified: Secondary | ICD-10-CM | POA: Diagnosis not present

## 2019-11-09 DIAGNOSIS — H9313 Tinnitus, bilateral: Secondary | ICD-10-CM | POA: Insufficient documentation

## 2019-11-09 DIAGNOSIS — K1233 Oral mucositis (ulcerative) due to radiation: Secondary | ICD-10-CM | POA: Diagnosis not present

## 2019-11-09 DIAGNOSIS — C099 Malignant neoplasm of tonsil, unspecified: Secondary | ICD-10-CM | POA: Insufficient documentation

## 2019-11-09 DIAGNOSIS — Z5111 Encounter for antineoplastic chemotherapy: Secondary | ICD-10-CM | POA: Diagnosis not present

## 2019-11-09 DIAGNOSIS — K921 Melena: Secondary | ICD-10-CM | POA: Insufficient documentation

## 2019-11-09 DIAGNOSIS — I10 Essential (primary) hypertension: Secondary | ICD-10-CM | POA: Insufficient documentation

## 2019-11-09 DIAGNOSIS — Z510 Encounter for antineoplastic radiation therapy: Secondary | ICD-10-CM | POA: Insufficient documentation

## 2019-11-09 DIAGNOSIS — R202 Paresthesia of skin: Secondary | ICD-10-CM | POA: Diagnosis not present

## 2019-11-09 DIAGNOSIS — D696 Thrombocytopenia, unspecified: Secondary | ICD-10-CM | POA: Insufficient documentation

## 2019-11-09 DIAGNOSIS — M109 Gout, unspecified: Secondary | ICD-10-CM | POA: Diagnosis not present

## 2019-11-09 DIAGNOSIS — E785 Hyperlipidemia, unspecified: Secondary | ICD-10-CM | POA: Diagnosis not present

## 2019-11-09 DIAGNOSIS — C090 Malignant neoplasm of tonsillar fossa: Secondary | ICD-10-CM | POA: Diagnosis not present

## 2019-11-09 DIAGNOSIS — M4802 Spinal stenosis, cervical region: Secondary | ICD-10-CM | POA: Diagnosis not present

## 2019-11-09 DIAGNOSIS — Z79899 Other long term (current) drug therapy: Secondary | ICD-10-CM | POA: Diagnosis not present

## 2019-11-09 DIAGNOSIS — Z801 Family history of malignant neoplasm of trachea, bronchus and lung: Secondary | ICD-10-CM | POA: Insufficient documentation

## 2019-11-09 DIAGNOSIS — R634 Abnormal weight loss: Secondary | ICD-10-CM | POA: Diagnosis not present

## 2019-11-09 DIAGNOSIS — Z95828 Presence of other vascular implants and grafts: Secondary | ICD-10-CM

## 2019-11-09 DIAGNOSIS — Z800 Family history of malignant neoplasm of digestive organs: Secondary | ICD-10-CM | POA: Insufficient documentation

## 2019-11-09 DIAGNOSIS — Z7189 Other specified counseling: Secondary | ICD-10-CM

## 2019-11-09 DIAGNOSIS — H538 Other visual disturbances: Secondary | ICD-10-CM | POA: Diagnosis not present

## 2019-11-09 LAB — CBC WITH DIFFERENTIAL/PLATELET
Abs Immature Granulocytes: 0.01 10*3/uL (ref 0.00–0.07)
Basophils Absolute: 0 10*3/uL (ref 0.0–0.1)
Basophils Relative: 0 %
Eosinophils Absolute: 0 10*3/uL (ref 0.0–0.5)
Eosinophils Relative: 1 %
HCT: 38.9 % — ABNORMAL LOW (ref 39.0–52.0)
Hemoglobin: 13.1 g/dL (ref 13.0–17.0)
Immature Granulocytes: 0 %
Lymphocytes Relative: 13 %
Lymphs Abs: 0.5 10*3/uL — ABNORMAL LOW (ref 0.7–4.0)
MCH: 30.3 pg (ref 26.0–34.0)
MCHC: 33.7 g/dL (ref 30.0–36.0)
MCV: 90 fL (ref 80.0–100.0)
Monocytes Absolute: 0.4 10*3/uL (ref 0.1–1.0)
Monocytes Relative: 10 %
Neutro Abs: 2.9 10*3/uL (ref 1.7–7.7)
Neutrophils Relative %: 76 %
Platelets: 119 10*3/uL — ABNORMAL LOW (ref 150–400)
RBC: 4.32 MIL/uL (ref 4.22–5.81)
RDW: 13.6 % (ref 11.5–15.5)
WBC: 3.9 10*3/uL — ABNORMAL LOW (ref 4.0–10.5)
nRBC: 0 % (ref 0.0–0.2)

## 2019-11-09 LAB — BASIC METABOLIC PANEL
Anion gap: 10 (ref 5–15)
BUN: 16 mg/dL (ref 6–20)
CO2: 28 mmol/L (ref 22–32)
Calcium: 8.8 mg/dL — ABNORMAL LOW (ref 8.9–10.3)
Chloride: 101 mmol/L (ref 98–111)
Creatinine, Ser: 1.32 mg/dL — ABNORMAL HIGH (ref 0.61–1.24)
GFR calc Af Amer: >60 mL/min (ref >60)
GFR calc non Af Amer: >60 mL/min (ref >60)
Glucose, Bld: 101 mg/dL — ABNORMAL HIGH (ref 70–99)
Potassium: 4.2 mmol/L (ref 3.5–5.1)
Sodium: 139 mmol/L (ref 135–145)

## 2019-11-09 LAB — MAGNESIUM: Magnesium: 1.8 mg/dL (ref 1.7–2.4)

## 2019-11-09 MED ORDER — HEPARIN SOD (PORK) LOCK FLUSH 100 UNIT/ML IV SOLN
500.0000 [IU] | Freq: Once | INTRAVENOUS | Status: AC
  Administered 2019-11-09: 500 [IU] via INTRAVENOUS
  Filled 2019-11-09: qty 5

## 2019-11-09 MED ORDER — MAGIC MOUTHWASH W/LIDOCAINE: 5.0000 mL | Freq: Four times a day (QID) | ORAL | 1 refills | Status: DC | PRN

## 2019-11-09 MED ORDER — SODIUM CHLORIDE 0.9% FLUSH
10.0000 mL | Freq: Once | INTRAVENOUS | Status: AC
  Administered 2019-11-09: 10 mL
  Filled 2019-11-09: qty 10

## 2019-11-09 NOTE — Progress Notes (Signed)
Oncology Nurse Navigator Documentation  I met briefly with patient during his appointment today with Garald Balding SLP. I answered their questions as able and they do know to call me if they have any further questions or concerns.   Harlow Asa RN, BSN, OCN Head & Neck Oncology Nurse [place] [facility] Phone # [phone]  Fax # [phone]

## 2019-11-09 NOTE — Patient Instructions (Signed)
Signs of Aspiration Pneumonia   . Chest pain/tightness . Fever (can be low grade) . Cough  o With foul-smelling phlegm (sputum) o With sputum containing pus or blood o With greenish sputum . Fatigue  . Shortness of breath  . Wheezing   **IF YOU HAVE THESE SIGNS, CONTACT YOUR DOCTOR OR GO TO THE EMERGENCY DEPARTMENT OR URGENT CARE AS SOON AS POSSIBLE**      

## 2019-11-09 NOTE — Progress Notes (Signed)
[facility] CSW Progress Notes  Met w patient and wife in exam room during [facility].  He reports he is doing well, and this has been a difficult 7 months.  HE has been out of work since carpal tunnel surgery in Fall 2020.  In the midst of recovery from surgery, he felt a lump in neck which turned out to be cancerous.  Around that same time, his only sibling was diagnosed w cancer and died a few weeks later.  Several days prior to the funeral, he was called and told he himself had cancer.  He did not share the news w his parents as they were also coping w the death of their son.  "We are in this alone, its just [place]" per wife and patient.  He is unable to do activities he enjoys like taking trips to the beach and other spontaneous outings.  He values time w his granddaughters.  He is currently spending time in his yard/garden.  He is still able to eat, but has some pain and needs to be careful what he does eat.  He is looking forward to being able to resume normal activities but is also aware that there will be some residual effects from his two surgeries for cancer.  Discussed the importance of cultivating resilience and acceptance when facing the changes that accompany cancer treatment.  Provided information on Support Center and encouraged him to call us for support/resources as needed.    Edwyna Shell, LCSW Clinical Social Worker Phone:  [phone] Cell:  [phone]

## 2019-11-09 NOTE — Therapy (Signed)
[facility] [address] [place], [place], [address] Phone: [phone]   Fax:  [phone]  Speech Language Pathology Treatment  Patient Details  Name: Dustin Rios Sr. MRN: [id number] Date of Birth: 11/06/1965 Referring Provider (SLP): Eppie Gibson, MD   Encounter Date: 11/09/2019   End of Session - 11/09/19 1224    Visit Number 2    Number of Visits 7    Date for SLP Re-Evaluation 03/29/20    SLP Start Time 70    SLP Stop Time  1107    SLP Time Calculation (min) 42 min    Activity Tolerance Patient tolerated treatment well           Past Medical History:  Diagnosis Date  . Arthritis   . Change in stool 02/22/2017  . Gout 02/22/2017  . Hyperlipidemia   . Sleep apnea    Uses CPAP  . Tonsil cancer [facility])    metastatic    Past Surgical History:  Procedure Laterality Date  . CARPAL TUNNEL RELEASE Right 04/10/2019  . DIRECT LARYNGOSCOPY N/A 08/21/2019   Procedure: DIRECT LARYNGOSCOPY - WITH BX;  Surgeon: [name], MD;  Location: [facility];  Service: ENT;  Laterality: N/A;  . IR GASTROSTOMY TUBE MOD SED  10/13/2019  . IR IMAGING GUIDED PORT INSERTION  10/13/2019  . NECK DISSECTION  09/11/2019  . RADICAL NECK DISSECTION Right 09/11/2019   Procedure: RADICAL NECK DISSECTION;  Surgeon: [name], MD;  Location: [facility];  Service: ENT;  Laterality: Right;  . TONSILLECTOMY Bilateral 08/21/2019   Procedure: TONSILLECTOMY;  Surgeon: [name], MD;  Location: [facility];  Service: ENT;  Laterality: Bilateral;  . WISDOM TOOTH EXTRACTION      There were no vitals filed for this visit.   Subjective Assessment - 11/09/19 1209    Subjective Pt is eating softer foods and driking liquids.    Patient is accompained by: Family member   wfe   Currently in Pain? Yes    Pain Score 2     Pain Location Throat    Pain Descriptors / Indicators Aching    Pain Onset 1 to 4 weeks ago                  ADULT SLP TREATMENT - 11/09/19 1035      General Information   Behavior/Cognition Alert;Cooperative;Pleasant mood      Treatment Provided   Treatment provided Dysphagia      Dysphagia Treatment   Temperature Spikes Noted No    Respiratory Status Room air    Treatment Methods Therapeutic exercise;Patient/caregiver education;Skilled observation    Patient observed directly with PO's Yes    Type of PO's observed Dysphagia 1 (puree);Thin liquids   pt politely refused cheese stick   Liquids provided via Cup    Oral Phase Signs & Symptoms --   none noted   Pharyngeal Phase Signs & Symptoms --   "gritty" residue cleared with H2O   Other treatment/comments "we're trying to stay off the feeding tube." (wife). Pt was independent with all exercises, although pt reports he has had suboptimal completion frequency so SLP reminded pt of BID completion. Pt told SLP of rationale for HEP with min A, and told SLP of overt s/s aspiration PNA with mod I.       Assessment / Recommendations / Plan   Plan Continue with current plan of care      Dysphagia Recommendations   Diet  recommendations --   as tolerated   Medication Administration --   as tolerated     Progression Toward Goals   Progression toward goals Progressing toward goals            SLP Education - 11/09/19 1223    Education Details BID HEP completion, overt s/s aspiration PNA    Person(s) Educated Patient;Spouse    Methods Explanation;Handout    Comprehension Verbalized understanding            SLP Short Term Goals - 11/09/19 1107      SLP SHORT TERM GOAL #1   Title Pt will demo knowledge of rationale for HEP completion    Time 1    Period --   sessions, for all STGs   Status On-going      SLP SHORT TERM GOAL #2   Title Pt will complete HEP with rare min A over two sessions    Baseline 11-09-19    Time 1    Status On-going      SLP SHORT TERM GOAL #3   Title Pt will tell SLP how a food journal can  hasten/facilitate return to more normalized diet    Time 3    Status On-going      SLP SHORT TERM GOAL #4   Title Pt will tell SLP 3 overt s/s aspiration PNA with modified independence    Status Achieved            SLP Long Term Goals - 11/09/19 1109      SLP LONG TERM GOAL #1   Title Pt will complete HEP with modified independence over 3 visits    Baseline 11-09-19    Time 4    Period --   sessions, for all LTGs   Status On-going      SLP LONG TERM GOAL #2   Title Pt will tell SLP when to decr frequency of HEP    Time 4    Status On-going            Plan - 11/09/19 1224    Clinical Impression Statement Pt presents today with WNL/WFL swallowing ability with applesauce and water. Pt diet consists of primarily soft foods, purees, and liquids. No overt s/sx aspiration PNA reported or observed today. Data suggests that as pts progress through rad or chemorad therapy that their swallowing ability will decrease. Also, WNL swallowing is threatened by muscle fibrosis that will likely develop after rad/chemorad is completed. Therefore, SLP developed an indivdualized exercise program to mitigate/eliminate pt's muscle fibrosis following and as a result of, pt's rad tx. Skilled ST would cont to be beneficial to the pt in order to regularly assess pt's safety with POs and/or need for instrumental swallow assessment, as well as to assess accurate completion of swallowing HEP.    Speech Therapy Frequency --   once approx every 4 weeks   Duration --   7 total sessions   Treatment/Interventions Aspiration precaution training;Pharyngeal strengthening exercises;Diet toleration management by SLP;Trials of upgraded texture/liquids;Patient/family education;SLP instruction and feedback    Potential to [place] provided today    Consulted and Agree with Plan of Care Patient           Patient will benefit from skilled therapeutic intervention in order to improve  the following deficits and impairments:   Dysphagia, unspecified type    Problem List Patient Active Problem List   Diagnosis Date Noted  .  Counseling regarding advance care planning and goals of care 10/12/2019  . Malignant neoplasm of tonsillar fossa ([facility]) 09/11/2019  . S/P tonsillectomy 08/21/2019  . Squamous cell carcinoma of head and neck ([facility]) 08/10/2019  . Morbid obesity ([facility]) 06/08/2019  . Moderate mixed hyperlipidemia not requiring statin therapy 06/08/2019  . OSA (obstructive sleep apnea) 06/08/2019  . Change in stool 02/22/2017  . Gout 02/22/2017    [place] ,[place], [place]  11/09/2019, 12:28 PM  [facility] [address] [place], [place], [address] Phone: [phone]   Fax:  [phone]   Name: Dustin Seeberger Sr. MRN: [id number] Date of Birth: Feb 11, 1966

## 2019-11-09 NOTE — Patient Instructions (Signed)

## 2019-11-10 ENCOUNTER — Other Ambulatory Visit: Payer: Self-pay | Admitting: Hematology

## 2019-11-10 ENCOUNTER — Inpatient Hospital Stay: Payer: 59

## 2019-11-10 ENCOUNTER — Ambulatory Visit
Admission: RE | Admit: 2019-11-10 | Discharge: 2019-11-10 | Disposition: A | Discharge status: Home / Self Care | Payer: 59 | Source: Ambulatory Visit | Attending: Radiation Oncology | Admitting: Radiation Oncology

## 2019-11-10 ENCOUNTER — Inpatient Hospital Stay: Payer: 59 | Admitting: Nutrition

## 2019-11-10 ENCOUNTER — Other Ambulatory Visit: Payer: Self-pay

## 2019-11-10 VITALS — BP 127/78 | HR 68 | Temp 98.6°F | Resp 18

## 2019-11-10 DIAGNOSIS — C099 Malignant neoplasm of tonsil, unspecified: Secondary | ICD-10-CM | POA: Diagnosis not present

## 2019-11-10 DIAGNOSIS — C090 Malignant neoplasm of tonsillar fossa: Secondary | ICD-10-CM

## 2019-11-10 DIAGNOSIS — Z7189 Other specified counseling: Secondary | ICD-10-CM

## 2019-11-10 MED ORDER — SODIUM CHLORIDE 0.9 % IV SOLN
150.0000 mg | Freq: Once | INTRAVENOUS | Status: AC
  Administered 2019-11-10: 150 mg via INTRAVENOUS
  Filled 2019-11-10: qty 150

## 2019-11-10 MED ORDER — PALONOSETRON HCL INJECTION 0.25 MG/5ML
INTRAVENOUS | Status: AC
  Filled 2019-11-10: qty 5

## 2019-11-10 MED ORDER — OSMOLITE 1.5 CAL PO LIQD: ORAL | 3 refills | Status: DC

## 2019-11-10 MED ORDER — SODIUM CHLORIDE 0.9% FLUSH
10.0000 mL | INTRAVENOUS | Status: DC | PRN
  Administered 2019-11-10: 10 mL
  Filled 2019-11-10: qty 10

## 2019-11-10 MED ORDER — POTASSIUM CHLORIDE 2 MEQ/ML IV SOLN
Freq: Once | INTRAVENOUS | Status: AC
  Filled 2019-11-10: qty 10

## 2019-11-10 MED ORDER — SODIUM CHLORIDE 0.9 % IV SOLN
Freq: Once | INTRAVENOUS | Status: AC
  Filled 2019-11-10: qty 250

## 2019-11-10 MED ORDER — SODIUM CHLORIDE 0.9 % IV SOLN
10.0000 mg | Freq: Once | INTRAVENOUS | Status: AC
  Administered 2019-11-10: 10 mg via INTRAVENOUS
  Filled 2019-11-10: qty 10

## 2019-11-10 MED ORDER — PALONOSETRON HCL INJECTION 0.25 MG/5ML
0.2500 mg | Freq: Once | INTRAVENOUS | Status: AC
  Administered 2019-11-10: 0.25 mg via INTRAVENOUS

## 2019-11-10 MED ORDER — HEPARIN SOD (PORK) LOCK FLUSH 100 UNIT/ML IV SOLN
500.0000 [IU] | Freq: Once | INTRAVENOUS | Status: AC | PRN
  Administered 2019-11-10: 500 [IU]
  Filled 2019-11-10: qty 5

## 2019-11-10 MED ORDER — SODIUM CHLORIDE 0.9 % IV SOLN
39.2000 mg/m2 | Freq: Once | INTRAVENOUS | Status: AC
  Administered 2019-11-10: 100 mg via INTRAVENOUS
  Filled 2019-11-10: qty 100

## 2019-11-10 NOTE — Progress Notes (Signed)
Nutrition follow-up completed with patient during infusion for squamous cell cancer of the head and neck. Weight decreased and documented as 277.9 pounds July 1 down from 284.5 pounds June 24.  This is a 6.6 pound weight loss over 1 week which is significant.  Patient has lost 9% of his body weight since April 12, which is also significant Noted labs glucose 101, creatinine 1.32. Patient reports he is eating small amounts of very soft foods now including broth-based soups, eggs, and oatmeal.  He has been drinking probiotics and Ensure Plus. He has not tried Osmolite 1.5 via his feeding tube yet. Denies nausea, vomiting, constipation, and diarrhea.  Nutrition diagnosis: Food and nutrition related knowledge deficit continues.  Estimated nutrition needs: 2500-2700 cal, 140-150 g protein, 2.7 L fluid.  Intervention: Patient educated to consume 5 bottles of Ensure Plus, boost plus or Ensure Enlive daily by mouth.  He will also continue soft foods. Educated him on the importance of increased nutrition to prevent further weight loss. Instructed patient if he is unable to drink 5 oral nutrition supplements daily, he should begin Osmolite 1.5 via PEG.  He should substitute 1 bottle Osmolite 1.5 for every bottle of oral nutrition supplement he cannot consume by mouth. Education provided to patient along with written instructions.  Also educated patient on goal for tube feeding which is as follows:  2 cartons Osmolite 1.5, 3 times daily +1 carton Osmolite 1.5 daily with 120 mL before and after each bolus feeding.  In addition patient will give 30 mL Prosource mixed with 60 mL of water 4 times daily.  He will flush with an additional 60 mL of water after Prosource administration.   He will drink an additional 10 ounces of water daily. Tube feeding regimen provides 2885 cal, 144.3 g protein and 2887 mL free water. Tube feeding orders were written and [facility] was contacted.  Monitoring, evaluation,  goals: Patient will tolerate adequate calories and protein to minimize further weight loss.  Next visit: Friday, July 9 during infusion.  **Disclaimer: This note was dictated with voice recognition software. Similar sounding words can inadvertently be transcribed and this note may contain transcription errors which may not have been corrected upon publication of note.**

## 2019-11-14 ENCOUNTER — Ambulatory Visit
Admission: RE | Admit: 2019-11-14 | Discharge: 2019-11-14 | Disposition: A | Discharge status: Home / Self Care | Payer: 59 | Source: Ambulatory Visit | Attending: Radiation Oncology | Admitting: Radiation Oncology

## 2019-11-14 ENCOUNTER — Other Ambulatory Visit: Payer: Self-pay

## 2019-11-14 ENCOUNTER — Other Ambulatory Visit: Payer: Self-pay | Admitting: Medical

## 2019-11-14 DIAGNOSIS — C090 Malignant neoplasm of tonsillar fossa: Secondary | ICD-10-CM | POA: Diagnosis not present

## 2019-11-14 DIAGNOSIS — R310 Gross hematuria: Secondary | ICD-10-CM

## 2019-11-15 ENCOUNTER — Ambulatory Visit
Admission: RE | Admit: 2019-11-15 | Discharge: 2019-11-15 | Disposition: A | Discharge status: Home / Self Care | Payer: 59 | Source: Ambulatory Visit | Attending: Radiation Oncology | Admitting: Radiation Oncology

## 2019-11-15 ENCOUNTER — Encounter: Payer: 59 | Admitting: Nutrition

## 2019-11-15 DIAGNOSIS — C090 Malignant neoplasm of tonsillar fossa: Secondary | ICD-10-CM | POA: Diagnosis not present

## 2019-11-15 NOTE — Progress Notes (Signed)
HEMATOLOGY/ONCOLOGY CLINIC NOTE  Date of Service: 11/16/2019  Patient Care Team: Claretta Fraise, MD as PCP - General (Family Medicine) Jodi Marble, MD as Consulting Physician (Otolaryngology) Eppie Gibson, MD as Attending Physician (Radiation Oncology) Tish Men, MD as Consulting Physician (Hematology) Leota Sauers, RN (Inactive) as Oncology Nurse Navigator Malmfelt, Stephani Police, RN as Oncology Nurse Navigator (Oncology) Karie Mainland, RD as Dietitian (Nutrition)  REFERRING PHYSICIAN: Claretta Fraise, MD  CHIEF COMPLAINTS/PURPOSE OF CONSULTATION:  Squamous Cell Carcinoma of the Head and Neck  HISTORY OF PRESENTING ILLNESS:   Dustin Leiter Sr. is a wonderful 54 y.o. male who has been referred to [place] by Claretta Fraise, MD for evaluation and management of Squamous Cell Carcinoma of the Head and Neck. Pt is accompanied today by [name], wife. The pt reports that he is doing well overall.   The pt reports he is fine. Pt has had both of his COVID19 vaccine. He is healing well from surgery. Some of the nerves were damaged and he has been working it. Part of his salivary gland on the right side was taken out and it burns when he starts to eat. Pt has seen swallowing evaluation today prior to coming. He is back to eating whatever he wants to now. When swallowing he has a burning sensation. Pt has a sleep apnea machine that dries out his throat. Pt also cannot open mouth completely since the dissection.  Pt had a biopsy on March 17th and found it was a squamous cell carcinoma. From his surgery he has various burning, tingling and numbness around his head, neck and shoulder.   Pt has sleep apnea. He also had gout but stopped drinking tea and has not had a flair up since the end of last year. He has no other major medical problems and works with Forensic psychologist. He has had slight hearing loss over the years due to job and being in the [employer]. Pt has never smoked, but his mother and  brohter smoked a lot and pt was exposed to second hand smoke for about 23 years. Many of the pt's family members especially from his mother side have had cancer's that were very aggressive and they passed at early ages.   Of note prior to the patient's visit today, pt has had Surgical Pathology ([id number]) completed on 09/11/19 with results revealing "LYMPH NODE, MODIFIED RADICAL NECK I-IV, REGIONAL DISSECTION: Metastatic carcinoma in 3 of 49 lymph nodes (3/49), with extracapsular extension." Pt has had Surgical Pathology ([id number]) completed on 08/21/19 with results revealing "A. TONSIL, RIGHT: Squamous cell carcinoma, basaloid, p16 positive.  Margins not involved.  Carcinoma focally 1 mm from surgical margin.  See oncology table and comment.  B. TONSIL, LEFT:  Benign tonsil.  No malignancy identified. C. TONGUE, RIGHT BASE, BIOPSY:  Benign tonsillar tissue. No malignancy identified. D. TONGUE, LEFT BASE, BIOPSY: Benign tonsillar tissue.  No malignancy identified. "  Pt has had PET Skull Base to Thigh ([id number]) completed on 08/10/19 with results revealing "1. The 2.8 cm right level IIa lymph node has a maximum SUV of 28.4. A primary lesion is not readily seen. There is some asymmetric palatine tonsillar activity with the left tonsil having maximum SUV of 12.4 and the right 7.8, but no tonsillar mass is observed on the CT data. 2. No compelling findings of active malignancy in the chest, abdomen/pelvis, or skeleton. There is some accentuated anal activity which is likely physiologic given the lack of visible abnormality on the CT  data. 3. Other imaging findings of potential clinical significance: Diffuse hepatic steatosis. Hypodense left renal lesion is probably a cyst but technically too small to characterize." Pt has had [place] Core Biopsy ([id number]) completed on  07/26/19 with results revealing "Successful ultrasound-guided right cervical lymph node/lesion Biopsy." Pt has had of CT Soft Tissue  Neck ([id number]) completed on 07/13/19 with results revealing "4.1 x 3.2 cm necrotic/cystic node or nodal conglomerate at the right level II station likely reflecting nodal metastatic disease. No definite primary mass is identified within the oral cavity, pharynx or larynx. Correlate with direct visualization and consider direct tissue sampling. Additionally, PET-CT may be helpful. Cervical spondylosis as described. A prominent C6-C7 posterior disc osteophyte contributes to at least moderate bony spinal canal stenosis. Multilevel neural foraminal narrowing."  Most recent lab results (09/11/19) of CBC is as follows: all values are WNL except for Glucose at 100, Calcium at 8.7  On review of systems, pt reports various tingling, numbness, burnings sensations around head neck and shoulder, weight loss, insomnia and denies back pain, abdominal pain and any other symptoms.   On PMHx the pt reports tonsillectomy 08/21/19, radical neck dissection 09/11/19, sleep apnea, gout, wisdom tooth extraction, carpal tunnel surgery    On Social Hx the pt reports never smoked, smoked marijuana 30 years ago, works Forensic psychologist, pt was in [employer]   On Family Hx the pt reports brother metastatic lung cancer at age 13, [place] had liver cancer and pancreatic cancer in early 65's of age, mother has uterine cancer at age 12, grandmother on fathers side has stomach cancer, great grandmother had lung cancer    INTERVAL HISTORY:  Dustin Fennell Sr. is a wonderful 54 y.o. male who is here for evaluation and management of Squamous Cell Carcinoma of the Head and Neck. He is here prior to C5 of weekly Cisplatin. Pt is joined by his wife. The patient's last visit with [place] was on 11/09/19. The pt reports that he is doing well overall.  The pt reports he is good. Pt is having blood in the urine and stool again after chemotherapy. He is having black stools. Pt only sees blood after Cisplatin for about 48 hours then it  clears up. He reports its uncomfortable while urinating but not painful. He is having no abdominal pain or flank pain. Pt has been constipated but anytime he has a bowl movement there has been blood and its dark colored. He reports it is painful while having a bowl movement.  There is still ringing in his ears all the time. It is mildly worse than before treatment. He has not started using the tube feeding. Pt is seeing Ernestene Kiel tomorrow since he is only eating about half as much as he use to. He is also drinking 4-5 boost and ensures a day and milkshakes with additional protein. Pt is not sleeping well. He did not get the magic mouthwash due to cost but is still using salt and baking soda mouthwash.    Lab results today (11/16/19) of CBC w/diff and CMP is as follows: all values are WNL except for WBC at 3.1K, Platelets at 89K, Lymph As at 0.4K, Creatinine at 1.36, GFR, Est Non Af Am at 59 11/16/19 of Magnesium at 2.0: WNL  On review of systems, pt reports blood in urine, black/blood in stool, ringing in ears, nausea, loss of appetite, irregular sleeping, blurry vision, staying active, abdominal pain and denies acid reflux, flank pain and any other symptoms.   MEDICAL  HISTORY:  Past Medical History:  Diagnosis Date  . Arthritis   . Change in stool 02/22/2017  . Gout 02/22/2017  . Hyperlipidemia   . Sleep apnea    Uses CPAP  . Tonsil cancer [facility])    metastatic     SURGICAL HISTORY: Past Surgical History:  Procedure Laterality Date  . CARPAL TUNNEL RELEASE Right 04/10/2019  . DIRECT LARYNGOSCOPY N/A 08/21/2019   Procedure: DIRECT LARYNGOSCOPY - WITH BX;  Surgeon: [name], MD;  Location: [place];  Service: ENT;  Laterality: N/A;  . IR GASTROSTOMY TUBE MOD SED  10/13/2019  . IR IMAGING GUIDED PORT INSERTION  10/13/2019  . NECK DISSECTION  09/11/2019  . RADICAL NECK DISSECTION Right 09/11/2019   Procedure: RADICAL NECK DISSECTION;  Surgeon: [name], MD;  Location:  [place];  Service: ENT;  Laterality: Right;  . TONSILLECTOMY Bilateral 08/21/2019   Procedure: TONSILLECTOMY;  Surgeon: [name], MD;  Location: [place];  Service: ENT;  Laterality: Bilateral;  . WISDOM TOOTH EXTRACTION       SOCIAL HISTORY: Social History   Socioeconomic History  . Marital status: Married    Spouse name: Not on file  . Number of children: 1  . Years of education: Not on file  . Highest education level: Not on file  Occupational History  . Not on file  Tobacco Use  . Smoking status: Never Smoker  . Smokeless tobacco: Never Used  Vaping Use  . Vaping Use: Never used  Substance and Sexual Activity  . Alcohol use: No  . Drug use: No  . Sexual activity: Not on file  Other Topics Concern  . Not on file  Social History Narrative   Patient is married and has 1 son.   Patient has never smoked, used tobacco products.   Patient does not drink.  Patient does not use illicit drugs.   Social Determinants of Health   Financial Resource Strain:   . Difficulty of Paying Living Expenses:   Food Insecurity:   . Worried About Charity fundraiser in the Last Year:   . Arboriculturist in the Last Year:   Transportation Needs:   . Film/video editor (Medical):   Marland Kitchen Lack of Transportation (Non-Medical):   Physical Activity:   . Days of Exercise per Week:   . Minutes of Exercise per Session:   Stress:   . Feeling of Stress :   Social Connections:   . Frequency of Communication with Friends and Family:   . Frequency of Social Gatherings with Friends and Family:   . Attends Religious Services:   . Active Member of Clubs or Organizations:   . Attends Archivist Meetings:   Marland Kitchen Marital Status:   Intimate Partner Violence:   . Fear of Current or Ex-Partner:   . Emotionally Abused:   Marland Kitchen Physically Abused:   . Sexually Abused:      FAMILY HISTORY: Family History  Problem Relation Age of Onset  . COPD Mother   . Uterine cancer Mother    . Lung cancer Brother   . Lung cancer Maternal Grandfather   . Stomach cancer Paternal Grandmother      ALLERGIES:   has No Known Allergies.   MEDICATIONS:  Current Outpatient Medications  Medication Sig Dispense Refill  . Colchicine (MITIGARE) 0.6 MG CAPS Take 2 capsules at onset of flare. Then take 1 capsule 1 hour later.  May take 1 capsule up to  twice daily thereafter. (Patient not taking: Reported on 09/28/2019) 60 capsule 2  . HYDROcodone-acetaminophen (HYCET) 7.5-325 mg/15 ml solution Take 15 mLs by mouth 4 (four) times daily as needed for moderate pain. (Patient not taking: Reported on 09/28/2019) 420 mL 0  . HYDROcodone-acetaminophen (NORCO/VICODIN) 5-325 MG tablet Take 1 tablet by mouth every 6 (six) hours as needed for moderate pain. 30 tablet 0  . lidocaine (XYLOCAINE) 2 % solution Patient: Mix 1part 2% viscous lidocaine, 1part H20. Swish & swallow 59mL of diluted mixture, 74min before meals and at bedtime, up to QID 200 mL 2  . lidocaine-prilocaine (EMLA) cream     . loratadine (EQ LORATADINE) 10 MG tablet Take 10 mg by mouth daily.     Marland Kitchen LORazepam (ATIVAN) 0.5 MG tablet Take 1-2 tablets (0.5-1 mg total) by mouth every 8 (eight) hours as needed for anxiety or sleep (chemotherapy related nausea). 30 tablet 0  . magic mouthwash w/lidocaine SOLN Take 5 mLs by mouth 4 (four) times daily as needed for mouth pain. 80 ml viscous lidocaine 2% 80 ml Mylanta 80 ml diphenhydramine 12.5 mg per 5 ml elixir 80 ml nystatin 100,000U suspension 80 ml prednisolone 15mg  per 59ml solution 80 ml distilled water Disp: 480 ml 480 mL 1  . Nutritional Supplements (FEEDING SUPPLEMENT, OSMOLITE 1.5 CAL,) LIQD Begin Osmolite 1.5 via PEG.  Advance as tolerated to goal rate of 7 cartons daily in 4 feedings with 120 mL of free water before and after each bolus feeding.  In addition mix 30 mL Prosource with 60 mL of water 4 times daily and infuse into PEG with 60 mL free water after each Prosource  administration.  Drink an additional 10 ounces of water daily or put into PEG.  If it provides 2885 cal, 144.3 g protein and 2887 mL free water/100% estimated needs. 1659 mL 3  . ondansetron (ZOFRAN) 8 MG tablet Take 1 tablet (8 mg total) by mouth every 8 (eight) hours as needed for nausea or vomiting. 30 tablet 0  . pantoprazole (PROTONIX) 40 MG tablet Take 1 tablet (40 mg total) by mouth daily before breakfast. 30 tablet 2  . Polyethylene Glycol 3350 (MIRALAX PO) Take 17 g by mouth daily. Takes 17 g powder mixed in liquid (up to mark on cap) daily    . prochlorperazine (COMPAZINE) 10 MG tablet Take 1 tablet (10 mg total) by mouth every 6 (six) hours as needed for nausea or vomiting. 30 tablet 0  . promethazine (PHENERGAN) 25 MG suppository Place 1 suppository (25 mg total) rectally every 6 (six) hours as needed for nausea or vomiting. (Patient not taking: Reported on 09/28/2019) 12 suppository 1  . senna-docusate (SENNA S) 8.6-50 MG tablet Take 2 tablets by mouth at bedtime as needed for mild constipation or moderate constipation. 60 tablet 1  . sodium fluoride (FLUORISHIELD) 1.1 % GEL dental gel APPLY TO TOOTHBRUSH OR IN FLUORIDE TRAYS AS DIRECTED. REPEAT NIGHTLY. SPIT OUT EXCESS DO NOT SWALLOW. DO NOT RINSE AFTERWARDS    . sodium fluoride (PREVIDENT 5000 PLUS) 1.1 % CREA dental cream Apply to tooth brush or in fluoride trays as directed.  Repeat nightly. Spit out excess-DO NOT swallow. DO NOT rinse afterwards. (Patient not taking: Reported on 10/25/2019) 2 Tube prn  . tamsulosin (FLOMAX) 0.4 MG CAPS capsule Take 1 capsule (0.4 mg total) by mouth daily. (Patient not taking: Reported on 11/09/2019) 7 capsule 0  . traZODone (DESYREL) 50 MG tablet Take 1 tablet (50 mg total) by mouth at  bedtime as needed for sleep. (Patient not taking: Reported on 10/05/2019) 30 tablet 3   No current facility-administered medications for this visit.   Facility-Administered Medications Ordered in Other Visits  Medication  Dose Route Frequency Provider Last Rate Last Admin  . 0.9 %  sodium chloride infusion   Intravenous Continuous Monia Sabal, PA-C         REVIEW OF SYSTEMS:   A 10+ POINT REVIEW OF SYSTEMS WAS OBTAINED including neurology, dermatology, psychiatry, cardiac, respiratory, lymph, extremities, GI, GU, Musculoskeletal, constitutional, breasts, reproductive, HEENT.  All pertinent positives are noted in the HPI.  All others are negative.   PHYSICAL EXAMINATION: ECOG PERFORMANCE STATUS: 1 - Symptomatic but completely ambulatory  Vitals:   11/16/19 1024  BP: 118/79  Pulse: 85  Resp: 18  Temp: 98.1 F (36.7 C)  SpO2: 97%   Filed Weights   11/16/19 1024  Weight: 273 lb 1.6 oz (123.9 kg)   Body mass index is 41.52 kg/m.   GENERAL:alert, in no acute distress and comfortable SKIN: no acute rashes, no significant lesions EYES: conjunctiva are pink and non-injected, sclera anicteric OROPHARYNX: MMM, no exudates, no oropharyngeal erythema or ulceration NECK: supple, no JVD LYMPH:  no palpable lymphadenopathy in the cervical, axillary or inguinal regions LUNGS: clear to auscultation b/l with normal respiratory effort HEART: regular rate & rhythm ABDOMEN:  normoactive bowel sounds , non tender, not distended. Extremity: no pedal edema PSYCH: alert & oriented x 3 with fluent speech NEURO: no focal motor/sensory deficits  LABORATORY DATA:  I have reviewed the data as listed  CBC Latest Ref Rng & Units 11/16/2019 11/09/2019 11/03/2019  WBC 4.0 - 10.5 K/uL 3.1(L) 3.9(L) 10.6(H)  Hemoglobin 13.0 - 17.0 g/dL 13.2 13.1 13.6  Hematocrit 39 - 52 % 39.3 38.9(L) 40.0  Platelets 150 - 400 K/uL 89(L) 119(L) 183    CMP Latest Ref Rng & Units 11/16/2019 11/09/2019 11/03/2019  Glucose 70 - 99 mg/dL 92 101(H) 96  BUN 6 - 20 mg/dL 17 16 22(H)  Creatinine 0.61 - 1.24 mg/dL 1.36(H) 1.32(H) 1.17  Sodium 135 - 145 mmol/L 136 139 137  Potassium 3.5 - 5.1 mmol/L 4.8 4.2 4.3  Chloride 98 - 111 mmol/L 99 101 102   CO2 22 - 32 mmol/L 28 28 24   Calcium 8.9 - 10.3 mg/dL 9.4 8.8(L) 8.7(L)  Total Protein 6.5 - 8.1 g/dL - - 6.7  Total Bilirubin 0.3 - 1.2 mg/dL - - 0.4  Alkaline Phos 38 - 126 U/L - - 69  AST 15 - 41 U/L - - 14(L)  ALT 0 - 44 U/L - - 20     RADIOGRAPHIC STUDIES: I have personally reviewed the radiological images as listed and agreed with the findings in the report.  PET/CT 08/10/2019: IMPRESSION: 1. The 2.8 cm right level IIa lymph node has a maximum SUV of 28.4. A primary lesion is not readily seen. There is some asymmetric palatine tonsillar activity with the left tonsil having maximum SUV of 12.4 and the right 7.8, but no tonsillar mass is observed on the CT data. 2. No compelling findings of active malignancy in the chest, abdomen/pelvis, or skeleton. There is some accentuated anal activity which is likely physiologic given the lack of visible abnormality on the CT data. 3. Other imaging findings of potential clinical significance: Diffuse hepatic steatosis. Hypodense left renal lesion is probably a cyst but technically too small to characterize.   Electronically Signed   By: Van Clines M.D.   On:  08/10/2019 15:54   08/21/19 of Surgical Pathology [id number]    SURGICAL PATHOLOGY  * THIS IS AN AMENDED REPORT   CASE: [id number]  PATIENT: [facility]  Surgical Pathology Report   Amendment    Reason for Amendment #1: Other   Clinical History: squamous cell of neck and head (cm)     FINAL MICROSCOPIC DIAGNOSIS:   A. TONSIL, RIGHT:  - Squamous cell carcinoma, basaloid, p16 positive.  - Margins not involved.  - Carcinoma focally 1 mm from surgical margin.  - See oncology table and comment.   B. TONSIL, LEFT:  - Benign tonsil.  - No malignancy identified.   C. TONGUE, RIGHT BASE, BIOPSY:  - Benign tonsillar tissue.  - No malignancy identified.   D. TONGUE, LEFT BASE, BIOPSY:  - Benign tonsillar tissue.  - No malignancy identified.    ONCOLOGY TABLE:  PHARYNX (OROPHARYNX, HYPOPHARYNX, NASOPHARYNX):  Procedure: Right and left tonsillectomy and right and left tongue base  biopsies.  Tumor Site: Right tonsil.  Tumor Laterality: Right tonsil.  Tumor Focality: Unifocal.  Tumor Size: 1 cm x 0.3 cm (3 mm) depth.  Histologic Type: Squamous cell carcinoma, basaloid type, p16 positive,  EBV negative.  Histologic Grade: Poorly differentiated.  Margins: Free of tumor. Carcinoma focally 1 mm from the nearest  surgical margin.    Distance to closest Margin: 1 mm.  Lymphovascular Invasion: Present.  Perineural Invasion: Not identified.  Regional Lymph Nodes: No lymph nodes submitted with this case. The  previous right neck lymph node needle core biopsy (MCS 20 [phone]) shows  squamous cell carcinoma.  Pathologic Stage Classification (pTNM, AJCC 8th Edition): pT1, pN1, see  comment.    09/11/19 of Surgical Pathology Report [id number])  COMMENT:   Metastatic carcinoma is present in 3 lymph nodes - 1 level 1 lymph node,  1 level 2 lymph node (this metastatic focus also involves skeletal  muscle), and 1 level 3 lymph node (highlighted by a cytokeratin  immunohistochemical stain). Additional studies can be performed (block  A11) upon clinician request.    ASSESSMENT & PLAN:   Dustin Schoene Sr. is a 54 y.o. male with:  1. Rt Tonsilar Basaloid Squamous cell carcinoma , HPV+ve. PT1,pN1,M0. 3/49 LN+ve High risk features - LVI, extranodal extension, high risk morphology. Significant second hand smoke exposure S/p b/l tonsillectomy on 08/21/2019 S/p rt radical neck dissection   PLAN: -Discussed pt labwork today, 11/16/19; of CBC w/diff and CMP is as follows: all values are WNL except for WBC at 3.1K, Platelets at 89K, Lymph As at 0.4K, Creatinine at 1.36, GFR, Est Non Af Am at 59 -Discussed 11/16/19 of Magnesium at 2.0: WNL -Advised blood in urine is unusual for Cisplatin  -Advised on black/blood in stool  -stercoral ulcer -Advised on Tinnitus and blurry vision -Advised on using tube feeding- and continue eating well  -Advised on blood in urine - has been given an urology referral. -Advised continue watching blood counts with chemotherapy -Advised on kidney numbers are stable but slightly elevated  -Advised on radiation and chemotherapy tissues  -Recommended that the pt continue to eat well, drink at least 48-64 oz of water each day, and walk 20-30 minutes each day.  -Recommends getting magic mouthwash -trying a different pharmacy for better price. -Recommends seeing gastroenterologist -Recommend taking OTC Vitamin B-complex supplement -Recommend pt f/u with Ernestene Kiel as scheduled -Recommends going to ED if blood in stools continue despite skipping chemotherapy  -Will not get C5 treatment tomorrow due to thrombocytopenia, hematuria and  ototoxicity. -Will see back in 1 week and reassess if will be continuing with C6  FOLLOW UP: Plz cancel Cisplatin chemotherapy scheduled for 11/17/2019 RTC with Dr Irene Limbo as per scheduled appointments on 7/15   The total time spent in the appt was 30 minutes and more than 50% was on counseling and direct patient cares.  All of the patient's questions were answered with apparent satisfaction. The patient knows to call the clinic with any problems, questions or concerns.   Sullivan Lone MD MS AAHIVMS [facility] Hematology/Oncology Physician [facility]  (Office):       [phone] (Work cell):  [phone] (Fax):           [phone]  11/16/2019 11:01 AM  I, Dawayne Cirri am acting as a Education administrator for Dr. Sullivan Lone.   .I have reviewed the above documentation for accuracy and completeness, and I agree with the above. Brunetta Genera MD

## 2019-11-16 ENCOUNTER — Inpatient Hospital Stay: Payer: 59

## 2019-11-16 ENCOUNTER — Other Ambulatory Visit: Payer: Self-pay

## 2019-11-16 ENCOUNTER — Inpatient Hospital Stay (HOSPITAL_BASED_OUTPATIENT_CLINIC_OR_DEPARTMENT_OTHER): Payer: 59 | Admitting: Hematology

## 2019-11-16 ENCOUNTER — Ambulatory Visit
Admission: RE | Admit: 2019-11-16 | Discharge: 2019-11-16 | Disposition: A | Discharge status: Home / Self Care | Payer: 59 | Source: Ambulatory Visit | Attending: Radiation Oncology | Admitting: Radiation Oncology

## 2019-11-16 VITALS — BP 118/79 | HR 85 | Temp 98.1°F | Resp 18 | Ht 68.0 in | Wt 273.1 lb

## 2019-11-16 DIAGNOSIS — C099 Malignant neoplasm of tonsil, unspecified: Secondary | ICD-10-CM

## 2019-11-16 DIAGNOSIS — C090 Malignant neoplasm of tonsillar fossa: Secondary | ICD-10-CM

## 2019-11-16 DIAGNOSIS — Z5111 Encounter for antineoplastic chemotherapy: Secondary | ICD-10-CM | POA: Diagnosis not present

## 2019-11-16 DIAGNOSIS — R310 Gross hematuria: Secondary | ICD-10-CM | POA: Diagnosis not present

## 2019-11-16 DIAGNOSIS — Z7189 Other specified counseling: Secondary | ICD-10-CM

## 2019-11-16 DIAGNOSIS — K921 Melena: Secondary | ICD-10-CM | POA: Diagnosis not present

## 2019-11-16 LAB — CBC WITH DIFFERENTIAL/PLATELET
Abs Immature Granulocytes: 0.01 10*3/uL (ref 0.00–0.07)
Basophils Absolute: 0 10*3/uL (ref 0.0–0.1)
Basophils Relative: 0 %
Eosinophils Absolute: 0.1 10*3/uL (ref 0.0–0.5)
Eosinophils Relative: 2 %
HCT: 39.3 % (ref 39.0–52.0)
Hemoglobin: 13.2 g/dL (ref 13.0–17.0)
Immature Granulocytes: 0 %
Lymphocytes Relative: 11 %
Lymphs Abs: 0.4 10*3/uL — ABNORMAL LOW (ref 0.7–4.0)
MCH: 30.1 pg (ref 26.0–34.0)
MCHC: 33.6 g/dL (ref 30.0–36.0)
MCV: 89.7 fL (ref 80.0–100.0)
Monocytes Absolute: 0.3 10*3/uL (ref 0.1–1.0)
Monocytes Relative: 10 %
Neutro Abs: 2.4 10*3/uL (ref 1.7–7.7)
Neutrophils Relative %: 77 %
Platelets: 89 10*3/uL — ABNORMAL LOW (ref 150–400)
RBC: 4.38 MIL/uL (ref 4.22–5.81)
RDW: 14.2 % (ref 11.5–15.5)
WBC: 3.1 10*3/uL — ABNORMAL LOW (ref 4.0–10.5)
nRBC: 0 % (ref 0.0–0.2)

## 2019-11-16 LAB — MAGNESIUM: Magnesium: 2 mg/dL (ref 1.7–2.4)

## 2019-11-16 LAB — BASIC METABOLIC PANEL
Anion gap: 9 (ref 5–15)
BUN: 17 mg/dL (ref 6–20)
CO2: 28 mmol/L (ref 22–32)
Calcium: 9.4 mg/dL (ref 8.9–10.3)
Chloride: 99 mmol/L (ref 98–111)
Creatinine, Ser: 1.36 mg/dL — ABNORMAL HIGH (ref 0.61–1.24)
GFR calc Af Amer: >60 mL/min (ref >60)
GFR calc non Af Amer: 59 mL/min — ABNORMAL LOW (ref >60)
Glucose, Bld: 92 mg/dL (ref 70–99)
Potassium: 4.8 mmol/L (ref 3.5–5.1)
Sodium: 136 mmol/L (ref 135–145)

## 2019-11-16 MED ORDER — HEPARIN SOD (PORK) LOCK FLUSH 100 UNIT/ML IV SOLN
500.0000 [IU] | Freq: Once | INTRAVENOUS | Status: AC | PRN
  Administered 2019-11-16: 500 [IU]
  Filled 2019-11-16: qty 5

## 2019-11-16 MED ORDER — SODIUM CHLORIDE 0.9% FLUSH
10.0000 mL | INTRAVENOUS | Status: DC | PRN
  Administered 2019-11-16: 10 mL
  Filled 2019-11-16: qty 10

## 2019-11-17 ENCOUNTER — Ambulatory Visit
Admission: RE | Admit: 2019-11-17 | Discharge: 2019-11-17 | Disposition: A | Discharge status: Home / Self Care | Payer: 59 | Source: Ambulatory Visit | Attending: Radiation Oncology | Admitting: Radiation Oncology

## 2019-11-17 ENCOUNTER — Inpatient Hospital Stay: Payer: 59

## 2019-11-17 ENCOUNTER — Inpatient Hospital Stay: Payer: 59 | Admitting: Nutrition

## 2019-11-17 DIAGNOSIS — C090 Malignant neoplasm of tonsillar fossa: Secondary | ICD-10-CM | POA: Diagnosis not present

## 2019-11-17 NOTE — Progress Notes (Signed)
Oncology treatment plan was changed therefore I was unable to follow-up with patient during infusion for squamous cell cancer of the head neck.  I contacted patient by telephone. Weight decreased again this week to 273.1 pounds from 277.9 pounds.  Patient reports he has not begun using feedings as he was directed last week. Reports he is still drinking between 4 and 5 bottles of Ensure a day and eating soft foods. Reports some constipation and a little bit of nausea.  He is hoping both improved now that his chemotherapy has been discontinued.  Nutrition diagnosis: Food and nutrition related knowledge deficit continues.  Estimated nutrition needs: 2500-2700 cal, 140-150 g protein, 2.7 L fluid.  Intervention: Educated patient to begin 1 carton Osmolite 1.5, 3 times daily with 60 mL of free water before and after each bolus feeding. Drink between 4 and 5 bottles of Ensure Plus daily. Continue to eat soft foods as tolerated. If unable to drink Ensure or eat patient educated to increase Osmolite 1.5 to goal rate of 7 cartons daily. Patient agrees to follow-up with me next Thursday after his doctor's appointment.  Monitoring, evaluation, goals: Patient will tolerate tube feeding and oral intake to minimize further weight loss.  Next visit: Thursday, July 15 after MD visit.  **Disclaimer: This note was dictated with voice recognition software. Similar sounding words can inadvertently be transcribed and this note may contain transcription errors which may not have been corrected upon publication of note.**

## 2019-11-20 ENCOUNTER — Other Ambulatory Visit: Payer: Self-pay

## 2019-11-20 ENCOUNTER — Ambulatory Visit
Admission: RE | Admit: 2019-11-20 | Discharge: 2019-11-20 | Disposition: A | Discharge status: Home / Self Care | Payer: 59 | Source: Ambulatory Visit | Attending: Radiation Oncology | Admitting: Radiation Oncology

## 2019-11-20 DIAGNOSIS — C090 Malignant neoplasm of tonsillar fossa: Secondary | ICD-10-CM | POA: Diagnosis not present

## 2019-11-20 MED ORDER — SONAFINE EX EMUL
1.0000 "application " | Freq: Two times a day (BID) | CUTANEOUS | Status: DC
  Administered 2019-11-20: 1 via TOPICAL

## 2019-11-20 NOTE — Progress Notes (Signed)

## 2019-11-21 ENCOUNTER — Ambulatory Visit
Admission: RE | Admit: 2019-11-21 | Discharge: 2019-11-21 | Disposition: A | Discharge status: Home / Self Care | Payer: 59 | Source: Ambulatory Visit | Attending: Radiation Oncology | Admitting: Radiation Oncology

## 2019-11-21 ENCOUNTER — Other Ambulatory Visit: Payer: Self-pay

## 2019-11-21 DIAGNOSIS — C090 Malignant neoplasm of tonsillar fossa: Secondary | ICD-10-CM | POA: Diagnosis not present

## 2019-11-22 ENCOUNTER — Other Ambulatory Visit: Payer: 59

## 2019-11-22 ENCOUNTER — Ambulatory Visit
Admission: RE | Admit: 2019-11-22 | Discharge: 2019-11-22 | Disposition: A | Discharge status: Home / Self Care | Payer: 59 | Source: Ambulatory Visit | Attending: Radiation Oncology | Admitting: Radiation Oncology

## 2019-11-22 ENCOUNTER — Other Ambulatory Visit: Payer: Self-pay

## 2019-11-22 DIAGNOSIS — C090 Malignant neoplasm of tonsillar fossa: Secondary | ICD-10-CM | POA: Diagnosis not present

## 2019-11-22 NOTE — Progress Notes (Signed)
HEMATOLOGY/ONCOLOGY CLINIC NOTE  Date of Service: 11/23/2019  Patient Care Team: Claretta Fraise, MD as PCP - General (Family Medicine) Jodi Marble, MD as Consulting Physician (Otolaryngology) Eppie Gibson, MD as Attending Physician (Radiation Oncology) Tish Men, MD (Inactive) as Consulting Physician (Hematology) Leota Sauers, RN (Inactive) as Oncology Nurse Navigator Malmfelt, Stephani Police, RN as Oncology Nurse Navigator (Oncology) Karie Mainland, RD as Dietitian (Nutrition)  REFERRING PHYSICIAN: Claretta Fraise, MD  CHIEF COMPLAINTS/PURPOSE OF CONSULTATION:  Squamous Cell Carcinoma of the Head and Neck  HISTORY OF PRESENTING ILLNESS:   Dustin Leiter Sr. is a wonderful 54 y.o. male who has been referred to [place] by Claretta Fraise, MD for evaluation and management of Squamous Cell Carcinoma of the Head and Neck. Pt is accompanied today by [name], wife. The pt reports that he is doing well overall.   The pt reports he is fine. Pt has had both of his COVID19 vaccine. He is healing well from surgery. Some of the nerves were damaged and he has been working it. Part of his salivary gland on the right side was taken out and it burns when he starts to eat. Pt has seen swallowing evaluation today prior to coming. He is back to eating whatever he wants to now. When swallowing he has a burning sensation. Pt has a sleep apnea machine that dries out his throat. Pt also cannot open mouth completely since the dissection.  Pt had a biopsy on March 17th and found it was a squamous cell carcinoma. From his surgery he has various burning, tingling and numbness around his head, neck and shoulder.   Pt has sleep apnea. He also had gout but stopped drinking tea and has not had a flair up since the end of last year. He has no other major medical problems and works with Forensic psychologist. He has had slight hearing loss over the years due to job and being in the [employer]. Pt has never smoked, but his  mother and brohter smoked a lot and pt was exposed to second hand smoke for about 23 years. Many of the pt's family members especially from his mother side have had cancer's that were very aggressive and they passed at early ages.   Of note prior to the patient's visit today, pt has had Surgical Pathology ([id number]) completed on 09/11/19 with results revealing "LYMPH NODE, MODIFIED RADICAL NECK I-IV, REGIONAL DISSECTION: Metastatic carcinoma in 3 of 49 lymph nodes (3/49), with extracapsular extension." Pt has had Surgical Pathology ([id number]) completed on 08/21/19 with results revealing "A. TONSIL, RIGHT: Squamous cell carcinoma, basaloid, p16 positive.  Margins not involved.  Carcinoma focally 1 mm from surgical margin.  See oncology table and comment.  B. TONSIL, LEFT:  Benign tonsil.  No malignancy identified. C. TONGUE, RIGHT BASE, BIOPSY:  Benign tonsillar tissue. No malignancy identified. D. TONGUE, LEFT BASE, BIOPSY: Benign tonsillar tissue.  No malignancy identified. "  Pt has had PET Skull Base to Thigh ([id number]) completed on 08/10/19 with results revealing "1. The 2.8 cm right level IIa lymph node has a maximum SUV of 28.4. A primary lesion is not readily seen. There is some asymmetric palatine tonsillar activity with the left tonsil having maximum SUV of 12.4 and the right 7.8, but no tonsillar mass is observed on the CT data. 2. No compelling findings of active malignancy in the chest, abdomen/pelvis, or skeleton. There is some accentuated anal activity which is likely physiologic given the lack of visible abnormality on the  CT data. 3. Other imaging findings of potential clinical significance: Diffuse hepatic steatosis. Hypodense left renal lesion is probably a cyst but technically too small to characterize." Pt has had [place] Core Biopsy ([id number]) completed on  07/26/19 with results revealing "Successful ultrasound-guided right cervical lymph node/lesion Biopsy." Pt has had of CT  Soft Tissue Neck ([id number]) completed on 07/13/19 with results revealing "4.1 x 3.2 cm necrotic/cystic node or nodal conglomerate at the right level II station likely reflecting nodal metastatic disease. No definite primary mass is identified within the oral cavity, pharynx or larynx. Correlate with direct visualization and consider direct tissue sampling. Additionally, PET-CT may be helpful. Cervical spondylosis as described. A prominent C6-C7 posterior disc osteophyte contributes to at least moderate bony spinal canal stenosis. Multilevel neural foraminal narrowing."  Most recent lab results (09/11/19) of CBC is as follows: all values are WNL except for Glucose at 100, Calcium at 8.7  On review of systems, pt reports various tingling, numbness, burnings sensations around head neck and shoulder, weight loss, insomnia and denies back pain, abdominal pain and any other symptoms.   On PMHx the pt reports tonsillectomy 08/21/19, radical neck dissection 09/11/19, sleep apnea, gout, wisdom tooth extraction, carpal tunnel surgery    On Social Hx the pt reports never smoked, smoked marijuana 30 years ago, works Forensic psychologist, pt was in [employer]   On Family Hx the pt reports brother metastatic lung cancer at age 36, Dustin Rios had liver cancer and pancreatic cancer in early 14's of age, mother has uterine cancer at age 58, grandmother on fathers side has stomach cancer, great grandmother had lung cancer    INTERVAL HISTORY:  Dustin Grinder Sr. is a wonderful 54 y.o. male who is here for evaluation and management of Squamous Cell Carcinoma of the Head and Neck. He is here prior to C5 of weekly Cisplatin. Pt is joined by his wife. The patient's last visit with [place] was on 11/16/19. The pt reports that he is doing well overall.  The pt reports he is good. Pt does not see blood in the urine anymore. His urine is yellowish and he is urinating frequently. He has not been using Miralax daily. Pt's throat  is still soar. He was not able to get the magic mouthwash. Pt's oral secretions are thicker. He reports he is doing well emotionally. Pt is off of his normal routine. He will go outside and walk around. Pt is still not sleeping well due to his mind racing. He is not taking Lorazepam due to having to get up early. Pt has not been using the feeding tube. He is drinking 4-5 boost and 3-4 bottles of water which he recognizes is not enough.   Lab results today (11/23/19) of CBC w/diff and CMP is as follows: all values are WNL except for WBC at 2.8K, RBC at 3.99, Hemoglobin at 12, HCT at 35.7, Platelets at 106K, Lymph Abs at 0.4K,  Creatinine at 1.32 11/23/19 of Magnesium at 2.1: WNL  On review of systems, pt reports chills, mucosa, insomnia, weight loss and denies blood in urine, black/blood in stool, fevers, abdominal pains and any other symptoms.    MEDICAL HISTORY:  Past Medical History:  Diagnosis Date  . Arthritis   . Change in stool 02/22/2017  . Gout 02/22/2017  . Hyperlipidemia   . Sleep apnea    Uses CPAP  . Tonsil cancer [facility])    metastatic     SURGICAL HISTORY: Past Surgical History:  Procedure Laterality Date  .  CARPAL TUNNEL RELEASE Right 04/10/2019  . DIRECT LARYNGOSCOPY N/A 08/21/2019   Procedure: DIRECT LARYNGOSCOPY - WITH BX;  Surgeon: [name], MD;  Location: [place];  Service: ENT;  Laterality: N/A;  . IR GASTROSTOMY TUBE MOD SED  10/13/2019  . IR IMAGING GUIDED PORT INSERTION  10/13/2019  . NECK DISSECTION  09/11/2019  . RADICAL NECK DISSECTION Right 09/11/2019   Procedure: RADICAL NECK DISSECTION;  Surgeon: [name], MD;  Location: [place];  Service: ENT;  Laterality: Right;  . TONSILLECTOMY Bilateral 08/21/2019   Procedure: TONSILLECTOMY;  Surgeon: [name], MD;  Location: [place];  Service: ENT;  Laterality: Bilateral;  . WISDOM TOOTH EXTRACTION       SOCIAL HISTORY: Social History   Socioeconomic History  . Marital  status: Married    Spouse name: Not on file  . Number of children: 1  . Years of education: Not on file  . Highest education level: Not on file  Occupational History  . Not on file  Tobacco Use  . Smoking status: Never Smoker  . Smokeless tobacco: Never Used  Vaping Use  . Vaping Use: Never used  Substance and Sexual Activity  . Alcohol use: No  . Drug use: No  . Sexual activity: Not on file  Other Topics Concern  . Not on file  Social History Narrative   Patient is married and has 1 son.   Patient has never smoked, used tobacco products.   Patient does not drink.  Patient does not use illicit drugs.   Social Determinants of Health   Financial Resource Strain:   . Difficulty of Paying Living Expenses:   Food Insecurity:   . Worried About Charity fundraiser in the Last Year:   . Arboriculturist in the Last Year:   Transportation Needs:   . Film/video editor (Medical):   Marland Kitchen Lack of Transportation (Non-Medical):   Physical Activity:   . Days of Exercise per Week:   . Minutes of Exercise per Session:   Stress:   . Feeling of Stress :   Social Connections:   . Frequency of Communication with Friends and Family:   . Frequency of Social Gatherings with Friends and Family:   . Attends Religious Services:   . Active Member of Clubs or Organizations:   . Attends Archivist Meetings:   Marland Kitchen Marital Status:   Intimate Partner Violence:   . Fear of Current or Ex-Partner:   . Emotionally Abused:   Marland Kitchen Physically Abused:   . Sexually Abused:      FAMILY HISTORY: Family History  Problem Relation Age of Onset  . COPD Mother   . Uterine cancer Mother   . Lung cancer Brother   . Lung cancer Maternal Grandfather   . Stomach cancer Paternal Grandmother      ALLERGIES:   has No Known Allergies.   MEDICATIONS:  Current Outpatient Medications  Medication Sig Dispense Refill  . HYDROcodone-acetaminophen (NORCO/VICODIN) 5-325 MG tablet Take 1 tablet by mouth  every 6 (six) hours as needed for moderate pain. 30 tablet 0  . lidocaine (XYLOCAINE) 2 % solution Patient: Mix 1part 2% viscous lidocaine, 1part H20. Swish & swallow 26mL of diluted mixture, 52min before meals and at bedtime, up to QID 200 mL 2  . lidocaine-prilocaine (EMLA) cream     . loratadine (EQ LORATADINE) 10 MG tablet Take 10 mg by mouth daily.     Marland Kitchen LORazepam (ATIVAN)  0.5 MG tablet Take 1-2 tablets (0.5-1 mg total) by mouth every 8 (eight) hours as needed for anxiety or sleep (chemotherapy related nausea). 30 tablet 0  . magic mouthwash w/lidocaine SOLN Take 5 mLs by mouth 4 (four) times daily as needed for mouth pain. 80 ml viscous lidocaine 2% 80 ml Mylanta 80 ml diphenhydramine 12.5 mg per 5 ml elixir 80 ml nystatin 100,000U suspension 80 ml prednisolone 15mg  per 30ml solution 80 ml distilled water Disp: 480 ml 480 mL 1  . pantoprazole (PROTONIX) 40 MG tablet Take 1 tablet (40 mg total) by mouth daily before breakfast. 30 tablet 2  . Polyethylene Glycol 3350 (MIRALAX PO) Take 17 g by mouth daily. Takes 17 g powder mixed in liquid (up to mark on cap) daily    . prochlorperazine (COMPAZINE) 10 MG tablet Take 1 tablet (10 mg total) by mouth every 6 (six) hours as needed for nausea or vomiting. 30 tablet 0  . senna-docusate (SENNA S) 8.6-50 MG tablet Take 2 tablets by mouth at bedtime as needed for mild constipation or moderate constipation. 60 tablet 1  . sodium fluoride (FLUORISHIELD) 1.1 % GEL dental gel APPLY TO TOOTHBRUSH OR IN FLUORIDE TRAYS AS DIRECTED. REPEAT NIGHTLY. SPIT OUT EXCESS DO NOT SWALLOW. DO NOT RINSE AFTERWARDS    . Colchicine (MITIGARE) 0.6 MG CAPS Take 2 capsules at onset of flare. Then take 1 capsule 1 hour later.  May take 1 capsule up to twice daily thereafter. (Patient not taking: Reported on 09/28/2019) 60 capsule 2  . HYDROcodone-acetaminophen (HYCET) 7.5-325 mg/15 ml solution Take 15 mLs by mouth 4 (four) times daily as needed for moderate pain. (Patient not  taking: Reported on 09/28/2019) 420 mL 0  . Nutritional Supplements (FEEDING SUPPLEMENT, OSMOLITE 1.5 CAL,) LIQD Begin Osmolite 1.5 via PEG.  Advance as tolerated to goal rate of 7 cartons daily in 4 feedings with 120 mL of free water before and after each bolus feeding.  In addition mix 30 mL Prosource with 60 mL of water 4 times daily and infuse into PEG with 60 mL free water after each Prosource administration.  Drink an additional 10 ounces of water daily or put into PEG.  If it provides 2885 cal, 144.3 g protein and 2887 mL free water/100% estimated needs. (Patient not taking: Reported on 11/23/2019) 1659 mL 3  . ondansetron (ZOFRAN) 8 MG tablet Take 1 tablet (8 mg total) by mouth every 8 (eight) hours as needed for nausea or vomiting. (Patient not taking: Reported on 11/23/2019) 30 tablet 0  . promethazine (PHENERGAN) 25 MG suppository Place 1 suppository (25 mg total) rectally every 6 (six) hours as needed for nausea or vomiting. (Patient not taking: Reported on 11/16/2019) 12 suppository 1  . sodium fluoride (PREVIDENT 5000 PLUS) 1.1 % CREA dental cream Apply to tooth brush or in fluoride trays as directed.  Repeat nightly. Spit out excess-DO NOT swallow. DO NOT rinse afterwards. (Patient not taking: Reported on 10/25/2019) 2 Tube prn  . tamsulosin (FLOMAX) 0.4 MG CAPS capsule Take 1 capsule (0.4 mg total) by mouth daily. (Patient not taking: Reported on 11/09/2019) 7 capsule 0  . traZODone (DESYREL) 50 MG tablet Take 1 tablet (50 mg total) by mouth at bedtime as needed for sleep. (Patient not taking: Reported on 10/05/2019) 30 tablet 3   No current facility-administered medications for this visit.   Facility-Administered Medications Ordered in Other Visits  Medication Dose Route Frequency Provider Last Rate Last Admin  . 0.9 %  sodium  chloride infusion   Intravenous Continuous Monia Sabal, PA-C         REVIEW OF SYSTEMS:   A 10+ POINT REVIEW OF SYSTEMS WAS OBTAINED including neurology,  dermatology, psychiatry, cardiac, respiratory, lymph, extremities, GI, GU, Musculoskeletal, constitutional, breasts, reproductive, HEENT.  All pertinent positives are noted in the HPI.  All others are negative.   PHYSICAL EXAMINATION: ECOG PERFORMANCE STATUS: 1 - Symptomatic but completely ambulatory  Vitals:   11/23/19 0917  BP: 134/77  Pulse: 72  Resp: 18  Temp: 97.7 F (36.5 C)  SpO2: 98%   Filed Weights   11/23/19 0917  Weight: 270 lb (122.5 kg)   Body mass index is 41.05 kg/m.   GENERAL:alert, in no acute distress and comfortable SKIN: no acute rashes, no significant lesions EYES: conjunctiva are pink and non-injected, sclera anicteric OROPHARYNX: MMM, no exudates, no oropharyngeal erythema or ulceration NECK: supple, no JVD LYMPH:  no palpable lymphadenopathy in the cervical, axillary or inguinal regions LUNGS: clear to auscultation b/l with normal respiratory effort HEART: regular rate & rhythm ABDOMEN:  normoactive bowel sounds , non tender, not distended. Extremity: no pedal edema PSYCH: alert & oriented x 3 with fluent speech NEURO: no focal motor/sensory deficits  LABORATORY DATA:  I have reviewed the data as listed  CBC Latest Ref Rng & Units 11/23/2019 11/16/2019 11/09/2019  WBC 4.0 - 10.5 K/uL 2.8(L) 3.1(L) 3.9(L)  Hemoglobin 13.0 - 17.0 g/dL 12.0(L) 13.2 13.1  Hematocrit 39 - 52 % 35.7(L) 39.3 38.9(L)  Platelets 150 - 400 K/uL 106(L) 89(L) 119(L)    CMP Latest Ref Rng & Units 11/23/2019 11/16/2019 11/09/2019  Glucose 70 - 99 mg/dL 94 92 101(H)  BUN 6 - 20 mg/dL 18 17 16   Creatinine 0.61 - 1.24 mg/dL 1.32(H) 1.36(H) 1.32(H)  Sodium 135 - 145 mmol/L 139 136 139  Potassium 3.5 - 5.1 mmol/L 4.4 4.8 4.2  Chloride 98 - 111 mmol/L 99 99 101  CO2 22 - 32 mmol/L 29 28 28   Calcium 8.9 - 10.3 mg/dL 9.1 9.4 8.8(L)  Total Protein 6.5 - 8.1 g/dL - - -  Total Bilirubin 0.3 - 1.2 mg/dL - - -  Alkaline Phos 38 - 126 U/L - - -  AST 15 - 41 U/L - - -  ALT 0 - 44 U/L - - -      RADIOGRAPHIC STUDIES: I have personally reviewed the radiological images as listed and agreed with the findings in the report.  PET/CT 08/10/2019: IMPRESSION: 1. The 2.8 cm right level IIa lymph node has a maximum SUV of 28.4. A primary lesion is not readily seen. There is some asymmetric palatine tonsillar activity with the left tonsil having maximum SUV of 12.4 and the right 7.8, but no tonsillar mass is observed on the CT data. 2. No compelling findings of active malignancy in the chest, abdomen/pelvis, or skeleton. There is some accentuated anal activity which is likely physiologic given the lack of visible abnormality on the CT data. 3. Other imaging findings of potential clinical significance: Diffuse hepatic steatosis. Hypodense left renal lesion is probably a cyst but technically too small to characterize.   Electronically Signed   By: Van Clines M.D.   On: 08/10/2019 15:54   08/21/19 of Surgical Pathology [id number]    SURGICAL PATHOLOGY  * THIS IS AN AMENDED REPORT   CASE: [id number]  PATIENT: Dustin Leiter  Surgical Pathology Report   Amendment    Reason for Amendment #1: Other   Clinical History: squamous  cell of neck and head (cm)     FINAL MICROSCOPIC DIAGNOSIS:   A. TONSIL, RIGHT:  - Squamous cell carcinoma, basaloid, p16 positive.  - Margins not involved.  - Carcinoma focally 1 mm from surgical margin.  - See oncology table and comment.   B. TONSIL, LEFT:  - Benign tonsil.  - No malignancy identified.   C. TONGUE, RIGHT BASE, BIOPSY:  - Benign tonsillar tissue.  - No malignancy identified.   D. TONGUE, LEFT BASE, BIOPSY:  - Benign tonsillar tissue.  - No malignancy identified.   ONCOLOGY TABLE:  PHARYNX (OROPHARYNX, HYPOPHARYNX, NASOPHARYNX):  Procedure: Right and left tonsillectomy and right and left tongue base  biopsies.  Tumor Site: Right tonsil.  Tumor Laterality: Right tonsil.  Tumor Focality: Unifocal.   Tumor Size: 1 cm x 0.3 cm (3 mm) depth.  Histologic Type: Squamous cell carcinoma, basaloid type, p16 positive,  EBV negative.  Histologic Grade: Poorly differentiated.  Margins: Free of tumor. Carcinoma focally 1 mm from the nearest  surgical margin.    Distance to closest Margin: 1 mm.  Lymphovascular Invasion: Present.  Perineural Invasion: Not identified.  Regional Lymph Nodes: No lymph nodes submitted with this case. The  previous right neck lymph node needle core biopsy (MCS [id number] [phone]) shows  squamous cell carcinoma.  Pathologic Stage Classification (pTNM, AJCC 8th Edition): pT1, pN1, see  comment.    09/11/19 of Surgical Pathology Report [id number])  COMMENT:   Metastatic carcinoma is present in 3 lymph nodes - 1 level 1 lymph node,  1 level 2 lymph node (this metastatic focus also involves skeletal  muscle), and 1 level 3 lymph node (highlighted by a cytokeratin  immunohistochemical stain). Additional studies can be performed (block  A11) upon clinician request.    ASSESSMENT & PLAN:   Damiel Barthold Sr. is a 54 y.o. male with:  1. Rt Tonsilar Basaloid Squamous cell carcinoma , HPV+ve. PT1,pN1,M0. 3/49 LN+ve High risk features - LVI, extranodal extension, high risk morphology. Significant second hand smoke exposure S/p b/l tonsillectomy on 08/21/2019 S/p rt radical neck dissection   PLAN: -Discussed pt labwork today, 11/23/19; of CBC w/diff and CMP is as follows: all values are WNL except for WBC at 2.8K, RBC at 3.99, Hemoglobin at 12, HCT at 35.7, Platelets at 106K, Lymph Abs at 0.4K,  Creatinine at 1.32 11/23/19 of Magnesium at 2.1: WNL -Advised on hair loss  -Advised on possibility of depression and options to seek counseling -Advised on using tube to keep nutrition, reduce side effects of treatments, not lose muscle mass, keeping hydrated -Advised on mucosa- will get thicker until after radiation -Advised using food through tube- can help with  constipation   -Advised continue watching blood counts with chemotherapy -Recommended that the pt continue to eat well, drink at least 48-64 oz of water each day, and walk 20-30 minutes each day. -Recommends taking Lorazepam- medication only last 3-4 hours for anxiety -Recommends keeping up with nutrition and staying hydrated  -Will hold off on Cisplatin tomorrow due to ototoxicity and possible nephrotoxicity causing hematuria and per patients request. -Rx Lorazepam to use prn for nausea and anxiety -Recommend pt f/u with Ernestene Kiel as scheduled -Recommend taking OTC Vitamin B-complex supplement  -Recommends continue watching CPAP -Will see back in 2 weeks  FOLLOW UP: Plz cancel Cisplatin chemotherapy for tomorrow RTC with Dr Irene Limbo in 2 weeks with labs for toxicity monitoring and symptomatic management   All of the patient's questions were answered with apparent satisfaction. The  patient knows to call the clinic with any problems, questions or concerns.  Sullivan Lone MD MS AAHIVMS [facility] Hematology/Oncology Physician [facility]  (Office):       [phone] (Work cell):  [phone] (Fax):           [phone]  11/23/2019 1:32 PM  I, Dawayne Cirri am acting as a scribe for Dr. Sullivan Lone.   .I have reviewed the above documentation for accuracy and completeness, and I agree with the above. Brunetta Genera MD

## 2019-11-23 ENCOUNTER — Inpatient Hospital Stay: Payer: 59

## 2019-11-23 ENCOUNTER — Other Ambulatory Visit: Payer: Self-pay

## 2019-11-23 ENCOUNTER — Inpatient Hospital Stay: Payer: 59 | Admitting: Nutrition

## 2019-11-23 ENCOUNTER — Inpatient Hospital Stay (HOSPITAL_BASED_OUTPATIENT_CLINIC_OR_DEPARTMENT_OTHER): Payer: 59 | Admitting: Hematology

## 2019-11-23 ENCOUNTER — Ambulatory Visit: Payer: 59

## 2019-11-23 ENCOUNTER — Ambulatory Visit
Admission: RE | Admit: 2019-11-23 | Discharge: 2019-11-23 | Disposition: A | Discharge status: Home / Self Care | Payer: 59 | Source: Ambulatory Visit | Attending: Radiation Oncology | Admitting: Radiation Oncology

## 2019-11-23 ENCOUNTER — Encounter: Payer: 59 | Admitting: Nutrition

## 2019-11-23 VITALS — BP 134/77 | HR 72 | Temp 97.7°F | Resp 18 | Ht 68.0 in | Wt 270.0 lb

## 2019-11-23 DIAGNOSIS — Z95828 Presence of other vascular implants and grafts: Secondary | ICD-10-CM

## 2019-11-23 DIAGNOSIS — C090 Malignant neoplasm of tonsillar fossa: Secondary | ICD-10-CM | POA: Diagnosis not present

## 2019-11-23 DIAGNOSIS — C099 Malignant neoplasm of tonsil, unspecified: Secondary | ICD-10-CM

## 2019-11-23 DIAGNOSIS — Z5111 Encounter for antineoplastic chemotherapy: Secondary | ICD-10-CM

## 2019-11-23 DIAGNOSIS — Z7189 Other specified counseling: Secondary | ICD-10-CM

## 2019-11-23 LAB — CBC WITH DIFFERENTIAL/PLATELET
Abs Immature Granulocytes: 0.01 10*3/uL (ref 0.00–0.07)
Basophils Absolute: 0 10*3/uL (ref 0.0–0.1)
Basophils Relative: 1 %
Eosinophils Absolute: 0 10*3/uL (ref 0.0–0.5)
Eosinophils Relative: 2 %
HCT: 35.7 % — ABNORMAL LOW (ref 39.0–52.0)
Hemoglobin: 12 g/dL — ABNORMAL LOW (ref 13.0–17.0)
Immature Granulocytes: 0 %
Lymphocytes Relative: 14 %
Lymphs Abs: 0.4 10*3/uL — ABNORMAL LOW (ref 0.7–4.0)
MCH: 30.1 pg (ref 26.0–34.0)
MCHC: 33.6 g/dL (ref 30.0–36.0)
MCV: 89.5 fL (ref 80.0–100.0)
Monocytes Absolute: 0.3 10*3/uL (ref 0.1–1.0)
Monocytes Relative: 11 %
Neutro Abs: 2 10*3/uL (ref 1.7–7.7)
Neutrophils Relative %: 72 %
Platelets: 106 10*3/uL — ABNORMAL LOW (ref 150–400)
RBC: 3.99 MIL/uL — ABNORMAL LOW (ref 4.22–5.81)
RDW: 15.2 % (ref 11.5–15.5)
WBC: 2.8 10*3/uL — ABNORMAL LOW (ref 4.0–10.5)
nRBC: 0 % (ref 0.0–0.2)

## 2019-11-23 LAB — BASIC METABOLIC PANEL
Anion gap: 11 (ref 5–15)
BUN: 18 mg/dL (ref 6–20)
CO2: 29 mmol/L (ref 22–32)
Calcium: 9.1 mg/dL (ref 8.9–10.3)
Chloride: 99 mmol/L (ref 98–111)
Creatinine, Ser: 1.32 mg/dL — ABNORMAL HIGH (ref 0.61–1.24)
GFR calc Af Amer: >60 mL/min (ref >60)
GFR calc non Af Amer: >60 mL/min (ref >60)
Glucose, Bld: 94 mg/dL (ref 70–99)
Potassium: 4.4 mmol/L (ref 3.5–5.1)
Sodium: 139 mmol/L (ref 135–145)

## 2019-11-23 LAB — MAGNESIUM: Magnesium: 2.1 mg/dL (ref 1.7–2.4)

## 2019-11-23 MED ORDER — SODIUM CHLORIDE 0.9% FLUSH
10.0000 mL | Freq: Once | INTRAVENOUS | Status: AC
  Administered 2019-11-23: 10 mL via INTRAVENOUS
  Filled 2019-11-23: qty 10

## 2019-11-23 MED ORDER — HEPARIN SOD (PORK) LOCK FLUSH 100 UNIT/ML IV SOLN
500.0000 [IU] | Freq: Once | INTRAVENOUS | Status: DC
  Filled 2019-11-23: qty 5

## 2019-11-23 MED ORDER — HEPARIN SOD (PORK) LOCK FLUSH 100 UNIT/ML IV SOLN
500.0000 [IU] | Freq: Once | INTRAVENOUS | Status: AC
  Administered 2019-11-23: 500 [IU] via INTRAVENOUS
  Filled 2019-11-23: qty 5

## 2019-11-23 NOTE — Progress Notes (Signed)
Nutrition follow-up completed with patient after MD visit.  Patient's wife was in attendance.  Patient has been receiving concurrent chemoradiation therapy for squamous cell cancer of the head and neck. Weight decreased to 270 pounds today.  This is an 11% weight loss since the start of treatment. Patient still has not used feeding tube as directed. Wife reports he has refused. Patient reports he can drink 4 bottles of Ensure Plus a day and 3 - 16 ounce bottles of water. He is looking forward to the end of treatment.  Nutrition diagnosis: Food and nutrition related knowledge deficit continues.  Estimated nutrition needs: 2500-2700 cal, 140-150 g protein, 2.7 L fluid.  Intervention: Patient educated to continue oral intake as tolerated. Explained importance of adequate calories and protein to support healing and recovering. Recommended patient begin 1 carton Osmolite 1.5 - 3 times daily with 60 mL of free water before and after each bolus feeding. Use 30 mL Prosource mixed with 60 mL of water 4 times a day and flush with an additional 60 mL of water after administration. Continue 4 cartons of Ensure Plus daily by mouth. Gradually increase Osmolite 1.5 to goal rate of 7 cartons daily. Drink an additional 10 ounces of water daily. Tube feeding regimen provides 2885 cal, 144.3 g protein and 2887 mL of free water.  Monitoring, evaluation, goals: Patient will tolerate adequate calories and protein to minimize further weight loss and promote healing.  Next visit: Tuesday, July 20 after radiation therapy.  **Disclaimer: This note was dictated with voice recognition software. Similar sounding words can inadvertently be transcribed and this note may contain transcription errors which may not have been corrected upon publication of note.**

## 2019-11-24 ENCOUNTER — Other Ambulatory Visit: Payer: Self-pay

## 2019-11-24 ENCOUNTER — Inpatient Hospital Stay: Payer: 59

## 2019-11-24 ENCOUNTER — Ambulatory Visit
Admission: RE | Admit: 2019-11-24 | Discharge: 2019-11-24 | Disposition: A | Discharge status: Home / Self Care | Payer: 59 | Source: Ambulatory Visit | Attending: Radiation Oncology | Admitting: Radiation Oncology

## 2019-11-24 ENCOUNTER — Encounter: Payer: 59 | Admitting: Nutrition

## 2019-11-24 DIAGNOSIS — C090 Malignant neoplasm of tonsillar fossa: Secondary | ICD-10-CM | POA: Diagnosis not present

## 2019-11-27 ENCOUNTER — Ambulatory Visit
Admission: RE | Admit: 2019-11-27 | Discharge: 2019-11-27 | Disposition: A | Discharge status: Home / Self Care | Payer: 59 | Source: Ambulatory Visit | Attending: Radiation Oncology | Admitting: Radiation Oncology

## 2019-11-27 ENCOUNTER — Other Ambulatory Visit: Payer: Self-pay

## 2019-11-27 DIAGNOSIS — C090 Malignant neoplasm of tonsillar fossa: Secondary | ICD-10-CM | POA: Diagnosis not present

## 2019-11-28 ENCOUNTER — Other Ambulatory Visit: Payer: Self-pay

## 2019-11-28 ENCOUNTER — Inpatient Hospital Stay: Payer: 59 | Admitting: Nutrition

## 2019-11-28 ENCOUNTER — Ambulatory Visit
Admission: RE | Admit: 2019-11-28 | Discharge: 2019-11-28 | Disposition: A | Discharge status: Home / Self Care | Payer: 59 | Source: Ambulatory Visit | Attending: Radiation Oncology | Admitting: Radiation Oncology

## 2019-11-28 DIAGNOSIS — C090 Malignant neoplasm of tonsillar fossa: Secondary | ICD-10-CM | POA: Diagnosis not present

## 2019-11-28 NOTE — Progress Notes (Signed)
Nutrition follow-up completed with patient and wife.  Patient has almost completed chemoradiation therapy for squamous cell cancer of the head and neck. Patient reports weight decreased to 267.5 pounds today. He states he is getting a total of 4 bottles of either Osmolite 1.5 or Ensure Plus or boost plus daily.  He understands his goal is 7 cartons. He is tolerating 1 serving of Prosource every day. Reports tolerating 2-3 bottles of water. He reports his main issue right now is constipation.  He has been using MiraLAX once a day.  Reports he is getting some Dulcolax after he leaves his appointment. Thickened saliva contributes to decreased oral intake. Labs were reviewed.  Nutrition diagnosis: Food and nutrition related knowledge deficit continues.  Estimated nutrition needs: 2500-2700 cal, 140-150 g protein, 2.7 L.  Intervention: Educated patient on the importance of increasing overall nutrition to goal rate of 7 cartons of Osmolite 1.5 via feeding tube or Ensure Plus or boost plus by mouth. Explained importance of improving overall nutrition status for healing. Recommended patient begin 1-1/2 cartons of Osmolite 1.5-4 times daily today as tolerated. Encouraged increased water intake by mouth or through tube to help with constipation. Bowel regimen per MD. Hold Prosource until tube feeding at goal rate. Written information provided to patient and wife.  Questions were answered.  Teach back method used.  Monitoring, evaluation, goals: Patient will tolerate increased calories and protein to minimize further weight loss and promote healing.  Next visit: Thursday, August 5 by telephone.  **Disclaimer: This note was dictated with voice recognition software. Similar sounding words can inadvertently be transcribed and this note may contain transcription errors which may not have been corrected upon publication of note.**

## 2019-11-29 ENCOUNTER — Ambulatory Visit
Admission: RE | Admit: 2019-11-29 | Discharge: 2019-11-29 | Disposition: A | Discharge status: Home / Self Care | Payer: 59 | Source: Ambulatory Visit | Attending: Radiation Oncology | Admitting: Radiation Oncology

## 2019-11-29 ENCOUNTER — Other Ambulatory Visit: Payer: Self-pay

## 2019-11-29 DIAGNOSIS — C090 Malignant neoplasm of tonsillar fossa: Secondary | ICD-10-CM | POA: Diagnosis not present

## 2019-11-30 ENCOUNTER — Encounter: Payer: Self-pay | Admitting: Radiation Oncology

## 2019-11-30 ENCOUNTER — Ambulatory Visit
Admission: RE | Admit: 2019-11-30 | Discharge: 2019-11-30 | Disposition: A | Discharge status: Home / Self Care | Payer: 59 | Source: Ambulatory Visit | Attending: Radiation Oncology | Admitting: Radiation Oncology

## 2019-11-30 ENCOUNTER — Telehealth: Payer: Self-pay | Admitting: Hematology

## 2019-11-30 ENCOUNTER — Other Ambulatory Visit: Payer: Self-pay

## 2019-11-30 DIAGNOSIS — C090 Malignant neoplasm of tonsillar fossa: Secondary | ICD-10-CM | POA: Diagnosis not present

## 2019-11-30 NOTE — Progress Notes (Signed)
Oncology Nurse Navigator Documentation  Met with Dustin Rios after final RT to offer support and to celebrate end of radiation treatment.   Provided verbal/written post-RT guidance:  Importance of keeping all follow-up appts, especially those with Nutrition and SLP.  Importance of protecting treatment area from sun.  Continuation of Sonafine application 2-3 times daily, application of antibiotic ointment to areas of raw skin; when supply of Sonafine exhausted transition to OTC lotion with vitamin E. Provided/reviewed Epic calendar of upcoming appts. Explained my role as navigator will continue for several more months, encouraged him to call me with needs/concerns.    Harlow Asa RN, BSN, OCN Head & Neck Oncology Nurse [place] at [facility] Phone # [phone]  Fax # [phone]

## 2019-11-30 NOTE — Telephone Encounter (Signed)
Scheduled per 07/15 los, patient has been called and voicemail was left.

## 2019-12-05 ENCOUNTER — Inpatient Hospital Stay: Payer: 59

## 2019-12-05 ENCOUNTER — Other Ambulatory Visit: Payer: Self-pay

## 2019-12-05 ENCOUNTER — Inpatient Hospital Stay (HOSPITAL_BASED_OUTPATIENT_CLINIC_OR_DEPARTMENT_OTHER): Payer: 59 | Admitting: Hematology

## 2019-12-05 VITALS — BP 134/83 | HR 101 | Temp 97.7°F | Resp 18 | Ht 68.0 in | Wt 268.1 lb

## 2019-12-05 DIAGNOSIS — C099 Malignant neoplasm of tonsil, unspecified: Secondary | ICD-10-CM

## 2019-12-05 DIAGNOSIS — C090 Malignant neoplasm of tonsillar fossa: Secondary | ICD-10-CM

## 2019-12-05 DIAGNOSIS — K1233 Oral mucositis (ulcerative) due to radiation: Secondary | ICD-10-CM | POA: Diagnosis not present

## 2019-12-05 DIAGNOSIS — Z5111 Encounter for antineoplastic chemotherapy: Secondary | ICD-10-CM

## 2019-12-05 DIAGNOSIS — Z95828 Presence of other vascular implants and grafts: Secondary | ICD-10-CM

## 2019-12-05 DIAGNOSIS — Z7189 Other specified counseling: Secondary | ICD-10-CM

## 2019-12-05 LAB — CBC WITH DIFFERENTIAL/PLATELET
Abs Immature Granulocytes: 0.01 10*3/uL (ref 0.00–0.07)
Basophils Absolute: 0 10*3/uL (ref 0.0–0.1)
Basophils Relative: 0 %
Eosinophils Absolute: 0.1 10*3/uL (ref 0.0–0.5)
Eosinophils Relative: 2 %
HCT: 36.2 % — ABNORMAL LOW (ref 39.0–52.0)
Hemoglobin: 12.3 g/dL — ABNORMAL LOW (ref 13.0–17.0)
Immature Granulocytes: 0 %
Lymphocytes Relative: 13 %
Lymphs Abs: 0.4 10*3/uL — ABNORMAL LOW (ref 0.7–4.0)
MCH: 31.3 pg (ref 26.0–34.0)
MCHC: 34 g/dL (ref 30.0–36.0)
MCV: 92.1 fL (ref 80.0–100.0)
Monocytes Absolute: 0.4 10*3/uL (ref 0.1–1.0)
Monocytes Relative: 13 %
Neutro Abs: 1.9 10*3/uL (ref 1.7–7.7)
Neutrophils Relative %: 72 %
Platelets: 197 10*3/uL (ref 150–400)
RBC: 3.93 MIL/uL — ABNORMAL LOW (ref 4.22–5.81)
RDW: 16.9 % — ABNORMAL HIGH (ref 11.5–15.5)
WBC: 2.7 10*3/uL — ABNORMAL LOW (ref 4.0–10.5)
nRBC: 0 % (ref 0.0–0.2)

## 2019-12-05 LAB — CMP (CANCER CENTER ONLY)
ALT: 17 U/L (ref 0–44)
AST: 14 U/L — ABNORMAL LOW (ref 15–41)
Albumin: 3.6 g/dL (ref 3.5–5.0)
Alkaline Phosphatase: 89 U/L (ref 38–126)
Anion gap: 6 (ref 5–15)
BUN: 17 mg/dL (ref 6–20)
CO2: 31 mmol/L (ref 22–32)
Calcium: 9.9 mg/dL (ref 8.9–10.3)
Chloride: 101 mmol/L (ref 98–111)
Creatinine: 1.06 mg/dL (ref 0.61–1.24)
GFR, Est AFR Am: >60 mL/min (ref >60)
GFR, Estimated: >60 mL/min (ref >60)
Glucose, Bld: 134 mg/dL — ABNORMAL HIGH (ref 70–99)
Potassium: 4.1 mmol/L (ref 3.5–5.1)
Sodium: 138 mmol/L (ref 135–145)
Total Bilirubin: 0.6 mg/dL (ref 0.3–1.2)
Total Protein: 6.9 g/dL (ref 6.5–8.1)

## 2019-12-05 LAB — PHOSPHORUS: Phosphorus: 4 mg/dL (ref 2.5–4.6)

## 2019-12-05 LAB — MAGNESIUM: Magnesium: 2.1 mg/dL (ref 1.7–2.4)

## 2019-12-05 MED ORDER — HEPARIN SOD (PORK) LOCK FLUSH 100 UNIT/ML IV SOLN
500.0000 [IU] | Freq: Once | INTRAVENOUS | Status: AC
  Administered 2019-12-05: 500 [IU] via INTRAVENOUS
  Filled 2019-12-05: qty 5

## 2019-12-05 MED ORDER — LORAZEPAM 0.5 MG PO TABS: 0.5000 mg | ORAL_TABLET | Freq: Three times a day (TID) | ORAL | 0 refills | Status: DC | PRN

## 2019-12-05 MED ORDER — SODIUM CHLORIDE 0.9% FLUSH
10.0000 mL | Freq: Once | INTRAVENOUS | Status: AC
  Administered 2019-12-05: 10 mL via INTRAVENOUS
  Filled 2019-12-05: qty 10

## 2019-12-05 NOTE — Progress Notes (Signed)
HEMATOLOGY/ONCOLOGY CLINIC NOTE  Date of Service: 12/05/2019  Patient Care Team: Claretta Fraise, MD as PCP - General (Family Medicine) Jodi Marble, MD as Consulting Physician (Otolaryngology) Eppie Gibson, MD as Attending Physician (Radiation Oncology) Tish Men, MD (Inactive) as Consulting Physician (Hematology) Leota Sauers, RN (Inactive) as Oncology Nurse Navigator [place], Stephani Police, RN as Oncology Nurse Navigator (Oncology) Karie Mainland, RD as Dietitian (Nutrition)  REFERRING PHYSICIAN: Claretta Fraise, MD  CHIEF COMPLAINTS/PURPOSE OF CONSULTATION:  Squamous Cell Carcinoma of the Head and Neck  HISTORY OF PRESENTING ILLNESS:   Dustin Leiter Sr. is a wonderful 54 y.o. male who has been referred to [place] by Claretta Fraise, MD for evaluation and management of Squamous Cell Carcinoma of the Head and Neck. Pt is accompanied today by [name], wife. The pt reports that he is doing well overall.   The pt reports he is fine. Pt has had both of his COVID19 vaccine. He is healing well from surgery. Some of the nerves were damaged and he has been working it. Part of his salivary gland on the right side was taken out and it burns when he starts to eat. Pt has seen swallowing evaluation today prior to coming. He is back to eating whatever he wants to now. When swallowing he has a burning sensation. Pt has a sleep apnea machine that dries out his throat. Pt also cannot open mouth completely since the dissection.  Pt had a biopsy on March 17th and found it was a squamous cell carcinoma. From his surgery he has various burning, tingling and numbness around his head, neck and shoulder.   Pt has sleep apnea. He also had gout but stopped drinking tea and has not had a flair up since the end of last year. He has no other major medical problems and works with Forensic psychologist. He has had slight hearing loss over the years due to job and being in the [employer]. Pt has never smoked, but his  mother and brohter smoked a lot and pt was exposed to second hand smoke for about 23 years. Many of the pt's family members especially from his mother side have had cancer's that were very aggressive and they passed at early ages.   Of note prior to the patient's visit today, pt has had Surgical Pathology ([id number]) completed on 09/11/19 with results revealing "LYMPH NODE, MODIFIED RADICAL NECK I-IV, REGIONAL DISSECTION: Metastatic carcinoma in 3 of 49 lymph nodes (3/49), with extracapsular extension." Pt has had Surgical Pathology ([id number]) completed on 08/21/19 with results revealing "A. TONSIL, RIGHT: Squamous cell carcinoma, basaloid, p16 positive.  Margins not involved.  Carcinoma focally 1 mm from surgical margin.  See oncology table and comment.  B. TONSIL, LEFT:  Benign tonsil.  No malignancy identified. C. TONGUE, RIGHT BASE, BIOPSY:  Benign tonsillar tissue. No malignancy identified. D. TONGUE, LEFT BASE, BIOPSY: Benign tonsillar tissue.  No malignancy identified. "  Pt has had PET Skull Base to Thigh ([id number]) completed on 08/10/19 with results revealing "1. The 2.8 cm right level IIa lymph node has a maximum SUV of 28.4. A primary lesion is not readily seen. There is some asymmetric palatine tonsillar activity with the left tonsil having maximum SUV of 12.4 and the right 7.8, but no tonsillar mass is observed on the CT data. 2. No compelling findings of active malignancy in the chest, abdomen/pelvis, or skeleton. There is some accentuated anal activity which is likely physiologic given the lack of visible abnormality on the  CT data. 3. Other imaging findings of potential clinical significance: Diffuse hepatic steatosis. Hypodense left renal lesion is probably a cyst but technically too small to characterize." Pt has had [place] Core Biopsy ([id number]) completed on  07/26/19 with results revealing "Successful ultrasound-guided right cervical lymph node/lesion Biopsy." Pt has had of CT  Soft Tissue Neck ([id number]) completed on 07/13/19 with results revealing "4.1 x 3.2 cm necrotic/cystic node or nodal conglomerate at the right level II station likely reflecting nodal metastatic disease. No definite primary mass is identified within the oral cavity, pharynx or larynx. Correlate with direct visualization and consider direct tissue sampling. Additionally, PET-CT may be helpful. Cervical spondylosis as described. A prominent C6-C7 posterior disc osteophyte contributes to at least moderate bony spinal canal stenosis. Multilevel neural foraminal narrowing."  Most recent lab results (09/11/19) of CBC is as follows: all values are WNL except for Glucose at 100, Calcium at 8.7  On review of systems, pt reports various tingling, numbness, burnings sensations around head neck and shoulder, weight loss, insomnia and denies back pain, abdominal pain and any other symptoms.   On PMHx the pt reports tonsillectomy 08/21/19, radical neck dissection 09/11/19, sleep apnea, gout, wisdom tooth extraction, carpal tunnel surgery    On Social Hx the pt reports never smoked, smoked marijuana 30 years ago, works Forensic psychologist, pt was in [employer]   On Family Hx the pt reports brother metastatic lung cancer at age 66, [place] had liver cancer and pancreatic cancer in early 65's of age, mother has uterine cancer at age 11, grandmother on fathers side has stomach cancer, great grandmother had lung cancer    INTERVAL HISTORY: Dustin Knoth Sr. is a wonderful 54 y.o. male who is here for evaluation and management of Squamous Cell Carcinoma of the Head and Neck. We are joined today by his wife. The patient's last visit with [place] was on 11/23/2019. The pt reports that he is doing well overall.  The pt reports that he is still experiencing throat soreness and using salt/baking soda mouth rinses regularly. His throat pain is improved with Chloraseptic mouthwash, which he takes before bed. He is still able  to drink water, but is getting all of his nutrition through his tube. Pt vomited last night after becoming choked while brushing his teeth. Pt has irritated skin on his neck and has been using Sonafine on the area. He is having near constant nausea.  Lab results today (12/05/19) of CBC w/diff and CMP is as follows: all values are WNL except for WBC at 2.7K, RBC at 3.93, Hgb at 12.3, HCT at 36.2, RDW at 16.9, Lymphs Abs at 0.4K, Glucose at 134, AST at 14. 12/05/2019 Magnesium at 2.1 12/05/2019 Phosphorus at 4.0  On review of systems, pt reports chills, sore throat, nausea, fatigue, muscle soreness, vomiting, skin irritation, sleeplessness, weight loss and denies fevers, mouth sores, constipation, hematuria, dysuria, abdominal pain, pain at port site and any other symptoms.   MEDICAL HISTORY:  Past Medical History:  Diagnosis Date  . Arthritis   . Change in stool 02/22/2017  . Gout 02/22/2017  . Hyperlipidemia   . Sleep apnea    Uses CPAP  . Tonsil cancer [facility])    metastatic     SURGICAL HISTORY: Past Surgical History:  Procedure Laterality Date  . CARPAL TUNNEL RELEASE Right 04/10/2019  . DIRECT LARYNGOSCOPY N/A 08/21/2019   Procedure: DIRECT LARYNGOSCOPY - WITH BX;  Surgeon: [name], MD;  Location: [facility];  Service: ENT;  Laterality: N/A;  . IR GASTROSTOMY TUBE MOD SED  10/13/2019  . IR IMAGING GUIDED PORT INSERTION  10/13/2019  . NECK DISSECTION  09/11/2019  . RADICAL NECK DISSECTION Right 09/11/2019   Procedure: RADICAL NECK DISSECTION;  Surgeon: [name], MD;  Location: [place];  Service: ENT;  Laterality: Right;  . TONSILLECTOMY Bilateral 08/21/2019   Procedure: TONSILLECTOMY;  Surgeon: [name], MD;  Location: [place];  Service: ENT;  Laterality: Bilateral;  . WISDOM TOOTH EXTRACTION       SOCIAL HISTORY: Social History   Socioeconomic History  . Marital status: Married    Spouse name: Not on file  . Number of children: 1  .  Years of education: Not on file  . Highest education level: Not on file  Occupational History  . Not on file  Tobacco Use  . Smoking status: Never Smoker  . Smokeless tobacco: Never Used  Vaping Use  . Vaping Use: Never used  Substance and Sexual Activity  . Alcohol use: No  . Drug use: No  . Sexual activity: Not on file  Other Topics Concern  . Not on file  Social History Narrative   Patient is married and has 1 son.   Patient has never smoked, used tobacco products.   Patient does not drink.  Patient does not use illicit drugs.   Social Determinants of Health   Financial Resource Strain:   . Difficulty of Paying Living Expenses:   Food Insecurity:   . Worried About Charity fundraiser in the Last Year:   . Arboriculturist in the Last Year:   Transportation Needs:   . Film/video editor (Medical):   Marland Kitchen Lack of Transportation (Non-Medical):   Physical Activity:   . Days of Exercise per Week:   . Minutes of Exercise per Session:   Stress:   . Feeling of Stress :   Social Connections:   . Frequency of Communication with Friends and Family:   . Frequency of Social Gatherings with Friends and Family:   . Attends Religious Services:   . Active Member of Clubs or Organizations:   . Attends Archivist Meetings:   Marland Kitchen Marital Status:   Intimate Partner Violence:   . Fear of Current or Ex-Partner:   . Emotionally Abused:   Marland Kitchen Physically Abused:   . Sexually Abused:      FAMILY HISTORY: Family History  Problem Relation Age of Onset  . COPD Mother   . Uterine cancer Mother   . Lung cancer Brother   . Lung cancer Maternal Grandfather   . Stomach cancer Paternal Grandmother      ALLERGIES:   has No Known Allergies.   MEDICATIONS:  Current Outpatient Medications  Medication Sig Dispense Refill  . Colchicine (MITIGARE) 0.6 MG CAPS Take 2 capsules at onset of flare. Then take 1 capsule 1 hour later.  May take 1 capsule up to twice daily thereafter.  (Patient not taking: Reported on 09/28/2019) 60 capsule 2  . HYDROcodone-acetaminophen (HYCET) 7.5-325 mg/15 ml solution Take 15 mLs by mouth 4 (four) times daily as needed for moderate pain. (Patient not taking: Reported on 09/28/2019) 420 mL 0  . HYDROcodone-acetaminophen (NORCO/VICODIN) 5-325 MG tablet Take 1 tablet by mouth every 6 (six) hours as needed for moderate pain. 30 tablet 0  . lidocaine (XYLOCAINE) 2 % solution Patient: Mix 1part 2% viscous lidocaine, 1part H20. Swish & swallow 77mL of diluted mixture, 59min before meals and  at bedtime, up to QID 200 mL 2  . lidocaine-prilocaine (EMLA) cream     . loratadine (EQ LORATADINE) 10 MG tablet Take 10 mg by mouth daily.     Marland Kitchen LORazepam (ATIVAN) 0.5 MG tablet Take 1-2 tablets (0.5-1 mg total) by mouth every 8 (eight) hours as needed for anxiety or sleep (chemotherapy related nausea). 60 tablet 0  . magic mouthwash w/lidocaine SOLN Take 5 mLs by mouth 4 (four) times daily as needed for mouth pain. 80 ml viscous lidocaine 2% 80 ml Mylanta 80 ml diphenhydramine 12.5 mg per 5 ml elixir 80 ml nystatin 100,000U suspension 80 ml prednisolone 15mg  per 58ml solution 80 ml distilled water Disp: 480 ml 480 mL 1  . Nutritional Supplements (FEEDING SUPPLEMENT, OSMOLITE 1.5 CAL,) LIQD Begin Osmolite 1.5 via PEG.  Advance as tolerated to goal rate of 7 cartons daily in 4 feedings with 120 mL of free water before and after each bolus feeding.  In addition mix 30 mL Prosource with 60 mL of water 4 times daily and infuse into PEG with 60 mL free water after each Prosource administration.  Drink an additional 10 ounces of water daily or put into PEG.  If it provides 2885 cal, 144.3 g protein and 2887 mL free water/100% estimated needs. (Patient not taking: Reported on 11/23/2019) 1659 mL 3  . ondansetron (ZOFRAN) 8 MG tablet Take 1 tablet (8 mg total) by mouth every 8 (eight) hours as needed for nausea or vomiting. (Patient not taking: Reported on 11/23/2019) 30 tablet  0  . pantoprazole (PROTONIX) 40 MG tablet Take 1 tablet (40 mg total) by mouth daily before breakfast. 30 tablet 2  . Polyethylene Glycol 3350 (MIRALAX PO) Take 17 g by mouth daily. Takes 17 g powder mixed in liquid (up to mark on cap) daily    . prochlorperazine (COMPAZINE) 10 MG tablet Take 1 tablet (10 mg total) by mouth every 6 (six) hours as needed for nausea or vomiting. 30 tablet 0  . promethazine (PHENERGAN) 25 MG suppository Place 1 suppository (25 mg total) rectally every 6 (six) hours as needed for nausea or vomiting. (Patient not taking: Reported on 11/16/2019) 12 suppository 1  . senna-docusate (SENNA S) 8.6-50 MG tablet Take 2 tablets by mouth at bedtime as needed for mild constipation or moderate constipation. 60 tablet 1  . sodium fluoride (FLUORISHIELD) 1.1 % GEL dental gel APPLY TO TOOTHBRUSH OR IN FLUORIDE TRAYS AS DIRECTED. REPEAT NIGHTLY. SPIT OUT EXCESS DO NOT SWALLOW. DO NOT RINSE AFTERWARDS    . sodium fluoride (PREVIDENT 5000 PLUS) 1.1 % CREA dental cream Apply to tooth brush or in fluoride trays as directed.  Repeat nightly. Spit out excess-DO NOT swallow. DO NOT rinse afterwards. (Patient not taking: Reported on 10/25/2019) 2 Tube prn  . tamsulosin (FLOMAX) 0.4 MG CAPS capsule Take 1 capsule (0.4 mg total) by mouth daily. (Patient not taking: Reported on 11/09/2019) 7 capsule 0  . traZODone (DESYREL) 50 MG tablet Take 1 tablet (50 mg total) by mouth at bedtime as needed for sleep. (Patient not taking: Reported on 10/05/2019) 30 tablet 3   No current facility-administered medications for this visit.   Facility-Administered Medications Ordered in Other Visits  Medication Dose Route Frequency Provider Last Rate Last Admin  . 0.9 %  sodium chloride infusion   Intravenous Continuous Monia Sabal, PA-C         REVIEW OF SYSTEMS:   A 10+ POINT REVIEW OF SYSTEMS WAS OBTAINED including neurology, dermatology, psychiatry,  cardiac, respiratory, lymph, extremities, GI, GU,  Musculoskeletal, constitutional, breasts, reproductive, HEENT.  All pertinent positives are noted in the HPI.  All others are negative.   PHYSICAL EXAMINATION: ECOG PERFORMANCE STATUS: 1 - Symptomatic but completely ambulatory  Vitals:   12/05/19 1327  BP: (!) 134/83  Pulse: 101  Resp: 18  Temp: 97.7 F (36.5 C)  SpO2: 97%   Filed Weights   12/05/19 1327  Weight: (!) 268 lb 1.6 oz (121.6 kg)   Body mass index is 40.76 kg/m.   GENERAL:alert, in no acute distress and comfortable SKIN: Healing blister on chin likely due to pressure injury from CPAP mask strap. Desquamation b/l neck with 3 cm skin break that's healing on the left side.  EYES: conjunctiva are pink and non-injected, sclera anicteric OROPHARYNX: MMM, no exudates, no oropharyngeal erythema or ulceration NECK: supple, no JVD LYMPH:  no palpable lymphadenopathy in the cervical, axillary or inguinal regions LUNGS: clear to auscultation b/l with normal respiratory effort HEART: regular rate & rhythm ABDOMEN:  normoactive bowel sounds , non tender, not distended. No palpable hepatosplenomegaly.  Extremity: no pedal edema PSYCH: alert & oriented x 3 with fluent speech NEURO: no focal motor/sensory deficits  LABORATORY DATA:  I have reviewed the data as listed  CBC Latest Ref Rng & Units 12/05/2019 11/23/2019 11/16/2019  WBC 4.0 - 10.5 K/uL 2.7(L) 2.8(L) 3.1(L)  Hemoglobin 13.0 - 17.0 g/dL 12.3(L) 12.0(L) 13.2  Hematocrit 39 - 52 % 36.2(L) 35.7(L) 39.3  Platelets 150 - 400 K/uL 197 106(L) 89(L)    CMP Latest Ref Rng & Units 12/05/2019 11/23/2019 11/16/2019  Glucose 70 - 99 mg/dL 134(H) 94 92  BUN 6 - 20 mg/dL 17 18 17   Creatinine 0.61 - 1.24 mg/dL 1.06 1.32(H) 1.36(H)  Sodium 135 - 145 mmol/L 138 139 136  Potassium 3.5 - 5.1 mmol/L 4.1 4.4 4.8  Chloride 98 - 111 mmol/L 101 99 99  CO2 22 - 32 mmol/L 31 29 28   Calcium 8.9 - 10.3 mg/dL 9.9 9.1 9.4  Total Protein 6.5 - 8.1 g/dL 6.9 - -  Total Bilirubin 0.3 - 1.2 mg/dL 0.6  - -  Alkaline Phos 38 - 126 U/L 89 - -  AST 15 - 41 U/L 14(L) - -  ALT 0 - 44 U/L 17 - -     RADIOGRAPHIC STUDIES: I have personally reviewed the radiological images as listed and agreed with the findings in the report.  PET/CT 08/10/2019: IMPRESSION: 1. The 2.8 cm right level IIa lymph node has a maximum SUV of 28.4. A primary lesion is not readily seen. There is some asymmetric palatine tonsillar activity with the left tonsil having maximum SUV of 12.4 and the right 7.8, but no tonsillar mass is observed on the CT data. 2. No compelling findings of active malignancy in the chest, abdomen/pelvis, or skeleton. There is some accentuated anal activity which is likely physiologic given the lack of visible abnormality on the CT data. 3. Other imaging findings of potential clinical significance: Diffuse hepatic steatosis. Hypodense left renal lesion is probably a cyst but technically too small to characterize.   Electronically Signed   By: Van Clines M.D.   On: 08/10/2019 15:54   08/21/19 of Surgical Pathology [id number]    SURGICAL PATHOLOGY  * THIS IS AN AMENDED REPORT   CASE: [id number]  PATIENT: [facility]  Surgical Pathology Report   Amendment    Reason for Amendment #1: Other   Clinical History: squamous cell of neck and  head (cm)     FINAL MICROSCOPIC DIAGNOSIS:   A. TONSIL, RIGHT:  - Squamous cell carcinoma, basaloid, p16 positive.  - Margins not involved.  - Carcinoma focally 1 mm from surgical margin.  - See oncology table and comment.   B. TONSIL, LEFT:  - Benign tonsil.  - No malignancy identified.   C. TONGUE, RIGHT BASE, BIOPSY:  - Benign tonsillar tissue.  - No malignancy identified.   D. TONGUE, LEFT BASE, BIOPSY:  - Benign tonsillar tissue.  - No malignancy identified.   ONCOLOGY TABLE:  PHARYNX (OROPHARYNX, HYPOPHARYNX, NASOPHARYNX):  Procedure: Right and left tonsillectomy and right and left tongue base  biopsies.   Tumor Site: Right tonsil.  Tumor Laterality: Right tonsil.  Tumor Focality: Unifocal.  Tumor Size: 1 cm x 0.3 cm (3 mm) depth.  Histologic Type: Squamous cell carcinoma, basaloid type, p16 positive,  EBV negative.  Histologic Grade: Poorly differentiated.  Margins: Free of tumor. Carcinoma focally 1 mm from the nearest  surgical margin.    Distance to closest Margin: 1 mm.  Lymphovascular Invasion: Present.  Perineural Invasion: Not identified.  Regional Lymph Nodes: No lymph nodes submitted with this case. The  previous right neck lymph node needle core biopsy (MCS [id number] [phone]) shows  squamous cell carcinoma.  Pathologic Stage Classification (pTNM, AJCC 8th Edition): pT1, pN1, see  comment.    09/11/19 of Surgical Pathology Report [id number])  COMMENT:   Metastatic carcinoma is present in 3 lymph nodes - 1 level 1 lymph node,  1 level 2 lymph node (this metastatic focus also involves skeletal  muscle), and 1 level 3 lymph node (highlighted by a cytokeratin  immunohistochemical stain). Additional studies can be performed (block  A11) upon clinician request.    ASSESSMENT & PLAN:   Shaheim Mahar Sr. is a 54 y.o. male with:  1. Rt Tonsilar Basaloid Squamous cell carcinoma , HPV+ve. PT1,pN1,M0. 3/49 LN+ve High risk features - LVI, extranodal extension, high risk morphology. Significant second hand smoke exposure S/p b/l tonsillectomy on 08/21/2019 S/p rt radical neck dissection   PLAN: -Discussed pt labwork today, 12/05/19; WBC, Hgb, & PLT look good; blood chemistries are okay, Magnesium & Phosphorus are WNL. -Recommended that the pt continue to eat well, drink at least 48-64 oz of water each day, and walk 20-30 minutes each day.  -Advised pt that symptoms tend to begin improving 2 weeks after the last Radiation treatment.  -Advised pt that sleep deprivation and anxiety increases nausea. Advised pt that Lorazepam helps with nausea and anxiety.  -Discussed using  Marinol or a Scopolamine patch to improve nausea. Pt will use Lorazepam prn for now.  -Recommend pt avoid using blades to shave his face and neck to prevent further skin irritation.  -Recommend pt continue to f/u with Ernestene Kiel as needed -Recommend pt f/u with Dr. Isidore Moos as scheduled -Refill Lorazepam -Will see back in 3 weeks with labs     FOLLOW UP: RTC with Dr Irene Limbo with portflush and labs in 3 weeks   The total time spent in the appt was 20 minutes and more than 50% was on counseling and direct patient cares.  All of the patient's questions were answered with apparent satisfaction. The patient knows to call the clinic with any problems, questions or concerns.   Sullivan Lone MD [place] AAHIVMS [facility] Hematology/Oncology Physician [facility]  (Office):       [phone] (Work cell):  [phone] (Fax):  [phone]  12/05/2019 3:09 PM  I, Yevette Edwards, am acting as a scribe for Dr. Sullivan Lone.   .I have reviewed the above documentation for accuracy and completeness, and I agree with the above. Brunetta Genera MD

## 2019-12-11 ENCOUNTER — Telehealth: Payer: Self-pay | Admitting: Hematology

## 2019-12-11 NOTE — Telephone Encounter (Signed)
Scheduled appt per 8/2 sch msg - pt aware of appt added.

## 2019-12-13 ENCOUNTER — Telehealth: Payer: Self-pay | Admitting: *Deleted

## 2019-12-13 NOTE — Telephone Encounter (Signed)
Faxed Office visit notes (12/05/19) with note regarding current work status signed byDr. Irene Limbo to [phone] (attn [place]. Phone [phone]. Fax confirmation received 12/11/19. Original sent to HIM to be scanned.

## 2019-12-14 ENCOUNTER — Telehealth: Payer: Self-pay | Admitting: Nutrition

## 2019-12-14 ENCOUNTER — Ambulatory Visit: Payer: 59 | Attending: Radiation Oncology

## 2019-12-14 ENCOUNTER — Inpatient Hospital Stay: Payer: 59 | Attending: Hematology | Admitting: Nutrition

## 2019-12-14 ENCOUNTER — Other Ambulatory Visit: Payer: Self-pay

## 2019-12-14 ENCOUNTER — Other Ambulatory Visit: Payer: Self-pay | Admitting: Hematology

## 2019-12-14 DIAGNOSIS — Z808 Family history of malignant neoplasm of other organs or systems: Secondary | ICD-10-CM | POA: Insufficient documentation

## 2019-12-14 DIAGNOSIS — Z79899 Other long term (current) drug therapy: Secondary | ICD-10-CM | POA: Insufficient documentation

## 2019-12-14 DIAGNOSIS — G4730 Sleep apnea, unspecified: Secondary | ICD-10-CM | POA: Insufficient documentation

## 2019-12-14 DIAGNOSIS — Z801 Family history of malignant neoplasm of trachea, bronchus and lung: Secondary | ICD-10-CM | POA: Insufficient documentation

## 2019-12-14 DIAGNOSIS — Z800 Family history of malignant neoplasm of digestive organs: Secondary | ICD-10-CM | POA: Insufficient documentation

## 2019-12-14 DIAGNOSIS — R1310 Dysphagia, unspecified: Secondary | ICD-10-CM | POA: Insufficient documentation

## 2019-12-14 DIAGNOSIS — M109 Gout, unspecified: Secondary | ICD-10-CM | POA: Insufficient documentation

## 2019-12-14 DIAGNOSIS — M129 Arthropathy, unspecified: Secondary | ICD-10-CM | POA: Insufficient documentation

## 2019-12-14 DIAGNOSIS — C099 Malignant neoplasm of tonsil, unspecified: Secondary | ICD-10-CM | POA: Insufficient documentation

## 2019-12-14 DIAGNOSIS — E785 Hyperlipidemia, unspecified: Secondary | ICD-10-CM | POA: Insufficient documentation

## 2019-12-14 MED ORDER — ONDANSETRON HCL 8 MG PO TABS: 8.0000 mg | ORAL_TABLET | Freq: Three times a day (TID) | ORAL | 0 refills | Status: DC | PRN

## 2019-12-14 NOTE — Telephone Encounter (Signed)
Nutrition follow-up completed with patient over the telephone. He reports he continues to have good days and bad days. He has completed treatment for squamous cell cancer of the head neck. He is tolerating 6 cartons of Osmolite 1.5 daily.  He is using his feeding tube.  He will occasionally drink a boost plus by mouth but states they are very thick. He is not using Prosource. He can drink 1 bottle of water a day by mouth and puts remaining water needed through his feeding tube. Constipation has resolved by using prune juice. He still has occasional nausea which is controlled with medication. Last weight documented as 268.1 pounds on July 27.  Nutrition diagnosis: Food and nutrition related knowledge deficit continues.  Estimated nutrition needs: 2500-2700 cal, 140-150 g protein, 2.7 L fluid.  Intervention: Patient will continue to use 6-7 cartons of Osmolite 1.5 or boost plus. Encourage patient to continue free water flushes and water by mouth. Continue bowel regimen. Recommended patient begin trying to add nonacidic liquids and soft smooth thin foods gradually over the next few weeks.  Monitoring, evaluation, goals: Patient will tolerate adequate calories and protein to minimize weight loss and improve healing.  Next visit: Wednesday, September 1, face-to-face.  **Disclaimer: This note was dictated with voice recognition software. Similar sounding words can inadvertently be transcribed and this note may contain transcription errors which may not have been corrected upon publication of note.**

## 2019-12-14 NOTE — Therapy (Signed)
[facility] [address] [place], [place], [address] Phone: [phone]   Fax:  [phone]  Speech Language Pathology Treatment  Patient Details  Name: Dustin Desroches Sr. MRN: [id number] Date of Birth: 21-Jun-1965 Referring Provider (SLP): Eppie Gibson, MD   Encounter Date: 12/14/2019   End of Session - 12/14/19 1623    Visit Number 3    Number of Visits 7    Date for SLP Re-Evaluation 03/29/20    SLP Start Time 1533    SLP Stop Time  1606    SLP Time Calculation (min) 33 min    Activity Tolerance Patient tolerated treatment well           Past Medical History:  Diagnosis Date  . Arthritis   . Change in stool 02/22/2017  . Gout 02/22/2017  . Hyperlipidemia   . Sleep apnea    Uses CPAP  . Tonsil cancer [facility])    metastatic    Past Surgical History:  Procedure Laterality Date  . CARPAL TUNNEL RELEASE Right 04/10/2019  . DIRECT LARYNGOSCOPY N/A 08/21/2019   Procedure: DIRECT LARYNGOSCOPY - WITH BX;  Surgeon: [name], MD;  Location: [facility];  Service: ENT;  Laterality: N/A;  . IR GASTROSTOMY TUBE MOD SED  10/13/2019  . IR IMAGING GUIDED PORT INSERTION  10/13/2019  . NECK DISSECTION  09/11/2019  . RADICAL NECK DISSECTION Right 09/11/2019   Procedure: RADICAL NECK DISSECTION;  Surgeon: [name], MD;  Location: [facility];  Service: ENT;  Laterality: Right;  . TONSILLECTOMY Bilateral 08/21/2019   Procedure: TONSILLECTOMY;  Surgeon: [name], MD;  Location: [facility];  Service: ENT;  Laterality: Bilateral;  . WISDOM TOOTH EXTRACTION      There were no vitals filed for this visit.   Subjective Assessment - 12/14/19 1538    Currently in Pain? Yes    Pain Score 2     Pain Location Throat    Pain Orientation Mid    Pain Descriptors / Indicators Sore    Pain Type Acute pain    Pain Onset 1 to 4 weeks ago    Pain Frequency Constant                 ADULT SLP  TREATMENT - 12/14/19 1544      General Information   Behavior/Cognition Alert;Cooperative      Treatment Provided   Treatment provided Dysphagia      Dysphagia Treatment   Temperature Spikes Noted No    Respiratory Status Room air    Type of PO's observed Thin liquids    Oral Phase Signs & Symptoms --   none observed or reported today   Pharyngeal Phase Signs & Symptoms Delayed cough   After 60-90 seconds, likely due to thick saliva   Other treatment/comments pt was independnet with all exercises but in past three weeks has only done tongue press intermittently due to nausea and pain in throat. Today, he told SLP rationale for HEP, and after explanation by SLP re: food journal told SLP why this could hasten return to more normalized diet - said "Maybe I'll try to have some watermelon or applesauce this weekend." SLP encouaged pt to do so and to write it in food journal. SLP reitereated to pt to do non-swallowing exercises due to nausea and pain (Shaker, pitch raise, and chin tuck against resistance) - pt stated he recalled this from evaluation.       Assessment /  Recommendations / Plan   Plan Continue with current plan of care      Dysphagia Recommendations   Diet recommendations --   as tolerated   Medication Administration --   as tolerated     Progression Toward Goals   Progression toward goals Progressing toward goals            SLP Education - 12/14/19 1622    Education Details food journal, do non swallowing exercises but cont to try swallowing exericses each day    Person(s) Educated Patient    Methods Explanation    Comprehension Verbalized understanding            SLP Short Term Goals - 12/14/19 1556      SLP SHORT TERM GOAL #1   Title Pt will demo knowledge of rationale for HEP completion    Period --   sessions, for all STGs   Status Achieved      SLP SHORT TERM GOAL #2   Title Pt will complete HEP with rare min A over two sessions    Baseline 11-09-19,  12-14-19    Status Achieved      SLP SHORT TERM GOAL #3   Title Pt will tell SLP how a food journal can hasten/facilitate return to more normalized diet in 2 sessions    Baseline 12-14-19    Time 2    Status Revised      SLP SHORT TERM GOAL #4   Title Pt will tell SLP 3 overt s/s aspiration PNA with modified independence    Status Achieved            SLP Long Term Goals - 12/14/19 1557      SLP LONG TERM GOAL #1   Title Pt will complete HEP with modified independence over 3 visits    Baseline 11-09-19    Time 3    Period --   sessions, for all LTGs   Status On-going      SLP LONG TERM GOAL #2   Title Pt will tell SLP when to decr frequency of HEP    Time 3    Status On-going            Plan - 12/14/19 1623    Clinical Impression Statement Pt presents today with WNL/WFL swallowing ability with water - delayed cough (60-90 seconds likely due to thickened saliva. Pt POs mostly water daily, and occasional Boost. No overt s/sx aspiration PNA reported or observed today. Data suggests that as pts progress through rad or chemorad therapy that their swallowing ability will decrease. Also, WNL swallowing is threatened by muscle fibrosis that will likely develop after rad/chemorad is completed. Skilled ST would cont to be beneficial to the pt in order to regularly assess pt's safety with POs and/or need for instrumental swallow assessment, as well as to assess accurate completion of swallowing HEP.    Speech Therapy Frequency --   once approx every 4 weeks   Duration --   7 total sessions   Treatment/Interventions Aspiration precaution training;Pharyngeal strengthening exercises;Diet toleration management by SLP;Trials of upgraded texture/liquids;Patient/family education;SLP instruction and feedback    Potential to [place] provided today    Consulted and Agree with Plan of Care Patient           Patient will benefit from skilled therapeutic  intervention in order to improve the following deficits and impairments:   Dysphagia, unspecified type  Problem List Patient Active Problem List   Diagnosis Date Noted  . Counseling regarding advance care planning and goals of care 10/12/2019  . Malignant neoplasm of tonsillar fossa ([place]) 09/11/2019  . S/P tonsillectomy 08/21/2019  . Squamous cell carcinoma of head and neck ([place]) 08/10/2019  . Morbid obesity ([place]) 06/08/2019  . Moderate mixed hyperlipidemia not requiring statin therapy 06/08/2019  . OSA (obstructive sleep apnea) 06/08/2019  . Change in stool 02/22/2017  . Gout 02/22/2017    Laconya Clere ,MS, CCC-SLP  12/14/2019, 4:25 PM  [facility] [address] [place], [place], [address] Phone: [phone]   Fax:  [phone]   Name: Dustin Savarese Sr. MRN: [id number] Date of Birth: 1966-03-26

## 2019-12-14 NOTE — Progress Notes (Signed)
Mr. Dustin Rios presents today for follow-up of radiation to his right tonsil, completed on 11/30/2019  Pain issues, if any: Sore throat and skin very sore on right side of neck Using a feeding tube?: He is tolerating 6 cartons of Osmolite 1.5 daily. He can drink about 1 bottle of water a day by mouth and puts remaining water needed through his feeding tube Weight changes, if any:  Wt Readings from Last 3 Encounters:  12/15/19 264 lb 6.4 oz (119.9 kg)  12/05/19 (!) 268 lb 1.6 oz (121.6 kg)  11/23/19 270 lb (122.5 kg)   Swallowing issues, if any: Yes--very sensitive gag reflex, that prevents him from feeling confident in trying to eat anything solid (plus sore throat and altered taste discourage him as well). Smoking or chewing tobacco? None Using fluoride trays daily? Yes Last ENT visit was on: 09/21/2019 Saw Dr. [name] before starting chemo/radiation Other notable issues, if any: Hematuria has resolved. Reports tingling all around tongue. Still has alerted sense of taste (metallic/salty). Difficulty sleeping. Ongoing nausea--manages with prescription anti-nausea medicine. Dry mouth and thick secretions. Residual ringing in ears from Cisplatin treatement, but denies any ear/jaw pain. Patient denies any lympedema.   Vitals:   12/15/19 1056  BP: 125/73  Pulse: 78  Resp: 20  Temp: 97.8 F (36.6 C)  SpO2: 100%  Weight: 264 lb 6.4 oz (119.9 kg)  Height: 5\' 8"  (1.727 m)

## 2019-12-14 NOTE — Progress Notes (Signed)
See telephone note.

## 2019-12-15 ENCOUNTER — Other Ambulatory Visit: Payer: Self-pay

## 2019-12-15 ENCOUNTER — Ambulatory Visit
Admission: RE | Admit: 2019-12-15 | Discharge: 2019-12-15 | Disposition: A | Discharge status: Home / Self Care | Payer: 59 | Source: Ambulatory Visit | Attending: Radiation Oncology | Admitting: Radiation Oncology

## 2019-12-15 VITALS — BP 125/73 | HR 78 | Temp 97.8°F | Resp 20 | Ht 68.0 in | Wt 264.4 lb

## 2019-12-15 DIAGNOSIS — C090 Malignant neoplasm of tonsillar fossa: Secondary | ICD-10-CM | POA: Insufficient documentation

## 2019-12-15 DIAGNOSIS — Z79899 Other long term (current) drug therapy: Secondary | ICD-10-CM | POA: Insufficient documentation

## 2019-12-15 DIAGNOSIS — Z9221 Personal history of antineoplastic chemotherapy: Secondary | ICD-10-CM | POA: Diagnosis not present

## 2019-12-15 DIAGNOSIS — Z923 Personal history of irradiation: Secondary | ICD-10-CM | POA: Diagnosis not present

## 2019-12-15 NOTE — Progress Notes (Signed)
Oncology Nurse Navigator Documentation  I met with patient and his wife during his 2 week follow up with Dr. Isidore Moos. He is improving slowly after radiation/chemotherapy treatment. I reminded him of the importance of keeping all appointments scheduled to help with his recovery. He will see Dr. Isidore Moos again on 10/26 for results of CT scan of his neck and chest the day before. He will see Dr. Irene Limbo again on 8/19 for a follow up appointment. They know to call me if they have any further questions or concerns.   Harlow Asa RN, BSN, OCN Head & Neck Oncology Nurse [facility] [place] [facility] Phone # [phone]  Fax # [phone]

## 2019-12-17 ENCOUNTER — Encounter: Payer: Self-pay | Admitting: Radiation Oncology

## 2019-12-17 NOTE — Progress Notes (Signed)
Radiation Oncology         [phone]) [phone] ________________________________  Name: Dustin Leiter Sr. MRN: [id number]  Date: 12/15/2019  DOB: [birth year]  Follow-Up Visit Note  CC: Claretta Fraise, MD  Claretta Fraise, MD  Diagnosis and Prior Radiotherapy:    C09.0   ICD-10-CM   1. Malignant neoplasm of tonsillar fossa ([place])  C09.0    Cancer Staging Malignant neoplasm of tonsillar fossa ([place]) Staging form: Pharynx - HPV-Mediated Oropharynx, AJCC 8th Edition - Pathologic stage from 10/10/2019: Stage I (pT1, pN1, cM0, p16+) - Signed by Eppie Gibson, MD on 10/11/2019  Squamous cell carcinoma of head and neck ([place]) Staging form: Pharynx - HPV-Mediated Oropharynx, AJCC 8th Edition - Clinical stage from 08/11/2019: Stage I (cT0, cN1, cM0, p16+) - Signed by Eppie Gibson, MD on 08/12/2019   CHIEF COMPLAINT: tonsil cancer  Narrative:  The patient returns today for routine follow-up.   Dustin Rios presents today for follow-up of radiation to his right tonsil, completed on 11/30/2019  Pain issues, if any: Sore throat and skin very sore on right side of neck Using a feeding tube?: He is tolerating 6 cartons of Osmolite 1.5 daily. He can drink about 1 bottle of water a day by mouth and puts remaining water needed through his feeding tube Weight changes, if any:  Wt Readings from Last 3 Encounters:  12/15/19 264 lb 6.4 oz (119.9 kg)  12/05/19 (!) 268 lb 1.6 oz (121.6 kg)  11/23/19 270 lb (122.5 kg)   Swallowing issues, if any: Yes--very sensitive gag reflex, that prevents him from feeling confident in trying to eat anything solid (plus sore throat and altered taste discourage him as well). Smoking or chewing tobacco? None Using fluoride trays daily? Yes Last ENT visit was on: 09/21/2019 Saw Dr. [name] before starting chemo/radiation Other notable issues, if any: Hematuria has resolved. Reports tingling all around tongue. Still has alerted sense of taste (metallic/salty). Difficulty sleeping.  Ongoing nausea--manages with prescription anti-nausea medicine. Dry mouth and thick secretions. Residual ringing in ears from Cisplatin treatement, but denies any ear/jaw pain. Patient denies any lympedema.         ALLERGIES:  has No Known Allergies.  Meds: Current Outpatient Medications  Medication Sig Dispense Refill  . Colchicine (MITIGARE) 0.6 MG CAPS Take 2 capsules at onset of flare. Then take 1 capsule 1 hour later.  May take 1 capsule up to twice daily thereafter. (Patient not taking: Reported on 09/28/2019) 60 capsule 2  . HYDROcodone-acetaminophen (HYCET) 7.5-325 mg/15 ml solution Take 15 mLs by mouth 4 (four) times daily as needed for moderate pain. (Patient not taking: Reported on 09/28/2019) 420 mL 0  . HYDROcodone-acetaminophen (NORCO/VICODIN) 5-325 MG tablet Take 1 tablet by mouth every 6 (six) hours as needed for moderate pain. 30 tablet 0  . lidocaine (XYLOCAINE) 2 % solution Patient: Mix 1part 2% viscous lidocaine, 1part H20. Swish & swallow 19mL of diluted mixture, 53min before meals and at bedtime, up to QID 200 mL 2  . lidocaine-prilocaine (EMLA) cream     . loratadine (EQ LORATADINE) 10 MG tablet Take 10 mg by mouth daily.     Marland Kitchen LORazepam (ATIVAN) 0.5 MG tablet Take 1-2 tablets (0.5-1 mg total) by mouth every 8 (eight) hours as needed for anxiety or sleep (chemotherapy related nausea). 60 tablet 0  . magic mouthwash w/lidocaine SOLN Take 5 mLs by mouth 4 (four) times daily as needed for mouth pain. 80 ml viscous lidocaine 2% 80 ml Mylanta 80 ml diphenhydramine  12.5 mg per 5 ml elixir 80 ml nystatin 100,000U suspension 80 ml prednisolone 15mg  per 63ml solution 80 ml distilled water Disp: 480 ml 480 mL 1  . Nutritional Supplements (FEEDING SUPPLEMENT, OSMOLITE 1.5 CAL,) LIQD Begin Osmolite 1.5 via PEG.  Advance as tolerated to goal rate of 7 cartons daily in 4 feedings with 120 mL of free water before and after each bolus feeding.  In addition mix 30 mL Prosource with 60 mL of  water 4 times daily and infuse into PEG with 60 mL free water after each Prosource administration.  Drink an additional 10 ounces of water daily or put into PEG.  If it provides 2885 cal, 144.3 g protein and 2887 mL free water/100% estimated needs. (Patient not taking: Reported on 11/23/2019) 1659 mL 3  . ondansetron (ZOFRAN) 8 MG tablet Take 1 tablet (8 mg total) by mouth every 8 (eight) hours as needed for nausea or vomiting. 30 tablet 0  . pantoprazole (PROTONIX) 40 MG tablet Take 1 tablet (40 mg total) by mouth daily before breakfast. 30 tablet 2  . Polyethylene Glycol 3350 (MIRALAX PO) Take 17 g by mouth daily. Takes 17 g powder mixed in liquid (up to mark on cap) daily    . prochlorperazine (COMPAZINE) 10 MG tablet Take 1 tablet (10 mg total) by mouth every 6 (six) hours as needed for nausea or vomiting. 30 tablet 0  . promethazine (PHENERGAN) 25 MG suppository Place 1 suppository (25 mg total) rectally every 6 (six) hours as needed for nausea or vomiting. (Patient not taking: Reported on 11/16/2019) 12 suppository 1  . senna-docusate (SENNA S) 8.6-50 MG tablet Take 2 tablets by mouth at bedtime as needed for mild constipation or moderate constipation. 60 tablet 1  . sodium fluoride (FLUORISHIELD) 1.1 % GEL dental gel APPLY TO TOOTHBRUSH OR IN FLUORIDE TRAYS AS DIRECTED. REPEAT NIGHTLY. SPIT OUT EXCESS DO NOT SWALLOW. DO NOT RINSE AFTERWARDS    . sodium fluoride (PREVIDENT 5000 PLUS) 1.1 % CREA dental cream Apply to tooth brush or in fluoride trays as directed.  Repeat nightly. Spit out excess-DO NOT swallow. DO NOT rinse afterwards. (Patient not taking: Reported on 10/25/2019) 2 Tube prn  . tamsulosin (FLOMAX) 0.4 MG CAPS capsule Take 1 capsule (0.4 mg total) by mouth daily. (Patient not taking: Reported on 11/09/2019) 7 capsule 0  . traZODone (DESYREL) 50 MG tablet Take 1 tablet (50 mg total) by mouth at bedtime as needed for sleep. (Patient not taking: Reported on 10/05/2019) 30 tablet 3   No current  facility-administered medications for this encounter.   Facility-Administered Medications Ordered in Other Encounters  Medication Dose Route Frequency Provider Last Rate Last Admin  . 0.9 %  sodium chloride infusion   Intravenous Continuous Monia Sabal, PA-C        Physical Findings: The patient is in no acute distress. Patient is alert and oriented. Wt Readings from Last 3 Encounters:  12/15/19 264 lb 6.4 oz (119.9 kg)  12/05/19 (!) 268 lb 1.6 oz (121.6 kg)  11/23/19 270 lb (122.5 kg)    height is 5\' 8"  (1.727 m) and weight is 264 lb 6.4 oz (119.9 kg). His temperature is 97.8 F (36.6 C). His blood pressure is 125/73 and his pulse is 78. His respiration is 20 and oxygen saturation is 100%. .  General: Alert and oriented, in no acute distress HEENT: Head is normocephalic.  Oropharynx is notable for no lesions or thrush. Neck: Neck is notable for well approximated surgical  scar, right neck.  No palpable masses. Psychiatric: Judgment and insight are intact. Affect is appropriate.   Lab Findings: Lab Results  Component Value Date   WBC 2.7 (L) 12/05/2019   HGB 12.3 (L) 12/05/2019   HCT 36.2 (L) 12/05/2019   MCV 92.1 12/05/2019   PLT 197 12/05/2019    Lab Results  Component Value Date   TSH 2.930 04/03/2019    Radiographic Findings: No results found.  Impression/Plan:  History of tonsil cancer, s/p adjuvant ChRT.  Doing a great job w/ his nutrition - I applauded him on this.  He is improving slowly after radiation/chemotherapy treatment.  He will see me again on 10/26 for results of CT scan of his neck and chest the day before. He will see Dr. Irene Limbo again on 8/19 for a follow up appointment. They know to call me if they have any further questions or concerns.    On date of service, in total, I spent 25 minutes on this encounter.  He was seen in person.   _____________________________________   Eppie Gibson, MD

## 2019-12-18 ENCOUNTER — Ambulatory Visit (INDEPENDENT_AMBULATORY_CARE_PROVIDER_SITE_OTHER): Payer: 59 | Admitting: Urology

## 2019-12-18 ENCOUNTER — Other Ambulatory Visit: Payer: Self-pay

## 2019-12-18 ENCOUNTER — Encounter: Payer: Self-pay | Admitting: Urology

## 2019-12-18 VITALS — BP 137/83 | HR 97 | Ht 68.0 in | Wt 260.0 lb

## 2019-12-18 DIAGNOSIS — C090 Malignant neoplasm of tonsillar fossa: Secondary | ICD-10-CM

## 2019-12-18 DIAGNOSIS — R319 Hematuria, unspecified: Secondary | ICD-10-CM

## 2019-12-18 LAB — URINALYSIS, ROUTINE W REFLEX MICROSCOPIC
Bilirubin, UA: NEGATIVE
Glucose, UA: NEGATIVE
Ketones, UA: NEGATIVE
Leukocytes,UA: NEGATIVE
Nitrite, UA: NEGATIVE
Protein,UA: NEGATIVE
RBC, UA: NEGATIVE
Specific Gravity, UA: 1.015 (ref 1.005–1.030)
Urobilinogen, Ur: 0.2 mg/dL (ref 0.2–1.0)
pH, UA: 5.5 (ref 5.0–7.5)

## 2019-12-18 NOTE — Progress Notes (Signed)
Urological Symptom Review  Patient is experiencing the following symptoms: None   Review of Systems  Gastrointestinal (upper)  : Nausea  Gastrointestinal (lower) : Diarrhea Constipation  Constitutional : Weight loss Fatigue  Skin: Skin rash/lesion  Eyes: Negative for eye symptoms  Ear/Nose/Throat : Sore throat  Hematologic/Lymphatic: Negative for Hematologic/Lymphatic symptoms  Cardiovascular : Negative for cardiovascular symptoms  Respiratory : Negative for respiratory symptoms  Endocrine: Negative for endocrine symptoms  Musculoskeletal: Negative for musculoskeletal symptoms  Neurological: Negative for neurological symptoms  Psychologic: Negative for psychiatric symptoms

## 2019-12-18 NOTE — Progress Notes (Signed)
12/18/2019 10:34 AM   [name] 1966-01-15 [id number]  Referring provider: Claretta Fraise, MD [place],  [place] [address]  Gross hematuria  HPI: Mr Dustin Rios is a 54yo here for evaluation of gross hematuria. He noticed the gross painless hematuria 1 month ago after getting cisplatin chemotherapy. It occurs soon after finishing the infusion and last 36 hours. No hx of tobacco abuse. No significant LUTS. PET scan from 08/10/2019 showed no enhancing lesions in the GU system. No significant LUTS, He is not on flomax. UA today shows no blood   PMH: Past Medical History:  Diagnosis Date  . Arthritis   . Change in stool 02/22/2017  . Gout 02/22/2017  . Hyperlipidemia   . Sleep apnea    Uses CPAP  . Tonsil cancer [facility])    metastatic    Surgical History: Past Surgical History:  Procedure Laterality Date  . CARPAL TUNNEL RELEASE Right 04/10/2019  . DIRECT LARYNGOSCOPY N/A 08/21/2019   Procedure: DIRECT LARYNGOSCOPY - WITH BX;  Surgeon: [name], MD;  Location: [place];  Service: ENT;  Laterality: N/A;  . IR GASTROSTOMY TUBE MOD SED  10/13/2019  . IR IMAGING GUIDED PORT INSERTION  10/13/2019  . NECK DISSECTION  09/11/2019  . RADICAL NECK DISSECTION Right 09/11/2019   Procedure: RADICAL NECK DISSECTION;  Surgeon: [name], MD;  Location: [place];  Service: ENT;  Laterality: Right;  . TONSILLECTOMY Bilateral 08/21/2019   Procedure: TONSILLECTOMY;  Surgeon: [name], MD;  Location: [place];  Service: ENT;  Laterality: Bilateral;  . WISDOM TOOTH EXTRACTION      Home Medications:  Allergies as of 12/18/2019   No Known Allergies     Medication List       Accurate as of December 18, 2019 10:34 AM. If you have any questions, ask your nurse or doctor.        STOP taking these medications   lidocaine 2 % solution Commonly known as: XYLOCAINE Stopped by: Nicolette Bang, MD   magic mouthwash w/lidocaine Soln Stopped by: Nicolette Bang, MD   MIRALAX PO Stopped by: Nicolette Bang, MD   traZODone 50 MG tablet Commonly known as: DESYREL Stopped by: Nicolette Bang, MD     TAKE these medications   Colchicine 0.6 MG Caps Commonly known as: Mitigare Take 2 capsules at onset of flare. Then take 1 capsule 1 hour later.  May take 1 capsule up to twice daily thereafter.   EQ Loratadine 10 MG tablet Generic drug: loratadine Take 10 mg by mouth daily.   feeding supplement (OSMOLITE 1.5 CAL) Liqd Begin Osmolite 1.5 via PEG.  Advance as tolerated to goal rate of 7 cartons daily in 4 feedings with 120 mL of free water before and after each bolus feeding.  In addition mix 30 mL Prosource with 60 mL of water 4 times daily and infuse into PEG with 60 mL free water after each Prosource administration.  Drink an additional 10 ounces of water daily or put into PEG.  If it provides 2885 cal, 144.3 g protein and 2887 mL free water/100% estimated needs.   HYDROcodone-acetaminophen 7.5-325 mg/15 ml solution Commonly known as: HYCET Take 15 mLs by mouth 4 (four) times daily as needed for moderate pain. What changed: Another medication with the same name was removed. Continue taking this medication, and follow the directions you see here. Changed by: Nicolette Bang, MD   lidocaine-prilocaine cream Commonly known as: EMLA   LORazepam  0.5 MG tablet Commonly known as: ATIVAN Take 1-2 tablets (0.5-1 mg total) by mouth every 8 (eight) hours as needed for anxiety or sleep (chemotherapy related nausea).   ondansetron 8 MG tablet Commonly known as: ZOFRAN Take 1 tablet (8 mg total) by mouth every 8 (eight) hours as needed for nausea or vomiting.   pantoprazole 40 MG tablet Commonly known as: Protonix Take 1 tablet (40 mg total) by mouth daily before breakfast.   prochlorperazine 10 MG tablet Commonly known as: COMPAZINE Take 1 tablet (10 mg total) by mouth every 6 (six) hours as needed for nausea or vomiting.    promethazine 25 MG suppository Commonly known as: PHENERGAN Place 1 suppository (25 mg total) rectally every 6 (six) hours as needed for nausea or vomiting.   senna-docusate 8.6-50 MG tablet Commonly known as: Senna S Take 2 tablets by mouth at bedtime as needed for mild constipation or moderate constipation.   sodium fluoride 1.1 % Crea dental cream Commonly known as: PreviDent 5000 Plus Apply to tooth brush or in fluoride trays as directed.  Repeat nightly. Spit out excess-DO NOT swallow. DO NOT rinse afterwards.   sodium fluoride 1.1 % Gel dental gel Commonly known as: FLUORISHIELD APPLY TO TOOTHBRUSH OR IN FLUORIDE TRAYS AS DIRECTED. REPEAT NIGHTLY. SPIT OUT EXCESS DO NOT SWALLOW. DO NOT RINSE AFTERWARDS   tamsulosin 0.4 MG Caps capsule Commonly known as: Flomax Take 1 capsule (0.4 mg total) by mouth daily.       Allergies: No Known Allergies  Family History: Family History  Problem Relation Age of Onset  . COPD Mother   . Uterine cancer Mother   . Lung cancer Brother   . Lung cancer Maternal Grandfather   . Stomach cancer Paternal Grandmother     Social History:  reports that he has never smoked. He has never used smokeless tobacco. He reports that he does not drink alcohol and does not use drugs.  ROS: All other review of systems were reviewed and are negative except what is noted above in HPI  Physical Exam: BP 137/83   Pulse 97   Ht 5\' 8"  (1.727 m)   Wt 260 lb (117.9 kg)   BMI 39.53 kg/m   Constitutional:  Alert and oriented, No acute distress. HEENT: [place] AT, moist mucus membranes.  Trachea midline, no masses. Cardiovascular: No clubbing, cyanosis, or edema. Respiratory: Normal respiratory effort, no increased work of breathing. GI: Abdomen is soft, nontender, nondistended, no abdominal masses GU: No CVA tenderness. Circumcised phallus. No masses/lesions on penis, testis, scrotum. Prostate 30g smooth no nodules no induration.  Lymph: No cervical or  inguinal lymphadenopathy. Skin: No rashes, bruises or suspicious lesions. Neurologic: Grossly intact, no focal deficits, moving all 4 extremities. Psychiatric: Normal mood and affect.  Laboratory Data: Lab Results  Component Value Date   WBC 2.7 (L) 12/05/2019   HGB 12.3 (L) 12/05/2019   HCT 36.2 (L) 12/05/2019   MCV 92.1 12/05/2019   PLT 197 12/05/2019    Lab Results  Component Value Date   CREATININE 1.06 12/05/2019    No results found for: PSA  No results found for: TESTOSTERONE  Lab Results  Component Value Date   HGBA1C 5.7 04/03/2019    Urinalysis    Component Value Date/Time   COLORURINE YELLOW 11/03/2019 1000   APPEARANCEUR CLEAR 11/03/2019 1000   LABSPEC 1.024 11/03/2019 1000   PHURINE 5.0 11/03/2019 1000   GLUCOSEU NEGATIVE 11/03/2019 1000   HGBUR LARGE (A) 11/03/2019 1000  BILIRUBINUR NEGATIVE 11/03/2019 1000   KETONESUR NEGATIVE 11/03/2019 1000   PROTEINUR NEGATIVE 11/03/2019 1000   NITRITE NEGATIVE 11/03/2019 1000   LEUKOCYTESUR NEGATIVE 11/03/2019 1000    Lab Results  Component Value Date   BACTERIA RARE (A) 11/03/2019    Pertinent Imaging: PET 08/10/2019: Images reviewed and discussed with the patient.  No results found for this or any previous visit.  No results found for this or any previous visit.  No results found for this or any previous visit.  No results found for this or any previous visit.  No results found for this or any previous visit.  No results found for this or any previous visit.  No results found for this or any previous visit.  No results found for this or any previous visit.   Assessment & Plan:    1. Hematuria, unspecified type -We discussed the various causes of gross hematuria. His hematuria is likely related to cisplatin therapy. I will see him back in 3 months with UA. If the hematuria recurs we will proceed with cystoscopy. - Urinalysis, Routine w reflex microscopic   No follow-ups on file.  Nicolette Bang, MD  [facility] [place]

## 2019-12-20 ENCOUNTER — Telehealth: Payer: Self-pay | Admitting: Hematology

## 2019-12-20 NOTE — Telephone Encounter (Signed)
Scheduled per 07/27 los, patient has been called and notified of upcoming appointments. 

## 2019-12-28 ENCOUNTER — Inpatient Hospital Stay (HOSPITAL_BASED_OUTPATIENT_CLINIC_OR_DEPARTMENT_OTHER): Payer: 59 | Admitting: Hematology

## 2019-12-28 ENCOUNTER — Other Ambulatory Visit: Payer: Self-pay

## 2019-12-28 ENCOUNTER — Inpatient Hospital Stay: Payer: 59

## 2019-12-28 VITALS — BP 123/76 | HR 85 | Temp 97.9°F | Resp 18 | Ht 68.0 in | Wt 269.1 lb

## 2019-12-28 DIAGNOSIS — C090 Malignant neoplasm of tonsillar fossa: Secondary | ICD-10-CM

## 2019-12-28 DIAGNOSIS — Z95828 Presence of other vascular implants and grafts: Secondary | ICD-10-CM

## 2019-12-28 DIAGNOSIS — Z801 Family history of malignant neoplasm of trachea, bronchus and lung: Secondary | ICD-10-CM | POA: Diagnosis not present

## 2019-12-28 DIAGNOSIS — Z7189 Other specified counseling: Secondary | ICD-10-CM

## 2019-12-28 DIAGNOSIS — Z808 Family history of malignant neoplasm of other organs or systems: Secondary | ICD-10-CM | POA: Diagnosis not present

## 2019-12-28 DIAGNOSIS — C099 Malignant neoplasm of tonsil, unspecified: Secondary | ICD-10-CM | POA: Diagnosis present

## 2019-12-28 DIAGNOSIS — M129 Arthropathy, unspecified: Secondary | ICD-10-CM | POA: Diagnosis not present

## 2019-12-28 DIAGNOSIS — K1233 Oral mucositis (ulcerative) due to radiation: Secondary | ICD-10-CM

## 2019-12-28 DIAGNOSIS — Z800 Family history of malignant neoplasm of digestive organs: Secondary | ICD-10-CM | POA: Diagnosis not present

## 2019-12-28 DIAGNOSIS — E785 Hyperlipidemia, unspecified: Secondary | ICD-10-CM | POA: Diagnosis not present

## 2019-12-28 DIAGNOSIS — M109 Gout, unspecified: Secondary | ICD-10-CM | POA: Diagnosis not present

## 2019-12-28 DIAGNOSIS — Z5111 Encounter for antineoplastic chemotherapy: Secondary | ICD-10-CM

## 2019-12-28 DIAGNOSIS — Z79899 Other long term (current) drug therapy: Secondary | ICD-10-CM | POA: Diagnosis not present

## 2019-12-28 DIAGNOSIS — G4730 Sleep apnea, unspecified: Secondary | ICD-10-CM | POA: Diagnosis not present

## 2019-12-28 LAB — BASIC METABOLIC PANEL WITH GFR
Anion gap: 12 (ref 5–15)
BUN: 18 mg/dL (ref 6–20)
CO2: 25 mmol/L (ref 22–32)
Calcium: 9.7 mg/dL (ref 8.9–10.3)
Chloride: 102 mmol/L (ref 98–111)
Creatinine, Ser: 1.09 mg/dL (ref 0.61–1.24)
GFR calc Af Amer: >60 mL/min
GFR calc non Af Amer: >60 mL/min
Glucose, Bld: 72 mg/dL (ref 70–99)
Potassium: 4.1 mmol/L (ref 3.5–5.1)
Sodium: 139 mmol/L (ref 135–145)

## 2019-12-28 LAB — CBC WITH DIFFERENTIAL/PLATELET
Abs Immature Granulocytes: 0.02 10*3/uL (ref 0.00–0.07)
Basophils Absolute: 0 10*3/uL (ref 0.0–0.1)
Basophils Relative: 1 %
Eosinophils Absolute: 0.1 10*3/uL (ref 0.0–0.5)
Eosinophils Relative: 2 %
HCT: 34 % — ABNORMAL LOW (ref 39.0–52.0)
Hemoglobin: 11.5 g/dL — ABNORMAL LOW (ref 13.0–17.0)
Immature Granulocytes: 0 %
Lymphocytes Relative: 13 %
Lymphs Abs: 0.8 10*3/uL (ref 0.7–4.0)
MCH: 32.4 pg (ref 26.0–34.0)
MCHC: 33.8 g/dL (ref 30.0–36.0)
MCV: 95.8 fL (ref 80.0–100.0)
Monocytes Absolute: 0.8 10*3/uL (ref 0.1–1.0)
Monocytes Relative: 12 %
Neutro Abs: 4.6 10*3/uL (ref 1.7–7.7)
Neutrophils Relative %: 72 %
Platelets: 189 10*3/uL (ref 150–400)
RBC: 3.55 MIL/uL — ABNORMAL LOW (ref 4.22–5.81)
RDW: 17.2 % — ABNORMAL HIGH (ref 11.5–15.5)
WBC: 6.3 10*3/uL (ref 4.0–10.5)
nRBC: 0 % (ref 0.0–0.2)

## 2019-12-28 LAB — MAGNESIUM: Magnesium: 2 mg/dL (ref 1.7–2.4)

## 2019-12-28 MED ORDER — SODIUM CHLORIDE 0.9% FLUSH
10.0000 mL | Freq: Once | INTRAVENOUS | Status: AC
  Administered 2019-12-28: 10 mL
  Filled 2019-12-28: qty 10

## 2019-12-28 MED ORDER — HEPARIN SOD (PORK) LOCK FLUSH 100 UNIT/ML IV SOLN
500.0000 [IU] | Freq: Once | INTRAVENOUS | Status: AC
  Administered 2019-12-28: 500 [IU]
  Filled 2019-12-28: qty 5

## 2019-12-28 NOTE — Patient Instructions (Signed)

## 2019-12-28 NOTE — Progress Notes (Signed)
HEMATOLOGY/ONCOLOGY CLINIC NOTE  Date of Service: 12/28/2019  Patient Care Team: Claretta Fraise, MD as PCP - General (Family Medicine) Jodi Marble, MD as Consulting Physician (Otolaryngology) Eppie Gibson, MD as Attending Physician (Radiation Oncology) Tish Men, MD (Inactive) as Consulting Physician (Hematology) Leota Sauers, RN (Inactive) as Oncology Nurse Navigator Malmfelt, Stephani Police, RN as Oncology Nurse Navigator (Oncology) Karie Mainland, RD as Dietitian (Nutrition)  REFERRING PHYSICIAN: Claretta Fraise, MD  CHIEF COMPLAINTS/PURPOSE OF CONSULTATION:  Squamous Cell Carcinoma of the Head and Neck  HISTORY OF PRESENTING ILLNESS:   Dustin Leiter Sr. is a wonderful 54 y.o. male who has been referred to [place] by Claretta Fraise, MD for evaluation and management of Squamous Cell Carcinoma of the Head and Neck. Pt is accompanied today by [name], wife. The pt reports that he is doing well overall.   The pt reports he is fine. Pt has had both of his COVID19 vaccine. He is healing well from surgery. Some of the nerves were damaged and he has been working it. Part of his salivary gland on the right side was taken out and it burns when he starts to eat. Pt has seen swallowing evaluation today prior to coming. He is back to eating whatever he wants to now. When swallowing he has a burning sensation. Pt has a sleep apnea machine that dries out his throat. Pt also cannot open mouth completely since the dissection.  Pt had a biopsy on March 17th and found it was a squamous cell carcinoma. From his surgery he has various burning, tingling and numbness around his head, neck and shoulder.   Pt has sleep apnea. He also had gout but stopped drinking tea and has not had a flair up since the end of last year. He has no other major medical problems and works with Forensic psychologist. He has had slight hearing loss over the years due to job and being in the [employer]. Pt has never smoked, but his  mother and brohter smoked a lot and pt was exposed to second hand smoke for about 23 years. Many of the pt's family members especially from his mother side have had cancer's that were very aggressive and they passed at early ages.   Of note prior to the patient's visit today, pt has had Surgical Pathology ([id number]) completed on 09/11/19 with results revealing "LYMPH NODE, MODIFIED RADICAL NECK I-IV, REGIONAL DISSECTION: Metastatic carcinoma in 3 of 49 lymph nodes (3/49), with extracapsular extension." Pt has had Surgical Pathology ([id number]) completed on 08/21/19 with results revealing "A. TONSIL, RIGHT: Squamous cell carcinoma, basaloid, p16 positive.  Margins not involved.  Carcinoma focally 1 mm from surgical margin.  See oncology table and comment.  B. TONSIL, LEFT:  Benign tonsil.  No malignancy identified. C. TONGUE, RIGHT BASE, BIOPSY:  Benign tonsillar tissue. No malignancy identified. D. TONGUE, LEFT BASE, BIOPSY: Benign tonsillar tissue.  No malignancy identified. "  Pt has had PET Skull Base to Thigh ([id number]) completed on 08/10/19 with results revealing "1. The 2.8 cm right level IIa lymph node has a maximum SUV of 28.4. A primary lesion is not readily seen. There is some asymmetric palatine tonsillar activity with the left tonsil having maximum SUV of 12.4 and the right 7.8, but no tonsillar mass is observed on the CT data. 2. No compelling findings of active malignancy in the chest, abdomen/pelvis, or skeleton. There is some accentuated anal activity which is likely physiologic given the lack of visible abnormality on the  CT data. 3. Other imaging findings of potential clinical significance: Diffuse hepatic steatosis. Hypodense left renal lesion is probably a cyst but technically too small to characterize." Pt has had [place] Core Biopsy ([id number]) completed on  07/26/19 with results revealing "Successful ultrasound-guided right cervical lymph node/lesion Biopsy." Pt has had of CT  Soft Tissue Neck ([id number]) completed on 07/13/19 with results revealing "4.1 x 3.2 cm necrotic/cystic node or nodal conglomerate at the right level II station likely reflecting nodal metastatic disease. No definite primary mass is identified within the oral cavity, pharynx or larynx. Correlate with direct visualization and consider direct tissue sampling. Additionally, PET-CT may be helpful. Cervical spondylosis as described. A prominent C6-C7 posterior disc osteophyte contributes to at least moderate bony spinal canal stenosis. Multilevel neural foraminal narrowing."  Most recent lab results (09/11/19) of CBC is as follows: all values are WNL except for Glucose at 100, Calcium at 8.7  On review of systems, pt reports various tingling, numbness, burnings sensations around head neck and shoulder, weight loss, insomnia and denies back pain, abdominal pain and any other symptoms.   On PMHx the pt reports tonsillectomy 08/21/19, radical neck dissection 09/11/19, sleep apnea, gout, wisdom tooth extraction, carpal tunnel surgery    On Social Hx the pt reports never smoked, smoked marijuana 30 years ago, works Forensic psychologist, pt was in [employer]   On Family Hx the pt reports brother metastatic lung cancer at age 109, Industry had liver cancer and pancreatic cancer in early 13's of age, mother has uterine cancer at age 15, grandmother on fathers side has stomach cancer, great grandmother had lung cancer    INTERVAL HISTORY: Dustin Caster Sr. is a wonderful 54 y.o. male who is here for evaluation and management of Squamous Cell Carcinoma of the Head and Neck. The patient's last visit with [place] was on 12/05/2019. The pt reports that he is doing well overall.  The pt reports that his throat is less sore and the skin on his neck is no longer red. Pt has been able to take in water and Boost orally. Pt is still taking 100% of his nutrition via tube. He has been able to get restful sleep with Lorazepam.  His energy levels vary significantly. Pt has been nauseous the last few mornings. Pt denies significant cough, but is experiencing thick oral secretions. He had a Gout flare in his right wrist for which he took Colchicine. Pt has at least 4-5 Gout attacks per year.   Pt has followed up with speech therapy and plans to see them monthly. He was also seen by Urology who found nothing of concern and no addition workup was recommended.    Lab results today (12/28/19) of CBC w/diff and BMP is as follows: all values are WNL except for RBC at 3.55, Hgb at 11.5, HCT at 34.0, RDW at 17.2. 12/28/2019 Magnesium at 2.0  On review of systems, pt reports chills, congestion, fatigue, nausea, jaw pain and denies fevers, leg swelling, sleeplessness, unexpected weight loss, cough and any other symptoms.   MEDICAL HISTORY:  Past Medical History:  Diagnosis Date  . Arthritis   . Change in stool 02/22/2017  . Gout 02/22/2017  . Hyperlipidemia   . Sleep apnea    Uses CPAP  . Tonsil cancer [facility])    metastatic     SURGICAL HISTORY: Past Surgical History:  Procedure Laterality Date  . CARPAL TUNNEL RELEASE Right 04/10/2019  . DIRECT LARYNGOSCOPY N/A 08/21/2019   Procedure: DIRECT LARYNGOSCOPY -  WITH BX;  Surgeon: [name], MD;  Location: [place];  Service: ENT;  Laterality: N/A;  . IR GASTROSTOMY TUBE MOD SED  10/13/2019  . IR IMAGING GUIDED PORT INSERTION  10/13/2019  . NECK DISSECTION  09/11/2019  . RADICAL NECK DISSECTION Right 09/11/2019   Procedure: RADICAL NECK DISSECTION;  Surgeon: [name], MD;  Location: [place];  Service: ENT;  Laterality: Right;  . TONSILLECTOMY Bilateral 08/21/2019   Procedure: TONSILLECTOMY;  Surgeon: [name], MD;  Location: [place];  Service: ENT;  Laterality: Bilateral;  . WISDOM TOOTH EXTRACTION       SOCIAL HISTORY: Social History   Socioeconomic History  . Marital status: Married    Spouse name: Not on file  . Number of  children: 1  . Years of education: Not on file  . Highest education level: Not on file  Occupational History  . Occupation: Clinical biochemist  Tobacco Use  . Smoking status: Never Smoker  . Smokeless tobacco: Never Used  Vaping Use  . Vaping Use: Never used  Substance and Sexual Activity  . Alcohol use: No  . Drug use: No  . Sexual activity: Not on file  Other Topics Concern  . Not on file  Social History Narrative   Patient is married and has 1 son.   Patient has never smoked, used tobacco products.   Patient does not drink.  Patient does not use illicit drugs.   Social Determinants of Health   Financial Resource Strain:   . Difficulty of Paying Living Expenses: Not on file  Food Insecurity:   . Worried About Charity fundraiser in the Last Year: Not on file  . Ran Out of Food in the Last Year: Not on file  Transportation Needs:   . Lack of Transportation (Medical): Not on file  . Lack of Transportation (Non-Medical): Not on file  Physical Activity:   . Days of Exercise per Week: Not on file  . Minutes of Exercise per Session: Not on file  Stress:   . Feeling of Stress : Not on file  Social Connections:   . Frequency of Communication with Friends and Family: Not on file  . Frequency of Social Gatherings with Friends and Family: Not on file  . Attends Religious Services: Not on file  . Active Member of Clubs or Organizations: Not on file  . Attends Archivist Meetings: Not on file  . Marital Status: Not on file  Intimate Partner Violence:   . Fear of Current or Ex-Partner: Not on file  . Emotionally Abused: Not on file  . Physically Abused: Not on file  . Sexually Abused: Not on file     FAMILY HISTORY: Family History  Problem Relation Age of Onset  . COPD Mother   . Uterine cancer Mother   . Lung cancer Brother   . Lung cancer Maternal Grandfather   . Stomach cancer Paternal Grandmother      ALLERGIES:   has No Known Allergies.   MEDICATIONS:   Current Outpatient Medications  Medication Sig Dispense Refill  . Colchicine (MITIGARE) 0.6 MG CAPS Take 2 capsules at onset of flare. Then take 1 capsule 1 hour later.  May take 1 capsule up to twice daily thereafter. 60 capsule 2  . HYDROcodone-acetaminophen (HYCET) 7.5-325 mg/15 ml solution Take 15 mLs by mouth 4 (four) times daily as needed for moderate pain. 420 mL 0  . lidocaine-prilocaine (EMLA) cream     .  loratadine (EQ LORATADINE) 10 MG tablet Take 10 mg by mouth daily.     Marland Kitchen LORazepam (ATIVAN) 0.5 MG tablet Take 1-2 tablets (0.5-1 mg total) by mouth every 8 (eight) hours as needed for anxiety or sleep (chemotherapy related nausea). 60 tablet 0  . Nutritional Supplements (FEEDING SUPPLEMENT, OSMOLITE 1.5 CAL,) LIQD Begin Osmolite 1.5 via PEG.  Advance as tolerated to goal rate of 7 cartons daily in 4 feedings with 120 mL of free water before and after each bolus feeding.  In addition mix 30 mL Prosource with 60 mL of water 4 times daily and infuse into PEG with 60 mL free water after each Prosource administration.  Drink an additional 10 ounces of water daily or put into PEG.  If it provides 2885 cal, 144.3 g protein and 2887 mL free water/100% estimated needs. 1659 mL 3  . ondansetron (ZOFRAN) 8 MG tablet Take 1 tablet (8 mg total) by mouth every 8 (eight) hours as needed for nausea or vomiting. 30 tablet 0  . pantoprazole (PROTONIX) 40 MG tablet Take 1 tablet (40 mg total) by mouth daily before breakfast. 30 tablet 2  . prochlorperazine (COMPAZINE) 10 MG tablet Take 1 tablet (10 mg total) by mouth every 6 (six) hours as needed for nausea or vomiting. 30 tablet 0  . promethazine (PHENERGAN) 25 MG suppository Place 1 suppository (25 mg total) rectally every 6 (six) hours as needed for nausea or vomiting. 12 suppository 1  . senna-docusate (SENNA S) 8.6-50 MG tablet Take 2 tablets by mouth at bedtime as needed for mild constipation or moderate constipation. 60 tablet 1  . sodium fluoride  (FLUORISHIELD) 1.1 % GEL dental gel APPLY TO TOOTHBRUSH OR IN FLUORIDE TRAYS AS DIRECTED. REPEAT NIGHTLY. SPIT OUT EXCESS DO NOT SWALLOW. DO NOT RINSE AFTERWARDS    . sodium fluoride (PREVIDENT 5000 PLUS) 1.1 % CREA dental cream Apply to tooth brush or in fluoride trays as directed.  Repeat nightly. Spit out excess-DO NOT swallow. DO NOT rinse afterwards. 2 Tube prn  . tamsulosin (FLOMAX) 0.4 MG CAPS capsule Take 1 capsule (0.4 mg total) by mouth daily. 7 capsule 0   No current facility-administered medications for this visit.   Facility-Administered Medications Ordered in Other Visits  Medication Dose Route Frequency Provider Last Rate Last Admin  . 0.9 %  sodium chloride infusion   Intravenous Continuous Monia Sabal, PA-C         REVIEW OF SYSTEMS:   A 10+ POINT REVIEW OF SYSTEMS WAS OBTAINED including neurology, dermatology, psychiatry, cardiac, respiratory, lymph, extremities, GI, GU, Musculoskeletal, constitutional, breasts, reproductive, HEENT.  All pertinent positives are noted in the HPI.  All others are negative.   PHYSICAL EXAMINATION: ECOG PERFORMANCE STATUS: 1 - Symptomatic but completely ambulatory  Vitals:   12/28/19 1351  BP: 123/76  Pulse: 85  Resp: 18  Temp: 97.9 F (36.6 C)  SpO2: 97%   Filed Weights   12/28/19 1351  Weight: 269 lb 1.6 oz (122.1 kg)   Body mass index is 40.92 kg/m.   GENERAL:alert, in no acute distress and comfortable SKIN: no acute rashes, no significant lesions   EYES: conjunctiva are pink and non-injected, sclera anicteric OROPHARYNX: MMM, no exudates, no oropharyngeal erythema or ulceration NECK: supple, no JVD LYMPH:  no palpable lymphadenopathy in the cervical, axillary or inguinal regions LUNGS: clear to auscultation b/l with normal respiratory effort HEART: regular rate & rhythm ABDOMEN:  normoactive bowel sounds , non tender, not distended. No palpable hepatosplenomegaly.  Extremity: no pedal edema PSYCH: alert & oriented x 3  with fluent speech NEURO: no focal motor/sensory deficits  LABORATORY DATA:  I have reviewed the data as listed  CBC Latest Ref Rng & Units 12/28/2019 12/05/2019 11/23/2019  WBC 4.0 - 10.5 K/uL 6.3 2.7(L) 2.8(L)  Hemoglobin 13.0 - 17.0 g/dL 11.5(L) 12.3(L) 12.0(L)  Hematocrit 39 - 52 % 34.0(L) 36.2(L) 35.7(L)  Platelets 150 - 400 K/uL 189 197 106(L)    CMP Latest Ref Rng & Units 12/28/2019 12/05/2019 11/23/2019  Glucose 70 - 99 mg/dL 72 134(H) 94  BUN 6 - 20 mg/dL 18 17 18   Creatinine 0.61 - 1.24 mg/dL 1.09 1.06 1.32(H)  Sodium 135 - 145 mmol/L 139 138 139  Potassium 3.5 - 5.1 mmol/L 4.1 4.1 4.4  Chloride 98 - 111 mmol/L 102 101 99  CO2 22 - 32 mmol/L 25 31 29   Calcium 8.9 - 10.3 mg/dL 9.7 9.9 9.1  Total Protein 6.5 - 8.1 g/dL - 6.9 -  Total Bilirubin 0.3 - 1.2 mg/dL - 0.6 -  Alkaline Phos 38 - 126 U/L - 89 -  AST 15 - 41 U/L - 14(L) -  ALT 0 - 44 U/L - 17 -     RADIOGRAPHIC STUDIES: I have personally reviewed the radiological images as listed and agreed with the findings in the report.  PET/CT 08/10/2019: IMPRESSION: 1. The 2.8 cm right level IIa lymph node has a maximum SUV of 28.4. A primary lesion is not readily seen. There is some asymmetric palatine tonsillar activity with the left tonsil having maximum SUV of 12.4 and the right 7.8, but no tonsillar mass is observed on the CT data. 2. No compelling findings of active malignancy in the chest, abdomen/pelvis, or skeleton. There is some accentuated anal activity which is likely physiologic given the lack of visible abnormality on the CT data. 3. Other imaging findings of potential clinical significance: Diffuse hepatic steatosis. Hypodense left renal lesion is probably a cyst but technically too small to characterize.   Electronically Signed   By: Van Clines M.D.   On: 08/10/2019 15:54   08/21/19 of Surgical Pathology [id number]    SURGICAL PATHOLOGY  * THIS IS AN AMENDED REPORT   CASE:  [id number]  PATIENT: [facility]  Surgical Pathology Report   Amendment    Reason for Amendment #1: Other   Clinical History: squamous cell of neck and head (cm)     FINAL MICROSCOPIC DIAGNOSIS:   A. TONSIL, RIGHT:  - Squamous cell carcinoma, basaloid, p16 positive.  - Margins not involved.  - Carcinoma focally 1 mm from surgical margin.  - See oncology table and comment.   B. TONSIL, LEFT:  - Benign tonsil.  - No malignancy identified.   C. TONGUE, RIGHT BASE, BIOPSY:  - Benign tonsillar tissue.  - No malignancy identified.   D. TONGUE, LEFT BASE, BIOPSY:  - Benign tonsillar tissue.  - No malignancy identified.   ONCOLOGY TABLE:  PHARYNX (OROPHARYNX, HYPOPHARYNX, NASOPHARYNX):  Procedure: Right and left tonsillectomy and right and left tongue base  biopsies.  Tumor Site: Right tonsil.  Tumor Laterality: Right tonsil.  Tumor Focality: Unifocal.  Tumor Size: 1 cm x 0.3 cm (3 mm) depth.  Histologic Type: Squamous cell carcinoma, basaloid type, p16 positive,  EBV negative.  Histologic Grade: Poorly differentiated.  Margins: Free of tumor. Carcinoma focally 1 mm from the nearest  surgical margin.    Distance to closest Margin: 1 mm.  Lymphovascular Invasion: Present.  Perineural  Invasion: Not identified.  Regional Lymph Nodes: No lymph nodes submitted with this case. The  previous right neck lymph node needle core biopsy (MCS [id number]) shows  squamous cell carcinoma.  Pathologic Stage Classification (pTNM, AJCC 8th Edition): pT1, pN1, see  comment.    09/11/19 of Surgical Pathology Report [id number])  COMMENT:   Metastatic carcinoma is present in 3 lymph nodes - 1 level 1 lymph node,  1 level 2 lymph node (this metastatic focus also involves skeletal  muscle), and 1 level 3 lymph node (highlighted by a cytokeratin  immunohistochemical stain). Additional studies can be performed (block  A11) upon clinician request.    ASSESSMENT & PLAN:    Dustin Leiter Sr. is a 54 y.o. male with:  1. Rt Tonsilar Basaloid Squamous cell carcinoma , HPV+ve. PT1,pN1,M0. 3/49 LN+ve High risk features - LVI, extranodal extension, high risk morphology. Significant second hand smoke exposure S/p b/l tonsillectomy on 08/21/2019 S/p rt radical neck dissection   PLAN: -Discussed pt labwork today, 12/28/19; WBC & PLT are nml, mild anemia, blood chemistries are nml, Magnesium is WNL -No lab or clinical evidence of Squamous Cell Carcinoma of the Head and Neck recurrence at this time.  -Advised pt that if he continues having frequent Gout attacks he should consider beginning Allopurinol.  -Recommended that the pt continue to eat well, drink at least 48-64 oz of water each day, and walk 20-30 minutes each day.  -Recommend pt continue salt/baking soda mouth rinses  -Recommend pt f/u with Speech Therapy as scheduled.  -Will see back in 6 weeks with labs    FOLLOW UP: -RTC with Dr. Irene Limbo in 6 weeks with portflush and labs   The total time spent in the appt was 20 minutes and more than 50% was on counseling and direct patient cares.  All of the patient's questions were answered with apparent satisfaction. The patient knows to call the clinic with any problems, questions or concerns.   Sullivan Lone MD [place] AAHIVMS [facility] Hematology/Oncology Physician [facility]  (Office):       [phone] (Work cell):  [phone] (Fax):           [phone]  12/28/2019 2:58 PM  I, Yevette Edwards, am acting as a scribe for Dr. Sullivan Lone.   .I have reviewed the above documentation for accuracy and completeness, and I agree with the above. Brunetta Genera MD

## 2019-12-29 ENCOUNTER — Telehealth: Payer: Self-pay | Admitting: Hematology

## 2019-12-29 NOTE — Telephone Encounter (Signed)
Scheduled per 8/19 los. Pt is aware of appt time an date on 9/30.

## 2020-01-10 ENCOUNTER — Encounter: Payer: Self-pay | Admitting: Genetic Counselor

## 2020-01-10 ENCOUNTER — Inpatient Hospital Stay: Payer: 59 | Attending: Hematology | Admitting: Genetic Counselor

## 2020-01-10 ENCOUNTER — Inpatient Hospital Stay: Payer: 59 | Admitting: Nutrition

## 2020-01-10 ENCOUNTER — Inpatient Hospital Stay: Payer: 59

## 2020-01-10 ENCOUNTER — Other Ambulatory Visit: Payer: Self-pay

## 2020-01-10 DIAGNOSIS — Z809 Family history of malignant neoplasm, unspecified: Secondary | ICD-10-CM | POA: Insufficient documentation

## 2020-01-10 DIAGNOSIS — Z79899 Other long term (current) drug therapy: Secondary | ICD-10-CM | POA: Insufficient documentation

## 2020-01-10 DIAGNOSIS — Z801 Family history of malignant neoplasm of trachea, bronchus and lung: Secondary | ICD-10-CM | POA: Insufficient documentation

## 2020-01-10 DIAGNOSIS — Z8049 Family history of malignant neoplasm of other genital organs: Secondary | ICD-10-CM | POA: Insufficient documentation

## 2020-01-10 DIAGNOSIS — C099 Malignant neoplasm of tonsil, unspecified: Secondary | ICD-10-CM | POA: Insufficient documentation

## 2020-01-10 DIAGNOSIS — E785 Hyperlipidemia, unspecified: Secondary | ICD-10-CM | POA: Insufficient documentation

## 2020-01-10 DIAGNOSIS — C090 Malignant neoplasm of tonsillar fossa: Secondary | ICD-10-CM | POA: Diagnosis not present

## 2020-01-10 DIAGNOSIS — Z800 Family history of malignant neoplasm of digestive organs: Secondary | ICD-10-CM | POA: Insufficient documentation

## 2020-01-10 NOTE — Progress Notes (Signed)
Nutrition follow-up completed with patient and wife. Patient is status post chemoradiation therapy for cancer of the head and neck. Reports he has started to introduce some soft foods such as applesauce, peaches, frozen yogurt, and soup. Reports oral nutrition supplements are too thick. He continues 6 cartons Osmolite 1.5 daily via PEG.  If he eats a meal he has been eliminating 2 cartons of Osmolite 1.5. Weight is stable and was documented as 270.4 pounds September 1 from 269.1 pounds August 19.  Nutrition diagnosis: Food and nutrition related knowledge deficit continues.  Estimated nutrition needs: 2500-2700 cal, 140-150 g protein, 2.7 L fluid.  Intervention: Increase oral intake to foods as tolerated at breakfast, lunch and dinner.  Provided additional education regarding foods that may be tolerated. Decrease Osmolite 1.5-5 cartons daily. Decrease 1 carton of Osmolite every week if oral intake increases.  May substitute boost plus by mouth for Osmolite 1.5 by tube if desired. Encourage patient to keep a food journal with documentation of foods well-tolerated. Questions were answered.  Teach back method used.  Monitoring, evaluation, goals: Patient will tolerate increased oral intake.  Will be able to decrease tube feeding while maintaining weight and continued healing.  Next visit: Thursday, September 30 after MD visit.  **Disclaimer: This note was dictated with voice recognition software. Similar sounding words can inadvertently be transcribed and this note may contain transcription errors which may not have been corrected upon publication of note.**

## 2020-01-10 NOTE — Progress Notes (Signed)
REFERRING PROVIDER: Brunetta Genera, MD [place],   [id number]  PRIMARY PROVIDER:  Claretta Fraise, MD  PRIMARY REASON FOR VISIT:  1. Malignant neoplasm of tonsillar fossa ([place])   2. Family history of uterine cancer   3. Family history of cancer      HISTORY OF PRESENT ILLNESS:   Dustin Rios, a 54 y.o. male, was seen for a [facility] cancer genetics consultation at the request of Dr. Irene Limbo due to a personal and family history of cancer.  Dustin Rios presents to clinic today to discuss the possibility of a hereditary predisposition to cancer, genetic testing, and to further clarify his future cancer risks, as well as potential cancer risks for family members.   In March 2021, at the age of 53, Dustin Rios was diagnosed with squamous cell carcinoma of the head and neck.  The treatment plan included a tonsillectomy, removal of soft tissue of his neck, chemotherapy and radiation.    CANCER HISTORY:  Oncology History  Squamous cell carcinoma of head and neck (HCC)  07/13/2019 Imaging   CT neck: IMPRESSION: 4.1 x 3.2 cm necrotic/cystic node or nodal conglomerate at the right level II station likely reflecting nodal metastatic disease. No definite primary mass is identified within the oral cavity, pharynx or larynx. Correlate with direct visualization and consider direct tissue sampling. Additionally, PET-CT may be helpful.   Cervical spondylosis as described. A prominent C6-C7 posterior disc osteophyte contributes to at least moderate bony spinal canal stenosis. Multilevel neural foraminal narrowing.   07/26/2019 Pathology Results   FINAL MICROSCOPIC DIAGNOSIS:   A. RIGHT CERVICAL LYMPH NODE, NEEDLE CORE BIOPSY:  - Squamous cell carcinoma, basaloid.  - No distinct nodal tissue identified.   P16 immunohistochemistry is POSITIVE.    08/10/2019 Initial Diagnosis   Squamous cell carcinoma of head and neck (Dustin Rios)   08/10/2019 Imaging   PET:  IMPRESSION: 1.  The 2.8 cm right level IIa lymph node has a maximum SUV of 28.4. A primary lesion is not readily seen. There is some asymmetric palatine tonsillar activity with the left tonsil having maximum SUV of 12.4 and the right 7.8, but no tonsillar mass is observed on the CT data. 2. No compelling findings of active malignancy in the chest, abdomen/pelvis, or skeleton. There is some accentuated anal activity which is likely physiologic given the lack of visible abnormality on the CT data. 3. Other imaging findings of potential clinical significance: Diffuse hepatic steatosis. Hypodense left renal lesion is probably a cyst but technically too small to characterize.   08/11/2019 Cancer Staging   Staging form: Pharynx - HPV-Mediated Oropharynx, AJCC 8th Edition - Clinical stage from 08/11/2019: Stage I (cT0, cN1, cM0, p16+) - Signed by Eppie Gibson, MD on 08/12/2019   Squamous cell carcinoma of right tonsil ([place]) (Resolved)  09/04/2019 Initial Diagnosis   Squamous cell carcinoma of right tonsil ([place])   10/10/2019 Cancer Staging   Staging form: Pharynx - HPV-Mediated Oropharynx, AJCC 8th Edition - Pathologic stage from 10/10/2019: Stage I (pT1, pN1, cM0, p16+) - Signed by Eppie Gibson, MD on 10/11/2019   Malignant neoplasm of tonsillar fossa (Dustin Rios)  09/11/2019 Initial Diagnosis   Malignant neoplasm of tonsillar fossa (Dustin Rios)   10/10/2019 Cancer Staging   Staging form: Pharynx - HPV-Mediated Oropharynx, AJCC 8th Edition - Pathologic stage from 10/10/2019: Stage I (pT1, pN1, cM0, p16+) - Signed by Eppie Gibson, MD on 10/11/2019   10/20/2019 -  Chemotherapy   The patient had dexamethasone (DECADRON) 4  MG tablet, 1 of 1 cycle, Start date: --, End date: -- palonosetron (ALOXI) injection 0.25 mg, 0.25 mg, Intravenous,  Once, 5 of 7 cycles Administration: 0.25 mg (10/20/2019), 0.25 mg (10/26/2019), 0.25 mg (11/02/2019), 0.25 mg (11/10/2019) CISplatin (PLATINOL) 100 mg in sodium chloride 0.9 % 500 mL chemo infusion, 39.3 mg/m2  = 102 mg, Intravenous,  Once, 5 of 7 cycles Administration: 100 mg (10/20/2019), 100 mg (10/26/2019), 100 mg (11/02/2019), 100 mg (11/10/2019) fosaprepitant (EMEND) 150 mg in sodium chloride 0.9 % 145 mL IVPB, 150 mg, Intravenous,  Once, 5 of 7 cycles Administration: 150 mg (10/20/2019), 150 mg (10/26/2019), 150 mg (11/02/2019), 150 mg (11/10/2019)  for chemotherapy treatment.       Past Medical History:  Diagnosis Date  . Arthritis   . Change in stool 02/22/2017  . Family history of cancer   . Family history of uterine cancer   . Gout 02/22/2017  . Hyperlipidemia   . Sleep apnea    Uses CPAP  . Tonsil cancer [facility])    metastatic    Past Surgical History:  Procedure Laterality Date  . CARPAL TUNNEL RELEASE Right 04/10/2019  . DIRECT LARYNGOSCOPY N/A 08/21/2019   Procedure: DIRECT LARYNGOSCOPY - WITH BX;  Surgeon: [name], MD;  Location: [facility];  Service: ENT;  Laterality: N/A;  . IR GASTROSTOMY TUBE MOD SED  10/13/2019  . IR IMAGING GUIDED PORT INSERTION  10/13/2019  . NECK DISSECTION  09/11/2019  . RADICAL NECK DISSECTION Right 09/11/2019   Procedure: RADICAL NECK DISSECTION;  Surgeon: [name], MD;  Location: [facility];  Service: ENT;  Laterality: Right;  . TONSILLECTOMY Bilateral 08/21/2019   Procedure: TONSILLECTOMY;  Surgeon: [name], MD;  Location: [facility];  Service: ENT;  Laterality: Bilateral;  . WISDOM TOOTH EXTRACTION      Social History   Socioeconomic History  . Marital status: Married    Spouse name: Not on file  . Number of children: 1  . Years of education: Not on file  . Highest education level: Not on file  Occupational History  . Occupation: Clinical biochemist  Tobacco Use  . Smoking status: Never Smoker  . Smokeless tobacco: Never Used  Vaping Use  . Vaping Use: Never used  Substance and Sexual Activity  . Alcohol use: No  . Drug use: No  . Sexual activity: Not on file  Other Topics Concern  . Not on file  Social  History Narrative   Patient is married and has 1 son.   Patient has never smoked, used tobacco products.   Patient does not drink.  Patient does not use illicit drugs.   Social Determinants of Health   Financial Resource Strain:   . Difficulty of Paying Living Expenses: Not on file  Food Insecurity:   . Worried About Charity fundraiser in the Last Year: Not on file  . Ran Out of Food in the Last Year: Not on file  Transportation Needs:   . Lack of Transportation (Medical): Not on file  . Lack of Transportation (Non-Medical): Not on file  Physical Activity:   . Days of Exercise per Week: Not on file  . Minutes of Exercise per Session: Not on file  Stress:   . Feeling of Stress : Not on file  Social Connections:   . Frequency of Communication with Friends and Family: Not on file  . Frequency of Social Gatherings with Friends and Family: Not on file  . Attends Religious Services:  Not on file  . Active Member of Clubs or Organizations: Not on file  . Attends Archivist Meetings: Not on file  . Marital Status: Not on file     FAMILY HISTORY:  We obtained a detailed, 4-generation family history.  Significant diagnoses are listed below: Family History  Problem Relation Age of Onset  . COPD Mother   . Uterine cancer Mother 47  . Lung cancer Brother 43       smoker  . Lung cancer Maternal Grandfather   . Stomach cancer Paternal Grandmother        d. 60s  . Cervical cancer Maternal Aunt   . Lung cancer Maternal Grandmother        smoker  . Prostate cancer Paternal Grandfather   . Pancreatic cancer Cousin 79       mother's maternal first cousin  . Stomach cancer Cousin 35       mother's maternal first cousin  . Cancer Cousin        mother's maternal first cousin with NOS cancer  . Cancer Other        MGMs sister with cancer NOS  . Lung cancer Other        smoker  . Cancer Other        mother's maternal grandfather with NOS cancer    The patient has one son  and two granddaughters who are cancer free.  He has one brother who died earlier this year from lung cancer.  Both parents are living.  The patient's father is 39 and cancer free. He is an only child.  His mother died of stomach cancer and his father died of a heart attack but did have prostate cancer.  The patient's mother had uterine cancer at 80.  She has a maternal half brother and sister.  The sister had cervical cancer.  Reportedly she had genetic testing and was negative.  The maternal grandmother had lung cancer due to smoking.  She had two sisters, one who had an unknown cancer and had a daughter with an unknown cancer, the other sister had lung cancer and had a son with pancreatic cancer and another son with liver cancer.  The maternal great grandfather had an unknown cancer.  Mr. Welchel is unaware of previous family history of genetic testing for hereditary cancer risks. Patient's maternal ancestors are of Scottish/Irish/English descent, and paternal ancestors are of [place] descent. There is no reported Ashkenazi Jewish ancestry. There is no known consanguinity.  GENETIC COUNSELING ASSESSMENT: Mr. Matthews is a 54 y.o. male with a personal and family history of cancer which is somewhat suggestive of a sporadic or environmental predisposition to cancer given the type of cancer in the family and older ages of onset. We, therefore, discussed and recommended the following at today's visit.   DISCUSSION: We discussed that 5 - 10% of cancer is hereditary, with most cases of cancer being sporadic and associated with exposures or lifestyle.  Examples of exposures could be due to viruses such as HPV, work exposures such as benzene, gases like radon, or other chemicals, or lifestyle such as smoking.  Most of the cancers in Mr. Figgs family are less likely to be associated with hereditary causes.  There are some individuals who were identified as having cancer, but the type of cancer was not know.  His  mother had uterine cancer and his mother's first cousin had pancreatic cancer.  These two cancers are related through Lynch syndrome, but at  this time Mr. Rambert does not qualify for testing since his mother's cousin is too distantly related to Mr. Mckamie.  Despite that he does not qualify for testing, he could still pay $250 out of pocket to undergo genetic testing.  We discussed that testing is beneficial for several reasons including knowing how to follow individuals after completing their treatment and understand if other family members could be at risk for cancer and allow them to undergo genetic testing.   We reviewed the characteristics, features and inheritance patterns of hereditary cancer syndromes. We also discussed genetic testing, including the appropriate family members to test, the process of testing, insurance coverage and turn-around-time for results. We discussed the implications of a negative, positive, carrier and/or variant of uncertain significant result. We recommended that if Mr. Salminen pursued genetic testing, we would consider the multi-cancer panel.  The Multi-Gene Panel offered by [facility] includes sequencing and/or deletion duplication testing of the following 85 genes: AIP, ALK, APC, ATM, AXIN2,BAP1,  BARD1, BLM, BMPR1A, BRCA1, BRCA2, BRIP1, CASR, CDC73, CDH1, CDK4, CDKN1B, CDKN1C, CDKN2A (p14ARF), CDKN2A (p16INK4a), CEBPA, CHEK2, CTNNA1, DICER1, DIS3L2, EGFR (c.2369C>T, p.Thr790Met variant only), EPCAM (Deletion/duplication testing only), FH, FLCN, GATA2, GPC3, GREM1 (Promoter region deletion/duplication testing only), HOXB13 (c.251G>A, p.Gly84Glu), HRAS, KIT, MAX, MEN1, MET, MITF (c.952G>A, p.Glu318Lys variant only), MLH1, MSH2, MSH3, MSH6, MUTYH, NBN, NF1, NF2, NTHL1, PALB2, PDGFRA, PHOX2B, PMS2, POLD1, POLE, POT1, PRKAR1A, PTCH1, PTEN, RAD50, RAD51C, RAD51D, RB1, RECQL4, RET, RNF43, RUNX1, SDHAF2, SDHA (sequence changes only), SDHB, SDHC, SDHD, SMAD4, SMARCA4, SMARCB1, SMARCE1,  STK11, SUFU, TERC, TERT, TMEM127, TP53, TSC1, TSC2, VHL, WRN and WT1.     We discussed that some people do not want to undergo genetic testing due to fear of genetic discrimination.  A federal law called the Genetic Information Non-Discrimination Act (GINA) of 2008 helps protect individuals against genetic discrimination based on their genetic test results.  It impacts both health insurance and employment.  With health insurance, it protects against increased premiums, being kicked off insurance or being forced to take a test in order to be insured.  For employment it protects against hiring, firing and promoting decisions based on genetic test results.  Health status due to a cancer diagnosis is not protected under GINA.   We discussed with Mr. Duval that the personal and family history does not meet insurance or NCCN criteria for genetic testing and, therefore, is not highly consistent with a familial hereditary cancer syndrome.  We feel he is at low risk to harbor a gene mutation associated with such a condition. Thus, we did not recommend any genetic testing, at this time, and recommended Mr. Castrogiovanni continue to follow the cancer screening guidelines given by his primary healthcare provider.  PLAN: Mr. Brannen did not wish to pursue genetic testing at today's visit. We understand this decision and remain available to coordinate genetic testing at any time in the future. We, therefore, recommend Mr. Kluger continue to follow the cancer screening guidelines given by his primary healthcare provider.  Lastly, we encouraged Mr. Wakefield to remain in contact with cancer genetics annually so that we can continuously update the family history and inform him of any changes in cancer genetics and testing that may be of benefit for this family.   Mr. Lolli questions were answered to his satisfaction today. Our contact information was provided should additional questions or concerns arise. Thank you for the  referral and allowing [place] to share in the care of your patient.   Rosielee Corporan P. Florene Glen,  MS, [place] Licensed, Insurance risk surveyor [email or web address] phone: [phone]  The patient was seen for a total of 45 minutes in face-to-face genetic counseling.  This patient was discussed with Drs. Magrinat, Lindi Adie and/or Burr Medico who agrees with the above.    _______________________________________________________________________ For Office Staff:  Number of people involved in session: 2 Was an Intern/ student involved with case: no

## 2020-01-11 ENCOUNTER — Ambulatory Visit: Payer: 59 | Attending: Radiation Oncology

## 2020-01-11 ENCOUNTER — Other Ambulatory Visit: Payer: Self-pay

## 2020-01-11 DIAGNOSIS — R1310 Dysphagia, unspecified: Secondary | ICD-10-CM | POA: Diagnosis present

## 2020-01-11 NOTE — Therapy (Signed)
[facility] [address] [place], [place], [address] Phone: [phone]   Fax:  [phone]  Speech Language Pathology Treatment  Patient Details  Name: Dustin Rekowski Sr. MRN: [id number] Date of Birth: 26-Apr-1966 Referring Provider (SLP): Eppie Gibson, MD   Encounter Date: 01/11/2020   End of Session - 01/11/20 1622    Visit Number 4    Number of Visits 7    Date for SLP Re-Evaluation 03/29/20    SLP Start Time 2683    SLP Stop Time  1612    SLP Time Calculation (min) 34 min    Activity Tolerance Patient tolerated treatment well           Past Medical History:  Diagnosis Date  . Arthritis   . Change in stool 02/22/2017  . Family history of cancer   . Family history of uterine cancer   . Gout 02/22/2017  . Hyperlipidemia   . Sleep apnea    Uses CPAP  . Tonsil cancer [facility])    metastatic    Past Surgical History:  Procedure Laterality Date  . CARPAL TUNNEL RELEASE Right 04/10/2019  . DIRECT LARYNGOSCOPY N/A 08/21/2019   Procedure: DIRECT LARYNGOSCOPY - WITH BX;  Surgeon: [name], MD;  Location: [facility];  Service: ENT;  Laterality: N/A;  . IR GASTROSTOMY TUBE MOD SED  10/13/2019  . IR IMAGING GUIDED PORT INSERTION  10/13/2019  . NECK DISSECTION  09/11/2019  . RADICAL NECK DISSECTION Right 09/11/2019   Procedure: RADICAL NECK DISSECTION;  Surgeon: [name], MD;  Location: [facility];  Service: ENT;  Laterality: Right;  . TONSILLECTOMY Bilateral 08/21/2019   Procedure: TONSILLECTOMY;  Surgeon: [name], MD;  Location: [facility];  Service: ENT;  Laterality: Bilateral;  . WISDOM TOOTH EXTRACTION      There were no vitals filed for this visit.   Subjective Assessment - 01/11/20 1544    Subjective Pt is eating softer foods/puree and drinking liquids over the last few days.    Currently in Pain? No/denies                 ADULT SLP TREATMENT - 01/11/20 1546       General Information   Behavior/Cognition Alert;Cooperative;Pleasant mood      Treatment Provided   Treatment provided Dysphagia      Dysphagia Treatment   Temperature Spikes Noted No    Respiratory Status Room air    Treatment Methods Skilled observation;Therapeutic exercise    Patient observed directly with PO's Yes    Type of PO's observed Thin liquids   pt politely refused applesauce    Liquids provided via Cup    Oral Phase Signs & Symptoms --   none noted   Pharyngeal Phase Signs & Symptoms Immediate cough   cough on 1/10 sips    Other treatment/comments Pt just begun to eat more in last 3-4 days, so still PEG dependent. He still complains of thick saliva. "I think I overdid it for lunch" pt stated, "I almost vomited after lunch of Ensure and chicken noodle soup." This is why he refused applesauce today. Pt coughed immediately on 1/10 sips water - no overt s/sx aspiration PNA. SLP provided pt with overt s/sx aspiration PNA and pt told SLP 3 of these with mod I. Pt was independent with HEP as well. SLP told pt about food journal; Pt stated he thought that would be a good idea. SLP recommended pt talk to  his rad onc or ENT re: PT for his rt shoulder ROM -unable to lift over his shoulder.     Assessment / Recommendations / Plan   Plan Continue with current plan of care   ? every other month next visit?     Dysphagia Recommendations   Diet recommendations --   as tolerated   Medication Administration --   as tolerated     Progression Toward Goals   Progression toward goals Progressing toward goals            SLP Education - 01/11/20 1622    Education Details food journal, overt s/sx aspiration PNA    Person(s) Educated Patient    Methods Explanation;Handout    Comprehension Verbalized understanding            SLP Short Term Goals - 12/14/19 1556      SLP SHORT TERM GOAL #1   Title Pt will demo knowledge of rationale for HEP completion    Period --   sessions, for all  STGs   Status Achieved      SLP SHORT TERM GOAL #2   Title Pt will complete HEP with rare min A over two sessions    Baseline 11-09-19, 12-14-19    Status Achieved      SLP SHORT TERM GOAL #3   Title Pt will tell SLP how a food journal can hasten/facilitate return to more normalized diet in 2 sessions    Baseline 12-14-19    Time 2    Status Revised      SLP SHORT TERM GOAL #4   Title Pt will tell SLP 3 overt s/s aspiration PNA with modified independence    Status Achieved            SLP Long Term Goals - 01/11/20 1624      SLP LONG TERM GOAL #1   Title Pt will complete HEP with modified independence over 3 visits    Baseline 11-09-19, 01-11-20    Time 2    Period --   sessions, for all LTGs   Status On-going      SLP LONG TERM GOAL #2   Title Pt will tell SLP when to decr frequency of HEP    Time 2    Status On-going            Plan - 01/11/20 1623    Clinical Impression Statement Pt presents today with WNL/WFL swallowing ability with water - cough 1/10 sips. Pt POs mostly water daily, has just started more regular solids 3-4 days ago - still PEG dependent. No overt s/sx aspiration PNA reported or observed today. Data suggests that as pts progress through rad or chemorad therapy that their swallowing ability will decrease. Also, WNL swallowing is threatened by muscle fibrosis that will likely develop after rad/chemorad is completed. Skilled ST would cont to be beneficial to the pt in order to regularly assess pt's safety with POs and/or need for instrumental swallow assessment, as well as to assess accurate completion of swallowing HEP.    Speech Therapy Frequency --   once approx every 4 weeks   Duration --   7 total sessions   Treatment/Interventions Aspiration precaution training;Pharyngeal strengthening exercises;Diet toleration management by SLP;Trials of upgraded texture/liquids;Patient/family education;SLP instruction and feedback    Potential to [place] provided today    Consulted and Agree with Plan of Care Patient  Patient will benefit from skilled therapeutic intervention in order to improve the following deficits and impairments:   Dysphagia, unspecified type    Problem List Patient Active Problem List   Diagnosis Date Noted  . Family history of uterine cancer   . Family history of cancer   . Port-A-Cath in place 12/28/2019  . Hematuria 12/18/2019  . Counseling regarding advance care planning and goals of care 10/12/2019  . Malignant neoplasm of tonsillar fossa ([place]) 09/11/2019  . S/P tonsillectomy 08/21/2019  . Squamous cell carcinoma of head and neck ([place]) 08/10/2019  . Morbid obesity ([place]) 06/08/2019  . Moderate mixed hyperlipidemia not requiring statin therapy 06/08/2019  . OSA (obstructive sleep apnea) 06/08/2019  . Change in stool 02/22/2017  . Gout 02/22/2017    Betzaira Mentel ,MS, CCC-SLP  01/11/2020, 4:25 PM  [facility] [address] [place], [place], [address] Phone: [phone]   Fax:  [phone]   Name: Dustin Lukas Sr. MRN: [id number] Date of Birth: January 03, 1966

## 2020-01-19 NOTE — Progress Notes (Signed)
  Patient Name: Dustin Rios MRN: [id number] DOB: March 23, 1966 Referring Physician: Claretta Fraise (Profile Not Attached) Date of Service: 11/30/2019 [facility], [place]                                                        End Of Treatment Note  Diagnoses: C09.0-Malignant neoplasm of tonsillar fossa C76.0-Malignant neoplasm of head, face and neck  Cancer Staging: Cancer Staging Malignant neoplasm of tonsillar fossa ([place]) Staging form: Pharynx - HPV-Mediated Oropharynx, AJCC 8th Edition - Pathologic stage from 10/10/2019: Stage I (pT1, pN1, cM0, p16+) - Signed by Eppie Gibson, MD on 10/11/2019  Squamous cell carcinoma of head and neck ([place]) Staging form: Pharynx - HPV-Mediated Oropharynx, AJCC 8th Edition - Clinical stage from 08/11/2019: Stage I (cT0, cN1, cM0, p16+) - Signed by Eppie Gibson, MD on 08/12/2019   Intent: Curative  Radiation Treatment Dates: 10/19/2019 through 11/30/2019 Site Technique Total Dose (Gy) Dose per Fx (Gy) Completed Fx Beam Energies  Tonsil, Right: HN_Rt_tonsil IMRT 60/60 2 30/30 6X   Narrative: The patient tolerated radiation therapy relatively well.   Plan: The patient will follow-up with radiation oncology in 2 weeks. -----------------------------------  Eppie Gibson, MD

## 2020-01-31 ENCOUNTER — Other Ambulatory Visit: Payer: Self-pay

## 2020-01-31 ENCOUNTER — Emergency Department (HOSPITAL_COMMUNITY)
Admission: EM | Admit: 2020-01-31 | Discharge: 2020-02-01 | Disposition: A | Discharge status: Home / Self Care | Payer: 59 | Attending: Emergency Medicine | Admitting: Emergency Medicine

## 2020-01-31 DIAGNOSIS — R112 Nausea with vomiting, unspecified: Secondary | ICD-10-CM | POA: Insufficient documentation

## 2020-01-31 DIAGNOSIS — R1011 Right upper quadrant pain: Secondary | ICD-10-CM | POA: Insufficient documentation

## 2020-01-31 LAB — CBC
HCT: 37 % — ABNORMAL LOW (ref 39.0–52.0)
Hemoglobin: 12.1 g/dL — ABNORMAL LOW (ref 13.0–17.0)
MCH: 32.4 pg (ref 26.0–34.0)
MCHC: 32.7 g/dL (ref 30.0–36.0)
MCV: 98.9 fL (ref 80.0–100.0)
Platelets: 175 10*3/uL (ref 150–400)
RBC: 3.74 MIL/uL — ABNORMAL LOW (ref 4.22–5.81)
RDW: 13.8 % (ref 11.5–15.5)
WBC: 7.7 10*3/uL (ref 4.0–10.5)
nRBC: 0 % (ref 0.0–0.2)

## 2020-01-31 NOTE — ED Triage Notes (Signed)
Patient states that he has had nausea, vomiting, and weakness that started today. Patient complains of abdominal pain and right flank pain. 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 Patient's last treatment was six weeks ago.

## 2020-02-01 ENCOUNTER — Emergency Department (HOSPITAL_COMMUNITY): Payer: 59

## 2020-02-01 LAB — BASIC METABOLIC PANEL
Anion gap: 11 (ref 5–15)
BUN: 19 mg/dL (ref 6–20)
CO2: 25 mmol/L (ref 22–32)
Calcium: 8.8 mg/dL — ABNORMAL LOW (ref 8.9–10.3)
Chloride: 99 mmol/L (ref 98–111)
Creatinine, Ser: 1.19 mg/dL (ref 0.61–1.24)
GFR calc Af Amer: >60 mL/min (ref >60)
GFR calc non Af Amer: >60 mL/min (ref >60)
Glucose, Bld: 139 mg/dL — ABNORMAL HIGH (ref 70–99)
Potassium: 3.7 mmol/L (ref 3.5–5.1)
Sodium: 135 mmol/L (ref 135–145)

## 2020-02-01 LAB — HEPATIC FUNCTION PANEL
ALT: 29 U/L (ref 0–44)
AST: 25 U/L (ref 15–41)
Albumin: 3.9 g/dL (ref 3.5–5.0)
Alkaline Phosphatase: 58 U/L (ref 38–126)
Bilirubin, Direct: 0.1 mg/dL (ref 0.0–0.2)
Indirect Bilirubin: 0.8 mg/dL (ref 0.3–0.9)
Total Bilirubin: 0.9 mg/dL (ref 0.3–1.2)
Total Protein: 7.1 g/dL (ref 6.5–8.1)

## 2020-02-01 LAB — LIPASE, BLOOD: Lipase: 24 U/L (ref 11–51)

## 2020-02-01 LAB — URINALYSIS, ROUTINE W REFLEX MICROSCOPIC
Bilirubin Urine: NEGATIVE
Glucose, UA: NEGATIVE mg/dL
Hgb urine dipstick: NEGATIVE
Ketones, ur: 5 mg/dL — AB
Leukocytes,Ua: NEGATIVE
Nitrite: NEGATIVE
Protein, ur: NEGATIVE mg/dL
Specific Gravity, Urine: 1.023 (ref 1.005–1.030)
pH: 6 (ref 5.0–8.0)

## 2020-02-01 LAB — D-DIMER, QUANTITATIVE: D-Dimer, Quant: 0.53 ug/mL-FEU — ABNORMAL HIGH (ref 0.00–0.50)

## 2020-02-01 MED ORDER — DICYCLOMINE HCL 20 MG PO TABS: 20.0000 mg | ORAL_TABLET | Freq: Four times a day (QID) | ORAL | 0 refills | Status: DC | PRN

## 2020-02-01 MED ORDER — KETOROLAC TROMETHAMINE 30 MG/ML IJ SOLN
30.0000 mg | Freq: Once | INTRAMUSCULAR | Status: AC
  Administered 2020-02-01: 30 mg via INTRAVENOUS
  Filled 2020-02-01: qty 1

## 2020-02-01 MED ORDER — ONDANSETRON HCL 4 MG PO TABS: 4.0000 mg | ORAL_TABLET | Freq: Three times a day (TID) | ORAL | 0 refills | Status: DC | PRN

## 2020-02-01 MED ORDER — ONDANSETRON HCL 4 MG/2ML IJ SOLN
4.0000 mg | Freq: Once | INTRAMUSCULAR | Status: AC
  Administered 2020-02-01: 4 mg via INTRAVENOUS
  Filled 2020-02-01: qty 2

## 2020-02-01 MED ORDER — IOHEXOL 350 MG/ML SOLN
100.0000 mL | Freq: Once | INTRAVENOUS | Status: AC | PRN
  Administered 2020-02-01: 100 mL via INTRAVENOUS

## 2020-02-01 MED ORDER — DICYCLOMINE HCL 10 MG/ML IM SOLN
20.0000 mg | Freq: Once | INTRAMUSCULAR | Status: AC
  Administered 2020-02-01: 20 mg via INTRAMUSCULAR
  Filled 2020-02-01: qty 2

## 2020-02-01 MED ORDER — FENTANYL CITRATE (PF) 100 MCG/2ML IJ SOLN
50.0000 ug | Freq: Once | INTRAMUSCULAR | Status: AC
  Administered 2020-02-01: 50 ug via INTRAVENOUS
  Filled 2020-02-01: qty 2

## 2020-02-01 MED ORDER — HEPARIN SOD (PORK) LOCK FLUSH 100 UNIT/ML IV SOLN
500.0000 [IU] | Freq: Once | INTRAVENOUS | Status: AC
  Administered 2020-02-01: 500 [IU] via INTRAVENOUS

## 2020-02-01 NOTE — ED Provider Notes (Addendum)
[facility] EMERGENCY DEPARTMENT Provider Note   CSN: [id number] Arrival date & time: 01/31/20  2236   Time seen 12:11 AM  History Chief Complaint  Patient presents with  . Emesis    abdominal/flank pain    Dustin Standre Sr. is a 54 y.o. male.  HPI   Patient states he was diagnosed with tonsillar cancer in February 2021.  Reviewing his chart he had squamous cell carcinoma of the head and neck.  He states he had a radical right neck dissection done and removal the tonsil and adenoids.  He has had chemotherapy and radiation therapy.  He has had a PEG tube.  He states he is now able to eat some soft foods such as oatmeal.  He states about 6:30 PM he was just sitting and doing nothing and had acute onset of right-sided upper abdominal pain.  It does not radiate.  The pain is constant.  He described this as cramping.  He denies cough, shortness of breath, or being pleuritic.  Nothing he does makes it hurt more, nothing he does makes it feel better.  About 2 hours ago he had nausea and vomited about 4 times without bleeding.  He denies diarrhea but states he has had some constipation.  He denies hematuria.  He states he is never had this pain before.  As far as family history his son had a kidney stone once at age 18 and his father had a kidney stone once.  His father has also had gallbladder problems.  Patient states he has lost 45 pounds recently and he does not know why.  PCP Claretta Fraise, MD   Past Medical History:  Diagnosis Date  . Arthritis   . Change in stool 02/22/2017  . Family history of cancer   . Family history of uterine cancer   . Gout 02/22/2017  . Hyperlipidemia   . Sleep apnea    Uses CPAP  . Tonsil cancer [facility])    metastatic    Patient Active Problem List   Diagnosis Date Noted  . Family history of uterine cancer   . Family history of cancer   . Port-A-Cath in place 12/28/2019  . Hematuria 12/18/2019  . Counseling regarding advance care planning and goals  of care 10/12/2019  . Malignant neoplasm of tonsillar fossa ([place]) 09/11/2019  . S/P tonsillectomy 08/21/2019  . Squamous cell carcinoma of head and neck ([place]) 08/10/2019  . Morbid obesity ([place]) 06/08/2019  . Moderate mixed hyperlipidemia not requiring statin therapy 06/08/2019  . OSA (obstructive sleep apnea) 06/08/2019  . Change in stool 02/22/2017  . Gout 02/22/2017    Past Surgical History:  Procedure Laterality Date  . CARPAL TUNNEL RELEASE Right 04/10/2019  . DIRECT LARYNGOSCOPY N/A 08/21/2019   Procedure: DIRECT LARYNGOSCOPY - WITH BX;  Surgeon: [name], MD;  Location: [place];  Service: ENT;  Laterality: N/A;  . IR GASTROSTOMY TUBE MOD SED  10/13/2019  . IR IMAGING GUIDED PORT INSERTION  10/13/2019  . NECK DISSECTION  09/11/2019  . RADICAL NECK DISSECTION Right 09/11/2019   Procedure: RADICAL NECK DISSECTION;  Surgeon: [name], MD;  Location: [place];  Service: ENT;  Laterality: Right;  . TONSILLECTOMY Bilateral 08/21/2019   Procedure: TONSILLECTOMY;  Surgeon: [name], MD;  Location: [place];  Service: ENT;  Laterality: Bilateral;  . WISDOM TOOTH EXTRACTION         Family History  Problem Relation Age of Onset  . COPD Mother   .  Uterine cancer Mother 19  . Lung cancer Brother 62       smoker  . Lung cancer Maternal Grandfather   . Stomach cancer Paternal Grandmother        d. 35s  . Cervical cancer Maternal Aunt   . Lung cancer Maternal Grandmother        smoker  . Prostate cancer Paternal Grandfather   . Pancreatic cancer Cousin 82       mother's maternal first cousin  . Stomach cancer Cousin 32       mother's maternal first cousin  . Cancer Cousin        mother's maternal first cousin with NOS cancer  . Cancer Other        MGMs sister with cancer NOS  . Lung cancer Other        smoker  . Cancer Other        mother's maternal grandfather with NOS cancer    Social History   Tobacco Use  . Smoking status: Never  Smoker  . Smokeless tobacco: Never Used  Vaping Use  . Vaping Use: Never used  Substance Use Topics  . Alcohol use: No  . Drug use: No    Home Medications Prior to Admission medications   Medication Sig Start Date End Date Taking? Authorizing Provider  Colchicine (MITIGARE) 0.6 MG CAPS Take 2 capsules at onset of flare. Then take 1 capsule 1 hour later.  May take 1 capsule up to twice daily thereafter. 04/04/19   Janora Norlander, DO  dicyclomine (BENTYL) 20 MG tablet Take 1 tablet (20 mg total) by mouth 4 (four) times daily as needed for spasms (abdominal or intestinal spasms). 02/01/20   Rolland Porter, MD  HYDROcodone-acetaminophen (HYCET) 7.5-325 mg/15 ml solution Take 15 mLs by mouth 4 (four) times daily as needed for moderate pain. 09/14/19   [name], MD  lidocaine-prilocaine (EMLA) cream  10/18/19   [provider]  loratadine (EQ LORATADINE) 10 MG tablet Take 10 mg by mouth daily.     [provider]  LORazepam (ATIVAN) 0.5 MG tablet Take 1-2 tablets (0.5-1 mg total) by mouth every 8 (eight) hours as needed for anxiety or sleep (chemotherapy related nausea). 12/05/19   Brunetta Genera, MD  Nutritional Supplements (FEEDING SUPPLEMENT, OSMOLITE 1.5 CAL,) LIQD Begin Osmolite 1.5 via PEG.  Advance as tolerated to goal rate of 7 cartons daily in 4 feedings with 120 mL of free water before and after each bolus feeding.  In addition mix 30 mL Prosource with 60 mL of water 4 times daily and infuse into PEG with 60 mL free water after each Prosource administration.  Drink an additional 10 ounces of water daily or put into PEG.  If it provides 2885 cal, 144.3 g protein and 2887 mL free water/100% estimated needs. 11/10/19   Eppie Gibson, MD  ondansetron (ZOFRAN) 4 MG tablet Take 1 tablet (4 mg total) by mouth every 8 (eight) hours as needed for nausea or vomiting. 02/01/20   Rolland Porter, MD  pantoprazole (PROTONIX) 40 MG tablet Take 1 tablet (40 mg total) by mouth daily before  breakfast. 10/25/19   Brunetta Genera, MD  prochlorperazine (COMPAZINE) 10 MG tablet Take 1 tablet (10 mg total) by mouth every 6 (six) hours as needed for nausea or vomiting. 10/18/19   Brunetta Genera, MD  promethazine (PHENERGAN) 25 MG suppository Place 1 suppository (25 mg total) rectally every 6 (six) hours as needed for nausea or  vomiting. 09/14/19   [name], MD  senna-docusate (SENNA S) 8.6-50 MG tablet Take 2 tablets by mouth at bedtime as needed for mild constipation or moderate constipation. 10/25/19   Brunetta Genera, MD  sodium fluoride (FLUORISHIELD) 1.1 % GEL dental gel APPLY TO TOOTHBRUSH OR IN FLUORIDE TRAYS AS DIRECTED. REPEAT NIGHTLY. SPIT OUT EXCESS DO NOT SWALLOW. DO NOT RINSE AFTERWARDS 10/17/19   [provider]  sodium fluoride (PREVIDENT 5000 PLUS) 1.1 % CREA dental cream Apply to tooth brush or in fluoride trays as directed.  Repeat nightly. Spit out excess-DO NOT swallow. DO NOT rinse afterwards. 08/18/19   Lenn Cal, DDS  tamsulosin (FLOMAX) 0.4 MG CAPS capsule Take 1 capsule (0.4 mg total) by mouth daily. 11/03/19   Harle Stanford., PA-C    Allergies    Patient has no known allergies.  Review of Systems   Review of Systems  All other systems reviewed and are negative.   Physical Exam Updated Vital Signs BP (!) 95/59   Pulse 78   Temp 97.8 F (36.6 C) (Oral)   Resp 18   Ht 5\' 8"  (1.727 m)   Wt 117.9 kg   SpO2 96%   BMI 39.53 kg/m   Physical Exam Vitals and nursing note reviewed.  Constitutional:      General: He is not in acute distress.    Appearance: He is obese. He is not ill-appearing or toxic-appearing.  HENT:     Head: Normocephalic and atraumatic.     Right Ear: External ear normal.     Left Ear: External ear normal.  Eyes:     General: No scleral icterus.    Extraocular Movements: Extraocular movements intact.     Conjunctiva/sclera: Conjunctivae normal.     Pupils: Pupils are equal, round, and reactive to light.   Cardiovascular:     Rate and Rhythm: Normal rate and regular rhythm.     Pulses: Normal pulses.     Heart sounds: Normal heart sounds. No murmur heard.   Pulmonary:     Effort: Pulmonary effort is normal. No respiratory distress.     Breath sounds: Normal breath sounds.  Abdominal:     General: There is distension.     Palpations: Abdomen is soft.     Tenderness: There is no abdominal tenderness. There is no guarding or rebound.       Comments: Area of pain noted but he is nontender to palpation in that area.  Musculoskeletal:        General: Normal range of motion.     Cervical back: Normal range of motion.  Skin:    General: Skin is warm and dry.  Neurological:     General: No focal deficit present.     Mental Status: He is alert and oriented to person, place, and time.     Cranial Nerves: No cranial nerve deficit.  Psychiatric:        Mood and Affect: Mood normal.        Behavior: Behavior normal.        Thought Content: Thought content normal.     ED Results / Procedures / Treatments   Labs (all labs ordered are listed, but only abnormal results are displayed) Results for orders placed or performed during the hospital encounter of 01/31/20  CBC  Result Value Ref Range   WBC 7.7 4.0 - 10.5 K/uL   RBC 3.74 (L) 4.22 - 5.81 MIL/uL   Hemoglobin 12.1 (L) 13.0 -  17.0 g/dL   HCT 37.0 (L) 39 - 52 %   MCV 98.9 80.0 - 100.0 fL   MCH 32.4 26.0 - 34.0 pg   MCHC 32.7 30.0 - 36.0 g/dL   RDW 13.8 11.5 - 15.5 %   Platelets 175 150 - 400 K/uL   nRBC 0.0 0.0 - 0.2 %  Basic metabolic panel  Result Value Ref Range   Sodium 135 135 - 145 mmol/L   Potassium 3.7 3.5 - 5.1 mmol/L   Chloride 99 98 - 111 mmol/L   CO2 25 22 - 32 mmol/L   Glucose, Bld 139 (H) 70 - 99 mg/dL   BUN 19 6 - 20 mg/dL   Creatinine, Ser 1.19 0.61 - 1.24 mg/dL   Calcium 8.8 (L) 8.9 - 10.3 mg/dL   GFR calc non Af Amer >60 >60 mL/min   GFR calc Af Amer >60 >60 mL/min   Anion gap 11 5 - 15  Urinalysis,  Routine w reflex microscopic Urine, Clean Catch  Result Value Ref Range   Color, Urine YELLOW YELLOW   APPearance CLEAR CLEAR   Specific Gravity, Urine 1.023 1.005 - 1.030   pH 6.0 5.0 - 8.0   Glucose, UA NEGATIVE NEGATIVE mg/dL   Hgb urine dipstick NEGATIVE NEGATIVE   Bilirubin Urine NEGATIVE NEGATIVE   Ketones, ur 5 (A) NEGATIVE mg/dL   Protein, ur NEGATIVE NEGATIVE mg/dL   Nitrite NEGATIVE NEGATIVE   Leukocytes,Ua NEGATIVE NEGATIVE  Lipase, blood  Result Value Ref Range   Lipase 24 11 - 51 U/L  D-dimer, quantitative  Result Value Ref Range   D-Dimer, Quant 0.53 (H) 0.00 - 0.50 ug/mL-FEU  Hepatic function panel  Result Value Ref Range   Total Protein 7.1 6.5 - 8.1 g/dL   Albumin 3.9 3.5 - 5.0 g/dL   AST 25 15 - 41 U/L   ALT 29 0 - 44 U/L   Alkaline Phosphatase 58 38 - 126 U/L   Total Bilirubin 0.9 0.3 - 1.2 mg/dL   Bilirubin, Direct 0.1 0.0 - 0.2 mg/dL   Indirect Bilirubin 0.8 0.3 - 0.9 mg/dL   Laboratory interpretation all normal except insignificant elevation of D-dimer, mild anemia    EKG None  Radiology CT Abdomen Pelvis Wo Contrast  Result Date: 02/01/2020 CLINICAL DATA:  Nausea, vomiting, right flank pain EXAM: CT ABDOMEN AND PELVIS WITHOUT CONTRAST TECHNIQUE: Multidetector CT imaging of the abdomen and pelvis was performed following the standard protocol without IV contrast. COMPARISON:  07/08/2016 FINDINGS: Lower chest: The visualized lung bases are clear bilaterally. The visualized heart and pericardium are unremarkable. 2.6 cm subcutaneous rounded soft tissue lesion is seen within the a anterior abdominal wall anterior to the sternum which is nonspecific but may represent a sebaceous cyst. Hepatobiliary: No focal liver abnormality is seen. No gallstones, gallbladder wall thickening, or biliary dilatation. Pancreas: Unremarkable Spleen: Unremarkable Adrenals/Urinary Tract: The adrenal glands are unremarkable. The kidneys are normal in size and position. 3 mm  nonobstructing calculus is seen within the upper pole of the right kidney. No additional nephro or urolithiasis. No hydronephrosis. The bladder is unremarkable. Stomach/Bowel: Gastrostomy catheter in expected position within the distal body of the stomach. The small and large bowel are unremarkable. Appendix normal. No free intraperitoneal gas or fluid. Moderate fat containing umbilical hernia. Vascular/Lymphatic: The abdominal vasculature is normal on this noncontrast examination. No pathologic adenopathy within the abdomen and pelvis. Reproductive: Prostate is unremarkable. Other: Rectum unremarkable. Musculoskeletal: No lytic or blastic bone lesions are seen. Degenerative  changes are seen within the lumbar spine. IMPRESSION: 3 mm nonobstructing right renal calculus. No hydronephrosis. No urolithiasis. Electronically Signed   By: Fidela Salisbury MD   On: 02/01/2020 01:17   CT Angio Chest PE W/Cm &/Or Wo Cm  Result Date: 02/01/2020 CLINICAL DATA:  Nausea and vomiting with weakness that started today. Abdominal and right flank pain. Chemotherapy and radiation for head neck cancer EXAM: CT ANGIOGRAPHY CHEST WITH CONTRAST TECHNIQUE: Multidetector CT imaging of the chest was performed using the standard protocol during bolus administration of intravenous contrast. Multiplanar CT image reconstructions and MIPs were obtained to evaluate the vascular anatomy. CONTRAST:  151mL OMNIPAQUE IOHEXOL 350 MG/ML SOLN COMPARISON:  None. FINDINGS: Cardiovascular: Satisfactory opacification of the pulmonary arteries to the segmental level. No evidence of pulmonary embolism. Normal heart size. No pericardial effusion. Mediastinum/Nodes: Negative for adenopathy or mass. Mild soft tissue stranding bilaterally at the lower neck, likely related to treatment. Lungs/Pleura: Dependent atelectasis. There is no edema, consolidation, effusion, or pneumothorax. Upper Abdomen: Heterogeneous spleen attributed to contrast timing. Percutaneous  gastrostomy tube which is partially covered and unremarkable. Musculoskeletal: Spondylosis.  No acute or aggressive finding. Review of the MIP images confirms the above findings. IMPRESSION: Negative for pulmonary embolism or other cause for symptoms Electronically Signed   By: Monte Fantasia M.D.   On: 02/01/2020 04:39    Procedures Procedures (including critical care time)  Medications Ordered in ED Medications  fentaNYL (SUBLIMAZE) injection 50 mcg (50 mcg Intravenous Given 02/01/20 0048)  ondansetron (ZOFRAN) injection 4 mg (4 mg Intravenous Given 02/01/20 0048)  ketorolac (TORADOL) 30 MG/ML injection 30 mg (30 mg Intravenous Given 02/01/20 0158)  dicyclomine (BENTYL) injection 20 mg (20 mg Intramuscular Given 02/01/20 0158)  iohexol (OMNIPAQUE) 350 MG/ML injection 100 mL (100 mLs Intravenous Contrast Given 02/01/20 0419)    ED Course  I have reviewed the triage vital signs and the nursing notes.  Pertinent labs & imaging results that were available during my care of the patient were reviewed by me and considered in my medical decision making (see chart for details).    MDM Rules/Calculators/A&P                         Patient was given IV nausea and pain medication.  CT of the abdomen was was done to look for source of his pain such as gallstones, constipation, kidney stone or ureteral stone.  Recheck at 3:30 AM patient states his pain is better.  We discussed his test results which were pretty nonrevealing except he did have a borderline high D-dimer.  He does have a current diagnosis of cancer and is at risk for having a PE so CTA was done to look for PE.  5:00 AM patient states his pain is still improved.  He feels ready to be discharged.  His CTA was negative for pulmonary embolus.  Review of the [place] shows patient got #60 lorazepam 0.5 mg on December 05, 2026 hydrocodone 5/325 on June 25 #30 lorazepam 0.5 mg on June 23 and #420 hydrocodone 7.5/325 on May 6.  Final  Clinical Impression(s) / ED Diagnoses Final diagnoses:  RUQ pain    Rx / DC Orders ED Discharge Orders         Ordered    ondansetron (ZOFRAN) 4 MG tablet  Every 8 hours PRN        02/01/20 0515    dicyclomine (BENTYL) 20 MG tablet  4 times daily  PRN        02/01/20 0515         Plan discharge  Rolland Porter, MD, Barbette Or, MD 02/01/20 [place], [place], MD 02/01/20 [phone]

## 2020-02-01 NOTE — Discharge Instructions (Addendum)
Your laboratory blood test, urinalysis and x-ray studies are unrevealing as to the cause of your pain tonight.  Take the medication as prescribed.  Please follow-up with your primary care doctor if you continue to have pain.

## 2020-02-07 ENCOUNTER — Telehealth: Payer: Self-pay | Admitting: *Deleted

## 2020-02-07 NOTE — Telephone Encounter (Signed)
Faxed orders for Osmolite signed by Dr. Irene Limbo to [facility] @ [phone]. Fax confirmation received.

## 2020-02-08 ENCOUNTER — Inpatient Hospital Stay: Payer: 59 | Admitting: Nutrition

## 2020-02-08 ENCOUNTER — Inpatient Hospital Stay (HOSPITAL_BASED_OUTPATIENT_CLINIC_OR_DEPARTMENT_OTHER): Payer: 59 | Admitting: Hematology

## 2020-02-08 ENCOUNTER — Inpatient Hospital Stay: Payer: 59

## 2020-02-08 ENCOUNTER — Other Ambulatory Visit: Payer: Self-pay

## 2020-02-08 VITALS — BP 107/71 | HR 75 | Temp 98.1°F | Resp 18 | Ht 68.0 in | Wt 257.3 lb

## 2020-02-08 DIAGNOSIS — E785 Hyperlipidemia, unspecified: Secondary | ICD-10-CM | POA: Diagnosis not present

## 2020-02-08 DIAGNOSIS — Z7189 Other specified counseling: Secondary | ICD-10-CM

## 2020-02-08 DIAGNOSIS — R5383 Other fatigue: Secondary | ICD-10-CM | POA: Diagnosis not present

## 2020-02-08 DIAGNOSIS — C090 Malignant neoplasm of tonsillar fossa: Secondary | ICD-10-CM

## 2020-02-08 DIAGNOSIS — Z79899 Other long term (current) drug therapy: Secondary | ICD-10-CM | POA: Diagnosis not present

## 2020-02-08 DIAGNOSIS — Z95828 Presence of other vascular implants and grafts: Secondary | ICD-10-CM

## 2020-02-08 DIAGNOSIS — Z801 Family history of malignant neoplasm of trachea, bronchus and lung: Secondary | ICD-10-CM | POA: Diagnosis not present

## 2020-02-08 DIAGNOSIS — Z800 Family history of malignant neoplasm of digestive organs: Secondary | ICD-10-CM | POA: Diagnosis not present

## 2020-02-08 DIAGNOSIS — C099 Malignant neoplasm of tonsil, unspecified: Secondary | ICD-10-CM

## 2020-02-08 DIAGNOSIS — Z8049 Family history of malignant neoplasm of other genital organs: Secondary | ICD-10-CM | POA: Diagnosis not present

## 2020-02-08 LAB — BASIC METABOLIC PANEL
Anion gap: 8 (ref 5–15)
BUN: 16 mg/dL (ref 6–20)
CO2: 28 mmol/L (ref 22–32)
Calcium: 9.2 mg/dL (ref 8.9–10.3)
Chloride: 102 mmol/L (ref 98–111)
Creatinine, Ser: 1.11 mg/dL (ref 0.61–1.24)
GFR calc Af Amer: >60 mL/min (ref >60)
GFR calc non Af Amer: >60 mL/min (ref >60)
Glucose, Bld: 100 mg/dL — ABNORMAL HIGH (ref 70–99)
Potassium: 4 mmol/L (ref 3.5–5.1)
Sodium: 138 mmol/L (ref 135–145)

## 2020-02-08 LAB — CBC WITH DIFFERENTIAL/PLATELET
Abs Immature Granulocytes: 0.01 10*3/uL (ref 0.00–0.07)
Basophils Absolute: 0 10*3/uL (ref 0.0–0.1)
Basophils Relative: 1 %
Eosinophils Absolute: 0.1 10*3/uL (ref 0.0–0.5)
Eosinophils Relative: 2 %
HCT: 38 % — ABNORMAL LOW (ref 39.0–52.0)
Hemoglobin: 12.6 g/dL — ABNORMAL LOW (ref 13.0–17.0)
Immature Granulocytes: 0 %
Lymphocytes Relative: 15 %
Lymphs Abs: 0.6 10*3/uL — ABNORMAL LOW (ref 0.7–4.0)
MCH: 31.6 pg (ref 26.0–34.0)
MCHC: 33.2 g/dL (ref 30.0–36.0)
MCV: 95.2 fL (ref 80.0–100.0)
Monocytes Absolute: 0.4 10*3/uL (ref 0.1–1.0)
Monocytes Relative: 10 %
Neutro Abs: 2.7 10*3/uL (ref 1.7–7.7)
Neutrophils Relative %: 72 %
Platelets: 188 10*3/uL (ref 150–400)
RBC: 3.99 MIL/uL — ABNORMAL LOW (ref 4.22–5.81)
RDW: 13.1 % (ref 11.5–15.5)
WBC: 3.7 10*3/uL — ABNORMAL LOW (ref 4.0–10.5)
nRBC: 0 % (ref 0.0–0.2)

## 2020-02-08 LAB — MAGNESIUM: Magnesium: 1.9 mg/dL (ref 1.7–2.4)

## 2020-02-08 MED ORDER — HEPARIN SOD (PORK) LOCK FLUSH 100 UNIT/ML IV SOLN
500.0000 [IU] | Freq: Once | INTRAVENOUS | Status: AC
  Administered 2020-02-08: 500 [IU]
  Filled 2020-02-08: qty 5

## 2020-02-08 MED ORDER — SODIUM CHLORIDE 0.9% FLUSH
10.0000 mL | Freq: Once | INTRAVENOUS | Status: AC
  Administered 2020-02-08: 10 mL
  Filled 2020-02-08: qty 10

## 2020-02-08 NOTE — Progress Notes (Signed)
Temporary (3 months) [place] DMV Handicap Placard application completed for patient/MD signed

## 2020-02-08 NOTE — Progress Notes (Signed)
Nutrition follow-up completed with patient status post chemoradiation therapy for cancer of head and neck. Patient's weight decreased and documented as 257.3 pounds on September 30.  This is a 10% weight loss over 3 months which is significant. Patient is using 2-4 cartons of Osmolite 1.5 daily via PEG. He is trying to drink boost plus.  He mixes boost loss with milk to thin it down. He is eating small amounts of soft foods such as oatmeal, canned fruit, ice cream and squash casserole.  He tolerated a few bites of fish. He reports he cannot eat very much at one time or he gets nauseated and wants to vomit. Verbalizes that he does not want to gain any weight. He appears open to trying other soft foods.  Nutrition diagnosis: Food and nutrition related knowledge deficit continues.  Estimated nutrition needs: 2500-2700 cal, 140-150 g protein, 2.7 L fluid.  Intervention: Encouraged patient to continue strategies for increasing calories and protein throughout the day. Recommended increase boost plus/Osmolite 1.5-4 cartons daily. Reviewed additional soft moist foods. Stressed importance of weight maintenance.  Assured him that we are not encouraging weight gain however weight maintenance will provide optimal nutrition for healing.  Monitoring, evaluation, goals: Patient will tolerate increased calories and protein to promote weight stabilization and healing.  Next visit Tuesday, October 26 with Joli.  **Disclaimer: This note was dictated with voice recognition software. Similar sounding words can inadvertently be transcribed and this note may contain transcription errors which may not have been corrected upon publication of note.**

## 2020-02-08 NOTE — Progress Notes (Signed)
HEMATOLOGY/ONCOLOGY CLINIC NOTE  Date of Service: 02/08/2020  Patient Care Team: Dustin Fraise, MD as PCP - General (Family Medicine) Jodi Marble, MD as Consulting Physician (Otolaryngology) Eppie Gibson, MD as Attending Physician (Radiation Oncology) Tish Men, MD (Inactive) as Consulting Physician (Hematology) Leota Sauers, RN (Inactive) as Oncology Nurse Navigator Malmfelt, Stephani Police, RN as Oncology Nurse Navigator (Oncology) Karie Mainland, RD as Dietitian (Nutrition)  REFERRING PHYSICIAN: Claretta Fraise, MD  CHIEF COMPLAINTS/PURPOSE OF CONSULTATION:  Squamous Cell Carcinoma of the Head and Neck  HISTORY OF PRESENTING ILLNESS:   Dustin Leiter Sr. is a wonderful 54 y.o. male who has been referred to [place] by Dustin Fraise, MD for evaluation and management of Squamous Cell Carcinoma of the Head and Neck. Pt is accompanied today by Dustin Rios, wife. The pt reports that he is doing well overall.   The pt reports he is fine. Pt has had both of his COVID19 vaccine. He is healing well from surgery. Some of the nerves were damaged and he has been working it. Part of his salivary gland on the right side was taken out and it burns when he starts to eat. Pt has seen swallowing evaluation today prior to coming. He is back to eating whatever he wants to now. When swallowing he has a burning sensation. Pt has a sleep apnea machine that dries out his throat. Pt also cannot open mouth completely since the dissection.  Pt had a biopsy on March 17th and found it was a squamous cell carcinoma. From his surgery he has various burning, tingling and numbness around his head, neck and shoulder.   Pt has sleep apnea. He also had gout but stopped drinking tea and has not had a flair up since the end of last year. He has no other major medical problems and works with Forensic psychologist. He has had slight hearing loss over the years due to job and being in the [employer]. Pt has never smoked, but his  mother and brohter smoked a lot and pt was exposed to second hand smoke for about 23 years. Many of the pt's family members especially from his mother side have had cancer's that were very aggressive and they passed at early ages.   Of note prior to the patient's visit today, pt has had Surgical Pathology ([id number]) completed on 09/11/19 with results revealing "LYMPH NODE, MODIFIED RADICAL NECK I-IV, REGIONAL DISSECTION: Metastatic carcinoma in 3 of 49 lymph nodes (3/49), with extracapsular extension." Pt has had Surgical Pathology ([id number]) completed on 08/21/19 with results revealing "A. TONSIL, RIGHT: Squamous cell carcinoma, basaloid, p16 positive.  Margins not involved.  Carcinoma focally 1 mm from surgical margin.  See oncology table and comment.  B. TONSIL, LEFT:  Benign tonsil.  No malignancy identified. C. TONGUE, RIGHT BASE, BIOPSY:  Benign tonsillar tissue. No malignancy identified. D. TONGUE, LEFT BASE, BIOPSY: Benign tonsillar tissue.  No malignancy identified. "  Pt has had PET Skull Base to Thigh ([id number]) completed on 08/10/19 with results revealing "1. The 2.8 cm right level IIa lymph node has a maximum SUV of 28.4. A primary lesion is not readily seen. There is some asymmetric palatine tonsillar activity with the left tonsil having maximum SUV of 12.4 and the right 7.8, but no tonsillar mass is observed on the CT data. 2. No compelling findings of active malignancy in the chest, abdomen/pelvis, or skeleton. There is some accentuated anal activity which is likely physiologic given the lack of visible abnormality on the  CT data. 3. Other imaging findings of potential clinical significance: Diffuse hepatic steatosis. Hypodense left renal lesion is probably a cyst but technically too small to characterize." Pt has had [place] Core Biopsy ([id number]) completed on  07/26/19 with results revealing "Successful ultrasound-guided right cervical lymph node/lesion Biopsy." Pt has had of CT  Soft Tissue Neck ([id number]) completed on 07/13/19 with results revealing "4.1 x 3.2 cm necrotic/cystic node or nodal conglomerate at the right level II station likely reflecting nodal metastatic disease. No definite primary mass is identified within the oral cavity, pharynx or larynx. Correlate with direct visualization and consider direct tissue sampling. Additionally, PET-CT may be helpful. Cervical spondylosis as described. A prominent C6-C7 posterior disc osteophyte contributes to at least moderate bony spinal canal stenosis. Multilevel neural foraminal narrowing."  Most recent lab results (09/11/19) of CBC is as follows: all values are WNL except for Glucose at 100, Calcium at 8.7  On review of systems, pt reports various tingling, numbness, burnings sensations around head neck and shoulder, weight loss, insomnia and denies back pain, abdominal pain and any other symptoms.   On PMHx the pt reports tonsillectomy 08/21/19, radical neck dissection 09/11/19, sleep apnea, gout, wisdom tooth extraction, carpal tunnel surgery    On Social Hx the pt reports never smoked, smoked marijuana 30 years ago, works Forensic psychologist, pt was in [employer]   On Family Hx the pt reports brother metastatic lung cancer at age 69, Rexford had liver cancer and pancreatic cancer in early 52's of age, mother has uterine cancer at age 64, grandmother on fathers side has stomach cancer, great grandmother had lung cancer    INTERVAL HISTORY:  Dustin Shappell Sr. is a wonderful 54 y.o. male who is here for evaluation and management of Squamous Cell Carcinoma of the Head and Neck. The patient's last visit with [place] was on 12/28/2019. The pt reports that he is doing well overall.  The pt reports that he is experiencing dry mouth as well as excessive phlegm. He is currently unable to eat dry foods. His oral intake has not been a linear progression, but he has been able to eat oatmeal, applesauce, peaches, pears, and  Boost supplements. Pt is using 3-4 cans of Osmolite per day, down from 6 per day. He has lost 5-6 lbs since our last visit.   Pt went to the ED on 01/31/2020 for right-sided flank pain and vomiting. Pt was thought to have passed a kidney stone.   Lab results today (02/08/20) of CBC w/diff and BMP is as follows: all values are WNL except for WBC at 3.7K, RBC at 3.99, Hgb at 12.6, HCT at 38.0, Lymphs Abs at 0.6K, Glucose at 100 . 02/08/2020 Magnesium at 1.9  On review of systems, pt reports dry mouth, excessive phlegm production, chills, neck numbess/soreness, weight loss and denies worsening fatigue, fevers, night sweats, pain at port site and any other symptoms.   MEDICAL HISTORY:  Past Medical History:  Diagnosis Date  . Arthritis   . Change in stool 02/22/2017  . Family history of cancer   . Family history of uterine cancer   . Gout 02/22/2017  . Hyperlipidemia   . Sleep apnea    Uses CPAP  . Tonsil cancer [place])    metastatic     SURGICAL HISTORY: Past Surgical History:  Procedure Laterality Date  . CARPAL TUNNEL RELEASE Right 04/10/2019  . DIRECT LARYNGOSCOPY N/A 08/21/2019   Procedure: DIRECT LARYNGOSCOPY - WITH BX;  Surgeon: [name], MD;  Location: [place];  Service: ENT;  Laterality: N/A;  . IR GASTROSTOMY TUBE MOD SED  10/13/2019  . IR IMAGING GUIDED PORT INSERTION  10/13/2019  . NECK DISSECTION  09/11/2019  . RADICAL NECK DISSECTION Right 09/11/2019   Procedure: RADICAL NECK DISSECTION;  Surgeon: [name], MD;  Location: [place];  Service: ENT;  Laterality: Right;  . TONSILLECTOMY Bilateral 08/21/2019   Procedure: TONSILLECTOMY;  Surgeon: [name], MD;  Location: [place];  Service: ENT;  Laterality: Bilateral;  . WISDOM TOOTH EXTRACTION       SOCIAL HISTORY: Social History   Socioeconomic History  . Marital status: Married    Spouse name: Not on file  . Number of children: 1  . Years of education: Not on file  . Highest  education level: Not on file  Occupational History  . Occupation: Clinical biochemist  Tobacco Use  . Smoking status: Never Smoker  . Smokeless tobacco: Never Used  Vaping Use  . Vaping Use: Never used  Substance and Sexual Activity  . Alcohol use: No  . Drug use: No  . Sexual activity: Not on file  Other Topics Concern  . Not on file  Social History Narrative   Patient is married and has 1 son.   Patient has never smoked, used tobacco products.   Patient does not drink.  Patient does not use illicit drugs.   Social Determinants of Health   Financial Resource Strain:   . Difficulty of Paying Living Expenses: Not on file  Food Insecurity:   . Worried About Charity fundraiser in the Last Year: Not on file  . Ran Out of Food in the Last Year: Not on file  Transportation Needs:   . Lack of Transportation (Medical): Not on file  . Lack of Transportation (Non-Medical): Not on file  Physical Activity:   . Days of Exercise per Week: Not on file  . Minutes of Exercise per Session: Not on file  Stress:   . Feeling of Stress : Not on file  Social Connections:   . Frequency of Communication with Friends and Family: Not on file  . Frequency of Social Gatherings with Friends and Family: Not on file  . Attends Religious Services: Not on file  . Active Member of Clubs or Organizations: Not on file  . Attends Archivist Meetings: Not on file  . Marital Status: Not on file  Intimate Partner Violence:   . Fear of Current or Ex-Partner: Not on file  . Emotionally Abused: Not on file  . Physically Abused: Not on file  . Sexually Abused: Not on file     FAMILY HISTORY: Family History  Problem Relation Age of Onset  . COPD Mother   . Uterine cancer Mother 16  . Lung cancer Brother 73       smoker  . Lung cancer Maternal Grandfather   . Stomach cancer Paternal Grandmother        d. 94s  . Cervical cancer Maternal Aunt   . Lung cancer Maternal Grandmother        smoker  .  Prostate cancer Paternal Grandfather   . Pancreatic cancer Cousin 70       mother's maternal first cousin  . Stomach cancer Cousin 7       mother's maternal first cousin  . Cancer Cousin        mother's maternal first cousin with NOS cancer  . Cancer Other  MGMs sister with cancer NOS  . Lung cancer Other        smoker  . Cancer Other        mother's maternal grandfather with NOS cancer     ALLERGIES:   has No Known Allergies.   MEDICATIONS:  Current Outpatient Medications  Medication Sig Dispense Refill  . Colchicine (MITIGARE) 0.6 MG CAPS Take 2 capsules at onset of flare. Then take 1 capsule 1 hour later.  May take 1 capsule up to twice daily thereafter. 60 capsule 2  . dicyclomine (BENTYL) 20 MG tablet Take 1 tablet (20 mg total) by mouth 4 (four) times daily as needed for spasms (abdominal or intestinal spasms). 40 tablet 0  . HYDROcodone-acetaminophen (HYCET) 7.5-325 mg/15 ml solution Take 15 mLs by mouth 4 (four) times daily as needed for moderate pain. 420 mL 0  . lidocaine-prilocaine (EMLA) cream     . loratadine (EQ LORATADINE) 10 MG tablet Take 10 mg by mouth daily.     Marland Kitchen LORazepam (ATIVAN) 0.5 MG tablet Take 1-2 tablets (0.5-1 mg total) by mouth every 8 (eight) hours as needed for anxiety or sleep (chemotherapy related nausea). 60 tablet 0  . Nutritional Supplements (FEEDING SUPPLEMENT, OSMOLITE 1.5 CAL,) LIQD Begin Osmolite 1.5 via PEG.  Advance as tolerated to goal rate of 7 cartons daily in 4 feedings with 120 mL of free water before and after each bolus feeding.  In addition mix 30 mL Prosource with 60 mL of water 4 times daily and infuse into PEG with 60 mL free water after each Prosource administration.  Drink an additional 10 ounces of water daily or put into PEG.  If it provides 2885 cal, 144.3 g protein and 2887 mL free water/100% estimated needs. 1659 mL 3  . ondansetron (ZOFRAN) 4 MG tablet Take 1 tablet (4 mg total) by mouth every 8 (eight) hours as needed  for nausea or vomiting. 12 tablet 0  . pantoprazole (PROTONIX) 40 MG tablet Take 1 tablet (40 mg total) by mouth daily before breakfast. 30 tablet 2  . prochlorperazine (COMPAZINE) 10 MG tablet Take 1 tablet (10 mg total) by mouth every 6 (six) hours as needed for nausea or vomiting. 30 tablet 0  . promethazine (PHENERGAN) 25 MG suppository Place 1 suppository (25 mg total) rectally every 6 (six) hours as needed for nausea or vomiting. 12 suppository 1  . senna-docusate (SENNA S) 8.6-50 MG tablet Take 2 tablets by mouth at bedtime as needed for mild constipation or moderate constipation. 60 tablet 1  . sodium fluoride (FLUORISHIELD) 1.1 % GEL dental gel APPLY TO TOOTHBRUSH OR IN FLUORIDE TRAYS AS DIRECTED. REPEAT NIGHTLY. SPIT OUT EXCESS DO NOT SWALLOW. DO NOT RINSE AFTERWARDS    . sodium fluoride (PREVIDENT 5000 PLUS) 1.1 % CREA dental cream Apply to tooth brush or in fluoride trays as directed.  Repeat nightly. Spit out excess-DO NOT swallow. DO NOT rinse afterwards. 2 Tube prn  . tamsulosin (FLOMAX) 0.4 MG CAPS capsule Take 1 capsule (0.4 mg total) by mouth daily. 7 capsule 0   No current facility-administered medications for this visit.   Facility-Administered Medications Ordered in Other Visits  Medication Dose Route Frequency Provider Last Rate Last Admin  . 0.9 %  sodium chloride infusion   Intravenous Continuous Monia Sabal, PA-C         REVIEW OF SYSTEMS:   A 10+ POINT REVIEW OF SYSTEMS WAS OBTAINED including neurology, dermatology, psychiatry, cardiac, respiratory, lymph, extremities, GI, GU, Musculoskeletal, constitutional,  breasts, reproductive, HEENT.  All pertinent positives are noted in the HPI.  All others are negative.   PHYSICAL EXAMINATION: ECOG PERFORMANCE STATUS: 1 - Symptomatic but completely ambulatory  Vitals:   02/08/20 1030  BP: 107/71  Pulse: 75  Resp: 18  Temp: 98.1 F (36.7 C)  SpO2: 100%   Filed Weights   02/08/20 1030  Weight: 257 lb 4.8 oz (116.7  kg)   Body mass index is 39.12 kg/m.   GENERAL:alert, in no acute distress and comfortable SKIN: no acute rashes, no significant lesions EYES: conjunctiva are pink and non-injected, sclera anicteric OROPHARYNX: MMM, no exudates, no oropharyngeal erythema or ulceration NECK: supple, no JVD LYMPH:  no palpable lymphadenopathy in the cervical, axillary or inguinal regions LUNGS: clear to auscultation b/l with normal respiratory effort HEART: regular rate & rhythm ABDOMEN:  normoactive bowel sounds , non tender, not distended. No palpable hepatosplenomegaly.  Extremity: no pedal edema PSYCH: alert & oriented x 3 with fluent speech NEURO: no focal motor/sensory deficits  LABORATORY DATA:  I have reviewed the data as listed  CBC Latest Ref Rng & Units 02/08/2020 01/31/2020 12/28/2019  WBC 4.0 - 10.5 K/uL 3.7(L) 7.7 6.3  Hemoglobin 13.0 - 17.0 g/dL 12.6(L) 12.1(L) 11.5(L)  Hematocrit 39 - 52 % 38.0(L) 37.0(L) 34.0(L)  Platelets 150 - 400 K/uL 188 175 189    CMP Latest Ref Rng & Units 02/08/2020 01/31/2020 12/28/2019  Glucose 70 - 99 mg/dL 100(H) 139(H) 72  BUN 6 - 20 mg/dL 16 19 18   Creatinine 0.61 - 1.24 mg/dL 1.11 1.19 1.09  Sodium 135 - 145 mmol/L 138 135 139  Potassium 3.5 - 5.1 mmol/L 4.0 3.7 4.1  Chloride 98 - 111 mmol/L 102 99 102  CO2 22 - 32 mmol/L 28 25 25   Calcium 8.9 - 10.3 mg/dL 9.2 8.8(L) 9.7  Total Protein 6.5 - 8.1 g/dL - 7.1 -  Total Bilirubin 0.3 - 1.2 mg/dL - 0.9 -  Alkaline Phos 38 - 126 U/L - 58 -  AST 15 - 41 U/L - 25 -  ALT 0 - 44 U/L - 29 -     RADIOGRAPHIC STUDIES: I have personally reviewed the radiological images as listed and agreed with the findings in the report.  PET/CT 08/10/2019: IMPRESSION: 1. The 2.8 cm right level IIa lymph node has a maximum SUV of 28.4. A primary lesion is not readily seen. There is some asymmetric palatine tonsillar activity with the left tonsil having maximum SUV of 12.4 and the right 7.8, but no tonsillar mass is observed  on the CT data. 2. No compelling findings of active malignancy in the chest, abdomen/pelvis, or skeleton. There is some accentuated anal activity which is likely physiologic given the lack of visible abnormality on the CT data. 3. Other imaging findings of potential clinical significance: Diffuse hepatic steatosis. Hypodense left renal lesion is probably a cyst but technically too small to characterize.   Electronically Signed   By: Van Clines M.D.   On: 08/10/2019 15:54   08/21/19 of Surgical Pathology [id number]    SURGICAL PATHOLOGY  * THIS IS AN AMENDED REPORT   CASE: [id number]  PATIENT: [facility]  Surgical Pathology Report   Amendment    Reason for Amendment #1: Other   Clinical History: squamous cell of neck and head (cm)     FINAL MICROSCOPIC DIAGNOSIS:   A. TONSIL, RIGHT:  - Squamous cell carcinoma, basaloid, p16 positive.  - Margins not involved.  - Carcinoma focally 1  mm from surgical margin.  - See oncology table and comment.   B. TONSIL, LEFT:  - Benign tonsil.  - No malignancy identified.   C. TONGUE, RIGHT BASE, BIOPSY:  - Benign tonsillar tissue.  - No malignancy identified.   D. TONGUE, LEFT BASE, BIOPSY:  - Benign tonsillar tissue.  - No malignancy identified.   ONCOLOGY TABLE:  PHARYNX (OROPHARYNX, HYPOPHARYNX, NASOPHARYNX):  Procedure: Right and left tonsillectomy and right and left tongue base  biopsies.  Tumor Site: Right tonsil.  Tumor Laterality: Right tonsil.  Tumor Focality: Unifocal.  Tumor Size: 1 cm x 0.3 cm (3 mm) depth.  Histologic Type: Squamous cell carcinoma, basaloid type, p16 positive,  EBV negative.  Histologic Grade: Poorly differentiated.  Margins: Free of tumor. Carcinoma focally 1 mm from the nearest  surgical margin.    Distance to closest Margin: 1 mm.  Lymphovascular Invasion: Present.  Perineural Invasion: Not identified.  Regional Lymph Nodes: No lymph nodes submitted with this  case. The  previous right neck lymph node needle core biopsy (MCS [id number]) shows  squamous cell carcinoma.  Pathologic Stage Classification (pTNM, AJCC 8th Edition): pT1, pN1, see  comment.    09/11/19 of Surgical Pathology Report [id number])  COMMENT:   Metastatic carcinoma is present in 3 lymph nodes - 1 level 1 lymph node,  1 level 2 lymph node (this metastatic focus also involves skeletal  muscle), and 1 level 3 lymph node (highlighted by a cytokeratin  immunohistochemical stain). Additional studies can be performed (block  A11) upon clinician request.    ASSESSMENT & PLAN:   Dustin Leiter Sr. is a 54 y.o. male with:  1. Rt Tonsilar Basaloid Squamous cell carcinoma , HPV+ve. PT1,pN1,M0. 3/49 LN+ve High risk features - LVI, extranodal extension, high risk morphology. Significant second hand smoke exposure S/p b/l tonsillectomy on 08/21/2019 S/p rt radical neck dissection   PLAN: -Discussed pt labwork today, 02/08/20; blood counts and chemistries look good, Magnesium is WNL -No lab or clinical evidence of Squamous Cell Carcinoma of the Head and Neck recurrence at this time. -Discussed CDC guidelines regarding the COVID19 booster. Recommend pt seek the booster after CT scan. -Recommend pt receive the annual flu vaccine. Wait two weeks between vaccinations if possible.  -Will refer pt to [place] in 1-2 weeks - pt would prefer one in [facility].  -Recommend pt f/u for CT Chest & Soft Tissue Neck as scheduled by Dr. Isidore Moos -Recommend pt f/u with Dr. Constance Holster for limited ROM of right arm which he feels could be related to nerve injury with surgery. -Recommend pt f/u with Speech Therapy as scheduled -Recommend pt f/u with Ernestene Kiel today -Will see back in 3 months    FOLLOW UP: RTC with Dr Irene Limbo with labs in 3 months Referral to cancer rehab center in 1-2 weeks    The total time spent in the appt was 20 minutes and more than 50% was on counseling  and direct patient cares.  All of the patient's questions were answered with apparent satisfaction. The patient knows to call the clinic with any problems, questions or concerns.   Sullivan Lone MD [place] [facility]IV[facility] Hematology/Oncology Physician [facility]  (Office):       [phone] (Work cell):  [phone] (Fax):           [phone]  02/08/2020 11:29 AM  I, Yevette Edwards, am acting as a scribe for Dr. Sullivan Lone.   .I have reviewed the above documentation  for accuracy and completeness, and I agree with the above. Brunetta Genera MD

## 2020-02-13 ENCOUNTER — Other Ambulatory Visit: Payer: Self-pay

## 2020-02-13 ENCOUNTER — Ambulatory Visit: Payer: 59 | Attending: Radiation Oncology

## 2020-02-13 DIAGNOSIS — Z483 Aftercare following surgery for neoplasm: Secondary | ICD-10-CM | POA: Insufficient documentation

## 2020-02-13 DIAGNOSIS — M436 Torticollis: Secondary | ICD-10-CM | POA: Diagnosis present

## 2020-02-13 DIAGNOSIS — M25511 Pain in right shoulder: Secondary | ICD-10-CM | POA: Diagnosis present

## 2020-02-13 DIAGNOSIS — M6281 Muscle weakness (generalized): Secondary | ICD-10-CM | POA: Insufficient documentation

## 2020-02-13 DIAGNOSIS — R1310 Dysphagia, unspecified: Secondary | ICD-10-CM | POA: Diagnosis not present

## 2020-02-13 DIAGNOSIS — R293 Abnormal posture: Secondary | ICD-10-CM | POA: Insufficient documentation

## 2020-02-13 NOTE — Therapy (Signed)
[facility] [address] [place], [place], [address] Phone: [phone]   Fax:  [phone]  Speech Language Pathology Treatment  Patient Details  Name: Dustin Hammitt Sr. MRN: [id number] Date of Birth: 1965-11-25 Referring Provider (SLP): Eppie Gibson, MD   Encounter Date: 02/13/2020   End of Session - 02/13/20 1420    Visit Number 5    Number of Visits 7    Date for SLP Re-Evaluation 03/29/20    SLP Start Time 1320    SLP Stop Time  3235    SLP Time Calculation (min) 35 min    Activity Tolerance Patient tolerated treatment well           Past Medical History:  Diagnosis Date  . Arthritis   . Change in stool 02/22/2017  . Family history of cancer   . Family history of uterine cancer   . Gout 02/22/2017  . Hyperlipidemia   . Sleep apnea    Uses CPAP  . Tonsil cancer [facility])    metastatic    Past Surgical History:  Procedure Laterality Date  . CARPAL TUNNEL RELEASE Right 04/10/2019  . DIRECT LARYNGOSCOPY N/A 08/21/2019   Procedure: DIRECT LARYNGOSCOPY - WITH BX;  Surgeon: [name], MD;  Location: [facility];  Service: ENT;  Laterality: N/A;  . IR GASTROSTOMY TUBE MOD SED  10/13/2019  . IR IMAGING GUIDED PORT INSERTION  10/13/2019  . NECK DISSECTION  09/11/2019  . RADICAL NECK DISSECTION Right 09/11/2019   Procedure: RADICAL NECK DISSECTION;  Surgeon: [name], MD;  Location: [facility];  Service: ENT;  Laterality: Right;  . TONSILLECTOMY Bilateral 08/21/2019   Procedure: TONSILLECTOMY;  Surgeon: [name], MD;  Location: [facility];  Service: ENT;  Laterality: Bilateral;  . WISDOM TOOTH EXTRACTION      There were no vitals filed for this visit.   Subjective Assessment - 02/13/20 1323    Subjective Taco meat, refried beans (spoonfulls), Cheerios, tomato soup recently. Having a lot of Boost. Weight is stable.    Currently in Pain? No/denies                 ADULT  SLP TREATMENT - 02/13/20 1325      General Information   Behavior/Cognition Alert;Cooperative;Pleasant mood      Treatment Provided   Treatment provided Dysphagia      Dysphagia Treatment   Temperature Spikes Noted No    Respiratory Status Room air    Treatment Methods Skilled observation;Therapeutic exercise    Type of PO's observed Thin liquids    Liquids provided via Cup    Oral Phase Signs & Symptoms --   none noted today   Pharyngeal Phase Signs & Symptoms --   none noted today   Other treatment/comments "I'm just never hungry," pt states. [name] has not chosen to write down food items but has fairly good recollection of items in the last 4 days. Pt does HEP every other day, once/day. Pt states he doesn't do any pitch raise, super supraglottic, and chin tuck against resistance. Pt told SLP rationale for HEP without cues needed; SLP encouraged pt to incr his frequency and meet the scope of the HEP. HEP was completed independently. Pt asked what would be s/sx defficulty with swallowingn and SLP shared incr frequency throat clearing and coughing, as well as difficulty with pharyngeal clearance.       Assessment / Recommendations / Plan   Plan --   cont  every other month - pt agrees           SLP Education - 02/13/20 1419    Education Details need to do scope and frequency recommended by SLP    Person(s) Educated Patient    Methods Explanation    Comprehension Verbalized understanding            SLP Short Term Goals - 12/14/19 1556      SLP SHORT TERM GOAL #1   Title Pt will demo knowledge of rationale for HEP completion    Period --   sessions, for all STGs   Status Achieved      SLP SHORT TERM GOAL #2   Title Pt will complete HEP with rare min A over two sessions    Baseline 11-09-19, 12-14-19    Status Achieved      SLP SHORT TERM GOAL #3   Title Pt will tell SLP how a food journal can hasten/facilitate return to more normalized diet in 2 sessions    Baseline 12-14-19     Time 2    Status Revised      SLP SHORT TERM GOAL #4   Title Pt will tell SLP 3 overt s/s aspiration PNA with modified independence    Status Achieved            SLP Long Term Goals - 02/13/20 1421      SLP LONG TERM GOAL #1   Title Pt will complete HEP with modified independence over 3 visits    Baseline 11-09-19, 01-11-20    Period --   sessions, for all LTGs   Status Achieved      SLP LONG TERM GOAL #2   Title Pt will tell SLP when to decr frequency of HEP    Time 1    Status On-going            Plan - 02/13/20 1420    Clinical Impression Statement Pt presents today with WNL/WFL swallowing ability with applesauce and water. No overt s/sx aspiration PNA reported or observed today. See "other comments" for more detail. Data suggests that as pts progress through rad or chemorad therapy that their swallowing ability will decrease. Also, WNL swallowing is threatened by muscle fibrosis that will likely develop after rad/chemorad is completed. Skilled ST would cont to be beneficial to the pt in order to regularly assess pt's safety with POs and/or need for instrumental swallow assessment, as well as to assess accurate completion of swallowing HEP.    Speech Therapy Frequency --   once approx every 4 weeks   Duration --   7 total sessions   Treatment/Interventions Aspiration precaution training;Pharyngeal strengthening exercises;Diet toleration management by SLP;Trials of upgraded texture/liquids;Patient/family education;SLP instruction and feedback    Potential to [place] provided today    Consulted and Agree with Plan of Care Patient           Patient will benefit from skilled therapeutic intervention in order to improve the following deficits and impairments:   Dysphagia, unspecified type    Problem List Patient Active Problem List   Diagnosis Date Noted  . Family history of uterine cancer   . Family history of cancer   .  Port-A-Cath in place 12/28/2019  . Hematuria 12/18/2019  . Counseling regarding advance care planning and goals of care 10/12/2019  . Malignant neoplasm of tonsillar fossa ([place]) 09/11/2019  . S/P tonsillectomy 08/21/2019  . Squamous cell  carcinoma of head and neck ([place]) 08/10/2019  . Morbid obesity ([place]) 06/08/2019  . Moderate mixed hyperlipidemia not requiring statin therapy 06/08/2019  . OSA (obstructive sleep apnea) 06/08/2019  . Change in stool 02/22/2017  . Gout 02/22/2017    Diana Davenport ,MS, CCC-SLP  02/13/2020, 2:22 PM  [facility] [address] [place], [place], [address] Phone: [phone]   Fax:  [phone]   Name: Dustin Cornelio Sr. MRN: [id number] Date of Birth: 16-Aug-1965

## 2020-02-26 ENCOUNTER — Ambulatory Visit: Payer: 59

## 2020-02-26 ENCOUNTER — Ambulatory Visit (HOSPITAL_COMMUNITY): Payer: Dental | Admitting: Dentistry

## 2020-02-26 ENCOUNTER — Other Ambulatory Visit: Payer: Self-pay

## 2020-02-26 DIAGNOSIS — R432 Parageusia: Secondary | ICD-10-CM | POA: Diagnosis not present

## 2020-02-26 DIAGNOSIS — M25511 Pain in right shoulder: Secondary | ICD-10-CM

## 2020-02-26 DIAGNOSIS — R1310 Dysphagia, unspecified: Secondary | ICD-10-CM

## 2020-02-26 DIAGNOSIS — M436 Torticollis: Secondary | ICD-10-CM

## 2020-02-26 DIAGNOSIS — M6281 Muscle weakness (generalized): Secondary | ICD-10-CM

## 2020-02-26 DIAGNOSIS — R293 Abnormal posture: Secondary | ICD-10-CM

## 2020-02-26 DIAGNOSIS — R252 Cramp and spasm: Secondary | ICD-10-CM

## 2020-02-26 DIAGNOSIS — R634 Abnormal weight loss: Secondary | ICD-10-CM

## 2020-02-26 DIAGNOSIS — Z483 Aftercare following surgery for neoplasm: Secondary | ICD-10-CM

## 2020-02-26 DIAGNOSIS — K030 Excessive attrition of teeth: Secondary | ICD-10-CM

## 2020-02-26 NOTE — Progress Notes (Signed)
DENTAL VISIT: FOLLOW-UP   Patient Name:   Dustin Ricciuti Sr. Date of Birth:   01-14-1966 Medical Record Number: [id number]  COVID 19 SCREENING: The patient does not symptoms concerning for COVID-19 infection (Including fever, chills, cough, or new SHORTNESS OF BREATH).      Dustin Leiter Sr. presents for oral examination during/after radiation therapy. Patient has completed 30 of 30 radiation treatments from 10/19/19 thru 11/30/19.  Vitals: BP 131/73 (BP Location: Right Arm)   Pulse 67   Temp 98.5 F (36.9 C)   Wt 253 lb (114.8 kg)   BMI 38.47 kg/m     Reviewed medical history with patient.  No reported changes.  REVIEW OF CHIEF COMPLAINTS: DRY MOUTH: Yes HARD TO SWALLOW: No HURT TO SWALLOW: No TASTE CHANGES: Taste is slowly returning SORES IN MOUTH: No TRISMUS: Hard to open wide WEIGHT: Down from 310lbs to 253lbs  HOME OH REGIMEN:  BRUSHING: 2-3 times daily FLOSSING: Occasionally, when he thinks about it. RINSING: Uses Listerine mouthrinse sometimes FLUORIDE: Stopped using fluoride trays about 28mo after radiation TRISMUS EXERCISES: Maximum interincisal opening: 40mm.  Down from 60mm.  Patient Active Problem List   Diagnosis Date Noted  . Family history of uterine cancer   . Family history of cancer   . Port-A-Cath in place 12/28/2019  . Hematuria 12/18/2019  . Counseling regarding advance care planning and goals of care 10/12/2019  . Malignant neoplasm of tonsillar fossa ([place]) 09/11/2019  . S/P tonsillectomy 08/21/2019  . Squamous cell carcinoma of head and neck ([place]) 08/10/2019  . Morbid obesity ([place]) 06/08/2019  . Moderate mixed hyperlipidemia not requiring statin therapy 06/08/2019  . OSA (obstructive sleep apnea) 06/08/2019  . Change in stool 02/22/2017  . Gout 02/22/2017   Past Medical History:  Diagnosis Date  . Arthritis   . Change in stool 02/22/2017  . Family history of cancer   . Family history of uterine cancer   . Gout 02/22/2017  .  Hyperlipidemia   . Sleep apnea    Uses CPAP  . Tonsil cancer [facility])    metastatic   Current Outpatient Medications  Medication Sig Dispense Refill  . Colchicine (MITIGARE) 0.6 MG CAPS Take 2 capsules at onset of flare. Then take 1 capsule 1 hour later.  May take 1 capsule up to twice daily thereafter. 60 capsule 2  . dicyclomine (BENTYL) 20 MG tablet Take 1 tablet (20 mg total) by mouth 4 (four) times daily as needed for spasms (abdominal or intestinal spasms). (Patient not taking: Reported on 02/26/2020) 40 tablet 0  . HYDROcodone-acetaminophen (HYCET) 7.5-325 mg/15 ml solution Take 15 mLs by mouth 4 (four) times daily as needed for moderate pain. 420 mL 0  . lidocaine-prilocaine (EMLA) cream     . loratadine (EQ LORATADINE) 10 MG tablet Take 10 mg by mouth daily.     Marland Kitchen LORazepam (ATIVAN) 0.5 MG tablet Take 1-2 tablets (0.5-1 mg total) by mouth every 8 (eight) hours as needed for anxiety or sleep (chemotherapy related nausea). 60 tablet 0  . Nutritional Supplements (FEEDING SUPPLEMENT, OSMOLITE 1.5 CAL,) LIQD Begin Osmolite 1.5 via PEG.  Advance as tolerated to goal rate of 7 cartons daily in 4 feedings with 120 mL of free water before and after each bolus feeding.  In addition mix 30 mL Prosource with 60 mL of water 4 times daily and infuse into PEG with 60 mL free water after each Prosource administration.  Drink an additional 10 ounces of water daily or put into PEG.  If it provides 2885 cal, 144.3 g protein and 2887 mL free water/100% estimated needs. 1659 mL 3  . ondansetron (ZOFRAN) 4 MG tablet Take 1 tablet (4 mg total) by mouth every 8 (eight) hours as needed for nausea or vomiting. 12 tablet 0  . pantoprazole (PROTONIX) 40 MG tablet Take 1 tablet (40 mg total) by mouth daily before breakfast. 30 tablet 2  . prochlorperazine (COMPAZINE) 10 MG tablet Take 1 tablet (10 mg total) by mouth every 6 (six) hours as needed for nausea or vomiting. 30 tablet 0  . promethazine (PHENERGAN) 25 MG  suppository Place 1 suppository (25 mg total) rectally every 6 (six) hours as needed for nausea or vomiting. (Patient not taking: Reported on 02/26/2020) 12 suppository 1  . senna-docusate (SENNA S) 8.6-50 MG tablet Take 2 tablets by mouth at bedtime as needed for mild constipation or moderate constipation. (Patient not taking: Reported on 02/26/2020) 60 tablet 1  . sodium fluoride (FLUORISHIELD) 1.1 % GEL dental gel APPLY TO TOOTHBRUSH OR IN FLUORIDE TRAYS AS DIRECTED. REPEAT NIGHTLY. SPIT OUT EXCESS DO NOT SWALLOW. DO NOT RINSE AFTERWARDS    . sodium fluoride (PREVIDENT 5000 PLUS) 1.1 % CREA dental cream Apply to tooth brush or in fluoride trays as directed.  Repeat nightly. Spit out excess-DO NOT swallow. DO NOT rinse afterwards. 2 Tube prn  . tamsulosin (FLOMAX) 0.4 MG CAPS capsule Take 1 capsule (0.4 mg total) by mouth daily. 7 capsule 0   No current facility-administered medications for this visit.   Facility-Administered Medications Ordered in Other Visits  Medication Dose Route Frequency Provider Last Rate Last Admin  . 0.9 %  sodium chloride infusion   Intravenous Continuous Monia Sabal, PA-C        No Known Allergies    DENTAL EXAM:  Oral Hygiene:(PLAQUE): Slight generalized accumulation LOCATION OF MUCOSITIS: No areas of mucositis evident and patient denies any symptoms. DESCRIPTION OF SALIVA: Thick, viscous saliva ANY EXPOSED BONE: None evident OTHER WATCHED AREAS: Incisal caries previously charted are unchanged.  No new areas of decay noted clinically.  DIAGNOSES: 1. Xerostomia: He states this is his worst side effect of XRT.  He reports drinking water and swishing with water multiple times throughout the day with little relief. 2. Dysgeusia: He says that his taste is returning.  Some foods do not taste the same anymore or taste bitter, so he avoids them. 3. Dysphagia: He is doing his swallowing exercises regularly which is helping. 4. Odynophagia: Patient reports some  pain when yawning and swallowing in his throat on the right side where the cancer was.  He reports it has been happening since his surgery and subsequent XRT and chemotherapy. 5. Weight Loss: He still has his feeding tube. 6. Accretions: Dentition appears consistent with previous exam and charting. 7. Trismus: He started seeing physical therapy.  His first visit was last week, and he intends on going 3x a week.  RECOMMENDATIONS: 1. Brush after meals and at bedtime, and floss daily.  Use fluoride at bedtime. 2. Use trismus exercises as directed. 3. Use CLOSYs or salt water/baking soda rinses.  Coupon given to patient today. 4. Multiple sips of water as needed.  Stay hydrated. 5. Return to regular dentist for routine dental care.  Patient states having an appointment scheduled with his dentist this Wednesday for a cleaning and exam. 6. Call us for any questions or concerns that may arise.     All questions/concerns were addressed, and patient verbalized understanding of recommendations and  discussion.  He tolerated today's visit well and departed in stable condition.   [place], DMD

## 2020-02-26 NOTE — Patient Instructions (Signed)
Pt given illustrated and written instructions for HEP including cervical Rotation, bilateral SB with 5 sec hold, gentle UT stretching, cervical retractions, scapular retractions, and cervical backbending.

## 2020-02-26 NOTE — Therapy (Signed)
[place], [place], [address] Phone: [phone]   Fax:  [phone]  Physical Therapy Evaluation  Patient Details  Name: Dustin Garrels Sr. MRN: [id number] Date of Birth: 06-06-1965 Referring Provider (PT): Dr. Irene Limbo   Encounter Date: 02/26/2020   PT End of Session - 02/26/20 0908    Visit Number 1    Number of Visits 18    Date for PT Re-Evaluation 04/08/20    Progress Note Due on Visit 18    PT Start Time 0803    PT Stop Time 0851    PT Time Calculation (min) 48 min    Activity Tolerance Patient tolerated treatment well    Behavior During Therapy [facility] for tasks assessed/performed           Past Medical History:  Diagnosis Date  . Arthritis   . Change in stool 02/22/2017  . Family history of cancer   . Family history of uterine cancer   . Gout 02/22/2017  . Hyperlipidemia   . Sleep apnea    Uses CPAP  . Tonsil cancer [facility])    metastatic    Past Surgical History:  Procedure Laterality Date  . CARPAL TUNNEL RELEASE Right 04/10/2019  . DIRECT LARYNGOSCOPY N/A 08/21/2019   Procedure: DIRECT LARYNGOSCOPY - WITH BX;  Surgeon: [name], MD;  Location: [facility];  Service: ENT;  Laterality: N/A;  . IR GASTROSTOMY TUBE MOD SED  10/13/2019  . IR IMAGING GUIDED PORT INSERTION  10/13/2019  . NECK DISSECTION  09/11/2019  . RADICAL NECK DISSECTION Right 09/11/2019   Procedure: RADICAL NECK DISSECTION;  Surgeon: [name], MD;  Location: [facility];  Service: ENT;  Laterality: Right;  . TONSILLECTOMY Bilateral 08/21/2019   Procedure: TONSILLECTOMY;  Surgeon: [name], MD;  Location: [facility];  Service: ENT;  Laterality: Bilateral;  . WISDOM TOOTH EXTRACTION      There were no vitals filed for this visit.    Subjective Assessment - 02/26/20 0808    Subjective Pt is having trouble with Right shoulder ROM and neck ROM in both directions.  Also having some trouble  with right lip when trying to rinse his mouth and swish the water squirts out because lower lip doesn't stay up.  It has gotten better.  Also may have mild swelling in neck per his wife.  Right shoulder and neck will ache at  night or with driving.  Has numbness/tingling right side of neck that is fairly constant.    Pertinent History Rt tonsil cancer post tonsillectomy and adnoidectomy 08/21/19 with neck dissection on 09/11/19.  Radiation and chemotherapy completed July 22,2021  Still has a feeding tube because he has no appetite.    Patient Stated Goals Improve neck motion so he can see to back up when driving,, improve Right shoulder ROM for reaching, be able to open mouth wider for eating.    Currently in Pain? Yes    Pain Score 1     Pain Location Shoulder    Pain Orientation Right    Pain Descriptors / Indicators Aching   fatigue             [facility] PT Assessment - 02/26/20 0001      Assessment   Referring Provider (PT) Dr. Irene Limbo    Onset Date/Surgical Date 08/23/19    Hand Dominance Right;Left    Prior Therapy swallowing      Precautions   Precaution Comments lymphedema  Restrictions   Weight Bearing Restrictions No      Balance Screen   Has the patient fallen in the past 6 months No    Has the patient had a decrease in activity level because of a fear of falling?  Yes      [place] residence    Living Arrangements Spouse/significant other    Available Help at Discharge Family    Additional Comments live in [place], would like to have therapy in [place]     Prior Function   Level of [place] On disability   industrial control work     Cognition   Overall Cognitive Status Within Functional Limits for tasks assessed      Observation/Other Assessments   Observations incision healed    Skin Integrity skin darkening from radiation      Posture/Postural Control   Posture/Postural Control Postural  limitations    Postural Limitations Rounded Shoulders;Forward head;Increased thoracic kyphosis      AROM   Right Shoulder Extension 32 Degrees    Right Shoulder Flexion 100 Degrees    Right Shoulder ABduction 75 Degrees    Right Shoulder Internal Rotation 7 Degrees    Right Shoulder External Rotation 75 Degrees    Left Shoulder Extension 54 Degrees    Left Shoulder Flexion 150 Degrees    Left Shoulder ABduction 150 Degrees    Left Shoulder Internal Rotation 65 Degrees    Left Shoulder External Rotation 90 Degrees    Cervical Flexion 40    Cervical Extension 35    Cervical - Right Side Bend 38    Cervical - Left Side Bend 36    Cervical - Right Rotation 39    Cervical - Left Rotation 40             LYMPHEDEMA/ONCOLOGY QUESTIONNAIRE - 02/26/20 0001      Type   Cancer Type Modified radical neck dissection      Surgeries   Other Surgery Date 09/11/19      Treatment   Active Chemotherapy Treatment No    Past Chemotherapy Treatment Yes    Active Radiation Treatment No    Past Radiation Treatment Yes      What other symptoms do you have   Are you having pitting edema No    Is there Decreased scar mobility Yes      Lymphedema Assessments   Lymphedema Assessments Head and Neck      Head and Neck   Other mild behind ear and lateral neck    Other difficulty openining mouth   20 mm with head erect, 15 mm with head down                  Objective measurements completed on examination: See above findings.               PT Education - 02/26/20 0907    Education Details Pt instructed in post op Cervical ROM stretches and advised to do in comfortable ROM.  Bilateral rotation, SB, gentle UT stretches, cervical BB, cervical and scapular retractions.  Given illustrated and written instructions for HEP               PT Long Term Goals - 02/26/20 0929      PT LONG TERM GOAL #1   Title Pt will have active shoulder flexion and scaption atleast 130  degrees for improved reaching ability  Time 6    Period Weeks    Status New    Target Date 04/08/20      PT LONG TERM GOAL #2   Title Pt will note improved ability to look over shoulder to back up while driving without turning his entire body    Time 6    Period Weeks    Status New    Target Date 04/08/20      PT LONG TERM GOAL #3   Title pt will demonstrate ability to open mouth to 25 mm or greater for improved ability to eat    Time 6    Period Weeks    Status New    Target Date 04/08/20      PT LONG TERM GOAL #4   Title Right shoulder strength atleast 4-4+/5 for improved use of right UE    Time 6    Period Weeks    Status New    Target Date 04/08/20              Head and Neck Clinic Goals - 02/26/20 5188      Patient will be able to verbalize understanding of a home exercise program for cervical range of motion, posture, and walking.    Time 2    Period Weeks    Status New    Target Date 03/11/20      Patient will be able to verbalize understanding of proper sitting and standing posture.    Time 2    Period Weeks    Status New    Target Date 03/11/20      Patient will be able to verbalize understanding of lymphedema risk and availability of treatment for this condition.    Time 3    Period Weeks    Status New    Target Date 03/18/20              Plan - 02/26/20 0912    Clinical Impression Statement Pt. is s/p modified radical cervical dissection on 09/11/19 and underwent chemotherapy and radiation.  He presents today with main complaint of cervical limitations and right shoulder limitations and achiness, as well as very mild swelling at right lateral neck, and difficulty opening his mouth to eat.  He has difficulty turning to drive especially with backing up, and difficulty performing AROM activities with right shoulder for reaching.  He will benefit from skilled PT to address ROM limitations in neck, shoulder and mouth opening, as well  as possible MLD for swelling prn.    Personal Factors and Comorbidities Comorbidity 2    Comorbidities Modified radical neck dissection with spinal accessory nerve not preserved, post chemo and radiation    Examination-Activity Limitations Reach Overhead;Self Feeding;Dressing    Examination-Participation Restrictions Occupation;Driving;Other   activites requiring reaching above shoulder height   Stability/Clinical Decision Making Stable/Uncomplicated    Clinical Decision Making Low    Rehab Potential Excellent    PT Frequency 3x / week    PT Duration 6 weeks    PT Treatment/Interventions ADLs/Self Care Home Management;Neuromuscular re-education;Passive range of motion;Manual techniques;Manual lymph drainage;Patient/family education;Therapeutic exercise;Therapeutic activities    PT Next Visit Plan ROM activities for right shoulder, Review cervical ROM HEP and progress prn., mouth opening exs,postural education    PT Home Exercise Plan Post op cervical exs; Cervical rot and SB with 5 sec hold, gentle UT stretches, cervical and scapular retraction    Consulted and Agree with Plan of Care Patient;Family member/caregiver  Patient will benefit from skilled therapeutic intervention in order to improve the following deficits and impairments:  Increased fascial restricitons, Decreased mobility, Decreased scar mobility, Postural dysfunction, Decreased activity tolerance, Decreased range of motion, Decreased strength, Impaired UE functional use, Decreased knowledge of precautions, Increased edema  Visit Diagnosis: Abnormal posture  Neck stiffness  Muscle weakness (generalized)  Aftercare following surgery for neoplasm  Acute pain of right shoulder     Problem List Patient Active Problem List   Diagnosis Date Noted  . Family history of uterine cancer   . Family history of cancer   . Port-A-Cath in place 12/28/2019  . Hematuria 12/18/2019  . Counseling regarding advance care  planning and goals of care 10/12/2019  . Malignant neoplasm of tonsillar fossa ([place]) 09/11/2019  . S/P tonsillectomy 08/21/2019  . Squamous cell carcinoma of head and neck ([place]) 08/10/2019  . Morbid obesity ([place]) 06/08/2019  . Moderate mixed hyperlipidemia not requiring statin therapy 06/08/2019  . OSA (obstructive sleep apnea) 06/08/2019  . Change in stool 02/22/2017  . Gout 02/22/2017    Claris Pong, PT 02/26/2020, 9:39 AM  [facility] [place], [place], [address] Phone: [phone]   Fax:  [phone]  Name: Dustin Blackerby Sr. MRN: [id number] Date of Birth: September 05, 1965

## 2020-02-27 ENCOUNTER — Encounter (HOSPITAL_COMMUNITY): Payer: Self-pay | Admitting: Dentistry

## 2020-02-28 ENCOUNTER — Encounter (HOSPITAL_COMMUNITY): Payer: Self-pay | Admitting: Physical Therapy

## 2020-02-28 ENCOUNTER — Other Ambulatory Visit: Payer: Self-pay

## 2020-02-28 ENCOUNTER — Ambulatory Visit (HOSPITAL_COMMUNITY): Payer: 59 | Attending: Hematology | Admitting: Physical Therapy

## 2020-02-28 ENCOUNTER — Telehealth: Payer: Self-pay | Admitting: Hematology

## 2020-02-28 DIAGNOSIS — M25511 Pain in right shoulder: Secondary | ICD-10-CM | POA: Diagnosis present

## 2020-02-28 DIAGNOSIS — R293 Abnormal posture: Secondary | ICD-10-CM | POA: Insufficient documentation

## 2020-02-28 DIAGNOSIS — Z483 Aftercare following surgery for neoplasm: Secondary | ICD-10-CM | POA: Diagnosis present

## 2020-02-28 DIAGNOSIS — R1310 Dysphagia, unspecified: Secondary | ICD-10-CM | POA: Insufficient documentation

## 2020-02-28 DIAGNOSIS — M436 Torticollis: Secondary | ICD-10-CM | POA: Insufficient documentation

## 2020-02-28 DIAGNOSIS — M6281 Muscle weakness (generalized): Secondary | ICD-10-CM | POA: Diagnosis present

## 2020-02-28 NOTE — Telephone Encounter (Signed)
Rescheduled 10/16 appointment to 11/10 per provider pal, patient has been called and notified.

## 2020-02-28 NOTE — Therapy (Signed)
[place], [place], [address] Phone: [phone]   Fax:  [phone]  Physical Therapy Treatment  Patient Details  Name: Dustin Stein Sr. MRN: [id number] Date of Birth: 11/13/1965 Referring Provider (PT): Dr. Irene Limbo   Encounter Date: 02/28/2020   PT End of Session - 02/28/20 0908    Visit Number 2    Number of Visits 18    Date for PT Re-Evaluation 04/08/20    Progress Note Due on Visit 18    PT Start Time 0840    PT Stop Time 0920    PT Time Calculation (min) 40 min    Activity Tolerance Patient tolerated treatment well    Behavior During Therapy [facility] for tasks assessed/performed           Past Medical History:  Diagnosis Date  . Arthritis   . Change in stool 02/22/2017  . Family history of cancer   . Family history of uterine cancer   . Gout 02/22/2017  . Hyperlipidemia   . Sleep apnea    Uses CPAP  . Tonsil cancer [facility])    metastatic    Past Surgical History:  Procedure Laterality Date  . CARPAL TUNNEL RELEASE Right 04/10/2019  . DIRECT LARYNGOSCOPY N/A 08/21/2019   Procedure: DIRECT LARYNGOSCOPY - WITH BX;  Surgeon: [name], MD;  Location: [facility];  Service: ENT;  Laterality: N/A;  . IR GASTROSTOMY TUBE MOD SED  10/13/2019  . IR IMAGING GUIDED PORT INSERTION  10/13/2019  . NECK DISSECTION  09/11/2019  . RADICAL NECK DISSECTION Right 09/11/2019   Procedure: RADICAL NECK DISSECTION;  Surgeon: [name], MD;  Location: [facility];  Service: ENT;  Laterality: Right;  . TONSILLECTOMY Bilateral 08/21/2019   Procedure: TONSILLECTOMY;  Surgeon: [name], MD;  Location: [facility];  Service: ENT;  Laterality: Bilateral;  . WISDOM TOOTH EXTRACTION      There were no vitals filed for this visit.   Subjective Assessment - 02/28/20 0843    Subjective Pt states that it has been a busy day and he has not done the exercises but he stretches his neck quite a bit through due  to it feeling so tight.  PT main  concern is his Rt shoulder which has nerve damage due to the operation therefore there is pain an decreased use of his shoulder    Pertinent History Rt tonsil cancer post tonsillectomy and adnoidectomy 08/21/19 with scheduled neck dissection on 09/11/19.  Radiation and chemotherapy completed July 22,2021  Still has a feeding tube because he has no appetite.    Patient Stated Goals Improve neck motion so he can see to back up when driving,, improve Right shoulder ROM for reaching    Currently in Pain? No/denies                             [facility] Adult PT Treatment/Exercise - 02/28/20 0001      Exercises   Exercises Neck;Shoulder      Neck Exercises: Supine   Neck Retraction 10 reps    Cervical Rotation Both;10 reps    Lateral Flexion Both;5 reps      Shoulder Exercises: Supine   Protraction Strengthening;Both;10 reps   emphasis on retraction    External Rotation Strengthening;Right;10 reps;Weights    External Rotation Weight (lbs) 2    Internal Rotation Strengthening;Right;10 reps    Internal Rotation Weight (lbs) 2  Flexion AAROM;Strengthening;10 reps   with wand    ABduction AAROM;Right;10 reps   wand                       PT Long Term Goals - 02/28/20 0919      PT LONG TERM GOAL #1   Title Pt will have active shoulder flexion and scaption atleast 130 degrees for improved reaching ability    Time 6    Period Weeks    Status On-going      PT LONG TERM GOAL #2   Title Pt will note improved ability to look over shoulder to back up while driving without turning his entire body    Time 6    Period Weeks    Status On-going      PT LONG TERM GOAL #3   Title pt will demonstrate ability to open mouth to 25 mm or greater for improved ability to eat    Time 6    Period Weeks    Status On-going      PT LONG TERM GOAL #4   Title Right shoulder strength atleast 4-4+/5 for improved use of right UE    Time 6    Period  Weeks    Status On-going                 Plan - 02/28/20 0909    Clinical Impression Statement PT treatment began with supine shoulder ROM exercises to promote motion with HEP given, pt continues to have significant ROM in cervical area.  Noted induration at submental area and cervical swelling, head and neck lymphedema wil be addressed later in this episode as pt main concern at this time is his ROM    Personal Factors and Comorbidities Comorbidity 2    Comorbidities Modified radical neck dissection with spinal accessory nerve not preserved, post chemo and radiation    Examination-Activity Limitations Reach Overhead;Self Feeding;Dressing    Examination-Participation Restrictions Occupation;Driving;Other   activites requiring reaching above shoulder height   Stability/Clinical Decision Making Stable/Uncomplicated    Rehab Potential Excellent    PT Frequency 3x / week    PT Duration 6 weeks    PT Treatment/Interventions ADLs/Self Care Home Management;Neuromuscular re-education;Passive range of motion;Manual techniques;Manual lymph drainage;Patient/family education;Therapeutic exercise;Therapeutic activities    PT Next Visit Plan progress prn., mouth opening exs,postural education    PT Home Exercise Plan Post op cervical exs; Cervical rot and SB with 5 sec hold, gentle UT stretches, cervical and scapular retraction:  10/20:  Shoulder wand exercises to improve ROM,    Consulted and Agree with Plan of Care Patient;Family member/caregiver           Patient will benefit from skilled therapeutic intervention in order to improve the following deficits and impairments:  Increased fascial restricitons, Decreased mobility, Decreased scar mobility, Postural dysfunction, Decreased activity tolerance, Decreased range of motion, Decreased strength, Impaired UE functional use, Decreased knowledge of precautions, Increased edema  Visit Diagnosis: Abnormal posture  Neck stiffness  Muscle  weakness (generalized)  Acute pain of right shoulder  Aftercare following surgery for neoplasm     Problem List Patient Active Problem List   Diagnosis Date Noted  . Family history of uterine cancer   . Family history of cancer   . Port-A-Cath in place 12/28/2019  . Hematuria 12/18/2019  . Counseling regarding advance care planning and goals of care 10/12/2019  . Malignant neoplasm of tonsillar fossa ([place]) 09/11/2019  . S/P tonsillectomy  08/21/2019  . Squamous cell carcinoma of head and neck ([place]) 08/10/2019  . Morbid obesity ([place]) 06/08/2019  . Moderate mixed hyperlipidemia not requiring statin therapy 06/08/2019  . OSA (obstructive sleep apnea) 06/08/2019  . Change in stool 02/22/2017  . Gout 02/22/2017    Rayetta Humphrey, PT CLT [phone] 02/28/2020, 9:24 AM  [place] [address] [place], [place], [address] Phone: [phone]   Fax:  [phone]  Name: Dustin Demeyer Sr. MRN: [id number] Date of Birth: 03/13/1966

## 2020-03-04 ENCOUNTER — Encounter (HOSPITAL_COMMUNITY): Payer: Self-pay

## 2020-03-04 ENCOUNTER — Other Ambulatory Visit: Payer: Self-pay

## 2020-03-04 ENCOUNTER — Ambulatory Visit (HOSPITAL_COMMUNITY)
Admission: RE | Admit: 2020-03-04 | Discharge: 2020-03-04 | Disposition: A | Discharge status: Home / Self Care | Payer: 59 | Source: Ambulatory Visit | Attending: Radiation Oncology | Admitting: Radiation Oncology

## 2020-03-04 DIAGNOSIS — C090 Malignant neoplasm of tonsillar fossa: Secondary | ICD-10-CM | POA: Diagnosis present

## 2020-03-04 MED ORDER — HEPARIN SOD (PORK) LOCK FLUSH 100 UNIT/ML IV SOLN
INTRAVENOUS | Status: AC
  Administered 2020-03-04: 500 [IU] via INTRAVENOUS
  Filled 2020-03-04: qty 5

## 2020-03-04 MED ORDER — HEPARIN SOD (PORK) LOCK FLUSH 100 UNIT/ML IV SOLN: 500.0000 [IU] | Freq: Once | INTRAVENOUS | Status: AC

## 2020-03-04 MED ORDER — IOHEXOL 300 MG/ML  SOLN
100.0000 mL | Freq: Once | INTRAMUSCULAR | Status: AC | PRN
  Administered 2020-03-04: 100 mL via INTRAVENOUS

## 2020-03-05 ENCOUNTER — Inpatient Hospital Stay: Payer: 59 | Attending: Hematology

## 2020-03-05 ENCOUNTER — Ambulatory Visit: Payer: 59 | Admitting: Hematology

## 2020-03-05 ENCOUNTER — Other Ambulatory Visit: Payer: Self-pay

## 2020-03-05 ENCOUNTER — Ambulatory Visit
Admission: RE | Admit: 2020-03-05 | Discharge: 2020-03-05 | Disposition: A | Discharge status: Home / Self Care | Payer: 59 | Source: Ambulatory Visit | Attending: Radiation Oncology | Admitting: Radiation Oncology

## 2020-03-05 ENCOUNTER — Other Ambulatory Visit: Payer: 59

## 2020-03-05 VITALS — BP 133/78 | HR 79 | Temp 98.3°F | Resp 18 | Ht 68.0 in | Wt 258.0 lb

## 2020-03-05 DIAGNOSIS — Z79899 Other long term (current) drug therapy: Secondary | ICD-10-CM | POA: Diagnosis not present

## 2020-03-05 DIAGNOSIS — R630 Anorexia: Secondary | ICD-10-CM | POA: Insufficient documentation

## 2020-03-05 DIAGNOSIS — R599 Enlarged lymph nodes, unspecified: Secondary | ICD-10-CM | POA: Diagnosis not present

## 2020-03-05 DIAGNOSIS — R600 Localized edema: Secondary | ICD-10-CM | POA: Insufficient documentation

## 2020-03-05 DIAGNOSIS — C090 Malignant neoplasm of tonsillar fossa: Secondary | ICD-10-CM

## 2020-03-05 DIAGNOSIS — M47812 Spondylosis without myelopathy or radiculopathy, cervical region: Secondary | ICD-10-CM | POA: Insufficient documentation

## 2020-03-05 DIAGNOSIS — Z923 Personal history of irradiation: Secondary | ICD-10-CM | POA: Insufficient documentation

## 2020-03-05 NOTE — Progress Notes (Signed)
Mr. Dustin Rios presents today for follow-up of radiation to his right tonsil completed on 11/30/2019 and to review CT scan from 03/04/2020  Pain issues, if any: Reports occasional irritation/discomfort to the top of his throat/back of tongue Using a feeding tube?: Not for a week--he's trying to mainly take nutrition orally. He supplements with Boost several times a day. Reports insertion site occasionally feels sore. Met with Joli Allen-RD today as well. Weight changes, if any: Patient still has no appetite  Wt Readings from Last 3 Encounters:  03/05/20 258 lb (117 kg)  03/05/20 257 lb 6 oz (116.7 kg)  02/26/20 253 lb (114.8 kg)   Swallowing issues, if any: Yes--unable to tolearte breads, meat, chips. Food must be in an gravy or sauce, very small bites, and followed by water. Lack of appetite and sensitive gag reflex inhibits him a lot. 02/13/2020 Saw Carl Schinke-SLP: "Pt presents today with WNL/WFL swallowing ability with applesauce and water. No overt s/sx aspiration PNA reported or observed today. Pt does HEP every other day, once/day. Pt states he doesn't do any pitch raise, super supraglottic, and chin tuck against resistance. Pt told SLP rationale for HEP without cues needed" Smoking or chewing tobacco? None Using fluoride trays daily? No--02/26/2020 Saw Dr. Sandi Mariscal Last ENT visit was on: 02/29/2020 Saw Dr. [name]: "Completed treatment in June, still using feeding tube for supplementation. Otherwise back to work and doing reasonably well. Healthy-appearing gentleman in no distress. Breathing and voice are clear. He has mild trismus. Dentition in good shape. Oral cavity and pharynx reveal no new lesions. Indirect exam reveals healthy cords. Pharynx is clear. No palpable adenopathy. Surgical scar well-healed. Nasal exam clear.  Impression & Plans:  Stable posttreatment. Recheck again in 3 months. Scheduled for posttreatment CT scan next week."  Other notable issues, if any: Still  dealing with dry mouth, thick saliva, and a very sensitive gag reflex. Started PT last week to help with lymphedema and lack of ROM in right shoulder (goes 3 times a week)  Vitals:   03/05/20 1433  BP: 133/78  Pulse: 79  Resp: 18  Temp: 98.3 F (36.8 C)  SpO2: 100%

## 2020-03-05 NOTE — Progress Notes (Signed)
Nutrition Follow-up:  Patient has completed chemo and radiation for cancer of head and neck.    Met with patient and wife in clinic today.  Patient reports that he has been eating a little more variety of foods recently.  Last night ate 1/2 baked potato, few bites of steak and drank boost plus.  Has eaten the inside out of biscuit with gravy, eggs for breakfast, spaghetti with no meat, french onion soup, potato soup, macaroni and cheese, tomato soup with grilled cheese sandwich dipped in soup and doughnut with chocolate icing.  Drinks 2-4 boost plus mixed with whole milk daily depending on how much he eats.  Has not used the feeding tube for nutrition in about 1 weeks.  Reports dry mouth and thick saliva. Taste is coming back slowly.      Medications: reviewed  Labs: reviewed  Anthropometrics:   Weight 257 lb 6 oz today in RD clinic, stable from 257 lb 3 oz on 9/30.     NUTRITION DIAGNOSIS: Food and nutrition related knowledge deficit improving   INTERVENTION:  Discussed importance of continuing to try soft, moist high calorie, high protein foods Continue 2-4 boost plus daily, until able to eat more solid foods Flush feeding tube with at least 4m of water daily when not in use.   Discussed that tube would need to stay in place at least 4 weeks without using and patient to maintain weight and hydration before considering removal.   Contact information given to patient    MONITORING, EVALUATION, GOAL: weight trends, intake, feeding tube status   NEXT VISIT: Tuesday, November 30th in clinic to ck weight with BJerrell BelfastB. AZenia Resides RBoyd LMcKittrickRegistered Dietitian [phone](mobile)

## 2020-03-05 NOTE — Progress Notes (Signed)
Oncology Nurse Navigator Documentation  I met with Dustin Rios and his wife [name] during his follow up appointment with Dr. Isidore Moos today. He received results of his CT scans from yesterday. He will see Dr. Irene Limbo in several weeks and Dr. Constance Holster in 3 months. I will send a request for him to be scheduled with Sandi Mealy PA of survivorship in 6 months and Dr. Isidore Moos in one year. I will call Mr. Kimura after ENT Conference on 03/13/20 to update him on recommendations regarding future scans. They know to call me with any questions or concerns before then if needed.  Harlow Asa RN, BSN, OCN Head & Neck Oncology Nurse [place] at [facility] Phone # [phone]  Fax # [phone]

## 2020-03-06 ENCOUNTER — Ambulatory Visit (HOSPITAL_COMMUNITY): Payer: 59

## 2020-03-06 ENCOUNTER — Encounter (HOSPITAL_COMMUNITY): Payer: Self-pay

## 2020-03-06 ENCOUNTER — Encounter: Payer: Self-pay | Admitting: Radiation Oncology

## 2020-03-06 DIAGNOSIS — M436 Torticollis: Secondary | ICD-10-CM

## 2020-03-06 DIAGNOSIS — R1310 Dysphagia, unspecified: Secondary | ICD-10-CM

## 2020-03-06 DIAGNOSIS — R293 Abnormal posture: Secondary | ICD-10-CM | POA: Diagnosis not present

## 2020-03-06 DIAGNOSIS — M6281 Muscle weakness (generalized): Secondary | ICD-10-CM

## 2020-03-06 DIAGNOSIS — M25511 Pain in right shoulder: Secondary | ICD-10-CM

## 2020-03-06 DIAGNOSIS — Z483 Aftercare following surgery for neoplasm: Secondary | ICD-10-CM

## 2020-03-06 NOTE — Progress Notes (Signed)
Radiation Oncology         [phone] ________________________________  Name: Dustin Leiter Sr. MRN: [id number]  Date: 03/05/2020  DOB: Jun 12, 1965  Follow-Up Visit Note  CC: Dustin Fraise, MD  Dustin Fraise, MD  Diagnosis and Prior Radiotherapy:    C09.0   ICD-10-CM   1. Malignant neoplasm of tonsillar fossa ([place])  C09.0    Cancer Staging Malignant neoplasm of tonsillar fossa ([place]) Staging form: Pharynx - HPV-Mediated Oropharynx, AJCC 8th Edition - Pathologic stage from 10/10/2019: Stage I (pT1, pN1, cM0, p16+) - Signed by Eppie Gibson, MD on 10/11/2019  Squamous cell carcinoma of head and neck ([place]) Staging form: Pharynx - HPV-Mediated Oropharynx, AJCC 8th Edition - Clinical stage from 08/11/2019: Stage I (cT0, cN1, cM0, p16+) - Signed by Eppie Gibson, MD on 08/12/2019   Radiation Treatment Dates: 10/19/2019 through 11/30/2019 Site Technique Total Dose (Gy) Dose per Fx (Gy) Completed Fx Beam Energies  Tonsil, Right: HN_Rt_tonsil IMRT 60/60 2 30/30 6X   CHIEF COMPLAINT: Tonsil cancer  Narrative:    Dustin Rios presents today for follow-up of radiation to his right tonsil completed on 11/30/2019 and to review CT scan from 03/04/2020  Pain issues, if any: Reports occasional irritation/discomfort to the top of his throat/back of tongue Using a feeding tube?: Not for a week--he's trying to mainly take nutrition orally. He supplements with Boost several times a day. Reports insertion site occasionally feels sore. Met with Joli Allen-RD today as well. Weight changes, if any: Patient still has no appetite  Wt Readings from Last 3 Encounters:  03/05/20 258 lb (117 kg)  03/05/20 257 lb 6 oz (116.7 kg)  02/26/20 253 lb (114.8 kg)   Swallowing issues, if any: Yes--unable to tolearte breads, meat, chips. Food must be in an gravy or sauce, very small bites, and followed by water. Lack of appetite and sensitive gag reflex inhibits him a lot. 02/13/2020 Saw Carl Schinke-SLP: "Pt presents today  with WNL/WFL swallowing ability with applesauce and water. No overt s/sx aspiration PNA reported or observed today. Pt does HEP every other day, once/day. Pt states he doesn't do any pitch raise, super supraglottic, and chin tuck against resistance. Pt told SLP rationale for HEP without cues needed" Smoking or chewing tobacco? None Using fluoride trays daily? No--02/26/2020 Saw Dr. Sandi Mariscal Last ENT visit was on: 02/29/2020 Saw Dr. [name]: "Completed treatment in June, still using feeding tube for supplementation. Otherwise back to work and doing reasonably well. Healthy-appearing gentleman in no distress. Breathing and voice are clear. He has mild trismus. Dentition in good shape. Oral cavity and pharynx reveal no new lesions. Indirect exam reveals healthy cords. Pharynx is clear. No palpable adenopathy. Surgical scar well-healed. Nasal exam clear.  Impression & Plans:  Stable posttreatment. Recheck again in 3 months. Scheduled for posttreatment CT scan next week."  Other notable issues, if any: Still dealing with dry mouth, thick saliva, and a very sensitive gag reflex. Started PT last week to help with lymphedema and lack of ROM in right shoulder (goes 3 times a week)  Vitals:   03/05/20 1433  BP: 133/78  Pulse: 79  Resp: 18  Temp: 98.3 F (36.8 C)  SpO2: 100%         ALLERGIES:  has No Known Allergies.  Meds: Current Outpatient Medications  Medication Sig Dispense Refill   Colchicine (MITIGARE) 0.6 MG CAPS Take 2 capsules at onset of flare. Then take 1 capsule 1 hour later.  May take 1 capsule up  to twice daily thereafter. 60 capsule 2   dicyclomine (BENTYL) 20 MG tablet Take 1 tablet (20 mg total) by mouth 4 (four) times daily as needed for spasms (abdominal or intestinal spasms). (Patient not taking: Reported on 02/26/2020) 40 tablet 0   HYDROcodone-acetaminophen (HYCET) 7.5-325 mg/15 ml solution Take 15 mLs by mouth 4 (four) times daily as needed for moderate pain. 420  mL 0   lidocaine-prilocaine (EMLA) cream      loratadine (EQ LORATADINE) 10 MG tablet Take 10 mg by mouth daily.      LORazepam (ATIVAN) 0.5 MG tablet Take 1-2 tablets (0.5-1 mg total) by mouth every 8 (eight) hours as needed for anxiety or sleep (chemotherapy related nausea). 60 tablet 0   Nutritional Supplements (FEEDING SUPPLEMENT, OSMOLITE 1.5 CAL,) LIQD Begin Osmolite 1.5 via PEG.  Advance as tolerated to goal rate of 7 cartons daily in 4 feedings with 120 mL of free water before and after each bolus feeding.  In addition mix 30 mL Prosource with 60 mL of water 4 times daily and infuse into PEG with 60 mL free water after each Prosource administration.  Drink an additional 10 ounces of water daily or put into PEG.  If it provides 2885 cal, 144.3 g protein and 2887 mL free water/100% estimated needs. 1659 mL 3   ondansetron (ZOFRAN) 4 MG tablet Take 1 tablet (4 mg total) by mouth every 8 (eight) hours as needed for nausea or vomiting. 12 tablet 0   pantoprazole (PROTONIX) 40 MG tablet Take 1 tablet (40 mg total) by mouth daily before breakfast. 30 tablet 2   prochlorperazine (COMPAZINE) 10 MG tablet Take 1 tablet (10 mg total) by mouth every 6 (six) hours as needed for nausea or vomiting. 30 tablet 0   promethazine (PHENERGAN) 25 MG suppository Place 1 suppository (25 mg total) rectally every 6 (six) hours as needed for nausea or vomiting. (Patient not taking: Reported on 02/26/2020) 12 suppository 1   senna-docusate (SENNA S) 8.6-50 MG tablet Take 2 tablets by mouth at bedtime as needed for mild constipation or moderate constipation. (Patient not taking: Reported on 02/26/2020) 60 tablet 1   sodium fluoride (FLUORISHIELD) 1.1 % GEL dental gel APPLY TO TOOTHBRUSH OR IN FLUORIDE TRAYS AS DIRECTED. REPEAT NIGHTLY. SPIT OUT EXCESS DO NOT SWALLOW. DO NOT RINSE AFTERWARDS     sodium fluoride (PREVIDENT 5000 PLUS) 1.1 % CREA dental cream Apply to tooth brush or in fluoride trays as directed.   Repeat nightly. Spit out excess-DO NOT swallow. DO NOT rinse afterwards. 2 Tube prn   tamsulosin (FLOMAX) 0.4 MG CAPS capsule Take 1 capsule (0.4 mg total) by mouth daily. 7 capsule 0   No current facility-administered medications for this encounter.   Facility-Administered Medications Ordered in Other Encounters  Medication Dose Route Frequency Provider Last Rate Last Admin   0.9 %  sodium chloride infusion   Intravenous Continuous Monia Sabal, PA-C        Physical Findings: The patient is in no acute distress. Patient is alert and oriented. Wt Readings from Last 3 Encounters:  03/05/20 258 lb (117 kg)  03/05/20 257 lb 6 oz (116.7 kg)  02/26/20 253 lb (114.8 kg)    height is $RemoveB'5\' 8"'VdHBDTtW$  (1.727 m) and weight is 258 lb (117 kg). His temperature is 98.3 F (36.8 C). His blood pressure is 133/78 and his pulse is 79. His respiration is 18 and oxygen saturation is 100%. .  General: Alert and oriented, in no acute distress  HEENT: Head is normocephalic.  Oropharynx is notable for no lesions or thrush. Neck: Neck is notable for well approximated surgical scar, right neck.  No palpable masses. Psychiatric: Judgment and insight are intact. Affect is appropriate. Skin: Has healed well over his neck   Lab Findings: Lab Results  Component Value Date   WBC 3.7 (L) 02/08/2020   HGB 12.6 (L) 02/08/2020   HCT 38.0 (L) 02/08/2020   MCV 95.2 02/08/2020   PLT 188 02/08/2020    Lab Results  Component Value Date   TSH 2.930 04/03/2019    Radiographic Findings: CT Soft Tissue Neck W Contrast  Result Date: 03/04/2020 CLINICAL DATA:  Malignant neoplasm tonsillar fossa. Post chemo and radiation. EXAM: CT NECK WITH CONTRAST TECHNIQUE: Multidetector CT imaging of the neck was performed using the standard protocol following the bolus administration of intravenous contrast. CONTRAST:  137mL OMNIPAQUE IOHEXOL 300 MG/ML  SOLN COMPARISON:  CT neck 07/13/2019.  PET-CT 08/10/2019 FINDINGS: Pharynx and  larynx: Mild pharyngeal mucosal and submucosal edema due to radiation. This extends into the larynx. No pharyngeal mass identified. Salivary glands: Parotid normal bilaterally. Atrophic changes in the submandibular glands especially on the right. Thyroid: Negative Lymph nodes: Previously noted enlarged right level 2 lymph node shows marked improvement. There is soft tissue stranding in the area but no well-defined enlarged lymph node is identified in this area. No new adenopathy in the neck. Vascular: Right jugular Port-A-Cath with tip not visualized. Right jugular vein appears occluded above the Port-A-Cath and was patent previously. This may be related to radiation change. Left jugular vein patent. Both carotid arteries patent. Limited intracranial: Negative Visualized orbits: Negative Mastoids and visualized paranasal sinuses: Paranasal sinuses clear bilaterally. Mastoid clear bilaterally. Skeleton: Cervical spondylosis.  No acute skeletal abnormality. Upper chest: Chest CT reported separately from today Other: Post radiation changes seen in the soft tissues of the anterior neck and right neck. The right sternocleidomastoid muscle shows atrophy compared to the prior study with irregularity likely due to radiation change. Right jugular vein occlusion may be due to radiation change. IMPRESSION: Radiation changes in the neck which are most prominent in the right neck as described above. Right jugular vein now occluded. Enlarged right level 2 lymph node is considerably smaller now difficult to measure. There are prominent radiation changes in the right neck however no adenopathy is identified on today's study. Electronically Signed   By: Franchot Gallo M.D.   On: 03/04/2020 17:03   CT Chest W Contrast  Result Date: 03/04/2020 CLINICAL DATA:  Head neck cancer surveillance EXAM: CT CHEST WITH CONTRAST TECHNIQUE: Multidetector CT imaging of the chest was performed during intravenous contrast administration.  CONTRAST:  12mL OMNIPAQUE IOHEXOL 300 MG/ML  SOLN COMPARISON:  Neck CT of the same date. Prior imaging from February 01, 2020 of the chest. FINDINGS: Cardiovascular: Normal appearance of the heart and great vessels. RIGHT-sided Port-A-Cath enters via IJ approach terminating at the caval to atrial junction. Central pulmonary vasculature is unremarkable on venous phase assessment. Mediastinum/Nodes: No mediastinal lymphadenopathy. No axillary lymphadenopathy. No hilar lymphadenopathy. Esophagus grossly normal. Soft tissue at the RIGHT low neck better visualized on neck CT, likely related to post treatment changes blending with sternocleidomastoid muscle. See dedicated neck CT for further detail. Lungs/Pleura: Lungs are clear.  Airways are patent. Upper Abdomen: Imaged portions of liver, gallbladder, spleen, pancreas and gastrointestinal tract without acute process. Gastric tube in situ. Kidneys are incompletely imaged. Low-attenuation foci are small and likely small cysts. Musculoskeletal: Spinal degenerative changes.  No acute or destructive bone process. IMPRESSION: 1. Soft tissue at the RIGHT low neck better visualized on neck CT, likely related to post treatment changes blending with sternocleidomastoid muscle. See dedicated neck CT for further detail. 2. No evidence of metastatic disease in the chest. Electronically Signed   By: Zetta Bills M.D.   On: 03/04/2020 17:51    Impression/Plan:  History of tonsil cancer, s/p adjuvant ChRT.  He is doing well symptomatically.  I personally reviewed his imaging.  I consider him in remission.  He does have some scar tissue that is consistent with prior surgery and radiation, concentrated in the right neck.  We will review his imaging at tumor board.  We will let him know if imaging is recommended in the next several months for surveillance.  I sense that the patient would like to undergo a least 1 more set of scans for reassurance if appropriate.  He will see Dr.  Irene Limbo in several weeks and Dr. Constance Holster in 3 months.  We will refer him to survivorship for additional follow-up (with Sandi Mealy ) in the cancer center in 40-months and I will see him in a year, sooner if needed.  On date of service, in total, I spent 25 minutes on this encounter.  He was seen in person.   _____________________________________   Eppie Gibson, MD

## 2020-03-06 NOTE — Therapy (Addendum)
[place], [place], [address] Phone: [phone]   Fax:  [phone]  Physical Therapy Treatment  Patient Details  Name: Dustin Murtagh Sr. MRN: [id number] Date of Birth: 1965/06/27 Referring Provider (PT): Dr. Irene Limbo   Encounter Date: 03/06/2020   PT End of Session - 03/06/20 1445    Visit Number 3    Number of Visits 18    Date for PT Re-Evaluation 04/08/20    Progress Note Due on Visit 18    PT Start Time 1442    PT Stop Time 1527    PT Time Calculation (min) 45 min    Activity Tolerance Patient tolerated treatment well    Behavior During Therapy [facility] for tasks assessed/performed           Past Medical History:  Diagnosis Date  . Arthritis   . Change in stool 02/22/2017  . Family history of cancer   . Family history of uterine cancer   . Gout 02/22/2017  . Hyperlipidemia   . Sleep apnea    Uses CPAP  . Tonsil cancer [facility]) DX'd 07/2019   metastatic    Past Surgical History:  Procedure Laterality Date  . CARPAL TUNNEL RELEASE Right 04/10/2019  . DIRECT LARYNGOSCOPY N/A 08/21/2019   Procedure: DIRECT LARYNGOSCOPY - WITH BX;  Surgeon: [name], MD;  Location: [place];  Service: ENT;  Laterality: N/A;  . IR GASTROSTOMY TUBE MOD SED  10/13/2019  . IR IMAGING GUIDED PORT INSERTION  10/13/2019  . NECK DISSECTION  09/11/2019  . RADICAL NECK DISSECTION Right 09/11/2019   Procedure: RADICAL NECK DISSECTION;  Surgeon: [name], MD;  Location: [place];  Service: ENT;  Laterality: Right;  . TONSILLECTOMY Bilateral 08/21/2019   Procedure: TONSILLECTOMY;  Surgeon: [name], MD;  Location: [place];  Service: ENT;  Laterality: Bilateral;  . WISDOM TOOTH EXTRACTION      There were no vitals filed for this visit.   Subjective Assessment - 03/06/20 1447    Subjective Pt stated he is compliant with HEP every other day.  Reprts he has been doing functional activities like  racking at home.  No real pain today  Pt's main concern with Rt arm and neck.    Pertinent History Rt tonsil cancer post tonsillectomy and adnoidectomy 08/21/19 with scheduled neck dissection on 09/11/19.  Radiation and chemotherapy completed July 22,2021  Still has a feeding tube because he has no appetite.    Patient Stated Goals Improve neck motion so he can see to back up when driving,, improve Right shoulder ROM for reaching    Currently in Pain? No/denies                             [facility] Adult PT Treatment/Exercise - 03/06/20 0001      Posture/Postural Control   Posture/Postural Control Postural limitations    Postural Limitations Rounded Shoulders;Forward head;Increased thoracic kyphosis      Exercises   Exercises Shoulder      Neck Exercises: Seated   Neck Retraction 10 reps;3 secs    Other Seated Exercise 3D cervical excursion 5x     Other Seated Exercise scapular retraction 10x; mouth opening 10x      Shoulder Exercises: Seated   Flexion 10 reps    Flexion Limitations table slides 10x 10"    Abduction Right;10 reps    ABduction Limitations table slide 10x  10"      Shoulder Exercises: Pullies   Flexion Both    Flexion Limitations 2 minutes    Scaption Both    Scaption Limitations 2 minutes                       PT Long Term Goals - 02/28/20 0919      PT LONG TERM GOAL #1   Title Pt will have active shoulder flexion and scaption atleast 130 degrees for improved reaching ability    Time 6    Period Weeks    Status On-going      PT LONG TERM GOAL #2   Title Pt will note improved ability to look over shoulder to back up while driving without turning his entire body    Time 6    Period Weeks    Status On-going      PT LONG TERM GOAL #3   Title pt will demonstrate ability to open mouth to 25 mm or greater for improved ability to eat    Time 6    Period Weeks    Status On-going      PT LONG TERM GOAL #4   Title Right shoulder  strength atleast 4-4+/5 for improved use of right UE    Time 6    Period Weeks    Status On-going                 Plan - 03/06/20 1510    Clinical Impression Statement Primary focus with shoulder ROM exercises.  Added table slides to HEP as well as pullies for flexion and scaption.  Educated importance of posture for neck pain control and mobility.  Induration at submental area and cervical swelling present.  Added mouth opening exercises to assist with eating.  No reports of pain through session.    Personal Factors and Comorbidities Comorbidity 2    Comorbidities Modified radical neck dissection with spinal accessory nerve not preserved, post chemo and radiation    Examination-Activity Limitations Reach Overhead;Self Feeding;Dressing    Examination-Participation Restrictions Occupation;Driving;Other    Stability/Clinical Decision Making Stable/Uncomplicated    Clinical Decision Making Low    Rehab Potential Excellent    PT Frequency 3x / week    PT Duration 6 weeks    PT Treatment/Interventions ADLs/Self Care Home Management;Neuromuscular re-education;Passive range of motion;Manual techniques;Manual lymph drainage;Patient/family education;Therapeutic exercise;Therapeutic activities    PT Next Visit Plan progress prn., mouth opening exs,postural education    PT Home Exercise Plan Post op cervical exs; Cervical rot and SB with 5 sec hold, gentle UT stretches, cervical and scapular retraction:  10/20:  Shoulder wand exercises to improve ROM; 10/27: table slides for flexion and abduction    Consulted and Agree with Plan of Care Patient           Patient will benefit from skilled therapeutic intervention in order to improve the following deficits and impairments:  Increased fascial restricitons, Decreased mobility, Decreased scar mobility, Postural dysfunction, Decreased activity tolerance, Decreased range of motion, Decreased strength, Impaired UE functional use, Decreased knowledge  of precautions, Increased edema  Visit Diagnosis: Abnormal posture  Neck stiffness  Muscle weakness (generalized)  Acute pain of right shoulder  Aftercare following surgery for neoplasm  Dysphagia, unspecified type     Problem List Patient Active Problem List   Diagnosis Date Noted  . Family history of uterine cancer   . Family history of cancer   . Port-A-Cath in place 12/28/2019  .  Hematuria 12/18/2019  . Counseling regarding advance care planning and goals of care 10/12/2019  . Malignant neoplasm of tonsillar fossa ([place]) 09/11/2019  . S/P tonsillectomy 08/21/2019  . Squamous cell carcinoma of head and neck ([place]) 08/10/2019  . Morbid obesity ([place]) 06/08/2019  . Moderate mixed hyperlipidemia not requiring statin therapy 06/08/2019  . OSA (obstructive sleep apnea) 06/08/2019  . Change in stool 02/22/2017  . Gout 02/22/2017   Ihor Austin, LPTA/CLT; CBIS [phone]  Aldona Lento 03/06/2020, 3:37 PM  [place] [address] [place], [place], [address] Phone: [phone]   Fax:  [phone]  Name: Dustin Martos Sr. MRN: [id number] Date of Birth: 03/09/1966

## 2020-03-07 ENCOUNTER — Ambulatory Visit (HOSPITAL_COMMUNITY): Payer: 59

## 2020-03-07 ENCOUNTER — Other Ambulatory Visit: Payer: Self-pay

## 2020-03-07 ENCOUNTER — Encounter (HOSPITAL_COMMUNITY): Payer: Self-pay

## 2020-03-07 DIAGNOSIS — M6281 Muscle weakness (generalized): Secondary | ICD-10-CM

## 2020-03-07 DIAGNOSIS — M25511 Pain in right shoulder: Secondary | ICD-10-CM

## 2020-03-07 DIAGNOSIS — R293 Abnormal posture: Secondary | ICD-10-CM | POA: Diagnosis not present

## 2020-03-07 DIAGNOSIS — M436 Torticollis: Secondary | ICD-10-CM

## 2020-03-07 NOTE — Therapy (Signed)
[place], [place], [address] Phone: [phone]   Fax:  [phone]  Physical Therapy Treatment  Patient Details  Name: Dustin Blanda Sr. MRN: [id number] Date of Birth: [birth year] Referring Provider (PT): Dr. Irene Limbo   Encounter Date: 03/07/2020   PT End of Session - 03/07/20 1052    Visit Number 4    Number of Visits 18    Date for PT Re-Evaluation 04/08/20    Progress Note Due on Visit 18    PT Start Time 1050    PT Stop Time 1132    PT Time Calculation (min) 42 min    Activity Tolerance Patient tolerated treatment well    Behavior During Therapy [facility] for tasks assessed/performed           Past Medical History:  Diagnosis Date  . Arthritis   . Change in stool 02/22/2017  . Family history of cancer   . Family history of uterine cancer   . Gout 02/22/2017  . Hyperlipidemia   . Sleep apnea    Uses CPAP  . Tonsil cancer [facility]) DX'd 07/2019   metastatic    Past Surgical History:  Procedure Laterality Date  . CARPAL TUNNEL RELEASE Right 04/10/2019  . DIRECT LARYNGOSCOPY N/A 08/21/2019   Procedure: DIRECT LARYNGOSCOPY - WITH BX;  Surgeon: [name], MD;  Location: [place];  Service: ENT;  Laterality: N/A;  . IR GASTROSTOMY TUBE MOD SED  10/13/2019  . IR IMAGING GUIDED PORT INSERTION  10/13/2019  . NECK DISSECTION  09/11/2019  . RADICAL NECK DISSECTION Right 09/11/2019   Procedure: RADICAL NECK DISSECTION;  Surgeon: [name], MD;  Location: [place];  Service: ENT;  Laterality: Right;  . TONSILLECTOMY Bilateral 08/21/2019   Procedure: TONSILLECTOMY;  Surgeon: [name], MD;  Location: [place];  Service: ENT;  Laterality: Bilateral;  . WISDOM TOOTH EXTRACTION      There were no vitals filed for this visit.   Subjective Assessment - 03/07/20 1052    Subjective Pt stated he is feeling good today, some tightness Rt shoulder.    Pertinent History Rt tonsil cancer post  tonsillectomy and adnoidectomy 08/21/19 with scheduled neck dissection on 09/11/19.  Radiation and chemotherapy completed July 22,2021  Still has a feeding tube because he has no appetite.    Patient Stated Goals Improve neck motion so he can see to back up when driving,, improve Right shoulder ROM for reaching    Currently in Pain? No/denies              [facility] PT Assessment - 03/07/20 0001      AROM   Right Shoulder Flexion 1287 Degrees    Right Shoulder ABduction 88 Degrees                         [facility] Adult PT Treatment/Exercise - 03/07/20 0001      Posture/Postural Control   Posture/Postural Control Postural limitations    Postural Limitations Rounded Shoulders;Forward head;Increased thoracic kyphosis      Exercises   Exercises Shoulder      Neck Exercises: Seated   Other Seated Exercise ER RTB 10x5"    Other Seated Exercise scapular retraction 10x RTB      Shoulder Exercises: Supine   Protraction Strengthening;Both;10 reps    Protraction Limitations chest press with dowel rod then protract/retract    Flexion AAROM;10 reps   dowel rod  Flexion Limitations AROM slow and controlled 5x     ABduction AAROM;Right;10 reps    ABduction Limitations dowel rod assistance      Shoulder Exercises: Sidelying   Flexion AROM;10 reps    Flexion Limitations 10" holds      Shoulder Exercises: Pulleys   Flexion 2 minutes    Scaption 2 minutes      Neck Exercises: Stretches   Chest Stretch 2 reps;30 seconds                       PT Long Term Goals - 02/28/20 0919      PT LONG TERM GOAL #1   Title Pt will have active shoulder flexion and scaption atleast 130 degrees for improved reaching ability    Time 6    Period Weeks    Status On-going      PT LONG TERM GOAL #2   Title Pt will note improved ability to look over shoulder to back up while driving without turning his entire body    Time 6    Period Weeks    Status On-going      PT LONG TERM  GOAL #3   Title pt will demonstrate ability to open mouth to 25 mm or greater for improved ability to eat    Time 6    Period Weeks    Status On-going      PT LONG TERM GOAL #4   Title Right shoulder strength atleast 4-4+/5 for improved use of right UE    Time 6    Period Weeks    Status On-going                 Plan - 03/07/20 1247    Clinical Impression Statement Continued primary focus wiht shoulder ROM exercises.  Pt progressing well with improve AROM flexion and abduction.  Added theraband resistance wiht postural strengthening exercises as well as pec stretch following reports of tightness in chest.  Visible musculature fatigue during supine/seated AROM/AAROM based exercises.    Personal Factors and Comorbidities Comorbidity 2    Comorbidities Modified radical neck dissection with spinal accessory nerve not preserved, post chemo and radiation    Examination-Activity Limitations Reach Overhead;Self Feeding;Dressing    Stability/Clinical Decision Making Stable/Uncomplicated    Clinical Decision Making Low    Rehab Potential Excellent    PT Frequency 3x / week    PT Duration 6 weeks    PT Treatment/Interventions ADLs/Self Care Home Management;Neuromuscular re-education;Passive range of motion;Manual techniques;Manual lymph drainage;Patient/family education;Therapeutic exercise;Therapeutic activities    PT Next Visit Plan Progress PRN.  Begin wall walking next session.  Continues postural education.    PT Home Exercise Plan Post op cervical exs; Cervical rot and SB with 5 sec hold, gentle UT stretches, cervical and scapular retraction:  10/20:  Shoulder wand exercises to improve ROM; 10/27: table slides for flexion and abduction           Patient will benefit from skilled therapeutic intervention in order to improve the following deficits and impairments:  Increased fascial restricitons, Decreased mobility, Decreased scar mobility, Postural dysfunction, Decreased activity  tolerance, Decreased range of motion, Decreased strength, Impaired UE functional use, Decreased knowledge of precautions, Increased edema  Visit Diagnosis: Abnormal posture  Neck stiffness  Muscle weakness (generalized)  Acute pain of right shoulder     Problem List Patient Active Problem List   Diagnosis Date Noted  . Family history of uterine cancer   .  Family history of cancer   . Port-A-Cath in place 12/28/2019  . Hematuria 12/18/2019  . Counseling regarding advance care planning and goals of care 10/12/2019  . Malignant neoplasm of tonsillar fossa ([place]) 09/11/2019  . S/P tonsillectomy 08/21/2019  . Squamous cell carcinoma of head and neck ([place]) 08/10/2019  . Morbid obesity ([place]) 06/08/2019  . Moderate mixed hyperlipidemia not requiring statin therapy 06/08/2019  . OSA (obstructive sleep apnea) 06/08/2019  . Change in stool 02/22/2017  . Gout 02/22/2017   Ihor Austin, LPTA/CLT; CBIS [phone]  Aldona Lento 03/07/2020, 12:51 PM  [place] [address] [place], [place], [address] Phone: [phone]   Fax:  [phone]  Name: Dustin Velardi Sr. MRN: [id number] Date of Birth: 11-22-1965

## 2020-03-12 ENCOUNTER — Other Ambulatory Visit: Payer: Self-pay

## 2020-03-12 ENCOUNTER — Ambulatory Visit (HOSPITAL_COMMUNITY): Payer: 59 | Attending: Hematology | Admitting: Physical Therapy

## 2020-03-12 DIAGNOSIS — R293 Abnormal posture: Secondary | ICD-10-CM | POA: Diagnosis not present

## 2020-03-12 DIAGNOSIS — M436 Torticollis: Secondary | ICD-10-CM | POA: Insufficient documentation

## 2020-03-12 DIAGNOSIS — M25511 Pain in right shoulder: Secondary | ICD-10-CM | POA: Diagnosis present

## 2020-03-12 DIAGNOSIS — M6281 Muscle weakness (generalized): Secondary | ICD-10-CM | POA: Diagnosis present

## 2020-03-12 DIAGNOSIS — M25612 Stiffness of left shoulder, not elsewhere classified: Secondary | ICD-10-CM | POA: Diagnosis present

## 2020-03-12 DIAGNOSIS — I890 Lymphedema, not elsewhere classified: Secondary | ICD-10-CM | POA: Insufficient documentation

## 2020-03-12 NOTE — Therapy (Signed)
[place], [place], [address] Phone: [phone]   Fax:  [phone]  Physical Therapy Treatment  Patient Details  Name: Dustin Leverette Sr. MRN: [id number] Date of Birth: [birth year] Referring Provider (PT): Dr. Irene Limbo   Encounter Date: 03/12/2020   PT End of Session - 03/12/20 1352    Visit Number 5    Number of Visits 18    Date for PT Re-Evaluation 04/08/20    Progress Note Due on Visit 18    PT Start Time 0835    PT Stop Time 0920    PT Time Calculation (min) 45 min    Activity Tolerance Patient tolerated treatment well    Behavior During Therapy [facility] for tasks assessed/performed           Past Medical History:  Diagnosis Date  . Arthritis   . Change in stool 02/22/2017  . Family history of cancer   . Family history of uterine cancer   . Gout 02/22/2017  . Hyperlipidemia   . Sleep apnea    Uses CPAP  . Tonsil cancer [facility]) DX'd 07/2019   metastatic    Past Surgical History:  Procedure Laterality Date  . CARPAL TUNNEL RELEASE Right 04/10/2019  . DIRECT LARYNGOSCOPY N/A 08/21/2019   Procedure: DIRECT LARYNGOSCOPY - WITH BX;  Surgeon: [name], MD;  Location: [place];  Service: ENT;  Laterality: N/A;  . IR GASTROSTOMY TUBE MOD SED  10/13/2019  . IR IMAGING GUIDED PORT INSERTION  10/13/2019  . NECK DISSECTION  09/11/2019  . RADICAL NECK DISSECTION Right 09/11/2019   Procedure: RADICAL NECK DISSECTION;  Surgeon: [name], MD;  Location: [place];  Service: ENT;  Laterality: Right;  . TONSILLECTOMY Bilateral 08/21/2019   Procedure: TONSILLECTOMY;  Surgeon: [name], MD;  Location: [place];  Service: ENT;  Laterality: Bilateral;  . WISDOM TOOTH EXTRACTION      There were no vitals filed for this visit.   Subjective Assessment - 03/12/20 0858    Subjective Pt stated he is feeling decent with continued tightness Rt shoulder.  States his PEG tube has been bothering  him some.  Hip major issue is looking for blind spot/looking up due to tightness in his neck, Rt rotators and extensor neck mm.    Pertinent History Rt tonsil cancer post tonsillectomy and adnoidectomy 08/21/19 with scheduled neck dissection on 09/11/19.  Radiation and chemotherapy completed July 22,2021  Still has a feeding tube because he has no appetite.    Patient Stated Goals Improve neck motion so he can see to back up when driving,, improve Right shoulder ROM for reaching                             [facility] Adult PT Treatment/Exercise - 03/12/20 0001      Exercises   Exercises Shoulder      Neck Exercises: Theraband   Scapula Retraction Red;10 reps    Shoulder Extension 10 reps;Red    Rows 10 reps;Red    Shoulder External Rotation 10 reps;Red    Shoulder Internal Rotation 10 reps;Red      Neck Exercises: Standing   Other Standing Exercises wall walk/UE flexion 10X10" facing wall    Other Standing Exercises corner stretch 3X20" holds with cervical extension      Neck Exercises: Seated   Neck Retraction 10 reps;3 secs      Shoulder Exercises:  Seated   Flexion 10 reps      Shoulder Exercises: Pulleys   Flexion 2 minutes    Scaption 2 minutes      Manual Therapy   Manual Therapy Soft tissue mobilization;Manual Lymphatic Drainage (MLD)    Manual therapy comments completed seperately from all other skilled interventions at [facility]    Soft tissue mobilization scar tissue mobilization    Manual Lymphatic Drainage (MLD) self instruction using UE or paint roller                  PT Education - 03/12/20 1343    Education Details scar massage and direction of flow to reduce induration all to improve cervical ROM    Person(s) Educated Patient    Methods Explanation;Demonstration;Tactile cues;Verbal cues    Comprehension Verbalized understanding;Returned demonstration               PT Long Term Goals - 02/28/20 0919      PT LONG TERM GOAL #1   Title  Pt will have active shoulder flexion and scaption atleast 130 degrees for improved reaching ability    Time 6    Period Weeks    Status On-going      PT LONG TERM GOAL #2   Title Pt will note improved ability to look over shoulder to back up while driving without turning his entire body    Time 6    Period Weeks    Status On-going      PT LONG TERM GOAL #3   Title pt will demonstrate ability to open mouth to 25 mm or greater for improved ability to eat    Time 6    Period Weeks    Status On-going      PT LONG TERM GOAL #4   Title Right shoulder strength atleast 4-4+/5 for improved use of right UE    Time 6    Period Weeks    Status On-going                 Plan - 03/12/20 1349    Clinical Impression Statement Pt returns today with general reports of stiffness, most difficulty looking up due to tight anterior neck and chest mm.  Instructed with gentle scar massage to help with this.  Completed theraband exercises in standing today and added shoulder extension.    Began wall walking and UE flexion at wall to work on ROM.   Pt reported good results and stretch obtained from this.  Instructed with scar tissue massage for neck with extreme tightness palpated.  Informed of possible use of small paint roller in addition to help loosen tissue, deduce edema and induration.  Pt reported improvement following manual today.    Personal Factors and Comorbidities Comorbidity 2    Comorbidities Modified radical neck dissection with spinal accessory nerve not preserved, post chemo and radiation    Examination-Activity Limitations Reach Overhead;Self Feeding;Dressing    Stability/Clinical Decision Making Stable/Uncomplicated    Rehab Potential Excellent    PT Frequency 3x / week    PT Duration 6 weeks    PT Treatment/Interventions ADLs/Self Care Home Management;Neuromuscular re-education;Passive range of motion;Manual techniques;Manual lymph drainage;Patient/family education;Therapeutic  exercise;Therapeutic activities    PT Next Visit Plan Progress postural strengthening and education.  Continue manual for scar and induration reduction to improve cervical ROM    PT Home Exercise Plan Post op cervical exs; Cervical rot and SB with 5 sec hold, gentle UT stretches, cervical and scapular  retraction:  10/20:  Shoulder wand exercises to improve ROM; 10/27: table slides for flexion and abduction           Patient will benefit from skilled therapeutic intervention in order to improve the following deficits and impairments:  Increased fascial restricitons, Decreased mobility, Decreased scar mobility, Postural dysfunction, Decreased activity tolerance, Decreased range of motion, Decreased strength, Impaired UE functional use, Decreased knowledge of precautions, Increased edema  Visit Diagnosis: Abnormal posture  Neck stiffness  Muscle weakness (generalized)     Problem List Patient Active Problem List   Diagnosis Date Noted  . Family history of uterine cancer   . Family history of cancer   . Port-A-Cath in place 12/28/2019  . Hematuria 12/18/2019  . Counseling regarding advance care planning and goals of care 10/12/2019  . Malignant neoplasm of tonsillar fossa ([place]) 09/11/2019  . S/P tonsillectomy 08/21/2019  . Squamous cell carcinoma of head and neck ([place]) 08/10/2019  . Morbid obesity ([place]) 06/08/2019  . Moderate mixed hyperlipidemia not requiring statin therapy 06/08/2019  . OSA (obstructive sleep apnea) 06/08/2019  . Change in stool 02/22/2017  . Gout 02/22/2017   Teena Irani, PTA/CLT [phone]  Teena Irani 03/12/2020, 1:53 PM  [place] [address] [place], [place], [address] Phone: [phone]   Fax:  [phone]  Name: Dustin Satter Sr. MRN: [id number] Date of Birth: 01-Mar-1966

## 2020-03-13 ENCOUNTER — Ambulatory Visit (HOSPITAL_COMMUNITY): Payer: 59 | Admitting: Physical Therapy

## 2020-03-13 DIAGNOSIS — M6281 Muscle weakness (generalized): Secondary | ICD-10-CM

## 2020-03-13 DIAGNOSIS — M25511 Pain in right shoulder: Secondary | ICD-10-CM

## 2020-03-13 DIAGNOSIS — R293 Abnormal posture: Secondary | ICD-10-CM

## 2020-03-13 DIAGNOSIS — M436 Torticollis: Secondary | ICD-10-CM

## 2020-03-13 NOTE — Therapy (Signed)
[place], [place], [address] Phone: [phone]   Fax:  [phone]  Physical Therapy Treatment  Patient Details  Name: Dustin Closson Sr. MRN: [id number] Date of Birth: Jan 18, 1966 Referring Provider (PT): Dr. Irene Limbo   Encounter Date: 03/13/2020   PT End of Session - 03/13/20 0936    Visit Number 6    Number of Visits 18    Date for PT Re-Evaluation 04/08/20    Progress Note Due on Visit 18    PT Start Time 0838    PT Stop Time 0920    PT Time Calculation (min) 42 min    Activity Tolerance Patient tolerated treatment well    Behavior During Therapy [facility] for tasks assessed/performed           Past Medical History:  Diagnosis Date  . Arthritis   . Change in stool 02/22/2017  . Family history of cancer   . Family history of uterine cancer   . Gout 02/22/2017  . Hyperlipidemia   . Sleep apnea    Uses CPAP  . Tonsil cancer [facility]) DX'd 07/2019   metastatic    Past Surgical History:  Procedure Laterality Date  . CARPAL TUNNEL RELEASE Right 04/10/2019  . DIRECT LARYNGOSCOPY N/A 08/21/2019   Procedure: DIRECT LARYNGOSCOPY - WITH BX;  Surgeon: [name], MD;  Location: [place];  Service: ENT;  Laterality: N/A;  . IR GASTROSTOMY TUBE MOD SED  10/13/2019  . IR IMAGING GUIDED PORT INSERTION  10/13/2019  . NECK DISSECTION  09/11/2019  . RADICAL NECK DISSECTION Right 09/11/2019   Procedure: RADICAL NECK DISSECTION;  Surgeon: [name], MD;  Location: [place];  Service: ENT;  Laterality: Right;  . TONSILLECTOMY Bilateral 08/21/2019   Procedure: TONSILLECTOMY;  Surgeon: [name], MD;  Location: [place];  Service: ENT;  Laterality: Bilateral;  . WISDOM TOOTH EXTRACTION      There were no vitals filed for this visit.   Subjective Assessment - 03/13/20 0845    Subjective pt states it's alittle sore in the mornings in his Rt shoulder and into his neck  but really no pain..  STates  he has not tried self massage yet or puchased a paint roller.    Currently in Pain? No/denies                             [facility] Adult PT Treatment/Exercise - 03/13/20 0001      Neck Exercises: Theraband   Scapula Retraction Red;10 reps    Shoulder Extension 10 reps;Red    Rows 10 reps;Red    Shoulder External Rotation 10 reps;Red    Shoulder Internal Rotation 10 reps;Red      Neck Exercises: Standing   Other Standing Exercises wall walk/UE flexion 10X10" facing wall, UE flexion with back on wall 10X (115 degrees Rt flexion)    Other Standing Exercises corner stretch 3X20" holds with cervical extension      Neck Exercises: Seated   Neck Retraction 3 secs;15 reps    Other Seated Exercise corner stretch 3X30"    Other Seated Exercise scapular retraction 10x      Shoulder Exercises: Pulleys   Flexion 2 minutes    Scaption 2 minutes                       PT Long Term Goals - 02/28/20 3329  PT LONG TERM GOAL #1   Title Pt will have active shoulder flexion and scaption atleast 130 degrees for improved reaching ability    Time 6    Period Weeks    Status On-going      PT LONG TERM GOAL #2   Title Pt will note improved ability to look over shoulder to back up while driving without turning his entire body    Time 6    Period Weeks    Status On-going      PT LONG TERM GOAL #3   Title pt will demonstrate ability to open mouth to 25 mm or greater for improved ability to eat    Time 6    Period Weeks    Status On-going      PT LONG TERM GOAL #4   Title Right shoulder strength atleast 4-4+/5 for improved use of right UE    Time 6    Period Weeks    Status On-going                 Plan - 03/13/20 0935    Clinical Impression Statement Pt returns today with some mild soreness from new exercises.  Continued with established therex with added UE flexion against wall.  Max flexion obtained 115 degrees with Rt during this exercise.   Encouraged to obtain a roller and complete self massage at home.  Pt reports he can tell he is slowly improving.    Personal Factors and Comorbidities Comorbidity 2    Comorbidities Modified radical neck dissection with spinal accessory nerve not preserved, post chemo and radiation    Examination-Activity Limitations Reach Overhead;Self Feeding;Dressing    Stability/Clinical Decision Making Stable/Uncomplicated    Rehab Potential Excellent    PT Frequency 3x / week    PT Duration 6 weeks    PT Treatment/Interventions ADLs/Self Care Home Management;Neuromuscular re-education;Passive range of motion;Manual techniques;Manual lymph drainage;Patient/family education;Therapeutic exercise;Therapeutic activities    PT Next Visit Plan Progress postural strengthening and education.  Continue manual for scar and induration reduction to improve cervical ROM    PT Home Exercise Plan Post op cervical exs; Cervical rot and SB with 5 sec hold, gentle UT stretches, cervical and scapular retraction:  10/20:  Shoulder wand exercises to improve ROM; 10/27: table slides for flexion and abduction           Patient will benefit from skilled therapeutic intervention in order to improve the following deficits and impairments:  Increased fascial restricitons, Decreased mobility, Decreased scar mobility, Postural dysfunction, Decreased activity tolerance, Decreased range of motion, Decreased strength, Impaired UE functional use, Decreased knowledge of precautions, Increased edema  Visit Diagnosis: Abnormal posture  Neck stiffness  Muscle weakness (generalized)  Acute pain of right shoulder     Problem List Patient Active Problem List   Diagnosis Date Noted  . Family history of uterine cancer   . Family history of cancer   . Port-A-Cath in place 12/28/2019  . Hematuria 12/18/2019  . Counseling regarding advance care planning and goals of care 10/12/2019  . Malignant neoplasm of tonsillar fossa ([place])  09/11/2019  . S/P tonsillectomy 08/21/2019  . Squamous cell carcinoma of head and neck ([place]) 08/10/2019  . Morbid obesity ([place]) 06/08/2019  . Moderate mixed hyperlipidemia not requiring statin therapy 06/08/2019  . OSA (obstructive sleep apnea) 06/08/2019  . Change in stool 02/22/2017  . Gout 02/22/2017   Teena Irani, PTA/CLT [phone]  Roseanne Reno B 03/13/2020, 9:38 AM  [place]  [facility] [place], [place], [address] Phone: [phone]   Fax:  [phone]  Name: Dustin Groeneveld Sr. MRN: [id number] Date of Birth: 1966/01/28

## 2020-03-13 NOTE — Progress Notes (Signed)
Oncology Nurse Navigator Documentation  I spoke with Mr. Dustin Rios today and informed of today's discussion in Head and Neck conference. The recommendation was to continue follow up with Dr. Irene Limbo, Dr. Constance Holster, Sandi Mealy PA, and Dr. Isidore Moos for physcal exams in the future. He voiced his understanding and knows to call me with any questions or concerns that he may have.   Harlow Asa RN, BSN, OCN Head & Neck Oncology Nurse [place] [facility] Phone # [phone]  Fax # [phone]

## 2020-03-15 ENCOUNTER — Encounter (HOSPITAL_COMMUNITY): Payer: Self-pay | Admitting: Physical Therapy

## 2020-03-15 ENCOUNTER — Other Ambulatory Visit: Payer: Self-pay

## 2020-03-15 ENCOUNTER — Ambulatory Visit (HOSPITAL_COMMUNITY): Payer: 59 | Admitting: Physical Therapy

## 2020-03-15 DIAGNOSIS — M436 Torticollis: Secondary | ICD-10-CM

## 2020-03-15 DIAGNOSIS — R293 Abnormal posture: Secondary | ICD-10-CM

## 2020-03-15 DIAGNOSIS — M6281 Muscle weakness (generalized): Secondary | ICD-10-CM

## 2020-03-15 NOTE — Therapy (Signed)
[place], [place], [address] Phone: [phone]   Fax:  [phone]  Physical Therapy Treatment  Patient Details  Name: Dustin Duve Sr. MRN: [id number] Date of Birth: [birth year] Referring Provider (PT): Dr. Irene Limbo   Encounter Date: 03/15/2020   PT End of Session - 03/15/20 0852    Visit Number 7    Number of Visits 18    Date for PT Re-Evaluation 04/08/20    Progress Note Due on Visit 18    PT Start Time 0825    PT Stop Time 0910    PT Time Calculation (min) 45 min    Activity Tolerance Patient tolerated treatment well    Behavior During Therapy [facility] for tasks assessed/performed           Past Medical History:  Diagnosis Date  . Arthritis   . Change in stool 02/22/2017  . Family history of cancer   . Family history of uterine cancer   . Gout 02/22/2017  . Hyperlipidemia   . Sleep apnea    Uses CPAP  . Tonsil cancer [facility]) DX'd 07/2019   metastatic    Past Surgical History:  Procedure Laterality Date  . CARPAL TUNNEL RELEASE Right 04/10/2019  . DIRECT LARYNGOSCOPY N/A 08/21/2019   Procedure: DIRECT LARYNGOSCOPY - WITH BX;  Surgeon: [name], MD;  Location: [place];  Service: ENT;  Laterality: N/A;  . IR GASTROSTOMY TUBE MOD SED  10/13/2019  . IR IMAGING GUIDED PORT INSERTION  10/13/2019  . NECK DISSECTION  09/11/2019  . RADICAL NECK DISSECTION Right 09/11/2019   Procedure: RADICAL NECK DISSECTION;  Surgeon: [name], MD;  Location: [place];  Service: ENT;  Laterality: Right;  . TONSILLECTOMY Bilateral 08/21/2019   Procedure: TONSILLECTOMY;  Surgeon: [name], MD;  Location: [place];  Service: ENT;  Laterality: Bilateral;  . WISDOM TOOTH EXTRACTION      There were no vitals filed for this visit.   Subjective Assessment - 03/15/20 0826    Subjective PT states that he is trying to use his Rt arm a little more.  He has a little soreness when he touches his  shoulder but he would not call it pain. HIs neck motion does not seem to be improving    Pertinent History Rt tonsil cancer post tonsillectomy and adnoidectomy 08/21/19 with scheduled neck dissection on 09/11/19.  Radiation and chemotherapy completed July 22,2021  Still has a feeding tube because he has no appetite.    Patient Stated Goals Improve neck motion so he can see to back up when driving,, improve Right shoulder ROM for reaching    Currently in Pain? No/denies                             [facility] Adult PT Treatment/Exercise - 03/15/20 0001      Exercises   Exercises Shoulder      Neck Exercises: Theraband   Scapula Retraction --    Shoulder Extension --    Rows --    Shoulder External Rotation --    Shoulder Internal Rotation --      Neck Exercises: Standing   Other Standing Exercises wall walk/UE flexion 10X10" facing wall, UE flexion with back on wall 10X (115 degrees Rt flexion)    Other Standing Exercises corner stretch 3X20" holds with cervical extension      Neck Exercises: Seated   Neck  Retraction --    Other Seated Exercise --    Other Seated Exercise --      Shoulder Exercises: Seated   Flexion AAROM;10 reps    Flexion Limitations dowel assisted     Abduction AAROM;10 reps    ABduction Limitations dowel assisted     Diagonals AROM;Right;5 reps    Diagonals Limitations PNF 1 and 2       Shoulder Exercises: Standing   Extension Strengthening;Both;10 reps    Theraband Level (Shoulder Extension) Level 3 (Green)    Row Both;10 reps    Theraband Level (Shoulder Row) Level 3 (Green)      Shoulder Exercises: Pulleys   Flexion 2 minutes    Scaption 2 minutes      Manual Therapy   Manual Therapy Joint mobilization;Passive ROM    Manual therapy comments completed seperately from all other skilled interventions at [facility]    Joint Mobilization to improve cervical ROM     Passive ROM To improve cervical ROM       Neck Exercises: Stretches   Upper  Trapezius Stretch Right;Left;3 reps;30 seconds                       PT Long Term Goals - 02/28/20 0919      PT LONG TERM GOAL #1   Title Pt will have active shoulder flexion and scaption atleast 130 degrees for improved reaching ability    Time 6    Period Weeks    Status On-going      PT LONG TERM GOAL #2   Title Pt will note improved ability to look over shoulder to back up while driving without turning his entire body    Time 6    Period Weeks    Status On-going      PT LONG TERM GOAL #3   Title pt will demonstrate ability to open mouth to 25 mm or greater for improved ability to eat    Time 6    Period Weeks    Status On-going      PT LONG TERM GOAL #4   Title Right shoulder strength atleast 4-4+/5 for improved use of right UE    Time 6    Period Weeks    Status On-going                 Plan - 03/15/20 2951    Clinical Impression Statement Added manual to treatment to improve cervical ROM.  Increased wand exercise to sitting to improve strength and added PNF pattern to pt routine.  Pt continues to show improved gains in ROM of shoulder and cervical areal.    Personal Factors and Comorbidities Comorbidity 2    Comorbidities Modified radical neck dissection with spinal accessory nerve not preserved, post chemo and radiation    Examination-Activity Limitations Reach Overhead;Self Feeding;Dressing    Stability/Clinical Decision Making Stable/Uncomplicated    Rehab Potential Excellent    PT Frequency 3x / week    PT Duration 6 weeks    PT Treatment/Interventions ADLs/Self Care Home Management;Neuromuscular re-education;Passive range of motion;Manual techniques;Manual lymph drainage;Patient/family education;Therapeutic exercise;Therapeutic activities    PT Next Visit Plan Progress postural strengthening and education.  Continue manual for scar and induration reduction to improve cervical ROM    PT Home Exercise Plan Post op cervical exs; Cervical rot and  SB with 5 sec hold, gentle UT stretches, cervical and scapular retraction:  10/20:  Shoulder wand exercises to improve ROM;  10/27: table slides for flexion and abduction; 11/5: instructed to complete wand exercises sitting           Patient will benefit from skilled therapeutic intervention in order to improve the following deficits and impairments:  Increased fascial restricitons, Decreased mobility, Decreased scar mobility, Postural dysfunction, Decreased activity tolerance, Decreased range of motion, Decreased strength, Impaired UE functional use, Decreased knowledge of precautions, Increased edema  Visit Diagnosis: Abnormal posture  Neck stiffness  Muscle weakness (generalized)     Problem List Patient Active Problem List   Diagnosis Date Noted  . Family history of uterine cancer   . Family history of cancer   . Port-A-Cath in place 12/28/2019  . Hematuria 12/18/2019  . Counseling regarding advance care planning and goals of care 10/12/2019  . Malignant neoplasm of tonsillar fossa ([place]) 09/11/2019  . S/P tonsillectomy 08/21/2019  . Squamous cell carcinoma of head and neck ([place]) 08/10/2019  . Morbid obesity ([place]) 06/08/2019  . Moderate mixed hyperlipidemia not requiring statin therapy 06/08/2019  . OSA (obstructive sleep apnea) 06/08/2019  . Change in stool 02/22/2017  . Gout 02/22/2017   Rayetta Humphrey, PT CLT [id number] 03/15/2020, 9:22 AM  [place] [address] [place], [place], [address] Phone: [phone]   Fax:  [phone]  Name: Tymier Lindholm Sr. MRN: [id number] Date of Birth: 1966-04-04

## 2020-03-18 ENCOUNTER — Ambulatory Visit (HOSPITAL_COMMUNITY): Payer: 59 | Admitting: Physical Therapy

## 2020-03-18 ENCOUNTER — Other Ambulatory Visit: Payer: Self-pay

## 2020-03-18 ENCOUNTER — Encounter (HOSPITAL_COMMUNITY): Payer: Self-pay | Admitting: Physical Therapy

## 2020-03-18 DIAGNOSIS — I890 Lymphedema, not elsewhere classified: Secondary | ICD-10-CM

## 2020-03-18 DIAGNOSIS — R293 Abnormal posture: Secondary | ICD-10-CM | POA: Diagnosis not present

## 2020-03-18 DIAGNOSIS — M6281 Muscle weakness (generalized): Secondary | ICD-10-CM

## 2020-03-18 DIAGNOSIS — M436 Torticollis: Secondary | ICD-10-CM

## 2020-03-18 NOTE — Therapy (Signed)
[place], [place], [address] Phone: [phone]   Fax:  [phone]  Physical Therapy Treatment  Patient Details  Name: Dustin Mccalla Sr. MRN: [id number] Date of Birth: Oct 21, 1965 Referring Provider (PT): Dr. Irene Limbo   Encounter Date: 03/18/2020   PT End of Session - 03/18/20 1238    Visit Number 8    Number of Visits 18    Date for PT Re-Evaluation 04/08/20    Progress Note Due on Visit 18    PT Start Time 1145    PT Stop Time 1230    PT Time Calculation (min) 45 min    Activity Tolerance Patient tolerated treatment well    Behavior During Therapy [facility] for tasks assessed/performed           Past Medical History:  Diagnosis Date  . Arthritis   . Change in stool 02/22/2017  . Family history of cancer   . Family history of uterine cancer   . Gout 02/22/2017  . Hyperlipidemia   . Sleep apnea    Uses CPAP  . Tonsil cancer [facility]) DX'd 07/2019   metastatic    Past Surgical History:  Procedure Laterality Date  . CARPAL TUNNEL RELEASE Right 04/10/2019  . DIRECT LARYNGOSCOPY N/A 08/21/2019   Procedure: DIRECT LARYNGOSCOPY - WITH BX;  Surgeon: [name], MD;  Location: [place];  Service: ENT;  Laterality: N/A;  . IR GASTROSTOMY TUBE MOD SED  10/13/2019  . IR IMAGING GUIDED PORT INSERTION  10/13/2019  . NECK DISSECTION  09/11/2019  . RADICAL NECK DISSECTION Right 09/11/2019   Procedure: RADICAL NECK DISSECTION;  Surgeon: [name], MD;  Location: [place];  Service: ENT;  Laterality: Right;  . TONSILLECTOMY Bilateral 08/21/2019   Procedure: TONSILLECTOMY;  Surgeon: [name], MD;  Location: [place];  Service: ENT;  Laterality: Bilateral;  . WISDOM TOOTH EXTRACTION      There were no vitals filed for this visit.   Subjective Assessment - 03/18/20 1151    Subjective Pt states that he was sore Saturday and Sunday but is feeling better nowl.    Pertinent History Rt tonsil  cancer post tonsillectomy and adnoidectomy 08/21/19 with scheduled neck dissection on 09/11/19.  Radiation and chemotherapy completed July 22,2021  Still has a feeding tube because he has no appetite.    Patient Stated Goals Improve neck motion so he can see to back up when driving,, improve Right shoulder ROM for reaching                             [facility] Adult PT Treatment/Exercise - 03/18/20 0001      Exercises   Exercises Shoulder      Neck Exercises: Standing   Other Standing Exercises --    Other Standing Exercises --      Shoulder Exercises: Seated   External Rotation Both;15 reps;Weights    External Rotation Weight (lbs) 2    Flexion AAROM;15 reps    Flexion Limitations dowel assisted     Abduction AAROM;15 reps    ABduction Limitations dowel assisted     Diagonals AROM;Right;5 reps    Diagonals Limitations PNF 1 and 2     Other Seated Exercises AA pt takes arm up as high as possible then therapist lifts a little more and has pt hold for 5 seconds x 5 for flexion and abduction  Shoulder Exercises: Standing   Extension Strengthening;Both;10 reps    Theraband Level (Shoulder Extension) Level 3 (Green)    Row Both;10 reps    Theraband Level (Shoulder Row) Level 3 (Green)      Shoulder Exercises: Pulleys   Flexion 2 minutes    Scaption 2 minutes      Manual Therapy   Manual Therapy Joint mobilization;Passive ROM    Manual therapy comments completed seperately from all other skilled interventions at [facility]    Joint Mobilization to improve cervical ROM     Manual Lymphatic Drainage (MLD) self instruction for 1-8 with therapist completing trunk, cervical and facial to reduce edema.     Passive ROM To improve cervical ROM       Neck Exercises: Stretches   Upper Trapezius Stretch Right;Left;3 reps;30 seconds                  PT Education - 03/18/20 1237    Education Details 1-8 self decongestive techniques    Person(s) Educated Patient     Methods Explanation;Demonstration    Comprehension Verbalized understanding;Returned demonstration               PT Long Term Goals - 02/28/20 0919      PT LONG TERM GOAL #1   Title Pt will have active shoulder flexion and scaption atleast 130 degrees for improved reaching ability    Time 6    Period Weeks    Status On-going      PT LONG TERM GOAL #2   Title Pt will note improved ability to look over shoulder to back up while driving without turning his entire body    Time 6    Period Weeks    Status On-going      PT LONG TERM GOAL #3   Title pt will demonstrate ability to open mouth to 25 mm or greater for improved ability to eat    Time 6    Period Weeks    Status On-going      PT LONG TERM GOAL #4   Title Right shoulder strength atleast 4-4+/5 for improved use of right UE    Time 6    Period Weeks    Status On-going                 Plan - 03/18/20 1238    Clinical Impression Statement Began decongestive techniques for chest and head with pt given 1-8 to complete at home.  PT continues to slowly demonstrate improved AROM; PROM initiated to improve ROM    Personal Factors and Comorbidities Comorbidity 2    Comorbidities Modified radical neck dissection with spinal accessory nerve not preserved, post chemo and radiation    Examination-Activity Limitations Reach Overhead;Self Feeding;Dressing    Stability/Clinical Decision Making Stable/Uncomplicated    Rehab Potential Excellent    PT Frequency 3x / week    PT Duration 6 weeks    PT Treatment/Interventions ADLs/Self Care Home Management;Neuromuscular re-education;Passive range of motion;Manual techniques;Manual lymph drainage;Patient/family education;Therapeutic exercise;Therapeutic activities    PT Next Visit Plan Progress postural strengthening and education.  Continue manual for scar and induration reduction to improve cervical ROM    PT Home Exercise Plan Post op cervical exs; Cervical rot and SB with 5 sec  hold, gentle UT stretches, cervical and scapular retraction:  10/20:  Shoulder wand exercises to improve ROM; 10/27: table slides for flexion and abduction; 11/5: instructed to complete wand exercises sitting; 11/8 1-8 self manual techniques  for chest lymphedema           Patient will benefit from skilled therapeutic intervention in order to improve the following deficits and impairments:  Increased fascial restricitons, Decreased mobility, Decreased scar mobility, Postural dysfunction, Decreased activity tolerance, Decreased range of motion, Decreased strength, Impaired UE functional use, Decreased knowledge of precautions, Increased edema  Visit Diagnosis: Abnormal posture  Neck stiffness  Muscle weakness (generalized)  Lymphedema, not elsewhere classified     Problem List Patient Active Problem List   Diagnosis Date Noted  . Family history of uterine cancer   . Family history of cancer   . Port-A-Cath in place 12/28/2019  . Hematuria 12/18/2019  . Counseling regarding advance care planning and goals of care 10/12/2019  . Malignant neoplasm of tonsillar fossa ([place]) 09/11/2019  . S/P tonsillectomy 08/21/2019  . Squamous cell carcinoma of head and neck ([place]) 08/10/2019  . Morbid obesity ([place]) 06/08/2019  . Moderate mixed hyperlipidemia not requiring statin therapy 06/08/2019  . OSA (obstructive sleep apnea) 06/08/2019  . Change in stool 02/22/2017  . Gout 02/22/2017   Rayetta Humphrey, PT CLT [id number] 03/18/2020, 12:41 PM  [place] [address] [place], [place], [address] Phone: [phone]   Fax:  [phone]  Name: Dustin Allcock Sr. MRN: [id number] Date of Birth: 27-Dec-1965

## 2020-03-19 ENCOUNTER — Ambulatory Visit (HOSPITAL_COMMUNITY): Payer: 59

## 2020-03-19 ENCOUNTER — Encounter (HOSPITAL_COMMUNITY): Payer: Self-pay

## 2020-03-19 DIAGNOSIS — R293 Abnormal posture: Secondary | ICD-10-CM

## 2020-03-19 DIAGNOSIS — M6281 Muscle weakness (generalized): Secondary | ICD-10-CM

## 2020-03-19 DIAGNOSIS — M436 Torticollis: Secondary | ICD-10-CM

## 2020-03-19 DIAGNOSIS — M25511 Pain in right shoulder: Secondary | ICD-10-CM

## 2020-03-19 NOTE — Therapy (Signed)
[place], [place], [address] Phone: [phone]   Fax:  [phone]  Physical Therapy Treatment  Patient Details  Name: Dustin Stern Sr. MRN: [id number] Date of Birth: 10-05-1965 Referring Provider (PT): Dr. Irene Limbo   Encounter Date: 03/19/2020   PT End of Session - 03/19/20 1001    Visit Number 9    Number of Visits 18    Date for PT Re-Evaluation 04/08/20    Progress Note Due on Visit 18    PT Start Time 0956    PT Stop Time 1046    PT Time Calculation (min) 50 min    Activity Tolerance Patient tolerated treatment well    Behavior During Therapy [facility] for tasks assessed/performed           Past Medical History:  Diagnosis Date  . Arthritis   . Change in stool 02/22/2017  . Family history of cancer   . Family history of uterine cancer   . Gout 02/22/2017  . Hyperlipidemia   . Sleep apnea    Uses CPAP  . Tonsil cancer [facility]) DX'd 07/2019   metastatic    Past Surgical History:  Procedure Laterality Date  . CARPAL TUNNEL RELEASE Right 04/10/2019  . DIRECT LARYNGOSCOPY N/A 08/21/2019   Procedure: DIRECT LARYNGOSCOPY - WITH BX;  Surgeon: [name], MD;  Location: [place];  Service: ENT;  Laterality: N/A;  . IR GASTROSTOMY TUBE MOD SED  10/13/2019  . IR IMAGING GUIDED PORT INSERTION  10/13/2019  . NECK DISSECTION  09/11/2019  . RADICAL NECK DISSECTION Right 09/11/2019   Procedure: RADICAL NECK DISSECTION;  Surgeon: [name], MD;  Location: [place];  Service: ENT;  Laterality: Right;  . TONSILLECTOMY Bilateral 08/21/2019   Procedure: TONSILLECTOMY;  Surgeon: [name], MD;  Location: [place];  Service: ENT;  Laterality: Bilateral;  . WISDOM TOOTH EXTRACTION      There were no vitals filed for this visit.   Subjective Assessment - 03/19/20 1000    Subjective Pt stated he liked the manual, stated it was relaxing and has began self care at home without any questions  concerning    Pertinent History Rt tonsil cancer post tonsillectomy and adnoidectomy 08/21/19 with scheduled neck dissection on 09/11/19.  Radiation and chemotherapy completed July 22,2021  Still has a feeding tube because he has no appetite.    Patient Stated Goals Improve neck motion so he can see to back up when driving,, improve Right shoulder ROM for reaching    Currently in Pain? No/denies                             [facility] Adult PT Treatment/Exercise - 03/19/20 0001      Exercises   Exercises Shoulder      Neck Exercises: Standing   Other Standing Exercises wall walk (made to 16th hole) /UE flexion 10X10" facing wall (able to arch Lt UE not Rt) , UE flexion with back on wall 10X (115 degrees Rt flexion)    Other Standing Exercises corner stretch 3X20" holds with cervical extension      Shoulder Exercises: Seated   Flexion AAROM;15 reps    Flexion Limitations dowel assisted     Abduction AAROM;15 reps    ABduction Limitations dowel assisted     Diagonals Limitations PNF 1 and 2     Other Seated Exercises AA pt takes arm up  as high as possible then therapist lifts a little more and has pt hold for 5 seconds x 5 for flexion and abduction       Shoulder Exercises: Standing   Extension Strengthening;Both;10 reps    Theraband Level (Shoulder Extension) Level 3 (Green)    Row Both;10 reps    Theraband Level (Shoulder Row) Level 3 (Green)      Shoulder Exercises: Pulleys   Flexion 2 minutes    Scaption 2 minutes                       PT Long Term Goals - 02/28/20 0919      PT LONG TERM GOAL #1   Title Pt will have active shoulder flexion and scaption atleast 130 degrees for improved reaching ability    Time 6    Period Weeks    Status On-going      PT LONG TERM GOAL #2   Title Pt will note improved ability to look over shoulder to back up while driving without turning his entire body    Time 6    Period Weeks    Status On-going      PT LONG  TERM GOAL #3   Title pt will demonstrate ability to open mouth to 25 mm or greater for improved ability to eat    Time 6    Period Weeks    Status On-going      PT LONG TERM GOAL #4   Title Right shoulder strength atleast 4-4+/5 for improved use of right UE    Time 6    Period Weeks    Status On-going                 Plan - 03/19/20 1054    Clinical Impression Statement Session focus with Rt shoulder mobility and postural strengthening.  Pt fatigues quickly against gravily.  Continues to slowly demonstrated improved AROM.  Continued PROM to improve ROM.    Personal Factors and Comorbidities Comorbidity 2    Comorbidities Modified radical neck dissection with spinal accessory nerve not preserved, post chemo and radiation    Examination-Activity Limitations Reach Overhead;Self Feeding;Dressing    Examination-Participation Restrictions Occupation;Driving;Other    Stability/Clinical Decision Making Stable/Uncomplicated    Clinical Decision Making Low    Rehab Potential Excellent    PT Frequency 3x / week    PT Duration 6 weeks    PT Treatment/Interventions ADLs/Self Care Home Management;Neuromuscular re-education;Passive range of motion;Manual techniques;Manual lymph drainage;Patient/family education;Therapeutic exercise;Therapeutic activities    PT Next Visit Plan 10th visit progress note next session.  Progress postural strengthening and education.  Continue manual for scar and induration reduction to improve cervical ROM    PT Home Exercise Plan Post op cervical exs; Cervical rot and SB with 5 sec hold, gentle UT stretches, cervical and scapular retraction:  10/20:  Shoulder wand exercises to improve ROM; 10/27: table slides for flexion and abduction; 11/5: instructed to complete wand exercises sitting; 11/8 1-8 self manual techniques for chest lymphedema           Patient will benefit from skilled therapeutic intervention in order to improve the following deficits and  impairments:  Increased fascial restricitons, Decreased mobility, Decreased scar mobility, Postural dysfunction, Decreased activity tolerance, Decreased range of motion, Decreased strength, Impaired UE functional use, Decreased knowledge of precautions, Increased edema  Visit Diagnosis: Abnormal posture  Neck stiffness  Muscle weakness (generalized)  Acute pain of right shoulder  Problem List Patient Active Problem List   Diagnosis Date Noted  . Family history of uterine cancer   . Family history of cancer   . Port-A-Cath in place 12/28/2019  . Hematuria 12/18/2019  . Counseling regarding advance care planning and goals of care 10/12/2019  . Malignant neoplasm of tonsillar fossa ([place]) 09/11/2019  . S/P tonsillectomy 08/21/2019  . Squamous cell carcinoma of head and neck ([place]) 08/10/2019  . Morbid obesity ([place]) 06/08/2019  . Moderate mixed hyperlipidemia not requiring statin therapy 06/08/2019  . OSA (obstructive sleep apnea) 06/08/2019  . Change in stool 02/22/2017  . Gout 02/22/2017   Ihor Austin, LPTA/CLT; CBIS [phone]  Aldona Lento 03/19/2020, 11:00 AM  [place], [place], [address] Phone: [phone]   Fax:  [phone]  Name: Prateek Knipple Sr. MRN: [id number] Date of Birth: [birth year]

## 2020-03-19 NOTE — Progress Notes (Signed)
HEMATOLOGY/ONCOLOGY CLINIC NOTE  Date of Service: 03/20/2020  Patient Care Team: Dustin Fraise, MD as PCP - General (Family Medicine) Dustin Marble, MD as Consulting Physician (Otolaryngology) Dustin Gibson, MD as Attending Physician (Radiation Oncology) Dustin Men, MD (Inactive) as Consulting Physician (Hematology) Dustin Sauers, RN (Inactive) as Oncology Nurse Navigator [place]elt, Stephani Police, RN as Oncology Nurse Navigator (Oncology) Dustin Rios, RD as Dietitian (Nutrition)  REFERRING PHYSICIAN: Claretta Fraise, MD  CHIEF COMPLAINTS/PURPOSE OF CONSULTATION:  Squamous Cell Carcinoma of the Head and Neck  HISTORY OF PRESENTING ILLNESS:   Dustin Leiter Sr. is a wonderful 54 y.o. male who has been referred to [place] by Dustin Fraise, MD for evaluation and management of Squamous Cell Carcinoma of the Head and Neck. Pt is accompanied today by Dustin Rios, wife. The pt reports that he is doing well overall.   The pt reports he is fine. Pt has had both of his COVID19 vaccine. He is healing well from surgery. Some of the nerves were damaged and he has been working it. Part of his salivary gland on the right side was taken out and it burns when he starts to eat. Pt has seen swallowing evaluation today prior to coming. He is back to eating whatever he wants to now. When swallowing he has a burning sensation. Pt has a sleep apnea machine that dries out his throat. Pt also cannot open mouth completely since the dissection.  Pt had a biopsy on March 17th and found it was a squamous cell carcinoma. From his surgery he has various burning, tingling and numbness around his head, neck and shoulder.   Pt has sleep apnea. He also had gout but stopped drinking tea and has not had a flair up since the end of last year. He has no other major medical problems and works with Forensic psychologist. He has had slight hearing loss over the years due to job and being in the [employer]. Pt has never smoked, but  his mother and brohter smoked a lot and pt was exposed to second hand smoke for about 23 years. Many of the pt's family members especially from his mother side have had cancer's that were very aggressive and they passed at early ages.   Of note prior to the patient's visit today, pt has had Surgical Pathology ([id number]) completed on 09/11/19 with results revealing "LYMPH NODE, MODIFIED RADICAL NECK I-IV, REGIONAL DISSECTION: Metastatic carcinoma in 3 of 49 lymph nodes (3/49), with extracapsular extension." Pt has had Surgical Pathology ([id number]) completed on 08/21/19 with results revealing "A. TONSIL, RIGHT: Squamous cell carcinoma, basaloid, p16 positive.  Margins not involved.  Carcinoma focally 1 mm from surgical margin.  See oncology table and comment.  B. TONSIL, LEFT:  Benign tonsil.  No malignancy identified. C. TONGUE, RIGHT BASE, BIOPSY:  Benign tonsillar tissue. No malignancy identified. D. TONGUE, LEFT BASE, BIOPSY: Benign tonsillar tissue.  No malignancy identified. "  Pt has had PET Skull Base to Thigh ([id number]) completed on 08/10/19 with results revealing "1. The 2.8 cm right level IIa lymph node has a maximum SUV of 28.4. A primary lesion is not readily seen. There is some asymmetric palatine tonsillar activity with the left tonsil having maximum SUV of 12.4 and the right 7.8, but no tonsillar mass is observed on the CT data. 2. No compelling findings of active malignancy in the chest, abdomen/pelvis, or skeleton. There is some accentuated anal activity which is likely physiologic given the lack of visible abnormality on the  CT data. 3. Other imaging findings of potential clinical significance: Diffuse hepatic steatosis. Hypodense left renal lesion is probably a cyst but technically too small to characterize." Pt has had [place] Core Biopsy ([id number]) completed on  07/26/19 with results revealing "Successful ultrasound-guided right cervical lymph node/lesion Biopsy." Pt has had of CT  Soft Tissue Neck ([id number]) completed on 07/13/19 with results revealing "4.1 x 3.2 cm necrotic/cystic node or nodal conglomerate at the right level II station likely reflecting nodal metastatic disease. No definite primary mass is identified within the oral cavity, pharynx or larynx. Correlate with direct visualization and consider direct tissue sampling. Additionally, PET-CT may be helpful. Cervical spondylosis as described. A prominent C6-C7 posterior disc osteophyte contributes to at least moderate bony spinal canal stenosis. Multilevel neural foraminal narrowing."  Most recent lab results (09/11/19) of CBC is as follows: all values are WNL except for Glucose at 100, Calcium at 8.7  On review of systems, pt reports various tingling, numbness, burnings sensations around head neck and shoulder, weight loss, insomnia and denies back pain, abdominal pain and any other symptoms.   On PMHx the pt reports tonsillectomy 08/21/19, radical neck dissection 09/11/19, sleep apnea, gout, wisdom tooth extraction, carpal tunnel surgery    On Social Hx the pt reports never smoked, smoked marijuana 30 years ago, works Forensic psychologist, pt was in [employer]   On Family Hx the pt reports brother metastatic lung cancer at age 20, [place] had liver cancer and pancreatic cancer in early 86's of age, mother has uterine cancer at age 65, grandmother on fathers side has stomach cancer, great grandmother had lung cancer    INTERVAL HISTORY: Dustin Riviera Sr. is a wonderful 54 y.o. male who is here for evaluation and management of Squamous Cell Carcinoma of the Head and Neck. The patient's last visit with [place] was on 02/08/2020. The pt reports that he is doing well overall.  The pt reports that he has been seen by Dr. Isidore Rios and Dr. Constance Rios in the interim. Dr. Constance Rios feels that his right arm mobility is impaired by nerve damage. Pt has been getting PT for his right arm and has been receiving lymph gland massages to  improve his lymphedema. Pt plans on following with Dr. Constance Rios every three months.   Pt has not used his G-tube in three weeks. Pt has little to no appetite, but continues to use Boost supplements. He is having difficulty eating dry foods like breads and is drinking a lot of water to assist with his dry mouth. Pt continues following with the swallowing team.   Pt received the flu vaccine a week ago and tolerated it well.   Of note since the patient's last visit, pt has had CT Soft Tissue Neck ([id number]) completed on 03/04/2020 with results revealing "Radiation changes in the neck which are most prominent in the right neck as described above. Right jugular vein now occluded. Enlarged right level 2 lymph node is considerably smaller now difficult to measure. There are prominent radiation changes in the right neck however no adenopathy is identified on today's study."  Pt has had CT Chest ([id number]) completed on 03/04/2020 with results revealing "1. Soft tissue at the RIGHT low neck better visualized on neck CT, likely related to post treatment changes blending with sternocleidomastoid muscle. See dedicated neck CT for further detail. 2. No evidence of metastatic disease in the chest."  Lab results today (03/20/20) of CBC w/diff and BMP is as follows: all values are WNL except for  WBC at 3.7K, RBC at 4.14, Hgb at 12.9, HCT at 37.9, Lymphs Abs at 0.6K, Glucose at 104. 03/20/2020 Magnesium at 1.9  On review of systems, pt reports low appetite, dysgeusia, dizziness, lightheadedness, improved swallowing function, mouth dryness and denies any other symptoms.   MEDICAL HISTORY:  Past Medical History:  Diagnosis Date  . Arthritis   . Change in stool 02/22/2017  . Family history of cancer   . Family history of uterine cancer   . Gout 02/22/2017  . Hyperlipidemia   . Sleep apnea    Uses CPAP  . Tonsil cancer [facility]) DX'd 07/2019   metastatic     SURGICAL HISTORY: Past Surgical History:  Procedure  Laterality Date  . CARPAL TUNNEL RELEASE Right 04/10/2019  . DIRECT LARYNGOSCOPY N/A 08/21/2019   Procedure: DIRECT LARYNGOSCOPY - WITH BX;  Surgeon: [name], MD;  Location: [place];  Service: ENT;  Laterality: N/A;  . IR GASTROSTOMY TUBE MOD SED  10/13/2019  . IR IMAGING GUIDED PORT INSERTION  10/13/2019  . NECK DISSECTION  09/11/2019  . RADICAL NECK DISSECTION Right 09/11/2019   Procedure: RADICAL NECK DISSECTION;  Surgeon: [name], MD;  Location: [place];  Service: ENT;  Laterality: Right;  . TONSILLECTOMY Bilateral 08/21/2019   Procedure: TONSILLECTOMY;  Surgeon: [name], MD;  Location: [place];  Service: ENT;  Laterality: Bilateral;  . WISDOM TOOTH EXTRACTION       SOCIAL HISTORY: Social History   Socioeconomic History  . Marital status: Married    Spouse name: Not on file  . Number of children: 1  . Years of education: Not on file  . Highest education level: Not on file  Occupational History  . Occupation: Clinical biochemist  Tobacco Use  . Smoking status: Never Smoker  . Smokeless tobacco: Never Used  Vaping Use  . Vaping Use: Never used  Substance and Sexual Activity  . Alcohol use: No  . Drug use: No  . Sexual activity: Not on file  Other Topics Concern  . Not on file  Social History Narrative   Patient is married and has 1 son.   Patient has never smoked, used tobacco products.   Patient does not drink.  Patient does not use illicit drugs.   Social Determinants of Health   Financial Resource Strain:   . Difficulty of Paying Living Expenses: Not on file  Food Insecurity:   . Worried About Charity fundraiser in the Last Year: Not on file  . Ran Out of Food in the Last Year: Not on file  Transportation Needs:   . Lack of Transportation (Medical): Not on file  . Lack of Transportation (Non-Medical): Not on file  Physical Activity:   . Days of Exercise per Week: Not on file  . Minutes of Exercise per Session: Not on file   Stress:   . Feeling of Stress : Not on file  Social Connections:   . Frequency of Communication with Friends and Family: Not on file  . Frequency of Social Gatherings with Friends and Family: Not on file  . Attends Religious Services: Not on file  . Active Member of Clubs or Organizations: Not on file  . Attends Archivist Meetings: Not on file  . Marital Status: Not on file  Intimate Partner Violence:   . Fear of Current or Ex-Partner: Not on file  . Emotionally Abused: Not on file  . Physically Abused: Not on file  . Sexually Abused:  Not on file     FAMILY HISTORY: Family History  Problem Relation Age of Onset  . COPD Mother   . Uterine cancer Mother 71  . Lung cancer Brother 37       smoker  . Lung cancer Maternal Grandfather   . Stomach cancer Paternal Grandmother        d. 57s  . Cervical cancer Maternal Aunt   . Lung cancer Maternal Grandmother        smoker  . Prostate cancer Paternal Grandfather   . Pancreatic cancer Cousin 63       mother's maternal first cousin  . Stomach cancer Cousin 20       mother's maternal first cousin  . Cancer Cousin        mother's maternal first cousin with NOS cancer  . Cancer Other        MGMs sister with cancer NOS  . Lung cancer Other        smoker  . Cancer Other        mother's maternal grandfather with NOS cancer     ALLERGIES:   has No Known Allergies.   MEDICATIONS:  Current Outpatient Medications  Medication Sig Dispense Refill  . Colchicine (MITIGARE) 0.6 MG CAPS Take 2 capsules at onset of flare. Then take 1 capsule 1 hour later.  May take 1 capsule up to twice daily thereafter. 60 capsule 2  . dicyclomine (BENTYL) 20 MG tablet Take 1 tablet (20 mg total) by mouth 4 (four) times daily as needed for spasms (abdominal or intestinal spasms). (Patient not taking: Reported on 02/26/2020) 40 tablet 0  . HYDROcodone-acetaminophen (HYCET) 7.5-325 mg/15 ml solution Take 15 mLs by mouth 4 (four) times daily as  needed for moderate pain. 420 mL 0  . lidocaine-prilocaine (EMLA) cream     . loratadine (EQ LORATADINE) 10 MG tablet Take 10 mg by mouth daily.     Marland Kitchen LORazepam (ATIVAN) 0.5 MG tablet Take 1-2 tablets (0.5-1 mg total) by mouth every 8 (eight) hours as needed for anxiety or sleep (chemotherapy related nausea). 60 tablet 0  . Nutritional Supplements (FEEDING SUPPLEMENT, OSMOLITE 1.5 CAL,) LIQD Begin Osmolite 1.5 via PEG.  Advance as tolerated to goal rate of 7 cartons daily in 4 feedings with 120 mL of free water before and after each bolus feeding.  In addition mix 30 mL Prosource with 60 mL of water 4 times daily and infuse into PEG with 60 mL free water after each Prosource administration.  Drink an additional 10 ounces of water daily or put into PEG.  If it provides 2885 cal, 144.3 g protein and 2887 mL free water/100% estimated needs. 1659 mL 3  . ondansetron (ZOFRAN) 4 MG tablet Take 1 tablet (4 mg total) by mouth every 8 (eight) hours as needed for nausea or vomiting. 12 tablet 0  . pantoprazole (PROTONIX) 40 MG tablet Take 1 tablet (40 mg total) by mouth daily before breakfast. 30 tablet 2  . prochlorperazine (COMPAZINE) 10 MG tablet Take 1 tablet (10 mg total) by mouth every 6 (six) hours as needed for nausea or vomiting. 30 tablet 0  . promethazine (PHENERGAN) 25 MG suppository Place 1 suppository (25 mg total) rectally every 6 (six) hours as needed for nausea or vomiting. (Patient not taking: Reported on 02/26/2020) 12 suppository 1  . senna-docusate (SENNA S) 8.6-50 MG tablet Take 2 tablets by mouth at bedtime as needed for mild constipation or moderate constipation. (Patient not taking: Reported  on 02/26/2020) 60 tablet 1  . sodium fluoride (FLUORISHIELD) 1.1 % GEL dental gel APPLY TO TOOTHBRUSH OR IN FLUORIDE TRAYS AS DIRECTED. REPEAT NIGHTLY. SPIT OUT EXCESS DO NOT SWALLOW. DO NOT RINSE AFTERWARDS    . sodium fluoride (PREVIDENT 5000 PLUS) 1.1 % CREA dental cream Apply to tooth brush or in  fluoride trays as directed.  Repeat nightly. Spit out excess-DO NOT swallow. DO NOT rinse afterwards. 2 Tube prn  . tamsulosin (FLOMAX) 0.4 MG CAPS capsule Take 1 capsule (0.4 mg total) by mouth daily. 7 capsule 0   No current facility-administered medications for this visit.   Facility-Administered Medications Ordered in Other Visits  Medication Dose Route Frequency Provider Last Rate Last Admin  . 0.9 %  sodium chloride infusion   Intravenous Continuous Monia Sabal, PA-C         REVIEW OF SYSTEMS:   A 10+ POINT REVIEW OF SYSTEMS WAS OBTAINED including neurology, dermatology, psychiatry, cardiac, respiratory, lymph, extremities, GI, GU, Musculoskeletal, constitutional, breasts, reproductive, HEENT.  All pertinent positives are noted in the HPI.  All others are negative.   PHYSICAL EXAMINATION: ECOG PERFORMANCE STATUS: 1 - Symptomatic but completely ambulatory  Vitals:   03/20/20 0923  BP: 124/69  Pulse: 73  Resp: 18  Temp: (!) 97 F (36.1 C)  SpO2: 99%   Filed Weights   03/20/20 0923  Weight: 253 lb (114.8 kg)   Body mass index is 38.47 kg/m.   GENERAL:alert, in no acute distress and comfortable SKIN: no acute rashes, no significant lesions EYES: conjunctiva are pink and non-injected, sclera anicteric OROPHARYNX: MMM, no exudates, no oropharyngeal erythema or ulceration NECK: supple, no JVD LYMPH:  no palpable lymphadenopathy in the cervical, axillary or inguinal regions LUNGS: clear to auscultation b/l with normal respiratory effort HEART: regular rate & rhythm ABDOMEN:  normoactive bowel sounds , non tender, not distended. No palpable hepatosplenomegaly.  Extremity: no pedal edema PSYCH: alert & oriented x 3 with fluent speech NEURO: no focal motor/sensory deficits  LABORATORY DATA:  I have reviewed the data as listed  CBC Latest Ref Rng & Units 03/20/2020 02/08/2020 01/31/2020  WBC 4.0 - 10.5 K/uL 3.7(L) 3.7(L) 7.7  Hemoglobin 13.0 - 17.0 g/dL 12.9(L) 12.6(L)  12.1(L)  Hematocrit 39 - 52 % 37.9(L) 38.0(L) 37.0(L)  Platelets 150 - 400 K/uL 179 188 175    CMP Latest Ref Rng & Units 03/20/2020 02/08/2020 01/31/2020  Glucose 70 - 99 mg/dL 104(H) 100(H) 139(H)  BUN 6 - 20 mg/dL 20 16 19   Creatinine 0.61 - 1.24 mg/dL 1.05 1.11 1.19  Sodium 135 - 145 mmol/L 139 138 135  Potassium 3.5 - 5.1 mmol/L 3.9 4.0 3.7  Chloride 98 - 111 mmol/L 103 102 99  CO2 22 - 32 mmol/L 29 28 25   Calcium 8.9 - 10.3 mg/dL 9.3 9.2 8.8(L)  Total Protein 6.5 - 8.1 g/dL - - 7.1  Total Bilirubin 0.3 - 1.2 mg/dL - - 0.9  Alkaline Phos 38 - 126 U/L - - 58  AST 15 - 41 U/L - - 25  ALT 0 - 44 U/L - - 29     RADIOGRAPHIC STUDIES: I have personally reviewed the radiological images as listed and agreed with the findings in the report.  PET/CT 08/10/2019: IMPRESSION: 1. The 2.8 cm right level IIa lymph node has a maximum SUV of 28.4. A primary lesion is not readily seen. There is some asymmetric palatine tonsillar activity with the left tonsil having maximum SUV of 12.4 and the  right 7.8, but no tonsillar mass is observed on the CT data. 2. No compelling findings of active malignancy in the chest, abdomen/pelvis, or skeleton. There is some accentuated anal activity which is likely physiologic given the lack of visible abnormality on the CT data. 3. Other imaging findings of potential clinical significance: Diffuse hepatic steatosis. Hypodense left renal lesion is probably a cyst but technically too small to characterize.   Electronically Signed   By: Van Clines M.D.   On: 08/10/2019 15:54   08/21/19 of Surgical Pathology [id number]    SURGICAL PATHOLOGY  * THIS IS AN AMENDED REPORT   CASE: [id number]  PATIENT: [facility]  Surgical Pathology Report   Amendment    Reason for Amendment #1: Other   Clinical History: squamous cell of neck and head (cm)     FINAL MICROSCOPIC DIAGNOSIS:   A. TONSIL, RIGHT:  - Squamous cell carcinoma,  basaloid, p16 positive.  - Margins not involved.  - Carcinoma focally 1 mm from surgical margin.  - See oncology table and comment.   B. TONSIL, LEFT:  - Benign tonsil.  - No malignancy identified.   C. TONGUE, RIGHT BASE, BIOPSY:  - Benign tonsillar tissue.  - No malignancy identified.   D. TONGUE, LEFT BASE, BIOPSY:  - Benign tonsillar tissue.  - No malignancy identified.   ONCOLOGY TABLE:  PHARYNX (OROPHARYNX, HYPOPHARYNX, NASOPHARYNX):  Procedure: Right and left tonsillectomy and right and left tongue base  biopsies.  Tumor Site: Right tonsil.  Tumor Laterality: Right tonsil.  Tumor Focality: Unifocal.  Tumor Size: 1 cm x 0.3 cm (3 mm) depth.  Histologic Type: Squamous cell carcinoma, basaloid type, p16 positive,  EBV negative.  Histologic Grade: Poorly differentiated.  Margins: Free of tumor. Carcinoma focally 1 mm from the nearest  surgical margin.    Distance to closest Margin: 1 mm.  Lymphovascular Invasion: Present.  Perineural Invasion: Not identified.  Regional Lymph Nodes: No lymph nodes submitted with this case. The  previous right neck lymph node needle core biopsy (MCS 20 [id number]) shows  squamous cell carcinoma.  Pathologic Stage Classification (pTNM, AJCC 8th Edition): pT1, pN1, see  comment.    09/11/19 of Surgical Pathology Report [id number])  COMMENT:   Metastatic carcinoma is present in 3 lymph nodes - 1 level 1 lymph node,  1 level 2 lymph node (this metastatic focus also involves skeletal  muscle), and 1 level 3 lymph node (highlighted by a cytokeratin  immunohistochemical stain). Additional studies can be performed (block  A11) upon clinician request.    ASSESSMENT & PLAN:   Dustin Leiter Sr. is a 54 y.o. male with:  1. Rt Tonsilar Basaloid Squamous cell carcinoma , HPV+ve. PT1,pN1,M0. 3/49 LN+ve High risk features - LVI, extranodal extension, high risk morphology. Significant second hand smoke exposure S/p b/l tonsillectomy  on 08/21/2019 S/p rt radical neck dissection   PLAN: -Discussed pt labwork today, 03/20/20; blood counts look good, blood chemistries are nml, Magnesium is WNL. -Discussed 03/04/2020 CT Chest ([id number]) & CT Soft Tissue Neck ([id number]) revealed no evidence of metastatic disease.  -No lab or clinical evidence of Squamous Cell Carcinoma recurrence at this time. Will continue active observation.  -Recommended that the pt continue to eat well, drink at least 48-64 oz of water each day, and walk 20-30 minutes each day.  -Advised pt that we would monitor for decreased thyroid function, which is a possibility after radiation therapy.  -Discussed CDC guidelines regarding the [place] booster. Pt will seek the [facility]  booster in 1 week.  -Will consider G-tube removal after the next visit if oral intake is steady and swallowing is improved. -Recommend pt f/u with Dr. Constance Rios and Dr. Isidore Rios as scheduled.  -Recommend pt continue PT -Will see back in 3 months with labs    FOLLOW UP: RTC with Dr Irene Limbo with portflush and labs in 3 months   The total time spent in the appt was 35 minutes and more than 50% was on counseling and direct patient cares.  All of the patient's questions were answered with apparent satisfaction. The patient knows to call the clinic with any problems, questions or concerns.   Sullivan Lone MD [place] AAHIVMS [facility] Hematology/Oncology Physician [facility]  (Office):       [phone] (Work cell):  [phone] (Fax):           [phone]  03/20/2020 10:17 AM  I, Yevette Edwards, am acting as a scribe for Dr. Sullivan Lone.   .I have reviewed the above documentation for accuracy and completeness, and I agree with the above. Brunetta Genera MD

## 2020-03-20 ENCOUNTER — Encounter: Payer: Self-pay | Admitting: General Practice

## 2020-03-20 ENCOUNTER — Other Ambulatory Visit: Payer: Self-pay

## 2020-03-20 ENCOUNTER — Inpatient Hospital Stay (HOSPITAL_BASED_OUTPATIENT_CLINIC_OR_DEPARTMENT_OTHER): Payer: 59 | Admitting: Hematology

## 2020-03-20 ENCOUNTER — Inpatient Hospital Stay: Payer: 59 | Attending: Hematology

## 2020-03-20 ENCOUNTER — Inpatient Hospital Stay: Payer: 59

## 2020-03-20 VITALS — BP 124/69 | HR 73 | Temp 97.0°F | Resp 18 | Ht 68.0 in | Wt 253.0 lb

## 2020-03-20 DIAGNOSIS — C099 Malignant neoplasm of tonsil, unspecified: Secondary | ICD-10-CM | POA: Diagnosis not present

## 2020-03-20 DIAGNOSIS — G4730 Sleep apnea, unspecified: Secondary | ICD-10-CM | POA: Insufficient documentation

## 2020-03-20 DIAGNOSIS — M129 Arthropathy, unspecified: Secondary | ICD-10-CM | POA: Insufficient documentation

## 2020-03-20 DIAGNOSIS — C779 Secondary and unspecified malignant neoplasm of lymph node, unspecified: Secondary | ICD-10-CM | POA: Diagnosis not present

## 2020-03-20 DIAGNOSIS — R200 Anesthesia of skin: Secondary | ICD-10-CM | POA: Diagnosis not present

## 2020-03-20 DIAGNOSIS — Z95828 Presence of other vascular implants and grafts: Secondary | ICD-10-CM

## 2020-03-20 DIAGNOSIS — Z801 Family history of malignant neoplasm of trachea, bronchus and lung: Secondary | ICD-10-CM | POA: Diagnosis not present

## 2020-03-20 DIAGNOSIS — Z800 Family history of malignant neoplasm of digestive organs: Secondary | ICD-10-CM | POA: Diagnosis not present

## 2020-03-20 DIAGNOSIS — R634 Abnormal weight loss: Secondary | ICD-10-CM | POA: Insufficient documentation

## 2020-03-20 DIAGNOSIS — E785 Hyperlipidemia, unspecified: Secondary | ICD-10-CM | POA: Insufficient documentation

## 2020-03-20 DIAGNOSIS — Z7189 Other specified counseling: Secondary | ICD-10-CM

## 2020-03-20 DIAGNOSIS — G4700 Insomnia, unspecified: Secondary | ICD-10-CM | POA: Diagnosis not present

## 2020-03-20 DIAGNOSIS — Z923 Personal history of irradiation: Secondary | ICD-10-CM | POA: Diagnosis not present

## 2020-03-20 DIAGNOSIS — Z79899 Other long term (current) drug therapy: Secondary | ICD-10-CM | POA: Diagnosis not present

## 2020-03-20 DIAGNOSIS — M47892 Other spondylosis, cervical region: Secondary | ICD-10-CM | POA: Insufficient documentation

## 2020-03-20 DIAGNOSIS — M109 Gout, unspecified: Secondary | ICD-10-CM | POA: Insufficient documentation

## 2020-03-20 DIAGNOSIS — C090 Malignant neoplasm of tonsillar fossa: Secondary | ICD-10-CM

## 2020-03-20 LAB — CBC WITH DIFFERENTIAL/PLATELET
Abs Immature Granulocytes: 0.01 10*3/uL (ref 0.00–0.07)
Basophils Absolute: 0 10*3/uL (ref 0.0–0.1)
Basophils Relative: 1 %
Eosinophils Absolute: 0.1 10*3/uL (ref 0.0–0.5)
Eosinophils Relative: 2 %
HCT: 37.9 % — ABNORMAL LOW (ref 39.0–52.0)
Hemoglobin: 12.9 g/dL — ABNORMAL LOW (ref 13.0–17.0)
Immature Granulocytes: 0 %
Lymphocytes Relative: 16 %
Lymphs Abs: 0.6 10*3/uL — ABNORMAL LOW (ref 0.7–4.0)
MCH: 31.2 pg (ref 26.0–34.0)
MCHC: 34 g/dL (ref 30.0–36.0)
MCV: 91.5 fL (ref 80.0–100.0)
Monocytes Absolute: 0.3 10*3/uL (ref 0.1–1.0)
Monocytes Relative: 9 %
Neutro Abs: 2.7 10*3/uL (ref 1.7–7.7)
Neutrophils Relative %: 72 %
Platelets: 179 10*3/uL (ref 150–400)
RBC: 4.14 MIL/uL — ABNORMAL LOW (ref 4.22–5.81)
RDW: 12.2 % (ref 11.5–15.5)
WBC: 3.7 10*3/uL — ABNORMAL LOW (ref 4.0–10.5)
nRBC: 0 % (ref 0.0–0.2)

## 2020-03-20 LAB — BASIC METABOLIC PANEL
Anion gap: 7 (ref 5–15)
BUN: 20 mg/dL (ref 6–20)
CO2: 29 mmol/L (ref 22–32)
Calcium: 9.3 mg/dL (ref 8.9–10.3)
Chloride: 103 mmol/L (ref 98–111)
Creatinine, Ser: 1.05 mg/dL (ref 0.61–1.24)
GFR, Estimated: >60 mL/min (ref >60)
Glucose, Bld: 104 mg/dL — ABNORMAL HIGH (ref 70–99)
Potassium: 3.9 mmol/L (ref 3.5–5.1)
Sodium: 139 mmol/L (ref 135–145)

## 2020-03-20 LAB — MAGNESIUM: Magnesium: 1.9 mg/dL (ref 1.7–2.4)

## 2020-03-20 MED ORDER — SODIUM CHLORIDE 0.9% FLUSH
10.0000 mL | Freq: Once | INTRAVENOUS | Status: AC
  Administered 2020-03-20: 10 mL
  Filled 2020-03-20: qty 10

## 2020-03-20 MED ORDER — HEPARIN SOD (PORK) LOCK FLUSH 100 UNIT/ML IV SOLN
500.0000 [IU] | Freq: Once | INTRAVENOUS | Status: AC
  Administered 2020-03-20: 500 [IU]
  Filled 2020-03-20: qty 5

## 2020-03-20 NOTE — Progress Notes (Signed)
[place] CSW Progress Notes  Call from patient, he is concerned about stipulations from his employer disability coverage.  He has been told that he needs to apply for Social Security disability in order to keep his employer long term disability coverage.  Patient has brought paperwork to oncologist, oncologist is unable to complete for patient.  Patient determined that he may be able to reach out to his surgeon in order to get the disability evaluation completed.  He is aware from his employer that he must apply for government disability in order to continue to receive his employer sponsored long term disability payments.    Edwyna Shell, LCSW Clinical Social Worker Phone:  [phone]

## 2020-03-20 NOTE — Patient Instructions (Signed)

## 2020-03-21 ENCOUNTER — Encounter: Payer: Self-pay | Admitting: Urology

## 2020-03-21 ENCOUNTER — Ambulatory Visit (INDEPENDENT_AMBULATORY_CARE_PROVIDER_SITE_OTHER): Payer: 59 | Admitting: Urology

## 2020-03-21 VITALS — BP 136/90 | HR 94 | Temp 97.8°F | Ht 68.0 in | Wt 253.0 lb

## 2020-03-21 DIAGNOSIS — R319 Hematuria, unspecified: Secondary | ICD-10-CM | POA: Diagnosis not present

## 2020-03-21 DIAGNOSIS — N200 Calculus of kidney: Secondary | ICD-10-CM | POA: Diagnosis not present

## 2020-03-21 LAB — URINALYSIS, ROUTINE W REFLEX MICROSCOPIC
Bilirubin, UA: NEGATIVE
Glucose, UA: NEGATIVE
Ketones, UA: NEGATIVE
Leukocytes,UA: NEGATIVE
Nitrite, UA: NEGATIVE
Protein,UA: NEGATIVE
RBC, UA: NEGATIVE
Specific Gravity, UA: 1.025 (ref 1.005–1.030)
Urobilinogen, Ur: 0.2 mg/dL (ref 0.2–1.0)
pH, UA: 5.5 (ref 5.0–7.5)

## 2020-03-21 NOTE — Progress Notes (Signed)
Urological Symptom Review  Patient is experiencing the following symptoms: Possible kidney stone   Review of Systems  Gastrointestinal (upper)  : Negative for upper GI symptoms  Gastrointestinal (lower) : Negative for lower GI symptoms  Constitutional : Negative for symptoms  Skin: Negative for skin symptoms  Eyes: Negative for eye symptoms  Ear/Nose/Throat : Negative for Ear/Nose/Throat symptoms  Hematologic/Lymphatic: Negative for Hematologic/Lymphatic symptoms  Cardiovascular : Negative for cardiovascular symptoms  Respiratory : Negative for respiratory symptoms  Endocrine: Negative for endocrine symptoms  Musculoskeletal: Negative for musculoskeletal symptoms  Neurological: Negative for neurological symptoms  Psychologic: Negative for psychiatric symptoms

## 2020-03-21 NOTE — Patient Instructions (Signed)
Dietary Guidelines to Help Prevent Kidney Stones Kidney stones are deposits of minerals and salts that form inside your kidneys. Your risk of developing kidney stones may be greater depending on your diet, your lifestyle, the medicines you take, and whether you have certain medical conditions. Most people can reduce their chances of developing kidney stones by following the instructions below. Depending on your overall health and the type of kidney stones you tend to develop, your dietitian may give you more specific instructions. What are tips for following this plan? Reading food labels  Choose foods with "no salt added" or "low-salt" labels. Limit your sodium intake to less than 1500 mg per day.  Choose foods with calcium for each meal and snack. Try to eat about 300 mg of calcium at each meal. Foods that contain 200-500 mg of calcium per serving include: ? 8 oz (237 ml) of milk, fortified nondairy milk, and fortified fruit juice. ? 8 oz (237 ml) of kefir, yogurt, and soy yogurt. ? 4 oz (118 ml) of tofu. ? 1 oz of cheese. ? 1 cup (300 g) of dried figs. ? 1 cup (91 g) of cooked broccoli. ? 1-3 oz can of sardines or mackerel.  Most people need 1000 to 1500 mg of calcium each day. Talk to your dietitian about how much calcium is recommended for you. Shopping  Buy plenty of fresh fruits and vegetables. Most people do not need to avoid fruits and vegetables, even if they contain nutrients that may contribute to kidney stones.  When shopping for convenience foods, choose: ? Whole pieces of fruit. ? Premade salads with dressing on the side. ? Low-fat fruit and yogurt smoothies.  Avoid buying frozen meals or prepared deli foods.  Look for foods with live cultures, such as yogurt and kefir. Cooking  Do not add salt to food when cooking. Place a salt shaker on the table and allow each person to add his or her own salt to taste.  Use vegetable protein, such as beans, textured vegetable  protein (TVP), or tofu instead of meat in pasta, casseroles, and soups. Meal planning   Eat less salt, if told by your dietitian. To do this: ? Avoid eating processed or premade food. ? Avoid eating fast food.  Eat less animal protein, including cheese, meat, poultry, or fish, if told by your dietitian. To do this: ? Limit the number of times you have meat, poultry, fish, or cheese each week. Eat a diet free of meat at least 2 days a week. ? Eat only one serving each day of meat, poultry, fish, or seafood. ? When you prepare animal protein, cut pieces into small portion sizes. For most meat and fish, one serving is about the size of one deck of cards.  Eat at least 5 servings of fresh fruits and vegetables each day. To do this: ? Keep fruits and vegetables on hand for snacks. ? Eat 1 piece of fruit or a handful of berries with breakfast. ? Have a salad and fruit at lunch. ? Have two kinds of vegetables at dinner.  Limit foods that are high in a substance called oxalate. These include: ? Spinach. ? Rhubarb. ? Beets. ? Potato chips and french fries. ? Nuts.  If you regularly take a diuretic medicine, make sure to eat at least 1-2 fruits or vegetables high in potassium each day. These include: ? Avocado. ? Banana. ? Orange, prune, carrot, or tomato juice. ? Baked potato. ? Cabbage. ? Beans and split   peas. General instructions   Drink enough fluid to keep your urine clear or pale yellow. This is the most important thing you can do.  Talk to your health care provider and dietitian about taking daily supplements. Depending on your health and the cause of your kidney stones, you may be advised: ? Not to take supplements with vitamin C. ? To take a calcium supplement. ? To take a daily probiotic supplement. ? To take other supplements such as magnesium, fish oil, or vitamin B6.  Take all medicines and supplements as told by your health care provider.  Limit alcohol intake to no  more than 1 drink a day for nonpregnant women and 2 drinks a day for men. One drink equals 12 oz of beer, 5 oz of wine, or 1 oz of hard liquor.  Lose weight if told by your health care provider. Work with your dietitian to find strategies and an eating plan that works best for you. What foods are not recommended? Limit your intake of the following foods, or as told by your dietitian. Talk to your dietitian about specific foods you should avoid based on the type of kidney stones and your overall health. Grains Breads. Bagels. Rolls. Baked goods. Salted crackers. Cereal. Pasta. Vegetables Spinach. Rhubarb. Beets. Canned vegetables. Pickles. Olives. Meats and other protein foods Nuts. Nut butters. Large portions of meat, poultry, or fish. Salted or cured meats. Deli meats. Hot dogs. Sausages. Dairy Cheese. Beverages Regular soft drinks. Regular vegetable juice. Seasonings and other foods Seasoning blends with salt. Salad dressings. Canned soups. Soy sauce. Ketchup. Barbecue sauce. Canned pasta sauce. Casseroles. Pizza. Lasagna. Frozen meals. Potato chips. French fries. Summary  You can reduce your risk of kidney stones by making changes to your diet.  The most important thing you can do is drink enough fluid. You should drink enough fluid to keep your urine clear or pale yellow.  Ask your health care provider or dietitian how much protein from animal sources you should eat each day, and also how much salt and calcium you should have each day. This information is not intended to replace advice given to you by your health care provider. Make sure you discuss any questions you have with your health care provider. Document Revised: 08/17/2018 Document Reviewed: 04/07/2016 Elsevier Patient Education  2020 Elsevier Inc.  

## 2020-03-21 NOTE — Progress Notes (Signed)
03/21/2020 9:53 AM   Dustin Leiter Sr. [birth year] [id number]  Referring provider: Claretta Fraise, MD [place],  [place] [address]  Followup hematuria  HPI: Dustin Rios is a 54yo here for followup for gross hematuria. No hematuria episodes since last visit. No significant LUTS. UA shows no blood. He went to the ER on 9/23 for right flank pain and was diagnosed with a 7mm right renal calculus. Pain resolved shortly after CT.  He has gout and has not had an issues in 4-5 months   PMH: Past Medical History:  Diagnosis Date  . Arthritis   . Change in stool 02/22/2017  . Family history of cancer   . Family history of uterine cancer   . Gout 02/22/2017  . Hyperlipidemia   . Sleep apnea    Uses CPAP  . Tonsil cancer [facility]) DX'd 07/2019   metastatic    Surgical History: Past Surgical History:  Procedure Laterality Date  . CARPAL TUNNEL RELEASE Right 04/10/2019  . DIRECT LARYNGOSCOPY N/A 08/21/2019   Procedure: DIRECT LARYNGOSCOPY - WITH BX;  Surgeon: [name], MD;  Location: [place];  Service: ENT;  Laterality: N/A;  . IR GASTROSTOMY TUBE MOD SED  10/13/2019  . IR IMAGING GUIDED PORT INSERTION  10/13/2019  . NECK DISSECTION  09/11/2019  . RADICAL NECK DISSECTION Right 09/11/2019   Procedure: RADICAL NECK DISSECTION;  Surgeon: [name], MD;  Location: [place];  Service: ENT;  Laterality: Right;  . TONSILLECTOMY Bilateral 08/21/2019   Procedure: TONSILLECTOMY;  Surgeon: [name], MD;  Location: [facility];  Service: ENT;  Laterality: Bilateral;  . WISDOM TOOTH EXTRACTION      Home Medications:  Allergies as of 03/21/2020   No Known Allergies     Medication List       Accurate as of March 21, 2020  9:53 AM. If you have any questions, ask your nurse or doctor.        STOP taking these medications   Colchicine 0.6 MG Caps Commonly known as: Mitigare   HYDROcodone-acetaminophen 7.5-325 mg/15 ml solution Commonly known as:  HYCET   ondansetron 4 MG tablet Commonly known as: ZOFRAN   prochlorperazine 10 MG tablet Commonly known as: COMPAZINE   promethazine 25 MG suppository Commonly known as: PHENERGAN   senna-docusate 8.6-50 MG tablet Commonly known as: Senna S   tamsulosin 0.4 MG Caps capsule Commonly known as: Flomax     TAKE these medications   dicyclomine 20 MG tablet Commonly known as: BENTYL Take 1 tablet (20 mg total) by mouth 4 (four) times daily as needed for spasms (abdominal or intestinal spasms).   EQ Loratadine 10 MG tablet Generic drug: loratadine Take 10 mg by mouth daily.   feeding supplement (OSMOLITE 1.5 CAL) Liqd Begin Osmolite 1.5 via PEG.  Advance as tolerated to goal rate of 7 cartons daily in 4 feedings with 120 mL of free water before and after each bolus feeding.  In addition mix 30 mL Prosource with 60 mL of water 4 times daily and infuse into PEG with 60 mL free water after each Prosource administration.  Drink an additional 10 ounces of water daily or put into PEG.  If it provides 2885 cal, 144.3 g protein and 2887 mL free water/100% estimated needs.   lidocaine-prilocaine cream Commonly known as: EMLA   LORazepam 0.5 MG tablet Commonly known as: ATIVAN Take 1-2 tablets (0.5-1 mg total) by mouth every 8 (eight) hours as needed  for anxiety or sleep (chemotherapy related nausea).   pantoprazole 40 MG tablet Commonly known as: Protonix Take 1 tablet (40 mg total) by mouth daily before breakfast.   sodium fluoride 1.1 % Crea dental cream Commonly known as: PreviDent 5000 Plus Apply to tooth brush or in fluoride trays as directed.  Repeat nightly. Spit out excess-DO NOT swallow. DO NOT rinse afterwards.   sodium fluoride 1.1 % Gel dental gel Commonly known as: FLUORISHIELD APPLY TO TOOTHBRUSH OR IN FLUORIDE TRAYS AS DIRECTED. REPEAT NIGHTLY. SPIT OUT EXCESS DO NOT SWALLOW. DO NOT RINSE AFTERWARDS       Allergies: No Known Allergies  Family History: Family  History  Problem Relation Age of Onset  . COPD Mother   . Uterine cancer Mother 24  . Lung cancer Brother 17       smoker  . Lung cancer Maternal Grandfather   . Stomach cancer Paternal Grandmother        d. 63s  . Cervical cancer Maternal Aunt   . Lung cancer Maternal Grandmother        smoker  . Prostate cancer Paternal Grandfather   . Pancreatic cancer Cousin 23       mother's maternal first cousin  . Stomach cancer Cousin 53       mother's maternal first cousin  . Cancer Cousin        mother's maternal first cousin with NOS cancer  . Cancer Other        MGMs sister with cancer NOS  . Lung cancer Other        smoker  . Cancer Other        mother's maternal grandfather with NOS cancer    Social History:  reports that he has never smoked. He has never used smokeless tobacco. He reports that he does not drink alcohol and does not use drugs.  ROS: All other review of systems were reviewed and are negative except what is noted above in HPI  Physical Exam: BP 136/90   Pulse 94   Temp 97.8 F (36.6 C)   Ht 5\' 8"  (1.727 m)   Wt 253 lb (114.8 kg)   BMI 38.47 kg/m   Constitutional:  Alert and oriented, No acute distress. HEENT: [place] AT, moist mucus membranes.  Trachea midline, no masses. Cardiovascular: No clubbing, cyanosis, or edema. Respiratory: Normal respiratory effort, no increased work of breathing. GI: Abdomen is soft, nontender, nondistended, no abdominal masses GU: No CVA tenderness.  Lymph: No cervical or inguinal lymphadenopathy. Skin: No rashes, bruises or suspicious lesions. Neurologic: Grossly intact, no focal deficits, moving all 4 extremities. Psychiatric: Normal mood and affect.  Laboratory Data: Lab Results  Component Value Date   WBC 3.7 (L) 03/20/2020   HGB 12.9 (L) 03/20/2020   HCT 37.9 (L) 03/20/2020   MCV 91.5 03/20/2020   PLT 179 03/20/2020    Lab Results  Component Value Date   CREATININE 1.05 03/20/2020    No results found for:  PSA  No results found for: TESTOSTERONE  Lab Results  Component Value Date   HGBA1C 5.7 04/03/2019    Urinalysis    Component Value Date/Time   COLORURINE YELLOW 02/01/2020 0145   APPEARANCEUR CLEAR 02/01/2020 0145   APPEARANCEUR Clear 12/18/2019 1019   LABSPEC 1.023 02/01/2020 0145   PHURINE 6.0 02/01/2020 0145   GLUCOSEU NEGATIVE 02/01/2020 0145   HGBUR NEGATIVE 02/01/2020 0145   BILIRUBINUR NEGATIVE 02/01/2020 0145   BILIRUBINUR Negative 12/18/2019 1019   KETONESUR 5 (  A) 02/01/2020 0145   PROTEINUR NEGATIVE 02/01/2020 0145   NITRITE NEGATIVE 02/01/2020 0145   LEUKOCYTESUR NEGATIVE 02/01/2020 0145    Lab Results  Component Value Date   LABMICR Comment 12/18/2019   BACTERIA RARE (A) 11/03/2019    Pertinent Imaging: CT 02/01/2020: Images reviewed and discussed with the patient No results found for this or any previous visit.  No results found for this or any previous visit.  No results found for this or any previous visit.  No results found for this or any previous visit.  No results found for this or any previous visit.  No results found for this or any previous visit.  No results found for this or any previous visit.  No results found for this or any previous visit.   Assessment & Plan:    1. Hematuria, unspecified type -resolved - Urinalysis, Routine w reflex microscopic  2. Nephrolithiasis -RTC 6 months with renal US and KUB   No follow-ups on file.  Nicolette Bang, MD  [facility] [place]

## 2020-03-25 ENCOUNTER — Ambulatory Visit (HOSPITAL_COMMUNITY): Payer: 59 | Admitting: Physical Therapy

## 2020-03-25 ENCOUNTER — Other Ambulatory Visit: Payer: Self-pay

## 2020-03-25 DIAGNOSIS — R293 Abnormal posture: Secondary | ICD-10-CM

## 2020-03-25 DIAGNOSIS — M6281 Muscle weakness (generalized): Secondary | ICD-10-CM

## 2020-03-25 DIAGNOSIS — M25511 Pain in right shoulder: Secondary | ICD-10-CM

## 2020-03-25 DIAGNOSIS — I890 Lymphedema, not elsewhere classified: Secondary | ICD-10-CM

## 2020-03-25 DIAGNOSIS — M436 Torticollis: Secondary | ICD-10-CM

## 2020-03-25 NOTE — Therapy (Signed)
[facility] [address] [place], [place], [address] Phone: [phone]   Fax:  [phone]  Physical Therapy Treatment Progress Note Reporting Period 02/26/2020  to  03/25/2020  See note below for Objective Data and Assessment of Progress/Goals.      Patient Details  Name: Dustin Delcarlo Sr. MRN: [id number] Date of Birth: 10-01-1965 Referring Provider (PT): Dr. Irene Limbo   Encounter Date: 03/25/2020   PT End of Session - 03/25/20 1232    Visit Number 10    Number of Visits 18    Date for PT Re-Evaluation 04/08/20    Progress Note Due on Visit 18    PT Start Time 1135    PT Stop Time 1230    PT Time Calculation (min) 55 min    Activity Tolerance Patient tolerated treatment well    Behavior During Therapy [facility] for tasks assessed/performed           Past Medical History:  Diagnosis Date  . Arthritis   . Change in stool 02/22/2017  . Family history of cancer   . Family history of uterine cancer   . Gout 02/22/2017  . Hyperlipidemia   . Sleep apnea    Uses CPAP  . Tonsil cancer [facility]) DX'd 07/2019   metastatic    Past Surgical History:  Procedure Laterality Date  . CARPAL TUNNEL RELEASE Right 04/10/2019  . DIRECT LARYNGOSCOPY N/A 08/21/2019   Procedure: DIRECT LARYNGOSCOPY - WITH BX;  Surgeon: [name], MD;  Location: [place];  Service: ENT;  Laterality: N/A;  . IR GASTROSTOMY TUBE MOD SED  10/13/2019  . IR IMAGING GUIDED PORT INSERTION  10/13/2019  . NECK DISSECTION  09/11/2019  . RADICAL NECK DISSECTION Right 09/11/2019   Procedure: RADICAL NECK DISSECTION;  Surgeon: [name], MD;  Location: [place];  Service: ENT;  Laterality: Right;  . TONSILLECTOMY Bilateral 08/21/2019   Procedure: TONSILLECTOMY;  Surgeon: [name], MD;  Location: [place];  Service: ENT;  Laterality: Bilateral;  . WISDOM TOOTH EXTRACTION      There were no vitals filed for this visit.   Subjective Assessment -  03/25/20 1149    Subjective pt states he is doing well today; was able to clean out his gutters which was somewhat challenging due to reduced ROM in his Rt LE    Currently in Pain? No/denies              [facility] PT Assessment - 03/25/20 1204      Assessment   Referring Provider (PT) Dr. Irene Limbo    Onset Date/Surgical Date 08/23/19    Hand Dominance Right;Left    Prior Therapy swallowing      Precautions   Precaution Comments lymphedema      Restrictions   Weight Bearing Restrictions No      Home Environment   Living Environment Private residence    Living Arrangements Spouse/significant other    Available Help at Discharge Family    Additional Comments live in [place]   Level of [place] On disability   industrial control work     Cognition   Overall Cognitive Status Within Functional Limits for tasks assessed      Observation/Other Assessments   Observations incision healed    Skin Integrity skin darkening from radiation      Posture/Postural Control   Posture/Postural Control Postural limitations    Postural Limitations  Rounded Shoulders;Forward head;Increased thoracic kyphosis      AROM   Right Shoulder Extension 32 Degrees   was 32 degrees   Right Shoulder Flexion 100 Degrees   was 100   Right Shoulder ABduction 75 Degrees   was 75   Right Shoulder Internal Rotation 7 Degrees   was 7   Right Shoulder External Rotation 75 Degrees   was 75   Left Shoulder Extension 54 Degrees    Left Shoulder Flexion 150 Degrees   was 150   Left Shoulder ABduction 150 Degrees   was 150   Left Shoulder Internal Rotation 65 Degrees   was 65   Left Shoulder External Rotation 90 Degrees   was 90   Cervical Flexion 40   was 40 degrees   Cervical Extension 20   was 35 degrees   Cervical - Right Side Bend 38   was 38   Cervical - Left Side Bend 36   was 36   Cervical - Right Rotation 40   was 39   Cervical - Left Rotation 40   was 40                         [facility] Adult PT Treatment/Exercise - 03/25/20 0001      Neck Exercises: Standing   Other Standing Exercises wall walk (made to 16th hole) /UE flexion 10X10" facing wall (able to arch Lt UE not Rt) , UE flexion with back on wall 10X (115 degrees Rt flexion)    Other Standing Exercises corner stretch 3X20" holds with cervical extension      Shoulder Exercises: Seated   Flexion AAROM;15 reps    Flexion Limitations dowel assisted     Abduction AAROM;15 reps    ABduction Limitations dowel assisted     Diagonals Limitations PNF 1 and 2     Other Seated Exercises AA pt takes arm up as high as possible then therapist lifts a little more and has pt hold for 5 seconds x 5 for flexion and abduction       Shoulder Exercises: Pulleys   Flexion 2 minutes    Scaption 2 minutes                       PT Long Term Goals - 03/25/20 1223      PT LONG TERM GOAL #1   Title Pt will have active shoulder flexion and scaption atleast 130 degrees for improved reaching ability    Time 6    Period Weeks    Status On-going      PT LONG TERM GOAL #2   Title Pt will note improved ability to look over shoulder to back up while driving without turning his entire body    Time 6    Period Weeks    Status On-going      PT LONG TERM GOAL #3   Title pt will demonstrate ability to open mouth to 25 mm or greater for improved ability to eat    Time 6    Period Weeks    Status Achieved      PT LONG TERM GOAL #4   Title Right shoulder strength atleast 4-4+/5 for improved use of right UE    Time 6    Period Weeks    Status On-going                 Plan - 03/25/20 1233  Clinical Impression Statement 10th visit Progress note completed this session with overall improvement in Rt shoulder ROM and cervical ROM remaining unchanged.  Pt is able to widen mouth to make larger bites and is doing more functional tasks at home to strengthen and improve shoulder ROM.   Pt still c/o ongoing neck stiffness and inability to rotate for oncoming traffic.  Pt will benefit from continued therapy to address remaining defiicits.    Personal Factors and Comorbidities Comorbidity 2    Comorbidities Modified radical neck dissection with spinal accessory nerve not preserved, post chemo and radiation    Examination-Activity Limitations Reach Overhead;Self Feeding;Dressing    Examination-Participation Restrictions Occupation;Driving;Other    Stability/Clinical Decision Making Stable/Uncomplicated    Rehab Potential Excellent    PT Frequency 3x / week    PT Duration 6 weeks    PT Treatment/Interventions ADLs/Self Care Home Management;Neuromuscular re-education;Passive range of motion;Manual techniques;Manual lymph drainage;Patient/family education;Therapeutic exercise;Therapeutic activities    PT Next Visit Plan Continue to improve postural strengthening, cervical and Rt shoulder ROM.  Continue with  manual for scar and induration reduction to improve cervical ROM    PT Home Exercise Plan Post op cervical exs; Cervical rot and SB with 5 sec hold, gentle UT stretches, cervical and scapular retraction:  10/20:  Shoulder wand exercises to improve ROM; 10/27: table slides for flexion and abduction; 11/5: instructed to complete wand exercises sitting; 11/8 1-8 self manual techniques for chest lymphedema           Patient will benefit from skilled therapeutic intervention in order to improve the following deficits and impairments:  Increased fascial restricitons, Decreased mobility, Decreased scar mobility, Postural dysfunction, Decreased activity tolerance, Decreased range of motion, Decreased strength, Impaired UE functional use, Decreased knowledge of precautions, Increased edema  Visit Diagnosis: Abnormal posture  Neck stiffness  Muscle weakness (generalized)  Acute pain of right shoulder  Lymphedema, not elsewhere classified     Problem List Patient Active  Problem List   Diagnosis Date Noted  . Nephrolithiasis 03/21/2020  . Family history of uterine cancer   . Family history of cancer   . Port-A-Cath in place 12/28/2019  . Hematuria 12/18/2019  . Counseling regarding advance care planning and goals of care 10/12/2019  . Malignant neoplasm of tonsillar fossa ([place]) 09/11/2019  . S/P tonsillectomy 08/21/2019  . Squamous cell carcinoma of head and neck ([place]) 08/10/2019  . Morbid obesity ([place]) 06/08/2019  . Moderate mixed hyperlipidemia not requiring statin therapy 06/08/2019  . OSA (obstructive sleep apnea) 06/08/2019  . Change in stool 02/22/2017  . Gout 02/22/2017   Teena Irani, PTA/CLT [id number]  Teena Irani 03/25/2020, 12:38 PM  [facility] [address] [place], [place], [address] Phone: [phone]   Fax:  [phone]  Name: Dustin Kimball Sr. MRN: [id number] Date of Birth: [birth year]

## 2020-03-27 ENCOUNTER — Other Ambulatory Visit: Payer: Self-pay

## 2020-03-27 ENCOUNTER — Ambulatory Visit (INDEPENDENT_AMBULATORY_CARE_PROVIDER_SITE_OTHER): Payer: 59 | Admitting: Family Medicine

## 2020-03-27 ENCOUNTER — Encounter: Payer: Self-pay | Admitting: Family Medicine

## 2020-03-27 ENCOUNTER — Ambulatory Visit (HOSPITAL_COMMUNITY): Payer: 59 | Admitting: Physical Therapy

## 2020-03-27 VITALS — BP 126/85 | HR 108 | Temp 98.8°F | Ht 68.0 in | Wt 250.2 lb

## 2020-03-27 DIAGNOSIS — G4733 Obstructive sleep apnea (adult) (pediatric): Secondary | ICD-10-CM | POA: Diagnosis not present

## 2020-03-27 DIAGNOSIS — C760 Malignant neoplasm of head, face and neck: Secondary | ICD-10-CM | POA: Diagnosis not present

## 2020-03-27 DIAGNOSIS — I890 Lymphedema, not elsewhere classified: Secondary | ICD-10-CM

## 2020-03-27 DIAGNOSIS — R293 Abnormal posture: Secondary | ICD-10-CM

## 2020-03-27 DIAGNOSIS — M6281 Muscle weakness (generalized): Secondary | ICD-10-CM

## 2020-03-27 DIAGNOSIS — M25511 Pain in right shoulder: Secondary | ICD-10-CM

## 2020-03-27 DIAGNOSIS — M436 Torticollis: Secondary | ICD-10-CM

## 2020-03-27 NOTE — Therapy (Signed)
[place], [place], [address] Phone: [phone]   Fax:  [phone]  Physical Therapy Treatment  Patient Details  Name: Dustin Oran Sr. MRN: [id number] Date of Birth: 06/06/1965 Referring Provider (PT): Dr. Irene Limbo   Encounter Date: 03/27/2020   PT End of Session - 03/27/20 1152    Visit Number 11    Number of Visits 18    Date for PT Re-Evaluation 04/08/20    Progress Note Due on Visit 18    PT Start Time 1130    PT Stop Time 1215    PT Time Calculation (min) 45 min    Activity Tolerance Patient tolerated treatment well    Behavior During Therapy [facility] for tasks assessed/performed           Past Medical History:  Diagnosis Date  . Arthritis   . Change in stool 02/22/2017  . Family history of cancer   . Family history of uterine cancer   . Gout 02/22/2017  . Hyperlipidemia   . Sleep apnea    Uses CPAP  . Tonsil cancer [facility]) DX'd 07/2019   metastatic    Past Surgical History:  Procedure Laterality Date  . CARPAL TUNNEL RELEASE Right 04/10/2019  . DIRECT LARYNGOSCOPY N/A 08/21/2019   Procedure: DIRECT LARYNGOSCOPY - WITH BX;  Surgeon: [name], MD;  Location: [place];  Service: ENT;  Laterality: N/A;  . IR GASTROSTOMY TUBE MOD SED  10/13/2019  . IR IMAGING GUIDED PORT INSERTION  10/13/2019  . NECK DISSECTION  09/11/2019  . RADICAL NECK DISSECTION Right 09/11/2019   Procedure: RADICAL NECK DISSECTION;  Surgeon: [name], MD;  Location: [place];  Service: ENT;  Laterality: Right;  . TONSILLECTOMY Bilateral 08/21/2019   Procedure: TONSILLECTOMY;  Surgeon: [name], MD;  Location: [place];  Service: ENT;  Laterality: Bilateral;  . WISDOM TOOTH EXTRACTION      There were no vitals filed for this visit.   Subjective Assessment - 03/27/20 1130    Subjective PT states that he just wants to get his shoulder and neck moving better.    Pertinent History Rt tonsil  cancer post tonsillectomy and adnoidectomy 08/21/19 with scheduled neck dissection on 09/11/19.  Radiation and chemotherapy completed July 22,2021  Still has a feeding tube because he has no appetite.    Patient Stated Goals Improve neck motion so he can see to back up when driving,, improve Right shoulder ROM for reaching    Currently in Pain? No/denies                             [facility] Adult PT Treatment/Exercise - 03/27/20 0001      Posture/Postural Control   Posture/Postural Control Postural limitations    Postural Limitations Rounded Shoulders;Forward head;Increased thoracic kyphosis      Exercises   Exercises Shoulder      Neck Exercises: Standing   Other Standing Exercises wall walk (made to 16th hole) /UE flexion 10X10" facing wall (able to arch Lt UE not Rt) , UE flexion with back on wall 10X (115 degrees Rt flexion)      Neck Exercises: Supine   Cervical Rotation Both;5 reps    Other Supine Exercise PROM for rotation and SB       Shoulder Exercises: Seated   Flexion AAROM;15 reps    Flexion Limitations dowel assisted     Abduction AAROM;15 reps  ABduction Limitations dowel assisted     Diagonals Limitations PNF 1 and 2     Other Seated Exercises AA pt takes arm up as high as possible then therapist lifts a little more and has pt hold for 5 seconds x 5 for flexion and abduction       Shoulder Exercises: Standing   External Rotation Right;10 reps    Theraband Level (Shoulder External Rotation) Level 3 (Green)    Internal Rotation Strengthening;Right;10 reps    Theraband Level (Shoulder Internal Rotation) Level 3 (Green)    Extension Strengthening;Both;10 reps    Theraband Level (Shoulder Extension) Level 3 (Green)    Row Both;10 reps    Theraband Level (Shoulder Row) Level 3 (Green)      Shoulder Exercises: Pulleys   Flexion 2 minutes    Scaption 2 minutes      Manual Therapy   Manual Therapy Joint mobilization;Passive ROM    Manual therapy  comments completed seperately from all other skilled interventions at [facility]    Joint Mobilization to improve cervical ROM                        PT Long Term Goals - 03/25/20 1223      PT LONG TERM GOAL #1   Title Pt will have active shoulder flexion and scaption atleast 130 degrees for improved reaching ability    Time 6    Period Weeks    Status On-going      PT LONG TERM GOAL #2   Title Pt will note improved ability to look over shoulder to back up while driving without turning his entire body    Time 6    Period Weeks    Status On-going      PT LONG TERM GOAL #3   Title pt will demonstrate ability to open mouth to 25 mm or greater for improved ability to eat    Time 6    Period Weeks    Status Achieved      PT LONG TERM GOAL #4   Title Right shoulder strength atleast 4-4+/5 for improved use of right UE    Time 6    Period Weeks    Status On-going                 Plan - 03/27/20 1153    Clinical Impression Statement PROM for cervical added to attempt to increase cervical ROM.  Continued with decongestive techniques for head and neck to decrease swelling and strengthening exercises for Rt shoulder.    Personal Factors and Comorbidities Comorbidity 2    Comorbidities Modified radical neck dissection with spinal accessory nerve not preserved, post chemo and radiation    Examination-Activity Limitations Reach Overhead;Self Feeding;Dressing    Examination-Participation Restrictions Occupation;Driving;Other    Stability/Clinical Decision Making Stable/Uncomplicated    Rehab Potential Excellent    PT Frequency 3x / week    PT Duration 6 weeks    PT Treatment/Interventions ADLs/Self Care Home Management;Neuromuscular re-education;Passive range of motion;Manual techniques;Manual lymph drainage;Patient/family education;Therapeutic exercise;Therapeutic activities    PT Next Visit Plan Continue to improve postural strengthening, cervical and Rt shoulder ROM.   Continue with  manual for scar and induration reduction to improve cervical ROM    PT Home Exercise Plan Post op cervical exs; Cervical rot and SB with 5 sec hold, gentle UT stretches, cervical and scapular retraction:  10/20:  Shoulder wand exercises to improve ROM; 10/27: table slides for  flexion and abduction; 11/5: instructed to complete wand exercises sitting; 11/8 1-8 self manual techniques for chest lymphedema           Patient will benefit from skilled therapeutic intervention in order to improve the following deficits and impairments:  Increased fascial restricitons, Decreased mobility, Decreased scar mobility, Postural dysfunction, Decreased activity tolerance, Decreased range of motion, Decreased strength, Impaired UE functional use, Decreased knowledge of precautions, Increased edema  Visit Diagnosis: Neck stiffness  Muscle weakness (generalized)  Abnormal posture  Acute pain of right shoulder  Lymphedema, not elsewhere classified     Problem List Patient Active Problem List   Diagnosis Date Noted  . Nephrolithiasis 03/21/2020  . Family history of uterine cancer   . Family history of cancer   . Port-A-Cath in place 12/28/2019  . Hematuria 12/18/2019  . Counseling regarding advance care planning and goals of care 10/12/2019  . Malignant neoplasm of tonsillar fossa ([place]) 09/11/2019  . S/P tonsillectomy 08/21/2019  . Squamous cell carcinoma of head and neck ([place]) 08/10/2019  . Morbid obesity ([place]) 06/08/2019  . Moderate mixed hyperlipidemia not requiring statin therapy 06/08/2019  . OSA (obstructive sleep apnea) 06/08/2019  . Change in stool 02/22/2017  . Gout 02/22/2017    Rayetta Humphrey, PT CLT [phone] 03/27/2020, 12:18 PM  [place] [address] [place], [place], [address] Phone: [phone]   Fax:  [phone]  Name: Dustin Neumann Sr. MRN: [id number] Date of Birth: Feb 19, 1966

## 2020-03-27 NOTE — Progress Notes (Signed)
Subjective:  Patient ID: [name], male    DOB: [birth year]  Age: 55 y.o. MRN: [id number]  CC: Follow-up   HPI Yasir Kitner Sr. presents for follow-up of his chronic conditions.  He is a former patient of Particia Nearing.  This is his first office visit since she left the practice.  He has been under the care of Dr. Irene Limbo and Dr. Lanell Persons as well as Dr. Constance Holster of ENT for tonsillar cancer with local spread.  The neck dissection included removal of 30 lymph nodes apparently.  He is currently in remission.  He suffers from hematuria from use of chemotherapeutic agent cisplatin.  For this he has been out of work for a year.  Currently is in need of supplies for his CPAP machine.  He has obstructive sleep apnea.  He uses a setting of 13 cm.  He is also followed for gout.  He has had a few attacks recently well controlled with his Medicare.  He prefers to use that than going on a preventive that he would have to take it daily such as allopurinol.  Depression screen [facility] 2/9 03/27/2020 06/08/2019 04/03/2019  Decreased Interest 0 0 0  Down, Depressed, Hopeless 0 0 0  PHQ - 2 Score 0 0 0  Altered sleeping - - 0  Tired, decreased energy - - 0  Change in appetite - - 0  Feeling bad or failure about yourself  - - 0  Trouble concentrating - - 0  Moving slowly or fidgety/restless - - 0  Suicidal thoughts - - 0  PHQ-9 Score - - 0    History Keyontae has a past medical history of Arthritis, Change in stool (02/22/2017), Family history of cancer, Family history of uterine cancer, Gout (02/22/2017), Hyperlipidemia, Sleep apnea, and Tonsil cancer ([place]) (DX'd 07/2019).   He has a past surgical history that includes Carpal tunnel release (Right, 04/10/2019); Tonsillectomy (Bilateral, 08/21/2019); Direct laryngoscopy (N/A, 08/21/2019); Wisdom tooth extraction; Neck dissection (09/11/2019); Radical neck dissection (Right, 09/11/2019); IR IMAGING GUIDED PORT INSERTION (10/13/2019); and IR GASTROSTOMY TUBE MOD SED  (10/13/2019).   His family history includes COPD in his mother; Cancer in his cousin and other family members; Cervical cancer in his maternal aunt; Lung cancer in his maternal grandfather, maternal grandmother, and another family member; Lung cancer (age of onset: 20) in his brother; Pancreatic cancer (age of onset: 27) in his cousin; Prostate cancer in his paternal grandfather; Stomach cancer in his paternal grandmother; Stomach cancer (age of onset: 83) in his cousin; Uterine cancer (age of onset: 22) in his mother.He reports that he has never smoked. He has never used smokeless tobacco. He reports that he does not drink alcohol and does not use drugs.    ROS Review of Systems  Constitutional: Negative for fever.  Respiratory: Negative for shortness of breath.   Cardiovascular: Negative for chest pain.  Musculoskeletal: Negative for arthralgias.  Skin: Negative for rash.    Objective:  BP 126/85   Pulse (!) 108   Temp 98.8 F (37.1 C) (Temporal)   Ht 5\' 8"  (1.727 m)   Wt 250 lb 3.2 oz (113.5 kg)   BMI 38.04 kg/m   BP Readings from Last 3 Encounters:  03/27/20 126/85  03/21/20 136/90  03/20/20 124/69    Wt Readings from Last 3 Encounters:  03/27/20 250 lb 3.2 oz (113.5 kg)  03/21/20 253 lb (114.8 kg)  03/20/20 253 lb (114.8 kg)     Physical Exam Vitals reviewed.  Constitutional:      Appearance: He is well-developed.  HENT:     Head: Normocephalic and atraumatic.     Right Ear: External ear normal.     Left Ear: External ear normal.     Mouth/Throat:     Pharynx: No oropharyngeal exudate or posterior oropharyngeal erythema.  Eyes:     Pupils: Pupils are equal, round, and reactive to light.  Cardiovascular:     Rate and Rhythm: Normal rate and regular rhythm.     Heart sounds: No murmur heard.   Pulmonary:     Effort: No respiratory distress.     Breath sounds: Normal breath sounds.  Musculoskeletal:     Cervical back: Normal range of motion and neck supple.   Neurological:     Mental Status: He is alert and oriented to person, place, and time.       Assessment & Plan:   Algis was seen today for follow-up.  Diagnoses and all orders for this visit:  OSA (obstructive sleep apnea)  Squamous cell carcinoma of head and neck ([place])       I am having [name] maintain his loratadine, sodium fluoride, lidocaine-prilocaine, sodium fluoride, pantoprazole, feeding supplement (OSMOLITE 1.5 CAL), LORazepam, and dicyclomine.  Allergies as of 03/27/2020   No Known Allergies     Medication List       Accurate as of March 27, 2020 11:59 PM. If you have any questions, ask your nurse or doctor.        dicyclomine 20 MG tablet Commonly known as: BENTYL Take 1 tablet (20 mg total) by mouth 4 (four) times daily as needed for spasms (abdominal or intestinal spasms).   EQ Loratadine 10 MG tablet Generic drug: loratadine Take 10 mg by mouth daily.   feeding supplement (OSMOLITE 1.5 CAL) Liqd Begin Osmolite 1.5 via PEG.  Advance as tolerated to goal rate of 7 cartons daily in 4 feedings with 120 mL of free water before and after each bolus feeding.  In addition mix 30 mL Prosource with 60 mL of water 4 times daily and infuse into PEG with 60 mL free water after each Prosource administration.  Drink an additional 10 ounces of water daily or put into PEG.  If it provides 2885 cal, 144.3 g protein and 2887 mL free water/100% estimated needs.   lidocaine-prilocaine cream Commonly known as: EMLA   LORazepam 0.5 MG tablet Commonly known as: ATIVAN Take 1-2 tablets (0.5-1 mg total) by mouth every 8 (eight) hours as needed for anxiety or sleep (chemotherapy related nausea).   pantoprazole 40 MG tablet Commonly known as: Protonix Take 1 tablet (40 mg total) by mouth daily before breakfast.   sodium fluoride 1.1 % Crea dental cream Commonly known as: PreviDent 5000 Plus Apply to tooth brush or in fluoride trays as directed.  Repeat  nightly. Spit out excess-DO NOT swallow. DO NOT rinse afterwards.   sodium fluoride 1.1 % Gel dental gel Commonly known as: FLUORISHIELD APPLY TO TOOTHBRUSH OR IN FLUORIDE TRAYS AS DIRECTED. REPEAT NIGHTLY. SPIT OUT EXCESS DO NOT SWALLOW. DO NOT RINSE AFTERWARDS        Follow-up: Return in about 6 months (around 09/24/2020).  Claretta Fraise, M.D.

## 2020-03-29 ENCOUNTER — Ambulatory Visit (HOSPITAL_COMMUNITY): Payer: 59 | Admitting: Physical Therapy

## 2020-03-29 ENCOUNTER — Other Ambulatory Visit: Payer: Self-pay

## 2020-03-29 DIAGNOSIS — M6281 Muscle weakness (generalized): Secondary | ICD-10-CM

## 2020-03-29 DIAGNOSIS — R293 Abnormal posture: Secondary | ICD-10-CM

## 2020-03-29 DIAGNOSIS — M436 Torticollis: Secondary | ICD-10-CM

## 2020-03-29 NOTE — Therapy (Signed)
[place], [place], [address] Phone: [phone]   Fax:  [phone]  Physical Therapy Treatment  Patient Details  Name: Dustin Pratte Sr. MRN: [id number] Date of Birth: [birth year] Referring Provider (PT): Dr. Irene Limbo   Encounter Date: 03/29/2020   PT End of Session - 03/29/20 1227    Visit Number 12    Number of Visits 18    Date for PT Re-Evaluation 04/08/20    Progress Note Due on Visit 18    PT Start Time 1130    PT Stop Time 3875    PT Time Calculation (min) 50 min    Activity Tolerance Patient tolerated treatment well    Behavior During Therapy [facility] for tasks assessed/performed           Past Medical History:  Diagnosis Date   Arthritis    Change in stool 02/22/2017   Family history of cancer    Family history of uterine cancer    Gout 02/22/2017   Hyperlipidemia    Sleep apnea    Uses CPAP   Tonsil cancer ([place]) DX'd 07/2019   metastatic    Past Surgical History:  Procedure Laterality Date   CARPAL TUNNEL RELEASE Right 04/10/2019   DIRECT LARYNGOSCOPY N/A 08/21/2019   Procedure: DIRECT LARYNGOSCOPY - WITH BX;  Surgeon: [name], MD;  Location: [facility];  Service: ENT;  Laterality: N/A;   IR GASTROSTOMY TUBE MOD SED  10/13/2019   IR IMAGING GUIDED PORT INSERTION  10/13/2019   NECK DISSECTION  09/11/2019   RADICAL NECK DISSECTION Right 09/11/2019   Procedure: RADICAL NECK DISSECTION;  Surgeon: [name], MD;  Location: [place];  Service: ENT;  Laterality: Right;   TONSILLECTOMY Bilateral 08/21/2019   Procedure: TONSILLECTOMY;  Surgeon: [name], MD;  Location: [facility];  Service: ENT;  Laterality: Bilateral;   WISDOM TOOTH EXTRACTION      There were no vitals filed for this visit.   Subjective Assessment - 03/29/20 1135    Subjective PT has no complaints    Pertinent History Rt tonsil cancer post tonsillectomy and adnoidectomy 08/21/19 with  scheduled neck dissection on 09/11/19.  Radiation and chemotherapy completed July 22,2021  Still has a feeding tube because he has no appetite.    Patient Stated Goals Improve neck motion so he can see to back up when driving,, improve Right shoulder ROM for reaching    Currently in Pain? No/denies                 [facility] Adult PT Treatment/Exercise - 03/29/20 0001      Exercises   Exercises Shoulder      Neck Exercises: Standing   Thumb Tacks x 20    Other Standing Exercises wall walk (made to 16th hole) /UE flexion 10X10" facing wall (able to arch Lt UE not Rt)       Neck Exercises: Seated   Shoulder Flexion Both;10 reps    Shoulder Flexion Limitations arms on table push back with stool     Shoulder ABduction Right;10 reps    Other Seated Exercise self mob ER x 10     Other Seated Exercise ball around the chair clockwise/ counter       Neck Exercises: Supine   Other Supine Exercise PROM for all       Manual Therapy   Manual Therapy Joint mobilization;Manual Lymphatic Drainage (MLD);Passive ROM    Manual therapy comments completed seperately  from all other skilled interventions at [facility] to improve cervical ROM     Soft tissue mobilization to improve ROM    Manual Lymphatic Drainage (MLD) to decrease cervical and facial swelling                        PT Long Term Goals - 03/25/20 1223      PT LONG TERM GOAL #1   Title Pt will have active shoulder flexion and scaption atleast 130 degrees for improved reaching ability    Time 6    Period Weeks    Status On-going      PT LONG TERM GOAL #2   Title Pt will note improved ability to look over shoulder to back up while driving without turning his entire body    Time 6    Period Weeks    Status On-going      PT LONG TERM GOAL #3   Title pt will demonstrate ability to open mouth to 25 mm or greater for improved ability to eat    Time 6    Period Weeks    Status Achieved      PT LONG TERM  GOAL #4   Title Right shoulder strength atleast 4-4+/5 for improved use of right UE    Time 6    Period Weeks    Status On-going                 Plan - 03/29/20 1227    Clinical Impression Statement Added jt self mobs for shoulder as well as thumb tacks to improve stability; these were given as a HEP.  PT continues to have some swelling in cervical area but minimal in facial at this time.    Personal Factors and Comorbidities Comorbidity 2    Comorbidities Modified radical neck dissection with spinal accessory nerve not preserved, post chemo and radiation    Examination-Activity Limitations Reach Overhead;Self Feeding;Dressing    Examination-Participation Restrictions Occupation;Driving;Other    Stability/Clinical Decision Making Stable/Uncomplicated    Rehab Potential Excellent    PT Frequency 3x / week    PT Duration 6 weeks    PT Treatment/Interventions ADLs/Self Care Home Management;Neuromuscular re-education;Passive range of motion;Manual techniques;Manual lymph drainage;Patient/family education;Therapeutic exercise;Therapeutic activities    PT Next Visit Plan Continue to improve postural strengthening, cervical and Rt shoulder ROM.  Continue with  manual for scar and induration reduction to improve cervical ROM    PT Home Exercise Plan Post op cervical exs; Cervical rot and SB with 5 sec hold, gentle UT stretches, cervical and scapular retraction:  10/20:  Shoulder wand exercises to improve ROM; 10/27: table slides for flexion and abduction; 11/5: instructed to complete wand exercises sitting; 11/8 1-8 self manual techniques for chest lymphedema11/19: shoulder self jt mobs for ant,inferior and posterior capsule.           Patient will benefit from skilled therapeutic intervention in order to improve the following deficits and impairments:  Increased fascial restricitons, Decreased mobility, Decreased scar mobility, Postural dysfunction, Decreased activity tolerance, Decreased  range of motion, Decreased strength, Impaired UE functional use, Decreased knowledge of precautions, Increased edema  Visit Diagnosis: Neck stiffness  Muscle weakness (generalized)  Abnormal posture     Problem List Patient Active Problem List   Diagnosis Date Noted   Nephrolithiasis 03/21/2020   Family history of uterine cancer    Family history of cancer    Port-A-Cath in place  12/28/2019   Hematuria 12/18/2019   Counseling regarding advance care planning and goals of care 10/12/2019   Malignant neoplasm of tonsillar fossa ([place]) 09/11/2019   S/P tonsillectomy 08/21/2019   Squamous cell carcinoma of head and neck ([place]) 08/10/2019   Morbid obesity ([place]) 06/08/2019   Moderate mixed hyperlipidemia not requiring statin therapy 06/08/2019   OSA (obstructive sleep apnea) 06/08/2019   Change in stool 02/22/2017   Gout 02/22/2017  Rayetta Humphrey, PT CLT [phone] 03/29/2020, 12:30 PM  [place], [place], [address] Phone: [phone]   Fax:  [phone]  Name: Dustin Schellinger Sr. MRN: [id number] Date of Birth: [birth year]

## 2020-03-31 ENCOUNTER — Encounter: Payer: Self-pay | Admitting: Family Medicine

## 2020-04-01 ENCOUNTER — Other Ambulatory Visit: Payer: Self-pay

## 2020-04-01 ENCOUNTER — Encounter (HOSPITAL_COMMUNITY): Payer: Self-pay | Admitting: Physical Therapy

## 2020-04-01 ENCOUNTER — Ambulatory Visit (HOSPITAL_COMMUNITY): Payer: 59 | Admitting: Physical Therapy

## 2020-04-01 DIAGNOSIS — R293 Abnormal posture: Secondary | ICD-10-CM | POA: Diagnosis not present

## 2020-04-01 DIAGNOSIS — M436 Torticollis: Secondary | ICD-10-CM

## 2020-04-01 DIAGNOSIS — M25612 Stiffness of left shoulder, not elsewhere classified: Secondary | ICD-10-CM

## 2020-04-01 DIAGNOSIS — M6281 Muscle weakness (generalized): Secondary | ICD-10-CM

## 2020-04-01 DIAGNOSIS — I890 Lymphedema, not elsewhere classified: Secondary | ICD-10-CM

## 2020-04-01 NOTE — Therapy (Signed)
[place], [place], [address] Phone: [phone]   Fax:  [phone]  Physical Therapy Treatment  Patient Details  Name: Dustin Lafontaine Sr. MRN: [id number] Date of Birth: [birth year] Referring Provider (PT): Dr. Irene Limbo   Encounter Date: 04/01/2020   PT End of Session - 04/01/20 0924    Visit Number 13    Number of Visits 18    Date for PT Re-Evaluation 04/08/20    Progress Note Due on Visit 18    PT Start Time 0917    PT Stop Time 0955    PT Time Calculation (min) 38 min    Activity Tolerance Patient tolerated treatment well    Behavior During Therapy [facility] for tasks assessed/performed           Past Medical History:  Diagnosis Date  . Arthritis   . Change in stool 02/22/2017  . Family history of cancer   . Family history of uterine cancer   . Gout 02/22/2017  . Hyperlipidemia   . Sleep apnea    Uses CPAP  . Tonsil cancer [facility]) DX'd 07/2019   metastatic    Past Surgical History:  Procedure Laterality Date  . CARPAL TUNNEL RELEASE Right 04/10/2019  . DIRECT LARYNGOSCOPY N/A 08/21/2019   Procedure: DIRECT LARYNGOSCOPY - WITH BX;  Surgeon: [name], MD;  Location: [place];  Service: ENT;  Laterality: N/A;  . IR GASTROSTOMY TUBE MOD SED  10/13/2019  . IR IMAGING GUIDED PORT INSERTION  10/13/2019  . NECK DISSECTION  09/11/2019  . RADICAL NECK DISSECTION Right 09/11/2019   Procedure: RADICAL NECK DISSECTION;  Surgeon: [name], MD;  Location: [place];  Service: ENT;  Laterality: Right;  . TONSILLECTOMY Bilateral 08/21/2019   Procedure: TONSILLECTOMY;  Surgeon: [name], MD;  Location: [place];  Service: ENT;  Laterality: Bilateral;  . WISDOM TOOTH EXTRACTION      There were no vitals filed for this visit.   Subjective Assessment - 04/01/20 0919    Subjective Pt states that he has not done his  new exercises, states that he needs to leave about five minutes early due  to another appointment.    Pertinent History Rt tonsil cancer post tonsillectomy and adnoidectomy 08/21/19 with scheduled neck dissection on 09/11/19.  Radiation and chemotherapy completed July 22,2021  Still has a feeding tube because he has no appetite.    Patient Stated Goals Improve neck motion so he can see to back up when driving,, improve Right shoulder ROM for reaching    Currently in Pain? No/denies                             [facility]Exercise - 04/01/20 0001      Exercises   Exercises Shoulder      Neck Exercises: Standing   Upper Extremity Flexion with Stabilization Flexion;10 reps    Thumb Tacks x 20    Other Standing Exercises abduction x 10      Neck Exercises: Seated   Shoulder Flexion 10 reps    Shoulder Flexion Limitations arms on table push back with stool     Shoulder ABduction Right;10 reps    Other Seated Exercise self mob ER x 10     Other Seated Exercise ball around the chair clockwise/ counter       Neck Exercises: Supine   Other Supine Exercise PROM for  all       Shoulder Exercises: Standing   Horizontal ABduction AROM;Both;10 reps    External Rotation AAROM;Both;10 reps   for full ROM   Flexion AAROM;Both;10 reps    ABduction AAROM;Both;10 reps    Other Standing Exercises thumb tacks x 1 min       Shoulder Exercises: Therapy Ball   Other Therapy Ball Exercises around the world       Manual Therapy   Manual Therapy Soft tissue mobilization;Passive ROM    Manual therapy comments completed seperately from all other skilled interventions at [facility]    Joint Mobilization --    Soft tissue mobilization to improve ROM    Manual Lymphatic Drainage (MLD) to decrease cervical and facial swelling                        PT Long Term Goals - 03/25/20 1223      PT LONG TERM GOAL #1   Title Pt will have active shoulder flexion and scaption atleast 130 degrees for improved reaching ability    Time 6    Period Weeks     Status On-going      PT LONG TERM GOAL #2   Title Pt will note improved ability to look over shoulder to back up while driving without turning his entire body    Time 6    Period Weeks    Status On-going      PT LONG TERM GOAL #3   Title pt will demonstrate ability to open mouth to 25 mm or greater for improved ability to eat    Time 6    Period Weeks    Status Achieved      PT LONG TERM GOAL #4   Title Right shoulder strength atleast 4-4+/5 for improved use of right UE    Time 6    Period Weeks    Status On-going                 Plan - 04/01/20 0925    Clinical Impression Statement Reviewed jt self mob as pt did not complete over the weekend.  PT demonstrating improved ROM both in cervical as well as shoulder ROM.  Instructed pt in self contract relax for cervical ROM    Personal Factors and Comorbidities Comorbidity 2    Comorbidities Modified radical neck dissection with spinal accessory nerve not preserved, post chemo and radiation    Examination-Activity Limitations Reach Overhead;Self Feeding;Dressing    Examination-Participation Restrictions Occupation;Driving;Other    Stability/Clinical Decision Making Stable/Uncomplicated    Rehab Potential Excellent    PT Frequency 3x / week    PT Duration 6 weeks    PT Treatment/Interventions ADLs/Self Care Home Management;Neuromuscular re-education;Passive range of motion;Manual techniques;Manual lymph drainage;Patient/family education;Therapeutic exercise;Therapeutic activities    PT Next Visit Plan Continue to improve postural strengthening, cervical and Rt shoulder ROM.  Continue with  manual for scar and induration reduction to improve cervical ROM    PT Home Exercise Plan Post op cervical exs; Cervical rot and SB with 5 sec hold, gentle UT stretches, cervical and scapular retraction:  10/20:  Shoulder wand exercises to improve ROM; 10/27: table slides for flexion and abduction; 11/5: instructed to complete wand exercises  sitting; 11/8 1-8 self manual techniques for chest lymphedema11/19: shoulder self jt mobs for ant,inferior and posterior capsule.           Patient will benefit from skilled therapeutic intervention in order to  improve the following deficits and impairments:  Increased fascial restricitons, Decreased mobility, Decreased scar mobility, Postural dysfunction, Decreased activity tolerance, Decreased range of motion, Decreased strength, Impaired UE functional use, Decreased knowledge of precautions, Increased edema  Visit Diagnosis: Neck stiffness  Muscle weakness (generalized)  Lymphedema, not elsewhere classified  Stiffness of left shoulder, not elsewhere classified     Problem List Patient Active Problem List   Diagnosis Date Noted  . Nephrolithiasis 03/21/2020  . Family history of uterine cancer   . Family history of cancer   . Port-A-Cath in place 12/28/2019  . Hematuria 12/18/2019  . Counseling regarding advance care planning and goals of care 10/12/2019  . Malignant neoplasm of tonsillar fossa ([place]) 09/11/2019  . S/P tonsillectomy 08/21/2019  . Squamous cell carcinoma of head and neck ([place]) 08/10/2019  . Morbid obesity ([place]) 06/08/2019  . Moderate mixed hyperlipidemia not requiring statin therapy 06/08/2019  . OSA (obstructive sleep apnea) 06/08/2019  . Change in stool 02/22/2017  . Gout 02/22/2017  Rayetta Humphrey, PT CLT [phone] 04/01/2020, 9:57 AM  [place] [address] [place], [place], [address] Phone: [phone]   Fax:  [phone]  Name: Dustin Bordner Sr. MRN: [id number] Date of Birth: 1966/04/25

## 2020-04-02 ENCOUNTER — Telehealth: Payer: Self-pay | Admitting: Hematology

## 2020-04-02 NOTE — Telephone Encounter (Signed)
Release: [id number] Faxed last ov note to [employer] RN case Manager Toney Rakes [place] fax# [phone]

## 2020-04-03 ENCOUNTER — Other Ambulatory Visit: Payer: Self-pay

## 2020-04-03 ENCOUNTER — Ambulatory Visit (HOSPITAL_COMMUNITY): Payer: 59 | Admitting: Physical Therapy

## 2020-04-03 DIAGNOSIS — M25612 Stiffness of left shoulder, not elsewhere classified: Secondary | ICD-10-CM

## 2020-04-03 DIAGNOSIS — I890 Lymphedema, not elsewhere classified: Secondary | ICD-10-CM

## 2020-04-03 DIAGNOSIS — M6281 Muscle weakness (generalized): Secondary | ICD-10-CM

## 2020-04-03 DIAGNOSIS — R293 Abnormal posture: Secondary | ICD-10-CM | POA: Diagnosis not present

## 2020-04-03 DIAGNOSIS — M436 Torticollis: Secondary | ICD-10-CM

## 2020-04-03 NOTE — Therapy (Signed)
[place], [place], [address] Phone: [phone]   Fax:  [phone]  Physical Therapy Treatment  Patient Details  Name: Dustin Zingaro Sr. MRN: [id number] Date of Birth: [birth year] Referring Provider (PT): Dr. Irene Limbo   Encounter Date: 04/03/2020   PT End of Session - 04/03/20 1132    Visit Number 14    Number of Visits 18    Date for PT Re-Evaluation 04/08/20    Progress Note Due on Visit 18    PT Start Time 1050    PT Stop Time 1130    PT Time Calculation (min) 40 min    Activity Tolerance Patient tolerated treatment well    Behavior During Therapy [facility] for tasks assessed/performed           Past Medical History:  Diagnosis Date  . Arthritis   . Change in stool 02/22/2017  . Family history of cancer   . Family history of uterine cancer   . Gout 02/22/2017  . Hyperlipidemia   . Sleep apnea    Uses CPAP  . Tonsil cancer [facility]) DX'd 07/2019   metastatic    Past Surgical History:  Procedure Laterality Date  . CARPAL TUNNEL RELEASE Right 04/10/2019  . DIRECT LARYNGOSCOPY N/A 08/21/2019   Procedure: DIRECT LARYNGOSCOPY - WITH BX;  Surgeon: [name], MD;  Location: [place];  Service: ENT;  Laterality: N/A;  . IR GASTROSTOMY TUBE MOD SED  10/13/2019  . IR IMAGING GUIDED PORT INSERTION  10/13/2019  . NECK DISSECTION  09/11/2019  . RADICAL NECK DISSECTION Right 09/11/2019   Procedure: RADICAL NECK DISSECTION;  Surgeon: [name], MD;  Location: [place];  Service: ENT;  Laterality: Right;  . TONSILLECTOMY Bilateral 08/21/2019   Procedure: TONSILLECTOMY;  Surgeon: [name], MD;  Location: [place];  Service: ENT;  Laterality: Bilateral;  . WISDOM TOOTH EXTRACTION      There were no vitals filed for this visit.   Subjective Assessment - 04/03/20 1049    Subjective Pt has no pain, he is doing well.    Pertinent History Rt tonsil cancer post tonsillectomy and adnoidectomy  08/21/19 with scheduled neck dissection on 09/11/19.  Radiation and chemotherapy completed July 22,2021  Still has a feeding tube because he has no appetite.    Patient Stated Goals Improve neck motion so he can see to back up when driving,, improve Right shoulder ROM for reaching                     [facility] Adult PT Treatment/Exercise - 04/03/20 0001      Exercises   Exercises Shoulder      Neck Exercises: Standing   Wall Push Ups 10 reps    Upper Extremity Flexion with Stabilization --    Thumb Tacks x 20    Other Standing Exercises --      Neck Exercises: Seated   Shoulder Flexion 10 reps    Shoulder Flexion Weights (lbs) AA    Shoulder Flexion Limitations --    Shoulder ABduction Right;10 reps    Shoulder Abduction Limitations AA     Other Seated Exercise ER/IR with arm a 90 degree, horizontal ab/adduction    Other Seated Exercise lat pull down 15 reps 3 polates,ball around the chair clockwise/ counter       Neck Exercises: Supine   Neck Retraction 5 reps    Other Supine Exercise PROM for all  Neck Exercises: Sidelying   Lateral Flexion Both;10 reps      Shoulder Exercises: Seated   Horizontal ABduction Limitations 10      Shoulder Exercises: Standing   Horizontal ABduction AROM;Both;10 reps    External Rotation AAROM;Both;10 reps   for full ROM   Flexion AAROM;Both;10 reps    ABduction AAROM;Both;10 reps    Other Standing Exercises thumb tacks x 1 min     Other Standing Exercises PNF x 10 1 and 2       Shoulder Exercises: Therapy Ball   Other Therapy Ball Exercises --      Manual Therapy   Manual Therapy Joint mobilization;Soft tissue mobilization;Passive ROM    Manual therapy comments completed seperately from all other skilled interventions at [facility]    Joint Mobilization to improve cervical ROM     Soft tissue mobilization to improve ROM    Manual Lymphatic Drainage (MLD) --                       PT Long Term Goals - 03/25/20 1223       PT LONG TERM GOAL #1   Title Pt will have active shoulder flexion and scaption atleast 130 degrees for improved reaching ability    Time 6    Period Weeks    Status On-going      PT LONG TERM GOAL #2   Title Pt will note improved ability to look over shoulder to back up while driving without turning his entire body    Time 6    Period Weeks    Status On-going      PT LONG TERM GOAL #3   Title pt will demonstrate ability to open mouth to 25 mm or greater for improved ability to eat    Time 6    Period Weeks    Status Achieved      PT LONG TERM GOAL #4   Title Right shoulder strength atleast 4-4+/5 for improved use of right UE    Time 6    Period Weeks    Status On-going                 Plan - 04/03/20 1136    Clinical Impression Statement Therapist added wall pushup to improve stabilty of shoulders as well as sidelying sidebend x 10 B , pt instructed to continue contract relax to improve cervical rotation at home .    Personal Factors and Comorbidities Comorbidity 2    Comorbidities Modified radical neck dissection with spinal accessory nerve not preserved, post chemo and radiation    Examination-Activity Limitations Reach Overhead;Self Feeding;Dressing    Examination-Participation Restrictions Occupation;Driving;Other    Stability/Clinical Decision Making Stable/Uncomplicated    Rehab Potential Excellent    PT Frequency 3x / week    PT Duration 6 weeks    PT Treatment/Interventions ADLs/Self Care Home Management;Neuromuscular re-education;Passive range of motion;Manual techniques;Manual lymph drainage;Patient/family education;Therapeutic exercise;Therapeutic activities    PT Next Visit Plan Continue to improve postural strengthening, cervical and Rt shoulder ROM.  Continue with  manual for scar and induration reduction to improve cervical ROM    PT Home Exercise Plan Post op cervical exs; Cervical rot and SB with 5 sec hold, gentle UT stretches, cervical and scapular  retraction:  10/20:  Shoulder wand exercises to improve ROM; 10/27: table slides for flexion and abduction; 11/5: instructed to complete wand exercises sitting; 11/8 1-8 self manual techniques for chest lymphedema11/19: shoulder self jt  mobs for ant,inferior and posterior capsule.           Patient will benefit from skilled therapeutic intervention in order to improve the following deficits and impairments:  Increased fascial restricitons, Decreased mobility, Decreased scar mobility, Postural dysfunction, Decreased activity tolerance, Decreased range of motion, Decreased strength, Impaired UE functional use, Decreased knowledge of precautions, Increased edema  Visit Diagnosis: Neck stiffness  Muscle weakness (generalized)  Lymphedema, not elsewhere classified  Stiffness of left shoulder, not elsewhere classified     Problem List Patient Active Problem List   Diagnosis Date Noted  . Nephrolithiasis 03/21/2020  . Family history of uterine cancer   . Family history of cancer   . Port-A-Cath in place 12/28/2019  . Hematuria 12/18/2019  . Counseling regarding advance care planning and goals of care 10/12/2019  . Malignant neoplasm of tonsillar fossa ([place]) 09/11/2019  . S/P tonsillectomy 08/21/2019  . Squamous cell carcinoma of head and neck ([place]) 08/10/2019  . Morbid obesity ([place]) 06/08/2019  . Moderate mixed hyperlipidemia not requiring statin therapy 06/08/2019  . OSA (obstructive sleep apnea) 06/08/2019  . Change in stool 02/22/2017  . Gout 02/22/2017   Rayetta Humphrey, PT CLT [phone] 04/03/2020, 11:38 AM  [facility] [address] [place], [place], [address] Phone: [phone]   Fax:  [phone]  Name: Dustin Cayer Sr. MRN: [id number] Date of Birth: [birth year]

## 2020-04-09 ENCOUNTER — Inpatient Hospital Stay: Payer: 59 | Admitting: Nutrition

## 2020-04-09 ENCOUNTER — Telehealth: Payer: Self-pay

## 2020-04-09 ENCOUNTER — Other Ambulatory Visit: Payer: Self-pay

## 2020-04-09 ENCOUNTER — Ambulatory Visit (HOSPITAL_COMMUNITY): Payer: 59 | Admitting: Physical Therapy

## 2020-04-09 ENCOUNTER — Inpatient Hospital Stay: Payer: 59

## 2020-04-09 DIAGNOSIS — M6281 Muscle weakness (generalized): Secondary | ICD-10-CM

## 2020-04-09 DIAGNOSIS — I890 Lymphedema, not elsewhere classified: Secondary | ICD-10-CM

## 2020-04-09 DIAGNOSIS — G4733 Obstructive sleep apnea (adult) (pediatric): Secondary | ICD-10-CM

## 2020-04-09 DIAGNOSIS — R293 Abnormal posture: Secondary | ICD-10-CM

## 2020-04-09 DIAGNOSIS — M25511 Pain in right shoulder: Secondary | ICD-10-CM

## 2020-04-09 DIAGNOSIS — M436 Torticollis: Secondary | ICD-10-CM

## 2020-04-09 NOTE — Therapy (Addendum)
[place], [place], [address] Phone: [phone]   Fax:  [phone]  Physical Therapy Treatment  Patient Details  Name: Dustin Pagliarulo Sr. MRN: [id number] Date of Birth: 07/24/1965 Referring Provider (PT): Dr. Irene Limbo  Progress Note Reporting Period 03/25/2020 to 04/09/2020  See note below for Objective Data and Assessment of Progress/Goals.       Encounter Date: 04/09/2020   PT End of Session - 04/09/20 1313    Visit Number 15    Number of Visits 21    Date for PT Re-Evaluation 04/30/20    Progress Note Due on Visit 21    PT Start Time 1315    PT Stop Time 1400    PT Time Calculation (min) 45 min    Activity Tolerance Patient tolerated treatment well    Behavior During Therapy [facility] for tasks assessed/performed           Past Medical History:  Diagnosis Date  . Arthritis   . Change in stool 02/22/2017  . Family history of cancer   . Family history of uterine cancer   . Gout 02/22/2017  . Hyperlipidemia   . Sleep apnea    Uses CPAP  . Tonsil cancer [facility]) DX'd 07/2019   metastatic    Past Surgical History:  Procedure Laterality Date  . CARPAL TUNNEL RELEASE Right 04/10/2019  . DIRECT LARYNGOSCOPY N/A 08/21/2019   Procedure: DIRECT LARYNGOSCOPY - WITH BX;  Surgeon: [name], MD;  Location: [facility];  Service: ENT;  Laterality: N/A;  . IR GASTROSTOMY TUBE MOD SED  10/13/2019  . IR IMAGING GUIDED PORT INSERTION  10/13/2019  . NECK DISSECTION  09/11/2019  . RADICAL NECK DISSECTION Right 09/11/2019   Procedure: RADICAL NECK DISSECTION;  Surgeon: [name], MD;  Location: [place];  Service: ENT;  Laterality: Right;  . TONSILLECTOMY Bilateral 08/21/2019   Procedure: TONSILLECTOMY;  Surgeon: [name], MD;  Location: [place];  Service: ENT;  Laterality: Bilateral;  . WISDOM TOOTH EXTRACTION      There were no vitals filed for this visit.   Subjective Assessment -  04/09/20 1401    Subjective Pt states he tried to do a little something but he was pretty busy over Thanksgiving    Pertinent History Rt tonsil cancer post tonsillectomy and adnoidectomy 08/21/19 with scheduled neck dissection on 09/11/19.  Radiation and chemotherapy completed July 22,2021  Still has a feeding tube because he has no appetite.    Patient Stated Goals Improve neck motion so he can see to back up when driving,, improve Right shoulder ROM for reaching    Currently in Pain? No/denies              [facility] PT Assessment - 04/09/20 0001      Assessment   Referring Provider (PT) Dr. Irene Limbo    Onset Date/Surgical Date 08/23/19    Hand Dominance Right;Left    Prior Therapy swallowing      Precautions   Precaution Comments lymphedema      Restrictions   Weight Bearing Restrictions No      [place] residence    Living Arrangements Spouse/significant other    Available Help at Discharge Family    Additional Comments live in [place] On disability   industrial control work     Associate Professor  Overall Cognitive Status Within Functional Limits for tasks assessed      Observation/Other Assessments   Observations incision healed    Skin Integrity skin darkening from radiation      Posture/Postural Control   Posture/Postural Control Postural limitations    Postural Limitations Rounded Shoulders;Forward head;Increased thoracic kyphosis      AROM   Right Shoulder Extension 32 Degrees   was 32 degrees   Right Shoulder Flexion 160 Degrees   sitting is120    was 100   Right Shoulder ABduction 165 Degrees   sitting 90 was 75   Right Shoulder Internal Rotation 70 Degrees   75 was 70   Right Shoulder External Rotation 85 Degrees   sitting 75 was 75    Left Shoulder Extension 54 Degrees    Left Shoulder Flexion 150 Degrees   was 150   Left Shoulder ABduction 150 Degrees   was 150    Left Shoulder Internal Rotation 65 Degrees   was 65   Left Shoulder External Rotation 90 Degrees   was 90   Cervical Flexion 55   was 40 degrees   Cervical Extension 30   was 35 degrees   Cervical - Right Side Bend 38   was 38   Cervical - Left Side Bend 40   was 36   Cervical - Right Rotation 60   was 39   Cervical - Left Rotation 55   was 40                        [facility] Adult PT Treatment/Exercise - 04/09/20 0001      Shoulder Exercises: Seated   Horizontal ABduction Both;5 reps    Horizontal ABduction Limitations 10    External Rotation 10 reps    External Rotation Weight (lbs) 3    Flexion AAROM;5 reps    Abduction AAROM;Right;5 reps    Diagonals Limitations PNF 1 and 2       Shoulder Exercises: Standing   Other Standing Exercises thumb tacks x 1 min     Other Standing Exercises wall push up x 10                        PT Long Term Goals - 04/09/20 1336      PT LONG TERM GOAL #1   Title Pt will have active shoulder flexion and scaption atleast 130 degrees for improved reaching ability    Time 6    Period Weeks    Status Partially Met      PT LONG TERM GOAL #2   Title Pt will note improved ability to look over shoulder to back up while driving without turning his entire body    Time 6    Period Weeks    Status Partially Met      PT LONG TERM GOAL #3   Title pt will demonstrate ability to open mouth to 25 mm or greater for improved ability to eat    Time 6    Period Weeks    Status Achieved      PT LONG TERM GOAL #4   Title Right shoulder strength atleast 4-4+/5 for improved use of right UE    Time 6    Period Weeks    Status Not Met                 Plan - 04/09/20 1314    Clinical Impression Statement   Pt reassessed with noted improvements since visit 10.  Pt still has decreased ROM,decreased strength, decreased activity tolerance  and endurance of Rt shoulder and decreased cervical ROM.  Pt will continue to benefit from  skilled PT to improve his functional abiility.    Personal Factors and Comorbidities Comorbidity 2    Comorbidities Modified radical neck dissection with spinal accessory nerve not preserved, post chemo and radiation    Examination-Activity Limitations Reach Overhead;Self Feeding;Dressing    Examination-Participation Restrictions Occupation;Driving;Other    Stability/Clinical Decision Making Stable/Uncomplicated    Rehab Potential Excellent    PT Frequency 3x / week    PT Duration 6 weeks    PT Treatment/Interventions ADLs/Self Care Home Management;Neuromuscular re-education;Passive range of motion;Manual techniques;Manual lymph drainage;Patient/family education;Therapeutic exercise;Therapeutic activities    PT Next Visit Plan Continue to improve postural strengthening, cervical and Rt shoulder ROM and strength.  Continue with  manual for scar and induration reduction to improve cervical ROM    PT Home Exercise Plan Post op cervical exs; Cervical rot and SB with 5 sec hold, gentle UT stretches, cervical and scapular retraction:  10/20:  Shoulder wand exercises to improve ROM; 10/27: table slides for flexion and abduction; 11/5: instructed to complete wand exercises sitting; 11/8 1-8 self manual techniques for chest lymphedema11/19: shoulder self jt mobs for ant,inferior and posterior capsule.           Patient will benefit from skilled therapeutic intervention in order to improve the following deficits and impairments:  Increased fascial restricitons, Decreased mobility, Decreased scar mobility, Postural dysfunction, Decreased activity tolerance, Decreased range of motion, Decreased strength, Impaired UE functional use, Decreased knowledge of precautions, Increased edema  Visit Diagnosis: Neck stiffness  Muscle weakness (generalized)  Lymphedema, not elsewhere classified  Abnormal posture  Acute pain of right shoulder     Problem List Patient Active Problem List   Diagnosis Date  Noted  . Nephrolithiasis 03/21/2020  . Family history of uterine cancer   . Family history of cancer   . Port-A-Cath in place 12/28/2019  . Hematuria 12/18/2019  . Counseling regarding advance care planning and goals of care 10/12/2019  . Malignant neoplasm of tonsillar fossa ([place]) 09/11/2019  . S/P tonsillectomy 08/21/2019  . Squamous cell carcinoma of head and neck ([place]) 08/10/2019  . Morbid obesity ([place]) 06/08/2019  . Moderate mixed hyperlipidemia not requiring statin therapy 06/08/2019  . OSA (obstructive sleep apnea) 06/08/2019  . Change in stool 02/22/2017  . Gout 02/22/2017   Rayetta Humphrey, PT CLT [phone] 04/09/2020, 2:01 PM  [facility] [address] [place], [place], [address] Phone: [phone]   Fax:  [phone]  Name: Dustin Schertzer Sr. MRN: [id number] Date of Birth: October 18, 1965

## 2020-04-09 NOTE — Telephone Encounter (Signed)
  Prescription Request  04/09/2020  What is the name of the medication or equipment? Cpap Supplies  Have you contacted your pharmacy to request a refill? (if applicable) yes  Which pharmacy would you like this sent to? [facility] in [place]   Patient notified that their request is being sent to the clinical staff for review and that they should receive a response within 2 business days.   Stacks' pt.  He has been waiting for 2 weeks now.  Please call pt.

## 2020-04-09 NOTE — Progress Notes (Signed)
Nutrition Follow-up:  Patient has completed chemotherapy and radiation therapy.   Met with patient in clinic today.  Patient reports that he has not used feeding tube in 5-6 weeks. Continues to flush it daily.  Reports oral intake is slowly coming back. Was hoping to be able to eat more at Thanksgiving than was able too.  Reports taste of things like ketchup, barbecue sauce, spaghetti sauce is still off.  Dry mouth is still a concern.  Drinks liquids consistently.  Breakfast is usually oatmeal and 1/2 bowl cereal.  Also has eaten few muffins soaked in milk.  Yesterday ate 1/2 of [place] sandwich with mayo. Does not have much of an appetite.  Drinks 2-3 boost/ensure shakes daily.  Also drinks whole milk during the day as well.      Medications: reviewed  Labs: reviewed  Anthropometrics:   Weight 252 lb today in RD clinic.    11/17 250 lb 257 lb 6 oz on 10/26   NUTRITION DIAGNOSIS: Food and nutrition related knowledged deficit improved   INTERVENTION:  Patient to continue to flush feeding tube daily until able to remove tube.   Patient to continue to moisten foods and consume high calorie, high protein foods to maintain weight Continue oral nutrition supplements as able for additional calories and protein. Coupons given. Patient has RD contact information   NEXT VISIT: no follow-up Patient to call RD if something changes in nutritional status.  Kaye Luoma B. Zenia Resides, [place], [place] Registered Dietitian [phone] (mobile)

## 2020-04-10 ENCOUNTER — Ambulatory Visit: Payer: 59 | Attending: Hematology

## 2020-04-10 DIAGNOSIS — R1310 Dysphagia, unspecified: Secondary | ICD-10-CM | POA: Diagnosis present

## 2020-04-10 NOTE — Patient Instructions (Signed)
  Continue to do the exercises once-twice a day until end of January- early February then down to once a day, two-three times a week.

## 2020-04-10 NOTE — Telephone Encounter (Signed)
Pt aware order printed and placed on providers desk for signature

## 2020-04-10 NOTE — Therapy (Signed)
[facility] [address] [place], [place], [address] Phone: [phone]   Fax:  [phone]  Speech Language Pathology Treatment/ Renewal-Discharge Summary  Patient Details  Name: Dustin Mogel Sr. MRN: [id number] Date of Birth: 27-Sep-1965 Referring Provider (SLP): Eppie Gibson, MD   Encounter Date: 04/10/2020   End of Session - 04/10/20 1132    Visit Number 6    Number of Visits 7    Date for SLP Re-Evaluation 04/10/20    SLP Start Time 71    SLP Stop Time  1058    SLP Time Calculation (min) 39 min    Activity Tolerance Patient tolerated treatment well           Past Medical History:  Diagnosis Date  . Arthritis   . Change in stool 02/22/2017  . Family history of cancer   . Family history of uterine cancer   . Gout 02/22/2017  . Hyperlipidemia   . Sleep apnea    Uses CPAP  . Tonsil cancer [facility]) DX'd 07/2019   metastatic    Past Surgical History:  Procedure Laterality Date  . CARPAL TUNNEL RELEASE Right 04/10/2019  . DIRECT LARYNGOSCOPY N/A 08/21/2019   Procedure: DIRECT LARYNGOSCOPY - WITH BX;  Surgeon: [name], MD;  Location: [facility];  Service: ENT;  Laterality: N/A;  . IR GASTROSTOMY TUBE MOD SED  10/13/2019  . IR IMAGING GUIDED PORT INSERTION  10/13/2019  . NECK DISSECTION  09/11/2019  . RADICAL NECK DISSECTION Right 09/11/2019   Procedure: RADICAL NECK DISSECTION;  Surgeon: [name], MD;  Location: [facility];  Service: ENT;  Laterality: Right;  . TONSILLECTOMY Bilateral 08/21/2019   Procedure: TONSILLECTOMY;  Surgeon: [name], MD;  Location: [facility];  Service: ENT;  Laterality: Bilateral;  . WISDOM TOOTH EXTRACTION      SPEECH THERAPY DISCHARGE SUMMARY  Visits from Start of Care: 6  Current functional level related to goals / functional outcomes: See goals below.   Remaining deficits: Pt swallow is functional/normal at this time. Due to xerostomia  pt requires more liquid with POs than prior ot rad tx.   Education / Equipment: Late effects rad tx on swallow function, HEP   Plan: Patient agrees to discharge.  Patient goals were met. Patient is being discharged due to meeting the stated rehab goals.  ?????       There were no vitals filed for this visit.   Subjective Assessment - 04/10/20 1023    Subjective "My bad days are getting less and my good days are getting more." Pt hasn't used PEG in 6 weeks.    Patient is accompained by: Family member    Currently in Pain? No/denies                 ADULT SLP TREATMENT - 04/10/20 1024      General Information   Behavior/Cognition Alert;Cooperative;Pleasant mood      Treatment Provided   Treatment provided Dysphagia      Dysphagia Treatment   Temperature Spikes Noted No    Respiratory Status Room air    Treatment Methods Skilled observation;Therapeutic exercise    Patient observed directly with PO's Yes    Type of PO's observed Thin liquids;Dysphagia 3 (soft)    Liquids provided via Cup    Oral Phase Signs & Symptoms Other (comment)   none noted today   Pharyngeal Phase Signs & Symptoms --   none noted   Other treatment/comments  Pt states that fe feels like most of his problem is with lack of appetite adn xerostomia. "First few bites of anything is good, but then after that I just lose interest." Has had POs of dys II-regular diet. Min A with super supraglottic; independent with all others.       Assessment / Recommendations / Plan   Plan Discharge SLP treatment due to (comment)      Dysphagia Recommendations   Diet recommendations Regular    Medication Administration Whole meds with liquid      Progression Toward Goals   Progression toward goals Goals met, education completed, patient discharged from [facility] - 04/10/20 1132    Education Details late effects head/neck radiation on swallow function    Person(s) Educated Patient     Methods Explanation    Comprehension Verbalized understanding            SLP Short Term Goals - 12/14/19 1556      SLP SHORT TERM GOAL #1   Title Pt will demo knowledge of rationale for HEP completion    Period --   sessions, for all STGs   Status Achieved      SLP SHORT TERM GOAL #2   Title Pt will complete HEP with rare min A over two sessions    Baseline 11-09-19, 12-14-19    Status Achieved      SLP SHORT TERM GOAL #3   Title Pt will tell SLP how a food journal can hasten/facilitate return to more normalized diet in 2 sessions    Baseline 12-14-19    Time 2    Status Revised      SLP SHORT TERM GOAL #4   Title Pt will tell SLP 3 overt s/s aspiration PNA with modified independence    Status Achieved            SLP Long Term Goals - 04/10/20 1048      SLP LONG TERM GOAL #1   Title Pt will complete HEP with modified independence over 3 visits    Baseline 11-09-19, 01-11-20    Period --   sessions, for all LTGs   Status Achieved      SLP LONG TERM GOAL #2   Title Pt will tell SLP when to decr frequency of HEP    Status Achieved            Plan - 04/10/20 1133    Clinical Impression Statement Pt presents today with WNL/WFL swallowing ability with mini-muffins and water. No overt s/sx aspiration PNA reported or observed today. See "other comments" for more detail. Data suggests that as pts progress through rad or chemorad therapy that their swallowing ability will decrease. Also, WNL swallowing is threatened by muscle fibrosis that will likely develop after rad/chemorad is completed. Skilled ST will conitnue with todays session and then conclude.    Speech Therapy Frequency --   once approx every 4 weeks   Duration --   7 total sessions   Treatment/Interventions Aspiration precaution training;Pharyngeal strengthening exercises;Diet toleration management by SLP;Trials of upgraded texture/liquids;Patient/family education;SLP instruction and feedback    Potential to [place] provided today    Consulted and Agree with Plan of Care Patient           Patient will benefit from skilled therapeutic intervention in order to improve the following deficits and impairments:  Dysphagia, unspecified type    Problem List Patient Active Problem List   Diagnosis Date Noted  . Nephrolithiasis 03/21/2020  . Family history of uterine cancer   . Family history of cancer   . Port-A-Cath in place 12/28/2019  . Hematuria 12/18/2019  . Counseling regarding advance care planning and goals of care 10/12/2019  . Malignant neoplasm of tonsillar fossa ([place]) 09/11/2019  . S/P tonsillectomy 08/21/2019  . Squamous cell carcinoma of head and neck ([place]) 08/10/2019  . Morbid obesity ([place]) 06/08/2019  . Moderate mixed hyperlipidemia not requiring statin therapy 06/08/2019  . OSA (obstructive sleep apnea) 06/08/2019  . Change in stool 02/22/2017  . Gout 02/22/2017    Jaeleigh Monaco ,MS, CCC-SLP  04/10/2020, 11:34 AM  [facility] [address] [place], [place], [address] Phone: [phone]   Fax:  [phone]   Name: Nimesh Riolo Sr. MRN: [id number] Date of Birth: 11/28/1965

## 2020-04-11 ENCOUNTER — Ambulatory Visit (HOSPITAL_COMMUNITY): Payer: 59 | Attending: Hematology | Admitting: Physical Therapy

## 2020-04-11 ENCOUNTER — Other Ambulatory Visit: Payer: Self-pay

## 2020-04-11 DIAGNOSIS — M6281 Muscle weakness (generalized): Secondary | ICD-10-CM | POA: Diagnosis present

## 2020-04-11 DIAGNOSIS — M25511 Pain in right shoulder: Secondary | ICD-10-CM | POA: Insufficient documentation

## 2020-04-11 DIAGNOSIS — I890 Lymphedema, not elsewhere classified: Secondary | ICD-10-CM | POA: Insufficient documentation

## 2020-04-11 DIAGNOSIS — R293 Abnormal posture: Secondary | ICD-10-CM | POA: Insufficient documentation

## 2020-04-11 DIAGNOSIS — M436 Torticollis: Secondary | ICD-10-CM | POA: Insufficient documentation

## 2020-04-11 DIAGNOSIS — M25611 Stiffness of right shoulder, not elsewhere classified: Secondary | ICD-10-CM | POA: Diagnosis present

## 2020-04-11 NOTE — Therapy (Signed)
[facility] [place], [place], [address] Phone: [phone]   Fax:  [phone]  Physical Therapy Treatment  Patient Details  Name: Dustin Boedecker Sr. MRN: [id number] Date of Birth: [birth year] Referring Provider (PT): Dr. Irene Limbo   Encounter Date: 04/11/2020   PT End of Session - 04/11/20 1001    Visit Number 16    Number of Visits 21    Date for PT Re-Evaluation 04/30/20    Progress Note Due on Visit 21    PT Start Time 0832    PT Stop Time 0913    PT Time Calculation (min) 41 min    Activity Tolerance Patient tolerated treatment well    Behavior During Therapy [facility] for tasks assessed/performed           Past Medical History:  Diagnosis Date   Arthritis    Change in stool 02/22/2017   Family history of cancer    Family history of uterine cancer    Gout 02/22/2017   Hyperlipidemia    Sleep apnea    Uses CPAP   Tonsil cancer ([place]) DX'd 07/2019   metastatic    Past Surgical History:  Procedure Laterality Date   CARPAL TUNNEL RELEASE Right 04/10/2019   DIRECT LARYNGOSCOPY N/A 08/21/2019   Procedure: DIRECT LARYNGOSCOPY - WITH BX;  Surgeon: [name], MD;  Location: [facility];  Service: ENT;  Laterality: N/A;   IR GASTROSTOMY TUBE MOD SED  10/13/2019   IR IMAGING GUIDED PORT INSERTION  10/13/2019   NECK DISSECTION  09/11/2019   RADICAL NECK DISSECTION Right 09/11/2019   Procedure: RADICAL NECK DISSECTION;  Surgeon: [name], MD;  Location: [facility];  Service: ENT;  Laterality: Right;   TONSILLECTOMY Bilateral 08/21/2019   Procedure: TONSILLECTOMY;  Surgeon: [name], MD;  Location: [facility];  Service: ENT;  Laterality: Bilateral;   WISDOM TOOTH EXTRACTION      There were no vitals filed for this visit.   Subjective Assessment - 04/11/20 0832    Subjective Pt states that he is doing well no pain.    Pertinent History Rt tonsil cancer post tonsillectomy and  adnoidectomy 08/21/19 with scheduled neck dissection on 09/11/19.  Radiation and chemotherapy completed July 22,2021  Still has a feeding tube because he has no appetite.    Patient Stated Goals Improve neck motion so he can see to back up when driving,, improve Right shoulder ROM for reaching    Currently in Pain? No/denies                             [facility] Adult PT Treatment/Exercise - 04/11/20 0001      Neck Exercises: Seated   Cervical Rotation Both;5 reps      Shoulder Exercises: Seated   Other Seated Exercises ball around the world       Shoulder Exercises: Standing   Horizontal ABduction AROM;Both;10 reps    External Rotation AAROM;Both;10 reps   for full ROM   Flexion AAROM;Both;10 reps    ABduction AAROM;Both;10 reps      Manual Therapy   Manual Therapy Joint mobilization;Soft tissue mobilization;Passive ROM    Manual therapy comments completed seperately from all other skilled interventions at EOS    Joint Mobilization to improve cervical ROM     Manual Lymphatic Drainage (MLD) foam cut for cervical compression.  PT Long Term Goals - 04/09/20 1336      PT LONG TERM GOAL #1   Title Pt will have active shoulder flexion and scaption atleast 130 degrees for improved reaching ability    Time 6    Period Weeks    Status Partially Met      PT LONG TERM GOAL #2   Title Pt will note improved ability to look over shoulder to back up while driving without turning his entire body    Time 6    Period Weeks    Status Partially Met      PT LONG TERM GOAL #3   Title pt will demonstrate ability to open mouth to 25 mm or greater for improved ability to eat    Time 6    Period Weeks    Status Achieved      PT LONG TERM GOAL #4   Title Right shoulder strength atleast 4-4+/5 for improved use of right UE    Time 6    Period Weeks    Status Not Met                 Plan - 04/11/20 1002    Clinical Impression Statement  Treatment focused on improving shoulder strength and cervical ROM.  Therapist cut foam for cervical area requesting pt to attmept to work up to wearing for four hours per day.  PT given HEP of hanging head off bed to stretch anterior soft tissue mm and increasing cervical extension.    Personal Factors and Comorbidities Comorbidity 2    Comorbidities Modified radical neck dissection with spinal accessory nerve not preserved, post chemo and radiation    Examination-Activity Limitations Reach Overhead;Self Feeding;Dressing    Examination-Participation Restrictions Occupation;Driving;Other    Stability/Clinical Decision Making Stable/Uncomplicated    Rehab Potential Excellent    PT Frequency 3x / week    PT Duration 8 weeks    PT Treatment/Interventions ADLs/Self Care Home Management;Neuromuscular re-education;Passive range of motion;Manual techniques;Manual lymph drainage;Patient/family education;Therapeutic exercise;Therapeutic activities    PT Next Visit Plan Continue to improve postural strengthening, cervical and Rt shoulder ROM and strength.  Continue with  manual for scar and induration reduction to improve cervical ROM    PT Home Exercise Plan Post op cervical exs; Cervical rot and SB with 5 sec hold, gentle UT stretches, cervical and scapular retraction:  10/20:  Shoulder wand exercises to improve ROM; 10/27: table slides for flexion and abduction; 11/5: instructed to complete wand exercises sitting; 11/8 1-8 self manual techniques for chest lymphedema11/19: shoulder self jt mobs for ant,inferior and posterior capsule.           Patient will benefit from skilled therapeutic intervention in order to improve the following deficits and impairments:  Increased fascial restricitons, Decreased mobility, Decreased scar mobility, Postural dysfunction, Decreased activity tolerance, Decreased range of motion, Decreased strength, Impaired UE functional use, Decreased knowledge of precautions, Increased  edema  Visit Diagnosis: Neck stiffness  Muscle weakness (generalized)  Lymphedema, not elsewhere classified     Problem List Patient Active Problem List   Diagnosis Date Noted   Nephrolithiasis 03/21/2020   Family history of uterine cancer    Family history of cancer    Port-A-Cath in place 12/28/2019   Hematuria 12/18/2019   Counseling regarding advance care planning and goals of care 10/12/2019   Malignant neoplasm of tonsillar fossa ([place]) 09/11/2019   S/P tonsillectomy 08/21/2019   Squamous cell carcinoma of head and neck ([place]) 08/10/2019  Morbid obesity ([place]) 06/08/2019   Moderate mixed hyperlipidemia not requiring statin therapy 06/08/2019   OSA (obstructive sleep apnea) 06/08/2019   Change in stool 02/22/2017   Gout 02/22/2017    Rayetta Humphrey, PT CLT [phone] 04/11/2020, 10:05 AM  [facility] [place], [place], [address] Phone: [phone]   Fax:  [phone]  Name: Jaysen Wey Sr. MRN: [id number] Date of Birth: 1965/05/27

## 2020-04-17 ENCOUNTER — Other Ambulatory Visit: Payer: Self-pay

## 2020-04-17 ENCOUNTER — Ambulatory Visit (HOSPITAL_COMMUNITY): Payer: 59 | Admitting: Physical Therapy

## 2020-04-17 DIAGNOSIS — M436 Torticollis: Secondary | ICD-10-CM | POA: Diagnosis not present

## 2020-04-17 DIAGNOSIS — I890 Lymphedema, not elsewhere classified: Secondary | ICD-10-CM

## 2020-04-17 DIAGNOSIS — M6281 Muscle weakness (generalized): Secondary | ICD-10-CM

## 2020-04-17 NOTE — Therapy (Signed)
[facility] [place], [place], [address] Phone: [phone]   Fax:  [phone]  Physical Therapy Treatment  Patient Details  Name: Dustin Shackett Sr. MRN: [id number] Date of Birth: 08-06-1965 Referring Provider (PT): Dr. Irene Limbo   Encounter Date: 04/17/2020   PT End of Session - 04/17/20 1559    Visit Number 17    Number of Visits 21    Date for PT Re-Evaluation 04/30/20    Progress Note Due on Visit 21    PT Start Time 1520    PT Stop Time 1610    PT Time Calculation (min) 50 min    Activity Tolerance Patient tolerated treatment well    Behavior During Therapy [facility] for tasks assessed/performed           Past Medical History:  Diagnosis Date  . Arthritis   . Change in stool 02/22/2017  . Family history of cancer   . Family history of uterine cancer   . Gout 02/22/2017  . Hyperlipidemia   . Sleep apnea    Uses CPAP  . Tonsil cancer [facility]) DX'd 07/2019   metastatic    Past Surgical History:  Procedure Laterality Date  . CARPAL TUNNEL RELEASE Right 04/10/2019  . DIRECT LARYNGOSCOPY N/A 08/21/2019   Procedure: DIRECT LARYNGOSCOPY - WITH BX;  Surgeon: [name], MD;  Location: [facility];  Service: ENT;  Laterality: N/A;  . IR GASTROSTOMY TUBE MOD SED  10/13/2019  . IR IMAGING GUIDED PORT INSERTION  10/13/2019  . NECK DISSECTION  09/11/2019  . RADICAL NECK DISSECTION Right 09/11/2019   Procedure: RADICAL NECK DISSECTION;  Surgeon: [name], MD;  Location: [facility];  Service: ENT;  Laterality: Right;  . TONSILLECTOMY Bilateral 08/21/2019   Procedure: TONSILLECTOMY;  Surgeon: [name], MD;  Location: [facility];  Service: ENT;  Laterality: Bilateral;  . WISDOM TOOTH EXTRACTION      There were no vitals filed for this visit.   Subjective Assessment - 04/17/20 1525    Subjective PT states that he was able to wear his compression one time  over the weekend he was able to tolerate it for  3.5 hours    Pertinent History Rt tonsil cancer post tonsillectomy and adnoidectomy 08/21/19 with scheduled neck dissection on 09/11/19.  Radiation and chemotherapy completed July 22,2021  Still has a feeding tube because he has no appetite.    Patient Stated Goals Improve neck motion so he can see to back up when driving,, improve Right shoulder ROM for reaching    Currently in Pain? No/denies                             [facility] Adult PT Treatment/Exercise - 04/17/20 0001      Exercises   Exercises Neck;Shoulder      Neck Exercises: Supine   Cervical Isometrics Extension;Right lateral flexion;Left lateral flexion;5 secs    Neck Retraction 5 reps    Cervical Rotation Both;5 reps    Other Supine Exercise PROM for all       Shoulder Exercises: Standing   Horizontal ABduction Both;10 reps    External Rotation AAROM;Both;10 reps   for full ROM   Theraband Level (Shoulder External Rotation) --   3# dowel    Flexion AAROM;Both;10 reps    ABduction AAROM;Both;10 reps    Other Standing Exercises thumb tacks x 1 min     Other  Standing Exercises wall push up x 10       Manual Therapy   Manual Therapy Joint mobilization;Soft tissue mobilization;Passive ROM    Manual therapy comments completed seperately from all other skilled interventions at EOS    Joint Mobilization to improve cervical ROM                        PT Long Term Goals - 04/09/20 1336      PT LONG TERM GOAL #1   Title Pt will have active shoulder flexion and scaption atleast 130 degrees for improved reaching ability    Time 6    Period Weeks    Status Partially Met      PT LONG TERM GOAL #2   Title Pt will note improved ability to look over shoulder to back up while driving without turning his entire body    Time 6    Period Weeks    Status Partially Met      PT LONG TERM GOAL #3   Title pt will demonstrate ability to open mouth to 25 mm or greater for improved ability to eat    Time 6     Period Weeks    Status Achieved      PT LONG TERM GOAL #4   Title Right shoulder strength atleast 4-4+/5 for improved use of right UE    Time 6    Period Weeks    Status Not Met                 Plan - 04/17/20 1559    Clinical Impression Statement Treatment continues to focus on functional strengthening of Rt UE with pt needing therapist to AA last 25%;JT mobs completed for improved cervical rotation.    Personal Factors and Comorbidities Comorbidity 2    Comorbidities Modified radical neck dissection with spinal accessory nerve not preserved, post chemo and radiation    Examination-Activity Limitations Reach Overhead;Self Feeding;Dressing    Examination-Participation Restrictions Occupation;Driving;Other    Stability/Clinical Decision Making Stable/Uncomplicated    Rehab Potential Excellent    PT Frequency 3x / week    PT Duration 8 weeks    PT Treatment/Interventions ADLs/Self Care Home Management;Neuromuscular re-education;Passive range of motion;Manual techniques;Manual lymph drainage;Patient/family education;Therapeutic exercise;Therapeutic activities    PT Next Visit Plan Continue to improve postural strengthening, cervical and Rt shoulder ROM and strength.  Continue with  manual for scar and induration reduction to improve cervical ROM    PT Home Exercise Plan Post op cervical exs; Cervical rot and SB with 5 sec hold, gentle UT stretches, cervical and scapular retraction:  10/20:  Shoulder wand exercises to improve ROM; 10/27: table slides for flexion and abduction; 11/5: instructed to complete wand exercises sitting; 11/8 1-8 self manual techniques for chest lymphedema11/19: shoulder self jt mobs for ant,inferior and posterior capsule.           Patient will benefit from skilled therapeutic intervention in order to improve the following deficits and impairments:  Increased fascial restricitons, Decreased mobility, Decreased scar mobility, Postural dysfunction, Decreased  activity tolerance, Decreased range of motion, Decreased strength, Impaired UE functional use, Decreased knowledge of precautions, Increased edema  Visit Diagnosis: Muscle weakness (generalized)  Neck stiffness  Lymphedema, not elsewhere classified     Problem List Patient Active Problem List   Diagnosis Date Noted  . Nephrolithiasis 03/21/2020  . Family history of uterine cancer   . Family history of cancer   .  Port-A-Cath in place 12/28/2019  . Hematuria 12/18/2019  . Counseling regarding advance care planning and goals of care 10/12/2019  . Malignant neoplasm of tonsillar fossa ([place]) 09/11/2019  . S/P tonsillectomy 08/21/2019  . Squamous cell carcinoma of head and neck ([place]) 08/10/2019  . Morbid obesity ([place]) 06/08/2019  . Moderate mixed hyperlipidemia not requiring statin therapy 06/08/2019  . OSA (obstructive sleep apnea) 06/08/2019  . Change in stool 02/22/2017  . Gout 02/22/2017    Rayetta Humphrey, PT CLT [phone] 04/17/2020, 4:15 PM  [facility] [address] [place], [place], [address] Phone: [phone]   Fax:  [phone]  Name: Dustin Crymes Sr. MRN: [id number] Date of Birth: [birth year]

## 2020-04-18 ENCOUNTER — Encounter (HOSPITAL_COMMUNITY): Payer: 59 | Admitting: Physical Therapy

## 2020-04-19 NOTE — Telephone Encounter (Signed)
Order faxed & received by [employer] on 04/12/20

## 2020-04-23 ENCOUNTER — Ambulatory Visit (HOSPITAL_COMMUNITY): Payer: 59 | Admitting: Physical Therapy

## 2020-04-23 ENCOUNTER — Other Ambulatory Visit: Payer: Self-pay

## 2020-04-23 DIAGNOSIS — M25511 Pain in right shoulder: Secondary | ICD-10-CM

## 2020-04-23 DIAGNOSIS — M25611 Stiffness of right shoulder, not elsewhere classified: Secondary | ICD-10-CM

## 2020-04-23 DIAGNOSIS — M436 Torticollis: Secondary | ICD-10-CM

## 2020-04-23 DIAGNOSIS — M6281 Muscle weakness (generalized): Secondary | ICD-10-CM

## 2020-04-23 DIAGNOSIS — R293 Abnormal posture: Secondary | ICD-10-CM

## 2020-04-23 DIAGNOSIS — I890 Lymphedema, not elsewhere classified: Secondary | ICD-10-CM

## 2020-04-23 NOTE — Therapy (Signed)
[place], [place], [address] Phone: [phone]   Fax:  [phone]  Physical Therapy Treatment  Patient Details  Name: Dustin Erney Sr. MRN: [id number] Date of Birth: October 18, 1965 Referring Provider (PT): Dr. Irene Limbo   Encounter Date: 04/23/2020   PT End of Session - 04/23/20 0859    Visit Number 18    Number of Visits 21    Date for PT Re-Evaluation 04/30/20    Progress Note Due on Visit 21    PT Start Time 0830    PT Stop Time 0910    PT Time Calculation (min) 40 min    Activity Tolerance Patient tolerated treatment well    Behavior During Therapy [facility] for tasks assessed/performed           Past Medical History:  Diagnosis Date  . Arthritis   . Change in stool 02/22/2017  . Family history of cancer   . Family history of uterine cancer   . Gout 02/22/2017  . Hyperlipidemia   . Sleep apnea    Uses CPAP  . Tonsil cancer [facility]) DX'd 07/2019   metastatic    Past Surgical History:  Procedure Laterality Date  . CARPAL TUNNEL RELEASE Right 04/10/2019  . DIRECT LARYNGOSCOPY N/A 08/21/2019   Procedure: DIRECT LARYNGOSCOPY - WITH BX;  Surgeon: [name], MD;  Location: [place];  Service: ENT;  Laterality: N/A;  . IR GASTROSTOMY TUBE MOD SED  10/13/2019  . IR IMAGING GUIDED PORT INSERTION  10/13/2019  . NECK DISSECTION  09/11/2019  . RADICAL NECK DISSECTION Right 09/11/2019   Procedure: RADICAL NECK DISSECTION;  Surgeon: [name], MD;  Location: [place];  Service: ENT;  Laterality: Right;  . TONSILLECTOMY Bilateral 08/21/2019   Procedure: TONSILLECTOMY;  Surgeon: [name], MD;  Location: [place];  Service: ENT;  Laterality: Bilateral;  . WISDOM TOOTH EXTRACTION      There were no vitals filed for this visit.   Subjective Assessment - 04/23/20 0831    Subjective Pt states that his back has been bothering him for the past four days.    Pertinent History Rt tonsil cancer  post tonsillectomy and adnoidectomy 08/21/19 with scheduled neck dissection on 09/11/19.  Radiation and chemotherapy completed July 22,2021  Still has a feeding tube because he has no appetite.    Patient Stated Goals Improve neck motion so he can see to back up when driving,, improve Right shoulder ROM for reaching    Currently in Pain? Yes    Pain Score 7     Pain Location Back    Pain Orientation Lower    Pain Descriptors / Indicators Sharp;Stabbing    Pain Type Acute pain    Pain Onset In the past 7 days    Pain Frequency Intermittent    Aggravating Factors  certain motions it just grabs him .    Pain Relieving Factors pain is quick it happens several times a day but goes away on it's own    Effect of Pain on Daily Activities keeps going                             [facility] Adult PT Treatment/Exercise - 04/23/20 0001      Exercises   Exercises Shoulder      Neck Exercises: Standing   Other Standing Exercises standing excursionx x 3      Neck Exercises:  Seated   Other Seated Exercise thoracic excursions x 3      Neck Exercises: Supine   Cervical Rotation Both;5 reps    Shoulder Flexion Both;10 reps;Limitations    Shoulder Flexion Limitations 3# dowel    Shoulder ABduction Both;10 reps;Limitations    Shoulder Abduction Limitations 3# dowel    Upper Extremity D1 10 reps;Flexion;Extension    UE D1 Weights (lbs) 2    Upper Extremity D2 10 reps;Weights    Theraband Level (UE D2) Level 2 (Red)    Other Supine Exercise PROM for all       Neck Exercises: Sidelying   Lateral Flexion Both;10 reps    Other Sidelying Exercise shoulder abduction 10                       PT Long Term Goals - 04/09/20 1336      PT LONG TERM GOAL #1   Title Pt will have active shoulder flexion and scaption atleast 130 degrees for improved reaching ability    Time 6    Period Weeks    Status Partially Met      PT LONG TERM GOAL #2   Title Pt will note improved ability  to look over shoulder to back up while driving without turning his entire body    Time 6    Period Weeks    Status Partially Met      PT LONG TERM GOAL #3   Title pt will demonstrate ability to open mouth to 25 mm or greater for improved ability to eat    Time 6    Period Weeks    Status Achieved      PT LONG TERM GOAL #4   Title Right shoulder strength atleast 4-4+/5 for improved use of right UE    Time 6    Period Weeks    Status Not Met                 Plan - 04/23/20 0900    Clinical Impression Statement PT complaining of back pain therefore treatmen was modified to make sure that low back would not be stressed. Pt continues to have limited cervical rotation and Rt UE ROM, strength and coordination requiring skilled PT to assist in regaining functional use of pt UE>    Personal Factors and Comorbidities Comorbidity 2    Comorbidities Modified radical neck dissection with spinal accessory nerve not preserved, post chemo and radiation    Examination-Activity Limitations Reach Overhead;Self Feeding;Dressing    Examination-Participation Restrictions Occupation;Driving;Other    Stability/Clinical Decision Making Stable/Uncomplicated    Rehab Potential Excellent    PT Frequency 3x / week    PT Duration 8 weeks    PT Treatment/Interventions ADLs/Self Care Home Management;Neuromuscular re-education;Passive range of motion;Manual techniques;Manual lymph drainage;Patient/family education;Therapeutic exercise;Therapeutic activities    PT Next Visit Plan If back has less pain may continue with standing exercises.  Continue to improve postural strengthening, cervical and Rt shoulder ROM and strength.  Continue with  manual for scar and induration reduction to improve cervical ROM    PT Home Exercise Plan Post op cervical exs; Cervical rot and SB with 5 sec hold, gentle UT stretches, cervical and scapular retraction:  10/20:  Shoulder wand exercises to improve ROM; 10/27: table slides for  flexion and abduction; 11/5: instructed to complete wand exercises sitting; 11/8 1-8 self manual techniques for chest lymphedema11/19: shoulder self jt mobs for ant,inferior and posterior capsule.  Patient will benefit from skilled therapeutic intervention in order to improve the following deficits and impairments:  Increased fascial restricitons,Decreased mobility,Decreased scar mobility,Postural dysfunction,Decreased activity tolerance,Decreased range of motion,Decreased strength,Impaired UE functional use,Decreased knowledge of precautions,Increased edema  Visit Diagnosis: Muscle weakness (generalized)  Neck stiffness  Lymphedema, not elsewhere classified  Abnormal posture  Acute pain of right shoulder  Stiffness of right shoulder, not elsewhere classified     Problem List Patient Active Problem List   Diagnosis Date Noted  . Nephrolithiasis 03/21/2020  . Family history of uterine cancer   . Family history of cancer   . Port-A-Cath in place 12/28/2019  . Hematuria 12/18/2019  . Counseling regarding advance care planning and goals of care 10/12/2019  . Malignant neoplasm of tonsillar fossa ([place]) 09/11/2019  . S/P tonsillectomy 08/21/2019  . Squamous cell carcinoma of head and neck ([place]) 08/10/2019  . Morbid obesity ([employer]) 06/08/2019  . Moderate mixed hyperlipidemia not requiring statin therapy 06/08/2019  . OSA (obstructive sleep apnea) 06/08/2019  . Change in stool 02/22/2017  . Gout 02/22/2017    Rayetta Humphrey, PT CLT [phone] 04/23/2020, 9:13 AM  [facility] [address] [place], [place], [address] Phone: [phone]   Fax:  [phone]  Name: Dustin Moronta Sr. MRN: [id number] Date of Birth: May 04, 1966

## 2020-04-25 ENCOUNTER — Ambulatory Visit (HOSPITAL_COMMUNITY): Payer: 59

## 2020-04-25 ENCOUNTER — Other Ambulatory Visit: Payer: Self-pay

## 2020-04-25 ENCOUNTER — Encounter (HOSPITAL_COMMUNITY): Payer: Self-pay

## 2020-04-25 DIAGNOSIS — M6281 Muscle weakness (generalized): Secondary | ICD-10-CM

## 2020-04-25 DIAGNOSIS — R293 Abnormal posture: Secondary | ICD-10-CM

## 2020-04-25 DIAGNOSIS — M436 Torticollis: Secondary | ICD-10-CM

## 2020-04-25 DIAGNOSIS — I890 Lymphedema, not elsewhere classified: Secondary | ICD-10-CM

## 2020-04-25 DIAGNOSIS — M25511 Pain in right shoulder: Secondary | ICD-10-CM

## 2020-04-25 NOTE — Therapy (Signed)
[place], [place], [address] Phone: [phone]   Fax:  [phone]  Physical Therapy Treatment  Patient Details  Name: Dustin Yeargan Sr. MRN: [id number] Date of Birth: Feb 01, 1966 Referring Provider (PT): Dr. Irene Limbo   Encounter Date: 04/25/2020   PT End of Session - 04/25/20 0917    Visit Number 19    Number of Visits 21    Date for PT Re-Evaluation 04/30/20    Progress Note Due on Visit 21    PT Start Time 0829    PT Stop Time 0912    PT Time Calculation (min) 43 min    Activity Tolerance Patient tolerated treatment well    Behavior During Therapy [facility] for tasks assessed/performed           Past Medical History:  Diagnosis Date  . Arthritis   . Change in stool 02/22/2017  . Family history of cancer   . Family history of uterine cancer   . Gout 02/22/2017  . Hyperlipidemia   . Sleep apnea    Uses CPAP  . Tonsil cancer [facility]) DX'd 07/2019   metastatic    Past Surgical History:  Procedure Laterality Date  . CARPAL TUNNEL RELEASE Right 04/10/2019  . DIRECT LARYNGOSCOPY N/A 08/21/2019   Procedure: DIRECT LARYNGOSCOPY - WITH BX;  Surgeon: [name], MD;  Location: [place];  Service: ENT;  Laterality: N/A;  . IR GASTROSTOMY TUBE MOD SED  10/13/2019  . IR IMAGING GUIDED PORT INSERTION  10/13/2019  . NECK DISSECTION  09/11/2019  . RADICAL NECK DISSECTION Right 09/11/2019   Procedure: RADICAL NECK DISSECTION;  Surgeon: [name], MD;  Location: [place];  Service: ENT;  Laterality: Right;  . TONSILLECTOMY Bilateral 08/21/2019   Procedure: TONSILLECTOMY;  Surgeon: [name], MD;  Location: [place];  Service: ENT;  Laterality: Bilateral;  . WISDOM TOOTH EXTRACTION      There were no vitals filed for this visit.   Subjective Assessment - 04/25/20 0912    Subjective Pt stated his back is feeling better, has the occasional twinge.    Pertinent History Rt tonsil cancer post  tonsillectomy and adnoidectomy 08/21/19 with scheduled neck dissection on 09/11/19.  Radiation and chemotherapy completed July 22,2021  Still has a feeding tube because he has no appetite.    Patient Stated Goals Improve neck motion so he can see to back up when driving,, improve Right shoulder ROM for reaching    Currently in Pain? Yes    Pain Score 1     Pain Location Back    Pain Orientation Lower    Pain Descriptors / Indicators Sore    Pain Type Acute pain    Pain Onset In the past 7 days    Pain Frequency Intermittent    Aggravating Factors  certain motions it just grabs him    Pain Relieving Factors pain is quick it happens several times a day but goes away on it's own.    Effect of Pain on Daily Activities keep going                             [facility] Adult PT Treatment/Exercise - 04/25/20 0001      Posture/Postural Control   Posture/Postural Control Postural limitations    Postural Limitations Rounded Shoulders;Forward head;Increased thoracic kyphosis      Exercises   Exercises Shoulder  Neck Exercises: Standing   Wall Push Ups 10 reps    Upper Extremity Flexion with Stabilization Flexion;10 reps    Other Standing Exercises D1 10x    Other Standing Exercises 10" holds forward flexion, abduction, IR, ER, CW,CCW in 90 degree abduction      Shoulder Exercises: Seated   Other Seated Exercises contract/relax for cervical rotation 3x each      Shoulder Exercises: Standing   Horizontal ABduction Both;10 reps    External Rotation AAROM;Both;10 reps    Flexion AAROM;Both;10 reps    ABduction AAROM;Both;10 reps      Shoulder Exercises: Stretch   Other Shoulder Stretches Child's pose 2x 30"                       PT Long Term Goals - 04/09/20 1336      PT LONG TERM GOAL #1   Title Pt will have active shoulder flexion and scaption atleast 130 degrees for improved reaching ability    Time 6    Period Weeks    Status Partially Met      PT  LONG TERM GOAL #2   Title Pt will note improved ability to look over shoulder to back up while driving without turning his entire body    Time 6    Period Weeks    Status Partially Met      PT LONG TERM GOAL #3   Title pt will demonstrate ability to open mouth to 25 mm or greater for improved ability to eat    Time 6    Period Weeks    Status Achieved      PT LONG TERM GOAL #4   Title Right shoulder strength atleast 4-4+/5 for improved use of right UE    Time 6    Period Weeks    Status Not Met                 Plan - 04/25/20 1027    Clinical Impression Statement Session focus with Rt UE strengthening.  Pt continues need for AAROM last 25% end range due to weakness.  Visible musculature fatigue with activities today, no reports of increased pain.  Cueing to reduce increased lordacic curvature during standing exercises to reduce compensation as well as LBP.  Use mirror infront to improve awareness of posture.    Personal Factors and Comorbidities Comorbidity 2    Comorbidities Modified radical neck dissection with spinal accessory nerve not preserved, post chemo and radiation    Examination-Activity Limitations Reach Overhead;Self Feeding;Dressing    Examination-Participation Restrictions Occupation;Driving;Other    Stability/Clinical Decision Making Stable/Uncomplicated    Clinical Decision Making Low    Rehab Potential Excellent    PT Frequency 3x / week    PT Duration 8 weeks    PT Treatment/Interventions ADLs/Self Care Home Management;Neuromuscular re-education;Passive range of motion;Manual techniques;Manual lymph drainage;Patient/family education;Therapeutic exercise;Therapeutic activities    PT Next Visit Plan Continue to improve postural strengthening, cervical and Rt shoulder ROM and strength.  Continue with  manual for scar and induration reduction to improve cervical ROM    PT Home Exercise Plan Post op cervical exs; Cervical rot and SB with 5 sec hold, gentle UT  stretches, cervical and scapular retraction:  10/20:  Shoulder wand exercises to improve ROM; 10/27: table slides for flexion and abduction; 11/5: instructed to complete wand exercises sitting; 11/8 1-8 self manual techniques for chest lymphedema11/19: shoulder self jt mobs for ant,inferior and posterior capsule.  Patient will benefit from skilled therapeutic intervention in order to improve the following deficits and impairments:  Increased fascial restricitons,Decreased mobility,Decreased scar mobility,Postural dysfunction,Decreased activity tolerance,Decreased range of motion,Decreased strength,Impaired UE functional use,Decreased knowledge of precautions,Increased edema  Visit Diagnosis: Muscle weakness (generalized)  Neck stiffness  Lymphedema, not elsewhere classified  Abnormal posture  Acute pain of right shoulder     Problem List Patient Active Problem List   Diagnosis Date Noted  . Nephrolithiasis 03/21/2020  . Family history of uterine cancer   . Family history of cancer   . Port-A-Cath in place 12/28/2019  . Hematuria 12/18/2019  . Counseling regarding advance care planning and goals of care 10/12/2019  . Malignant neoplasm of tonsillar fossa ([place]) 09/11/2019  . S/P tonsillectomy 08/21/2019  . Squamous cell carcinoma of head and neck ([place]) 08/10/2019  . Morbid obesity ([place]) 06/08/2019  . Moderate mixed hyperlipidemia not requiring statin therapy 06/08/2019  . OSA (obstructive sleep apnea) 06/08/2019  . Change in stool 02/22/2017  . Gout 02/22/2017   Ihor Austin, LPTA/CLT; CBIS [phone]  Aldona Lento 04/25/2020, 10:32 AM  [facility] [place], [place], [address] Phone: [phone]   Fax:  [phone]  Name: Dustin Ashmead Sr. MRN: [id number] Date of Birth: Jul 24, 1965

## 2020-04-30 ENCOUNTER — Other Ambulatory Visit: Payer: Self-pay

## 2020-04-30 ENCOUNTER — Encounter (HOSPITAL_COMMUNITY): Payer: Self-pay

## 2020-04-30 ENCOUNTER — Ambulatory Visit (HOSPITAL_COMMUNITY): Payer: 59

## 2020-04-30 DIAGNOSIS — R293 Abnormal posture: Secondary | ICD-10-CM

## 2020-04-30 DIAGNOSIS — M6281 Muscle weakness (generalized): Secondary | ICD-10-CM

## 2020-04-30 DIAGNOSIS — M25511 Pain in right shoulder: Secondary | ICD-10-CM

## 2020-04-30 DIAGNOSIS — M25611 Stiffness of right shoulder, not elsewhere classified: Secondary | ICD-10-CM

## 2020-04-30 DIAGNOSIS — M436 Torticollis: Secondary | ICD-10-CM | POA: Diagnosis not present

## 2020-04-30 NOTE — Therapy (Addendum)
[facility] [address] [place], [place], [address] Phone: [phone]   Fax:  [phone]  Physical Therapy Treatment Progress Note Reporting Period 03/25/20 to 04/30/20  See note below for Objective Data and Assessment of Progress/Goals.   PT has improved in both strength and ROM but remains functionally limited.  PT will continue to benefit from skilled PT to improve functional use of his Rt UE>    Patient Details  Name: Dustin Eggebrecht Sr. MRN: [id number] Date of Birth: 04-18-1966 Referring Provider (PT): Dr. Irene Limbo   Encounter Date: 04/30/2020   PT End of Session - 04/30/20 1013    Visit Number 20    Number of Visits 28   Date for PT Re-Evaluation 04/30/20    Authorization Type [employer]    Authorization Time Period Visit limit 30- 5 already used    Progress Note Due on Visit 28   PT Start Time 1005    PT Stop Time 1050    PT Time Calculation (min) 45 min    Activity Tolerance Patient tolerated treatment well    Behavior During Therapy [facility] for tasks assessed/performed           Past Medical History:  Diagnosis Date  . Arthritis   . Change in stool 02/22/2017  . Family history of cancer   . Family history of uterine cancer   . Gout 02/22/2017  . Hyperlipidemia   . Sleep apnea    Uses CPAP  . Tonsil cancer [facility]) DX'd 07/2019   metastatic    Past Surgical History:  Procedure Laterality Date  . CARPAL TUNNEL RELEASE Right 04/10/2019  . DIRECT LARYNGOSCOPY N/A 08/21/2019   Procedure: DIRECT LARYNGOSCOPY - WITH BX;  Surgeon: [name], MD;  Location: [place];  Service: ENT;  Laterality: N/A;  . IR GASTROSTOMY TUBE MOD SED  10/13/2019  . IR IMAGING GUIDED PORT INSERTION  10/13/2019  . NECK DISSECTION  09/11/2019  . RADICAL NECK DISSECTION Right 09/11/2019   Procedure: RADICAL NECK DISSECTION;  Surgeon: [name], MD;  Location: [place];  Service: ENT;  Laterality: Right;  . TONSILLECTOMY Bilateral 08/21/2019    Procedure: TONSILLECTOMY;  Surgeon: [name], MD;  Location: [place];  Service: ENT;  Laterality: Bilateral;  . WISDOM TOOTH EXTRACTION      There were no vitals filed for this visit.   Subjective Assessment - 04/30/20 1012    Subjective Pt stated he is feeling better, no reports of pain currently.    Patient Stated Goals Improve neck motion so he can see to back up when driving,, improve Right shoulder ROM for reaching    Currently in Pain? No/denies              [facility] PT Assessment - 04/30/20 0001      ROM / Strength   AROM / PROM / Strength AROM;Strength      AROM   Right Shoulder Extension 45 Degrees   was 32   Right Shoulder Flexion 130 Degrees   130 sitting was 160 supine    Right Shoulder ABduction 104 Degrees   in sitting was 90 was 165 supine    Right Shoulder Internal Rotation --   was 70   Right Shoulder External Rotation --   was 85   Left Shoulder Extension 54 Degrees   was 54   Left Shoulder Flexion 154 Degrees   was 150   Left Shoulder ABduction 152 Degrees  was 150   Left Shoulder Internal Rotation --   was 65   Left Shoulder External Rotation --   was 90   Cervical Flexion --   was 55   Cervical Extension 30   was 30   Cervical - Right Side Bend --   was 38   Cervical - Left Side Bend --   was 40   Cervical - Right Rotation 55   was 60   Cervical - Left Rotation 52   was 55     Strength   Strength Assessment Site Shoulder    Right/Left Shoulder Right;Left    Right Shoulder Flexion 3-/5   limited range   Right Shoulder Extension 4/5    Right Shoulder ABduction 3-/5   limited rnage   Right Shoulder Internal Rotation 3-/5    Right Shoulder External Rotation 3-/5    Left Shoulder Flexion 4+/5    Left Shoulder Extension 4+/5    Left Shoulder ABduction 4+/5    Left Shoulder Internal Rotation 4/5    Left Shoulder External Rotation 4/5                         [facility] Adult PT Treatment/Exercise - 04/30/20 0001       Posture/Postural Control   Posture/Postural Control Postural limitations    Postural Limitations Rounded Shoulders;Forward head;Increased thoracic kyphosis      Exercises   Exercises Shoulder      Neck Exercises: Standing   Wall Push Ups 10 reps    Upper Extremity Flexion with Stabilization Flexion;10 reps    Other Standing Exercises D1 10x    Other Standing Exercises 10" holds forward flexion, abduction, IR, ER, CW,CCW in 90 degree abduction                       PT Long Term Goals - 04/30/20 1047      PT LONG TERM GOAL #1   Title Pt will have active shoulder flexion and scaption atleast 130 degrees for improved reaching ability    Baseline 12/21:  AROM improving, not met with scaption, difficult to acheive flexion    Status On-going      PT LONG TERM GOAL #2   Title Pt will note improved ability to look over shoulder to back up while driving without turning his entire body    Baseline 04/30/20:  Reports compensation with rotation while turning    Status On-going      PT LONG TERM GOAL #3   Title pt will demonstrate ability to open mouth to 25 mm or greater for improved ability to eat    Status Achieved      PT LONG TERM GOAL #4   Title Right shoulder strength atleast 4-4+/5 for improved use of right UE    Baseline 04/30/20: Weak shoulder strength    Status On-going                 Plan - 04/30/20 1133    Clinical Impression Statement Reviewed goals per cert end date.  Pt continues to demonstrate Rt UE weakness with AAROM required beyond 90-100 degrees against gravity.  Reports he continues to compensate while driving for turns.  ROM measurement complete wtih improvements noted though continues to be impaired.  MMT complete with weakness Rt shoulder.  Verbal cueing and mirror feedback utilized to reduce compensation this session.  No reports of pain, was limited by visual muscualture fatigue.  Personal Factors and Comorbidities Comorbidity 2     Comorbidities Modified radical neck dissection with spinal accessory nerve not preserved, post chemo and radiation    Examination-Activity Limitations Reach Overhead;Self Feeding;Dressing    Examination-Participation Restrictions Occupation;Driving;Other    Stability/Clinical Decision Making Stable/Uncomplicated    Clinical Decision Making Low    Rehab Potential Excellent    PT Frequency 2x / week    PT Duration 4 weeks    PT Treatment/Interventions ADLs/Self Care Home Management;Neuromuscular re-education;Passive range of motion;Manual techniques;Manual lymph drainage;Patient/family education;Therapeutic exercise;Therapeutic activities    PT Next Visit Plan Continue to improve Rt shoulder and cervical ROM and strengthening.  Continue contract/relax to improve cervical mobility.    PT Home Exercise Plan Post op cervical exs; Cervical rot and SB with 5 sec hold, gentle UT stretches, cervical and scapular retraction:  10/20:  Shoulder wand exercises to improve ROM; 10/27: table slides for flexion and abduction; 11/5: instructed to complete wand exercises sitting; 11/8 1-8 self manual techniques for chest lymphedema11/19: shoulder self jt mobs for ant,inferior and posterior capsule.           Patient will benefit from skilled therapeutic intervention in order to improve the following deficits and impairments:  Increased fascial restricitons,Decreased mobility,Decreased scar mobility,Postural dysfunction,Decreased activity tolerance,Decreased range of motion,Decreased strength,Impaired UE functional use,Decreased knowledge of precautions,Increased edema  Visit Diagnosis: Muscle weakness (generalized)  Neck stiffness  Abnormal posture  Acute pain of right shoulder  Stiffness of right shoulder, not elsewhere classified     Problem List Patient Active Problem List   Diagnosis Date Noted  . Nephrolithiasis 03/21/2020  . Family history of uterine cancer   . Family history of cancer   .  Port-A-Cath in place 12/28/2019  . Hematuria 12/18/2019  . Counseling regarding advance care planning and goals of care 10/12/2019  . Malignant neoplasm of tonsillar fossa ([place]) 09/11/2019  . S/P tonsillectomy 08/21/2019  . Squamous cell carcinoma of head and neck ([place]) 08/10/2019  . Morbid obesity ([place]) 06/08/2019  . Moderate mixed hyperlipidemia not requiring statin therapy 06/08/2019  . OSA (obstructive sleep apnea) 06/08/2019  . Change in stool 02/22/2017  . Gout 02/22/2017   Ihor Austin, LPTA/CLT; CBIS [phone]  Rayetta Humphrey, PT CLT [phone] 04/30/2020, 11:49 AM  [facility] [address] [place], [place], [address] Phone: [phone]   Fax:  [phone]  Name: Kjuan Seipp Sr. MRN: [id number] Date of Birth: 1965/07/16

## 2020-05-01 ENCOUNTER — Telehealth: Payer: Self-pay | Admitting: Hematology

## 2020-05-01 NOTE — Telephone Encounter (Signed)
Rescheduled 12/30 appointment to 01/18 per provider pal, patient has been called and notified.

## 2020-05-02 ENCOUNTER — Ambulatory Visit (HOSPITAL_COMMUNITY): Payer: 59 | Admitting: Physical Therapy

## 2020-05-02 ENCOUNTER — Other Ambulatory Visit: Payer: Self-pay

## 2020-05-02 DIAGNOSIS — M25511 Pain in right shoulder: Secondary | ICD-10-CM

## 2020-05-02 DIAGNOSIS — M6281 Muscle weakness (generalized): Secondary | ICD-10-CM

## 2020-05-02 DIAGNOSIS — R293 Abnormal posture: Secondary | ICD-10-CM

## 2020-05-02 DIAGNOSIS — M436 Torticollis: Secondary | ICD-10-CM | POA: Diagnosis not present

## 2020-05-02 NOTE — Therapy (Signed)
[place], [place], [address] Phone: [phone]   Fax:  [phone]  Physical Therapy Treatment  Patient Details  Name: Dustin Parlato Sr. MRN: [id number] Date of Birth: 1965-12-22 Referring Provider (PT): Dr. Irene Limbo   Encounter Date: 05/02/2020   PT End of Session - 05/02/20 1436    Visit Number 21    Number of Visits 28    Date for PT Re-Evaluation 04/30/20    Authorization Type UHC    Authorization Time Period Visit limit 30- 5 already used    Progress Note Due on Visit 28    PT Start Time 1343    PT Stop Time 1426    PT Time Calculation (min) 43 min    Activity Tolerance Patient tolerated treatment well    Behavior During Therapy WFL for tasks assessed/performed           Past Medical History:  Diagnosis Date  . Arthritis   . Change in stool 02/22/2017  . Family history of cancer   . Family history of uterine cancer   . Gout 02/22/2017  . Hyperlipidemia   . Sleep apnea    Uses CPAP  . Tonsil cancer [facility]) DX'd 07/2019   metastatic    Past Surgical History:  Procedure Laterality Date  . CARPAL TUNNEL RELEASE Right 04/10/2019  . DIRECT LARYNGOSCOPY N/A 08/21/2019   Procedure: DIRECT LARYNGOSCOPY - WITH BX;  Surgeon: [name], MD;  Location: [place];  Service: ENT;  Laterality: N/A;  . IR GASTROSTOMY TUBE MOD SED  10/13/2019  . IR IMAGING GUIDED PORT INSERTION  10/13/2019  . NECK DISSECTION  09/11/2019  . RADICAL NECK DISSECTION Right 09/11/2019   Procedure: RADICAL NECK DISSECTION;  Surgeon: [name], MD;  Location: [place];  Service: ENT;  Laterality: Right;  . TONSILLECTOMY Bilateral 08/21/2019   Procedure: TONSILLECTOMY;  Surgeon: [name], MD;  Location: [place];  Service: ENT;  Laterality: Bilateral;  . WISDOM TOOTH EXTRACTION      There were no vitals filed for this visit.   Subjective Assessment - 05/02/20 1401    Subjective pt reports he feels  good today.  Never really any pain just tightness in his Rt shoulder.                             [facility] Adult PT Treatment/Exercise - 05/02/20 0001      Neck Exercises: Standing   Wall Push Ups 15 reps    Upper Extremity Flexion with Stabilization Flexion;10 reps   with AAROM above 90 degrees and slow controlled return   Other Standing Exercises standing abduction with AAROM from therapist above 85 degrees with slow controlled return 10X    Other Standing Exercises 10" holds forward flexion, abduction, IR, ER, CW,CCW in 90 degree abduction      Shoulder Exercises: Seated   Other Seated Exercises contract/relax for cervical rotation 5x each                       PT Long Term Goals - 04/30/20 1047      PT LONG TERM GOAL #1   Title Pt will have active shoulder flexion and scaption atleast 130 degrees for improved reaching ability    Baseline 12/21:  AROM improving, not met with scaption, difficult to acheive flexion    Status On-going      PT  LONG TERM GOAL #2   Title Pt will note improved ability to look over shoulder to back up while driving without turning his entire body    Baseline 04/30/20:  Reports compensation with rotation while turning    Status On-going      PT LONG TERM GOAL #3   Title pt will demonstrate ability to open mouth to 25 mm or greater for improved ability to eat    Status Achieved      PT LONG TERM GOAL #4   Title Right shoulder strength atleast 4-4+/5 for improved use of right UE    Baseline 04/30/20: Weak shoulder strength    Status On-going                 Plan - 05/02/20 1448    Clinical Impression Statement Continued with focus on improving cervical and Rt shoulder ROM/strength.  Pt with improving ability to hold and return Rt UE to neutral possessing increased eccentric control of Rt shoulder. Most assist needed from 95 degrees and above.  Pt  with more difficulty with abduction than flexion.  Neck remains tight  and indurated around scar area.  Pt reports compliance with therex and self massage at home.  Pt would like to continue 2-4 more weeks to further improve his ROM.    Personal Factors and Comorbidities Comorbidity 2    Comorbidities Modified radical neck dissection with spinal accessory nerve not preserved, post chemo and radiation    Examination-Activity Limitations Reach Overhead;Self Feeding;Dressing    Examination-Participation Restrictions Occupation;Driving;Other    Stability/Clinical Decision Making Stable/Uncomplicated    Rehab Potential Excellent    PT Frequency 2x / week    PT Duration 4 weeks    PT Treatment/Interventions ADLs/Self Care Home Management;Neuromuscular re-education;Passive range of motion;Manual techniques;Manual lymph drainage;Patient/family education;Therapeutic exercise;Therapeutic activities    PT Next Visit Plan Continue to improve Rt shoulder and cervical ROM and strengthening.  Continue contract/relax to improve cervical mobility.    PT Home Exercise Plan Post op cervical exs; Cervical rot and SB with 5 sec hold, gentle UT stretches, cervical and scapular retraction:  10/20:  Shoulder wand exercises to improve ROM; 10/27: table slides for flexion and abduction; 11/5: instructed to complete wand exercises sitting; 11/8 1-8 self manual techniques for chest lymphedema11/19: shoulder self jt mobs for ant,inferior and posterior capsule.           Patient will benefit from skilled therapeutic intervention in order to improve the following deficits and impairments:  Increased fascial restricitons,Decreased mobility,Decreased scar mobility,Postural dysfunction,Decreased activity tolerance,Decreased range of motion,Decreased strength,Impaired UE functional use,Decreased knowledge of precautions,Increased edema  Visit Diagnosis: Neck stiffness  Muscle weakness (generalized)  Abnormal posture  Acute pain of right shoulder     Problem List Patient Active Problem  List   Diagnosis Date Noted  . Nephrolithiasis 03/21/2020  . Family history of uterine cancer   . Family history of cancer   . Port-A-Cath in place 12/28/2019  . Hematuria 12/18/2019  . Counseling regarding advance care planning and goals of care 10/12/2019  . Malignant neoplasm of tonsillar fossa ([place]) 09/11/2019  . S/P tonsillectomy 08/21/2019  . Squamous cell carcinoma of head and neck ([place]) 08/10/2019  . Morbid obesity ([place]) 06/08/2019  . Moderate mixed hyperlipidemia not requiring statin therapy 06/08/2019  . OSA (obstructive sleep apnea) 06/08/2019  . Change in stool 02/22/2017  . Gout 02/22/2017   Teena Irani, PTA/CLT [phone]  Roseanne Reno B 05/02/2020, 3:03 PM  [facility] [place], [place], [address] Phone: [phone]   Fax:  [phone]  Name: Dustin Crate Sr. MRN: [id number] Date of Birth: 1965-06-29

## 2020-05-07 ENCOUNTER — Other Ambulatory Visit: Payer: Self-pay

## 2020-05-07 ENCOUNTER — Encounter (HOSPITAL_COMMUNITY): Payer: Self-pay

## 2020-05-07 ENCOUNTER — Ambulatory Visit (HOSPITAL_COMMUNITY): Payer: 59

## 2020-05-07 DIAGNOSIS — M25611 Stiffness of right shoulder, not elsewhere classified: Secondary | ICD-10-CM

## 2020-05-07 DIAGNOSIS — M25511 Pain in right shoulder: Secondary | ICD-10-CM

## 2020-05-07 DIAGNOSIS — M436 Torticollis: Secondary | ICD-10-CM | POA: Diagnosis not present

## 2020-05-07 DIAGNOSIS — M6281 Muscle weakness (generalized): Secondary | ICD-10-CM

## 2020-05-07 DIAGNOSIS — R293 Abnormal posture: Secondary | ICD-10-CM

## 2020-05-07 NOTE — Addendum Note (Signed)
Addended by: Bella Kennedy on: 05/07/2020 09:13 AM   Modules accepted: Orders

## 2020-05-07 NOTE — Therapy (Signed)
[place], [place], [address] Phone: [phone]   Fax:  [phone]  Physical Therapy Treatment  Patient Details  Name: Dustin Panther Sr. MRN: [id number] Date of Birth: August 14, 1965 Referring Provider (PT): Dr. Irene Limbo   Encounter Date: 05/07/2020   PT End of Session - 05/07/20 1455    Visit Number 22    Number of Visits 28   25 visits in 2021   Date for PT Re-Evaluation 05/28/20    Authorization Type UHC    Authorization Time Period Visit limit 30- 5 already used    Progress Note Due on Visit 28    PT Start Time 1450    PT Stop Time 1528    PT Time Calculation (min) 38 min    Activity Tolerance Patient tolerated treatment well    Behavior During Therapy WFL for tasks assessed/performed           Past Medical History:  Diagnosis Date  . Arthritis   . Change in stool 02/22/2017  . Family history of cancer   . Family history of uterine cancer   . Gout 02/22/2017  . Hyperlipidemia   . Sleep apnea    Uses CPAP  . Tonsil cancer [facility]) DX'd 07/2019   metastatic    Past Surgical History:  Procedure Laterality Date  . CARPAL TUNNEL RELEASE Right 04/10/2019  . DIRECT LARYNGOSCOPY N/A 08/21/2019   Procedure: DIRECT LARYNGOSCOPY - WITH BX;  Surgeon: [name], MD;  Location: [place];  Service: ENT;  Laterality: N/A;  . IR GASTROSTOMY TUBE MOD SED  10/13/2019  . IR IMAGING GUIDED PORT INSERTION  10/13/2019  . NECK DISSECTION  09/11/2019  . RADICAL NECK DISSECTION Right 09/11/2019   Procedure: RADICAL NECK DISSECTION;  Surgeon: [name], MD;  Location: [place];  Service: ENT;  Laterality: Right;  . TONSILLECTOMY Bilateral 08/21/2019   Procedure: TONSILLECTOMY;  Surgeon: [name], MD;  Location: [place];  Service: ENT;  Laterality: Bilateral;  . WISDOM TOOTH EXTRACTION      There were no vitals filed for this visit.   Subjective Assessment - 05/07/20 1454    Subjective Pt  stated he feels good, did some leaves and gutters this moning and feels a twing in neck on occasion    Pertinent History Rt tonsil cancer post tonsillectomy and adnoidectomy 08/21/19 with scheduled neck dissection on 09/11/19.  Radiation and chemotherapy completed July 22,2021  Still has a feeding tube because he has no appetite.    Patient Stated Goals Improve neck motion so he can see to back up when driving,, improve Right shoulder ROM for reaching    Currently in Pain? No/denies                             [facility] Adult PT Treatment/Exercise - 05/07/20 0001      Posture/Postural Control   Posture/Postural Control Postural limitations    Postural Limitations Rounded Shoulders;Forward head;Increased thoracic kyphosis      Exercises   Exercises Shoulder      Neck Exercises: Standing   Upper Extremity Flexion with Stabilization Flexion;10 reps    Other Standing Exercises placing sash over opposite shoulder 10x both directions; shoulder press with PVC pipe; standing flexion and scaption AAROM    Other Standing Exercises 10" holds forward flexion, abduction, IR, ER, CW,CCW in 90 degree abduction; RTB horizontal abd 3x 30" at 90 degrees  Neck Exercises: Seated   Other Seated Exercise contract/relax for cervical rotation 5x 10" holds                       PT Long Term Goals - 04/30/20 1047      PT LONG TERM GOAL #1   Title Pt will have active shoulder flexion and scaption atleast 130 degrees for improved reaching ability    Baseline 12/21:  AROM improving, not met with scaption, difficult to acheive flexion    Status On-going      PT LONG TERM GOAL #2   Title Pt will note improved ability to look over shoulder to back up while driving without turning his entire body    Baseline 04/30/20:  Reports compensation with rotation while turning    Status On-going      PT LONG TERM GOAL #3   Title pt will demonstrate ability to open mouth to 25 mm or greater  for improved ability to eat    Status Achieved      PT LONG TERM GOAL #4   Title Right shoulder strength atleast 4-4+/5 for improved use of right UE    Baseline 04/30/20: Weak shoulder strength    Status On-going                 Plan - 05/07/20 1548    Clinical Impression Statement Continued with session focus with Rt shoulder ROM/strength.  Added PNF activities to improve reaching posterior neck and putting on backpack following reports of difficulty.  Visual UE fatigue noted during all activities, able to hold at 90 degrees prior need for body compensation due to weakness.  Verbal cueing and visual feedback assisted with form.    Personal Factors and Comorbidities Comorbidity 2    Comorbidities Modified radical neck dissection with spinal accessory nerve not preserved, post chemo and radiation    Examination-Activity Limitations Reach Overhead;Self Feeding;Dressing    Examination-Participation Restrictions Occupation;Driving;Other    Stability/Clinical Decision Making Stable/Uncomplicated    Clinical Decision Making Low    Rehab Potential Excellent    PT Frequency 2x / week    PT Duration 4 weeks    PT Treatment/Interventions ADLs/Self Care Home Management;Neuromuscular re-education;Passive range of motion;Manual techniques;Manual lymph drainage;Patient/family education;Therapeutic exercise;Therapeutic activities    PT Next Visit Plan Continue to improve Rt shoulder and cervical ROM and strengthening.  Continue contract/relax to improve cervical mobility.    PT Home Exercise Plan Post op cervical exs; Cervical rot and SB with 5 sec hold, gentle UT stretches, cervical and scapular retraction:  10/20:  Shoulder wand exercises to improve ROM; 10/27: table slides for flexion and abduction; 11/5: instructed to complete wand exercises sitting; 11/8 1-8 self manual techniques for chest lymphedema11/19: shoulder self jt mobs for ant,inferior and posterior capsule.           Patient will  benefit from skilled therapeutic intervention in order to improve the following deficits and impairments:  Increased fascial restricitons,Decreased mobility,Decreased scar mobility,Postural dysfunction,Decreased activity tolerance,Decreased range of motion,Decreased strength,Impaired UE functional use,Decreased knowledge of precautions,Increased edema  Visit Diagnosis: Neck stiffness  Muscle weakness (generalized)  Abnormal posture  Acute pain of right shoulder  Stiffness of right shoulder, not elsewhere classified     Problem List Patient Active Problem List   Diagnosis Date Noted  . Nephrolithiasis 03/21/2020  . Family history of uterine cancer   . Family history of cancer   . Port-A-Cath in place 12/28/2019  . Hematuria 12/18/2019  .  Counseling regarding advance care planning and goals of care 10/12/2019  . Malignant neoplasm of tonsillar fossa ([place]) 09/11/2019  . S/P tonsillectomy 08/21/2019  . Squamous cell carcinoma of head and neck ([place]) 08/10/2019  . Morbid obesity ([place]) 06/08/2019  . Moderate mixed hyperlipidemia not requiring statin therapy 06/08/2019  . OSA (obstructive sleep apnea) 06/08/2019  . Change in stool 02/22/2017  . Gout 02/22/2017   Ihor Austin, LPTA/CLT; CBIS [phone]  Aldona Lento 05/07/2020, 6:35 PM  [facility] [address] [place], [place], [address] Phone: [phone]   Fax:  [phone]  Name: Dustin Tornow Sr. MRN: [id number] Date of Birth: [birth year]

## 2020-05-09 ENCOUNTER — Other Ambulatory Visit: Payer: Self-pay

## 2020-05-09 ENCOUNTER — Other Ambulatory Visit: Payer: 59

## 2020-05-09 ENCOUNTER — Ambulatory Visit (HOSPITAL_COMMUNITY): Payer: 59

## 2020-05-09 ENCOUNTER — Ambulatory Visit: Payer: 59 | Admitting: Hematology

## 2020-05-09 DIAGNOSIS — M6281 Muscle weakness (generalized): Secondary | ICD-10-CM

## 2020-05-09 DIAGNOSIS — M25511 Pain in right shoulder: Secondary | ICD-10-CM

## 2020-05-09 DIAGNOSIS — M436 Torticollis: Secondary | ICD-10-CM

## 2020-05-09 DIAGNOSIS — R293 Abnormal posture: Secondary | ICD-10-CM

## 2020-05-09 DIAGNOSIS — M25611 Stiffness of right shoulder, not elsewhere classified: Secondary | ICD-10-CM

## 2020-05-09 NOTE — Therapy (Signed)
[name], [place], [address] Phone: [phone]   Fax:  [phone]  Physical Therapy Treatment  Patient Details  Name: Dustin Balingit Sr. MRN: [id number] Date of Birth: 01/24/1966 Referring Provider (PT): Dr. Irene Limbo   Encounter Date: 05/09/2020   PT End of Session - 05/09/20 0928    Visit Number 23    Number of Visits 28   25 for 2021   Date for PT Re-Evaluation 05/28/20    Authorization Type UHC    Authorization Time Period Visit limit 30- 5 already used    Progress Note Due on Visit 28    PT Start Time 0917    PT Stop Time 1000    PT Time Calculation (min) 43 min    Activity Tolerance Patient tolerated treatment well    Behavior During Therapy [facility] for tasks assessed/performed           Past Medical History:  Diagnosis Date  . Arthritis   . Change in stool 02/22/2017  . Family history of cancer   . Family history of uterine cancer   . Gout 02/22/2017  . Hyperlipidemia   . Sleep apnea    Uses CPAP  . Tonsil cancer [facility]) DX'd 07/2019   metastatic    Past Surgical History:  Procedure Laterality Date  . CARPAL TUNNEL RELEASE Right 04/10/2019  . DIRECT LARYNGOSCOPY N/A 08/21/2019   Procedure: DIRECT LARYNGOSCOPY - WITH BX;  Surgeon: [name], MD;  Location: [place];  Service: ENT;  Laterality: N/A;  . IR GASTROSTOMY TUBE MOD SED  10/13/2019  . IR IMAGING GUIDED PORT INSERTION  10/13/2019  . NECK DISSECTION  09/11/2019  . RADICAL NECK DISSECTION Right 09/11/2019   Procedure: RADICAL NECK DISSECTION;  Surgeon: [name], MD;  Location: [place];  Service: ENT;  Laterality: Right;  . TONSILLECTOMY Bilateral 08/21/2019   Procedure: TONSILLECTOMY;  Surgeon: [name], MD;  Location: [place];  Service: ENT;  Laterality: Bilateral;  . WISDOM TOOTH EXTRACTION      There were no vitals filed for this visit.   Subjective Assessment - 05/09/20 0924    Subjective Pt  stated he is feeling good, feeling a little achey from doing a lot of yard work yesterday.    Pertinent History Rt tonsil cancer post tonsillectomy and adnoidectomy 08/21/19 with scheduled neck dissection on 09/11/19.  Radiation and chemotherapy completed July 22,2021  Still has a feeding tube because he has no appetite.    Patient Stated Goals Improve neck motion so he can see to back up when driving,, improve Right shoulder ROM for reaching    Currently in Pain? No/denies                             [facility] Adult PT Treatment/Exercise - 05/09/20 0001      Posture/Postural Control   Posture/Postural Control Postural limitations    Postural Limitations Rounded Shoulders;Forward head;Increased thoracic kyphosis      Neck Exercises: Standing   Neck Retraction Limitations scapular retract/protract (difficult keeping elbow extended) tactile and verbal ceuing    Wall Push Ups 15 reps    Wall Push Ups Limitations washcloth to address limited extension in wrist    Upper Extremity Flexion with Stabilization Flexion;10 reps    Other Standing Exercises placing sash over opposite shoulder 10x both directions; standing flexion and scaption AAROM    Other Standing Exercises  10" holds forward flexion, abduction, IR, ER, CW,CCW in 90 degree abduction; RTB horizontal abd 3x 30" at 90 degrees      Shoulder Exercises: Supine   Protraction Limitations scapular retract/protract (difficult keeping elbow extended) tactile and verbal ceuing    Flexion AAROM;10 reps    Other Supine Exercises AAROM scaption/ abd 5x 2      Shoulder Exercises: Seated   Extension 15 reps;Theraband    Theraband Level (Shoulder Extension) Level 3 (Green)    Extension Limitations 5" holds    Row 15 reps    Theraband Level (Shoulder Row) Level 3 (Green)    Row Limitations 5" holds    Other Seated Exercises contract/relax for cervical rotation and sidebend 5x each    Other Seated Exercises 3D cervical excursion                        PT Long Term Goals - 04/30/20 1047      PT LONG TERM GOAL #1   Title Pt will have active shoulder flexion and scaption atleast 130 degrees for improved reaching ability    Baseline 12/21:  AROM improving, not met with scaption, difficult to acheive flexion    Status On-going      PT LONG TERM GOAL #2   Title Pt will note improved ability to look over shoulder to back up while driving without turning his entire body    Baseline 04/30/20:  Reports compensation with rotation while turning    Status On-going      PT LONG TERM GOAL #3   Title pt will demonstrate ability to open mouth to 25 mm or greater for improved ability to eat    Status Achieved      PT LONG TERM GOAL #4   Title Right shoulder strength atleast 4-4+/5 for improved use of right UE    Baseline 04/30/20: Weak shoulder strength    Status On-going                 Plan - 05/09/20 1352    Clinical Impression Statement Pt easily fatigued with Rt shoulder strengthening exercises.  Able to acheive ~100 scaption and 120 degrees flexion prior need for AAROM, cueing to reduce compensation with sidebending and lordacic curvature.  Increased focus wiht cervical mobility with additional contract/relax iwth sidebending as well as scapular mobility and strengthening.  Added theraband exercises to HEP, pt able to demonstrate appropriate mechanics and given printout for followthru at home.    Personal Factors and Comorbidities Comorbidity 2    Comorbidities Modified radical neck dissection with spinal accessory nerve not preserved, post chemo and radiation    Examination-Activity Limitations Reach Overhead;Self Feeding;Dressing    Examination-Participation Restrictions Occupation;Driving;Other    Stability/Clinical Decision Making Stable/Uncomplicated    Clinical Decision Making Low    Rehab Potential Excellent    PT Frequency 2x / week    PT Duration 4 weeks    PT Treatment/Interventions ADLs/Self  Care Home Management;Neuromuscular re-education;Passive range of motion;Manual techniques;Manual lymph drainage;Patient/family education;Therapeutic exercise;Therapeutic activities    PT Next Visit Plan Continue to improve Rt shoulder and cervical ROM and strengthening.  Continue contract/relax to improve cervical mobility.    PT Home Exercise Plan Post op cervical exs; Cervical rot and SB with 5 sec hold, gentle UT stretches, cervical and scapular retraction:  10/20:  Shoulder wand exercises to improve ROM; 10/27: table slides for flexion and abduction; 11/5: instructed to complete wand exercises sitting; 11/8 1-8  self manual techniques for chest lymphedema11/19: shoulder self jt mobs for ant,inferior and posterior capsule. 12/30: GTB row and shoulder extension.           Patient will benefit from skilled therapeutic intervention in order to improve the following deficits and impairments:  Increased fascial restricitons,Decreased mobility,Decreased scar mobility,Postural dysfunction,Decreased activity tolerance,Decreased range of motion,Decreased strength,Impaired UE functional use,Decreased knowledge of precautions,Increased edema  Visit Diagnosis: Muscle weakness (generalized)  Neck stiffness  Abnormal posture  Acute pain of right shoulder  Stiffness of right shoulder, not elsewhere classified     Problem List Patient Active Problem List   Diagnosis Date Noted  . Nephrolithiasis 03/21/2020  . Family history of uterine cancer   . Family history of cancer   . Port-A-Cath in place 12/28/2019  . Hematuria 12/18/2019  . Counseling regarding advance care planning and goals of care 10/12/2019  . Malignant neoplasm of tonsillar fossa ([place]) 09/11/2019  . S/P tonsillectomy 08/21/2019  . Squamous cell carcinoma of head and neck ([place]) 08/10/2019  . Morbid obesity ([place]) 06/08/2019  . Moderate mixed hyperlipidemia not requiring statin therapy 06/08/2019  . OSA (obstructive sleep apnea)  06/08/2019  . Change in stool 02/22/2017  . Gout 02/22/2017  Ihor Austin, LPTA/CLT; CBIS [phone]  Aldona Lento 05/09/2020, 1:56 PM  [facility] [address] [place], [place], [address] Phone: [phone]   Fax:  [phone]  Name: Dustin Maceachern Sr. MRN: [id number] Date of Birth: Sep 28, 1965

## 2020-05-14 ENCOUNTER — Other Ambulatory Visit: Payer: Self-pay

## 2020-05-14 ENCOUNTER — Encounter (HOSPITAL_COMMUNITY): Payer: Self-pay

## 2020-05-14 ENCOUNTER — Ambulatory Visit (HOSPITAL_COMMUNITY): Payer: 59 | Attending: Hematology

## 2020-05-14 DIAGNOSIS — M436 Torticollis: Secondary | ICD-10-CM | POA: Diagnosis present

## 2020-05-14 DIAGNOSIS — M25611 Stiffness of right shoulder, not elsewhere classified: Secondary | ICD-10-CM | POA: Insufficient documentation

## 2020-05-14 DIAGNOSIS — M6281 Muscle weakness (generalized): Secondary | ICD-10-CM | POA: Insufficient documentation

## 2020-05-14 DIAGNOSIS — R293 Abnormal posture: Secondary | ICD-10-CM | POA: Diagnosis present

## 2020-05-14 DIAGNOSIS — M25511 Pain in right shoulder: Secondary | ICD-10-CM | POA: Insufficient documentation

## 2020-05-14 NOTE — Therapy (Addendum)
[place], [place], [address] Phone: [phone]   Fax:  [phone]  Physical Therapy Treatment  Patient Details  Name: Dustin Corning Sr. MRN: [id number] Date of Birth: 1965-05-31 Referring Provider (PT): Dr. Irene Limbo   Encounter Date: 05/14/2020   PT End of Session - 05/14/20 0839    Visit Number 24    Number of Visits 28    Date for PT Re-Evaluation 05/28/20    Authorization Type UHC    Authorization Time Period Visit limit 30    Progress Note Due on Visit 28    PT Start Time 0832    PT Stop Time 0915    PT Time Calculation (min) 43 min    Activity Tolerance Patient tolerated treatment well    Behavior During Therapy [facility] for tasks assessed/performed           Past Medical History:  Diagnosis Date  . Arthritis   . Change in stool 02/22/2017  . Family history of cancer   . Family history of uterine cancer   . Gout 02/22/2017  . Hyperlipidemia   . Sleep apnea    Uses CPAP  . Tonsil cancer [facility]) DX'd 07/2019   metastatic    Past Surgical History:  Procedure Laterality Date  . CARPAL TUNNEL RELEASE Right 04/10/2019  . DIRECT LARYNGOSCOPY N/A 08/21/2019   Procedure: DIRECT LARYNGOSCOPY - WITH BX;  Surgeon: Dustin Gala, MD;  Location: [place];  Service: ENT;  Laterality: N/A;  . IR GASTROSTOMY TUBE MOD SED  10/13/2019  . IR IMAGING GUIDED PORT INSERTION  10/13/2019  . NECK DISSECTION  09/11/2019  . RADICAL NECK DISSECTION Right 09/11/2019   Procedure: RADICAL NECK DISSECTION;  Surgeon: Dustin Gala, MD;  Location: [place];  Service: ENT;  Laterality: Right;  . TONSILLECTOMY Bilateral 08/21/2019   Procedure: TONSILLECTOMY;  Surgeon: Dustin Gala, MD;  Location: [place];  Service: ENT;  Laterality: Bilateral;  . WISDOM TOOTH EXTRACTION      There were no vitals filed for this visit.   Subjective Assessment - 05/14/20 0835    Subjective Pt with issues wiht his peg tube today.   Continues to c/o difficulty with sidebending and extension wiht the neck.    Pertinent History Rt tonsil cancer post tonsillectomy and adnoidectomy 08/21/19 with scheduled neck dissection on 09/11/19.  Radiation and chemotherapy completed July 22,2021  Still has a feeding tube because he has no appetite.    Patient Stated Goals Improve neck motion so he can see to back up when driving,, improve Right shoulder ROM for reaching    Currently in Pain? No/denies    Pain Score 1     Pain Location Abdomen              [facility] PT Assessment - 05/14/20 0001      Assessment   Referring Provider (PT) Dr. Irene Limbo    Onset Date/Surgical Date 08/23/19    Hand Dominance Right;Left    Next MD Visit 05/28/20 cancer center    Prior Therapy swallowing      Precautions   Precaution Comments lymphedema                         [facility] Adult PT Treatment/Exercise - 05/14/20 0001      Posture/Postural Control   Posture/Postural Control Postural limitations    Postural Limitations Rounded Shoulders;Forward head;Increased thoracic kyphosis  Neck Exercises: Standing   Upper Extremity Flexion with Stabilization Flexion;10 reps   AAROM at end range   Other Standing Exercises abduction with AAROM end range 10" holds coming down      Shoulder Exercises: Supine   Other Supine Exercises Cervical extension off bed x 2 min      Shoulder Exercises: Seated   Other Seated Exercises contract/relax for cervical rotation and sidebend 5x each    Other Seated Exercises towel assisted cervical rotation then extension      Manual Therapy   Manual Therapy Other (comment)    Manual therapy comments Manual complete separate than rest of tx    Other Manual Therapy Suboccipital release x 2 min                       PT Long Term Goals - 04/30/20 1047      PT LONG TERM GOAL #1   Title Pt will have active shoulder flexion and scaption atleast 130 degrees for improved reaching ability     Baseline 12/21:  AROM improving, not met with scaption, difficult to acheive flexion    Status On-going      PT LONG TERM GOAL #2   Title Pt will note improved ability to look over shoulder to back up while driving without turning his entire body    Baseline 04/30/20:  Reports compensation with rotation while turning    Status On-going      PT LONG TERM GOAL #3   Title pt will demonstrate ability to open mouth to 25 mm or greater for improved ability to eat    Status Achieved      PT LONG TERM GOAL #4   Title Right shoulder strength atleast 4-4+/5 for improved use of right UE    Baseline 04/30/20: Weak shoulder strength    Status On-going                 Plan - 05/14/20 0916    Clinical Impression Statement Increased focus wiht cervical mobility this session.  Added self assisted ROM exercises wiht towel, printout given wiht additional HEP.  Continued with shoulder strengthening exercises, continues to require AAROM following 100 degree flexion/abd due to fatigue.  Pt c/o new jaw pain, encouraged to discuss with MD.    Personal Factors and Comorbidities Comorbidity 2    Comorbidities Modified radical neck dissection with spinal accessory nerve not preserved, post chemo and radiation    Examination-Activity Limitations Reach Overhead;Self Feeding;Dressing    Examination-Participation Restrictions Occupation;Driving;Other    Stability/Clinical Decision Making Stable/Uncomplicated    Clinical Decision Making Low    Rehab Potential Excellent    PT Frequency 2x / week    PT Duration 4 weeks    PT Treatment/Interventions ADLs/Self Care Home Management;Neuromuscular re-education;Passive range of motion;Manual techniques;Manual lymph drainage;Patient/family education;Therapeutic exercise;Therapeutic activities    PT Next Visit Plan Continue to improve Rt shoulder and cervical ROM and strengthening.  Continue contract/relax to improve cervical mobility.    PT Home Exercise Plan Post op  cervical exs; Cervical rot and SB with 5 sec hold, gentle UT stretches, cervical and scapular retraction:  10/20:  Shoulder wand exercises to improve ROM; 10/27: table slides for flexion and abduction; 11/5: instructed to complete wand exercises sitting; 11/8 1-8 self manual techniques for chest lymphedema11/19: shoulder self jt mobs for ant,inferior and posterior capsule. 12/30: GTB row and shoulder extension.           Patient will  benefit from skilled therapeutic intervention in order to improve the following deficits and impairments:  Increased fascial restricitons,Decreased mobility,Decreased scar mobility,Postural dysfunction,Decreased activity tolerance,Decreased range of motion,Decreased strength,Impaired UE functional use,Decreased knowledge of precautions,Increased edema  Visit Diagnosis: Muscle weakness (generalized)  Neck stiffness  Abnormal posture  Acute pain of right shoulder  Stiffness of right shoulder, not elsewhere classified     Problem List Patient Active Problem List   Diagnosis Date Noted  . Nephrolithiasis 03/21/2020  . Family history of uterine cancer   . Family history of cancer   . Port-A-Cath in place 12/28/2019  . Hematuria 12/18/2019  . Counseling regarding advance care planning and goals of care 10/12/2019  . Malignant neoplasm of tonsillar fossa ([place]) 09/11/2019  . S/P tonsillectomy 08/21/2019  . Squamous cell carcinoma of head and neck ([place]) 08/10/2019  . Morbid obesity ([place]) 06/08/2019  . Moderate mixed hyperlipidemia not requiring statin therapy 06/08/2019  . OSA (obstructive sleep apnea) 06/08/2019  . Change in stool 02/22/2017  . Gout 02/22/2017   Ihor Austin, LPTA/CLT; CBIS [phone]  Aldona Lento 05/14/2020, 12:54 PM  [facility] [address] [place], [place], [address] Phone: [phone]   Fax:  [phone]  Name: Dustin Klas Sr. MRN: [id number] Date of Birth:  Jun 08, 1965

## 2020-05-16 ENCOUNTER — Encounter (HOSPITAL_COMMUNITY): Payer: Self-pay

## 2020-05-16 ENCOUNTER — Ambulatory Visit (HOSPITAL_COMMUNITY): Payer: 59

## 2020-05-16 ENCOUNTER — Other Ambulatory Visit: Payer: Self-pay

## 2020-05-16 DIAGNOSIS — R293 Abnormal posture: Secondary | ICD-10-CM

## 2020-05-16 DIAGNOSIS — M6281 Muscle weakness (generalized): Secondary | ICD-10-CM

## 2020-05-16 DIAGNOSIS — M436 Torticollis: Secondary | ICD-10-CM

## 2020-05-16 DIAGNOSIS — M25611 Stiffness of right shoulder, not elsewhere classified: Secondary | ICD-10-CM

## 2020-05-16 DIAGNOSIS — M25511 Pain in right shoulder: Secondary | ICD-10-CM

## 2020-05-16 NOTE — Therapy (Signed)
[place], [place], [address] Phone: [phone]   Fax:  [phone]  Physical Therapy Treatment  Patient Details  Name: Dustin Collister Sr. MRN: [id number] Date of Birth: [birth year] Referring Provider (PT): Dr. [name]   Encounter Date: 05/16/2020   PT End of Session - 05/16/20 0833    Visit Number 25    Number of Visits 28    Date for PT Re-Evaluation 05/28/20    Authorization Type UHC    Authorization Time Period Visit limit 30    Progress Note Due on Visit 28    PT Start Time 0829    PT Stop Time 0910    PT Time Calculation (min) 41 min    Activity Tolerance Patient tolerated treatment well    Behavior During Therapy [facility] for tasks assessed/performed           Past Medical History:  Diagnosis Date   Arthritis    Change in stool 02/22/2017   Family history of cancer    Family history of uterine cancer    Gout 02/22/2017   Hyperlipidemia    Sleep apnea    Uses CPAP   Tonsil cancer ([place]) DX'd 07/2019   metastatic    Past Surgical History:  Procedure Laterality Date   CARPAL TUNNEL RELEASE Right 04/10/2019   DIRECT LARYNGOSCOPY N/A 08/21/2019   Procedure: DIRECT LARYNGOSCOPY - WITH BX;  Surgeon: [name], MD;  Location: [place];  Service: ENT;  Laterality: N/A;   IR GASTROSTOMY TUBE MOD SED  10/13/2019   IR IMAGING GUIDED PORT INSERTION  10/13/2019   NECK DISSECTION  09/11/2019   RADICAL NECK DISSECTION Right 09/11/2019   Procedure: RADICAL NECK DISSECTION;  Surgeon: [name], MD;  Location: [place];  Service: ENT;  Laterality: Right;   TONSILLECTOMY Bilateral 08/21/2019   Procedure: TONSILLECTOMY;  Surgeon: [name], MD;  Location: [place];  Service: ENT;  Laterality: Bilateral;   WISDOM TOOTH EXTRACTION      There were no vitals filed for this visit.   Subjective Assessment - 05/16/20 0832    Subjective Pt stated he is feeling good today, no  reports of pain.  Reports he has added new towel rotation stretches to HEP, continues to feel tight scar tissue around neck.    Patient Stated Goals Improve neck motion so he can see to back up when driving,, improve Right shoulder ROM for reaching    Currently in Pain? No/denies              [facility] PT Assessment - 05/16/20 0001      Assessment   Referring Provider (PT) Dr. [name]    Onset Date/Surgical Date 08/23/19    Hand Dominance Right;Left    Next MD Visit 05/21/20: ear, nose and throat; 05/28/20 cancer center    Prior Therapy swallowing      Precautions   Precaution Comments lymphedema                         [facility] Adult PT Treatment/Exercise - 05/16/20 0001      Posture/Postural Control   Posture/Postural Control Postural limitations    Postural Limitations Rounded Shoulders;Forward head;Increased thoracic kyphosis      Exercises   Exercises Shoulder      Shoulder Exercises: Supine   Flexion 10 reps    Shoulder Flexion Weight (lbs) 3#    Flexion Limitations controlled return pausing every  15 degrees      Shoulder Exercises: Prone   Other Prone Exercises Quadruped weight shifting.  Flexion 3x, horizontal abd 3x      Shoulder Exercises: Sidelying   External Rotation 10 reps;Weights    External Rotation Weight (lbs) 3#    External Rotation Limitations controlled return    Flexion --    Flexion Weight (lbs) --    Flexion Limitations --    ABduction 10 reps    ABduction Weight (lbs) 3#    ABduction Limitations 5" holds upon return every 15degrees    Other Sidelying Exercises cervical sidebend/rotation      Manual Therapy   Other Manual Therapy Suboccipital release x 2 min                       PT Long Term Goals - 04/30/20 1047      PT LONG TERM GOAL #1   Title Pt will have active shoulder flexion and scaption atleast 130 degrees for improved reaching ability    Baseline 12/21:  AROM improving, not met with scaption, difficult to  acheive flexion    Status On-going      PT LONG TERM GOAL #2   Title Pt will note improved ability to look over shoulder to back up while driving without turning his entire body    Baseline 04/30/20:  Reports compensation with rotation while turning    Status On-going      PT LONG TERM GOAL #3   Title pt will demonstrate ability to open mouth to 25 mm or greater for improved ability to eat    Status Achieved      PT LONG TERM GOAL #4   Title Right shoulder strength atleast 4-4+/5 for improved use of right UE    Baseline 04/30/20: Weak shoulder strength    Status On-going                 Plan - 05/16/20 0844    Clinical Impression Statement Session focus with cervical mobility and Rt shoulder strengthening.  Began sidelying cervical exercises with some fatigue noted against gravity.  Rt shoulder strengthening exercises complete with 3# in sidelying as well as quadruped activities for weight bearing exercises, cueing for controlled return for strengthening.  Pt with difficulty in quadruped activities due to weakness.    Personal Factors and Comorbidities Comorbidity 2    Comorbidities Modified radical neck dissection with spinal accessory nerve not preserved, post chemo and radiation    Examination-Activity Limitations Reach Overhead;Self Feeding;Dressing    Examination-Participation Restrictions Occupation;Driving;Other    Stability/Clinical Decision Making Stable/Uncomplicated    Clinical Decision Making Low    Rehab Potential Excellent    PT Frequency 2x / week    PT Duration 4 weeks    PT Treatment/Interventions ADLs/Self Care Home Management;Neuromuscular re-education;Passive range of motion;Manual techniques;Manual lymph drainage;Patient/family education;Therapeutic exercise;Therapeutic activities    PT Next Visit Plan Continue to improve Rt shoulder and cervical ROM and strengthening.  Continue contract/relax to improve cervical mobility.    PT Home Exercise Plan Post op  cervical exs; Cervical rot and SB with 5 sec hold, gentle UT stretches, cervical and scapular retraction:  10/20:  Shoulder wand exercises to improve ROM; 10/27: table slides for flexion and abduction; 11/5: instructed to complete wand exercises sitting; 11/8 1-8 self manual techniques for chest lymphedema11/19: shoulder self jt mobs for ant,inferior and posterior capsule. 12/30: GTB row and shoulder extension.  Patient will benefit from skilled therapeutic intervention in order to improve the following deficits and impairments:  Increased fascial restricitons,Decreased mobility,Decreased scar mobility,Postural dysfunction,Decreased activity tolerance,Decreased range of motion,Decreased strength,Impaired UE functional use,Decreased knowledge of precautions,Increased edema  Visit Diagnosis: Muscle weakness (generalized)  Neck stiffness  Abnormal posture  Acute pain of right shoulder  Stiffness of right shoulder, not elsewhere classified     Problem List Patient Active Problem List   Diagnosis Date Noted   Nephrolithiasis 03/21/2020   Family history of uterine cancer    Family history of cancer    Port-A-Cath in place 12/28/2019   Hematuria 12/18/2019   Counseling regarding advance care planning and goals of care 10/12/2019   Malignant neoplasm of tonsillar fossa ([place]) 09/11/2019   S/P tonsillectomy 08/21/2019   Squamous cell carcinoma of head and neck ([place]) 08/10/2019   Morbid obesity ([place]) 06/08/2019   Moderate mixed hyperlipidemia not requiring statin therapy 06/08/2019   OSA (obstructive sleep apnea) 06/08/2019   Change in stool 02/22/2017   Gout 02/22/2017   Ihor Austin, LPTA/CLT; CBIS [phone]  Aldona Lento 05/16/2020, 9:20 AM  [facility] [place] [address] [place], [place], [address] Phone: [phone]   Fax:  [phone]  Name: Dustin Reinders Sr. MRN: [id number] Date of Birth:  [birth year]

## 2020-05-21 ENCOUNTER — Other Ambulatory Visit: Payer: Self-pay

## 2020-05-21 ENCOUNTER — Encounter (HOSPITAL_COMMUNITY): Payer: Self-pay

## 2020-05-21 ENCOUNTER — Ambulatory Visit (HOSPITAL_COMMUNITY): Payer: 59

## 2020-05-21 DIAGNOSIS — M25511 Pain in right shoulder: Secondary | ICD-10-CM

## 2020-05-21 DIAGNOSIS — M6281 Muscle weakness (generalized): Secondary | ICD-10-CM

## 2020-05-21 DIAGNOSIS — M25611 Stiffness of right shoulder, not elsewhere classified: Secondary | ICD-10-CM

## 2020-05-21 DIAGNOSIS — R293 Abnormal posture: Secondary | ICD-10-CM

## 2020-05-21 DIAGNOSIS — M436 Torticollis: Secondary | ICD-10-CM

## 2020-05-21 NOTE — Therapy (Addendum)
[place], [place], [address] Phone: [phone]   Fax:  [phone]  Physical Therapy Treatment  Patient Details  Name: Dustin Kwan Sr. MRN: [id number] Date of Birth: April 20, 1966 Referring Provider (PT): Dr. [name]  PHYSICAL THERAPY DISCHARGE SUMMARY  Visits from Start of Care: 26  Current functional level related to goals / functional outcomes: PT improved ROM with AA functional, strength limited    Remaining deficits: Strength    Education / Equipment: HEP: explained that strength will be slow; pt knows the exercises and is to continue on his own.  Plan: Patient agrees to discharge.  Patient goals were partially met. Patient is being discharged due to lack of progress.  ?????      Encounter Date: 05/21/2020   PT End of Session - 05/21/20 0937    Visit Number 26    Number of Visits 26    Date for PT Re-Evaluation 05/28/20    Authorization Type [employer]    Authorization Time Period Visit limit 30    Progress Note Due on Visit 26   PT Start Time 0916    PT Stop Time 0954    PT Time Calculation (min) 38 min    Activity Tolerance Patient tolerated treatment well    Behavior During Therapy [facility] for tasks assessed/performed           Past Medical History:  Diagnosis Date  . Arthritis   . Change in stool 02/22/2017  . Family history of cancer   . Family history of uterine cancer   . Gout 02/22/2017  . Hyperlipidemia   . Sleep apnea    Uses CPAP  . Tonsil cancer [facility]) DX'd 07/2019   metastatic    Past Surgical History:  Procedure Laterality Date  . CARPAL TUNNEL RELEASE Right 04/10/2019  . DIRECT LARYNGOSCOPY N/A 08/21/2019   Procedure: DIRECT LARYNGOSCOPY - WITH BX;  Surgeon: Dustin Gala, MD;  Location: [place];  Service: ENT;  Laterality: N/A;  . IR GASTROSTOMY TUBE MOD SED  10/13/2019  . IR IMAGING GUIDED PORT INSERTION  10/13/2019  . NECK DISSECTION  09/11/2019  . RADICAL NECK  DISSECTION Right 09/11/2019   Procedure: RADICAL NECK DISSECTION;  Surgeon: Dustin Gala, MD;  Location: [place];  Service: ENT;  Laterality: Right;  . TONSILLECTOMY Bilateral 08/21/2019   Procedure: TONSILLECTOMY;  Surgeon: Dustin Gala, MD;  Location: [place];  Service: ENT;  Laterality: Bilateral;  . WISDOM TOOTH EXTRACTION      There were no vitals filed for this visit.   Subjective Assessment - 05/21/20 0919    Subjective Pt reports he twisted Rt knee Saturday night wiht some pain today.  Pt feels he has platoed with therapy.  Has apt with surgeon with later today.    Pertinent History Rt tonsil cancer post tonsillectomy and adnoidectomy 08/21/19 with scheduled neck dissection on 09/11/19.  Radiation and chemotherapy completed July 22,2021  Still has a feeding tube because he has no appetite.    Patient Stated Goals Improve neck motion so he can see to back up when driving,, improve Right shoulder ROM for reaching    Currently in Pain? Yes    Pain Score 1     Pain Location Knee   Rt shoulder is stiff today   Pain Orientation Right              [facility] PT Assessment - 05/21/20 0001      Assessment  Referring Provider (PT) Dr. [name]    Onset Date/Surgical Date 08/23/19    Hand Dominance Right;Left    Next MD Visit 05/21/20: ear, nose and throat; 05/28/20 cancer center    Prior Therapy swallowing      Precautions   Precaution Comments lymphedema      ROM / Strength   AROM / PROM / Strength AROM;Strength      AROM   Right Shoulder Extension 45 Degrees   45 sitting was 32   Right Shoulder Flexion 140 Degrees   1/11:140 sitting 160 supine. was 130 sitting, 160 supine  Initial evaluation 100   Right Shoulder ABduction 100 Degrees   1/11/22L: 100 sitting, 170 supine was 90 sititng 165 supine  Initial evaluation 75   Right Shoulder Internal Rotation 70 Degrees   was 70 i   Right Shoulder External Rotation 85 Degrees   1/11: 85 sitting, 90 supinewas 85 initial evaluation  75   Left Shoulder Flexion --   was 150   Left Shoulder ABduction --   was 150   Left Shoulder Internal Rotation --   was 65   Left Shoulder External Rotation --   was 90   Cervical Flexion 55   was 55  Initial evaluation was 40   Cervical Extension 32   was 30  Initial evaluation was 35   Cervical - Right Side Bend 38   was 38  Initial evaluation was 38   Cervical - Left Side Bend 40   was 40  Initial evaluation was 36   Cervical - Right Rotation 55   was 60 Initial evaluation was 39   Cervical - Left Rotation 60   was 53 initial evaluation was 40      Strength   Right Shoulder Flexion 3+/5   was 3-   Right Shoulder Extension 4/5   was 4/5   Right Shoulder ABduction 3-/5   was 3-   Right Shoulder Internal Rotation 3/5   was 3-   Right Shoulder External Rotation 3/5   was 3-   Left Shoulder Flexion 4+/5   was 4+   Left Shoulder Extension 4+/5   was 4+   Left Shoulder ABduction 4+/5   was 4+   Left Shoulder Internal Rotation 4+/5   was 4/5   Left Shoulder External Rotation 4+/5   was 4/5                        [facility] Adult PT Treatment/Exercise - 05/21/20 0001      Posture/Postural Control   Posture/Postural Control Postural limitations    Postural Limitations Rounded Shoulders;Forward head;Increased thoracic kyphosis      Exercises   Exercises Shoulder      Neck Exercises: Machines for Strengthening   UBE (Upper Arm Bike) 2' forward/ 2' backward      Neck Exercises: Seated   Other Seated Exercise 3D cervical excursion10x      Shoulder Exercises: Standing   Flexion AAROM;Both;10 reps    ABduction AAROM;Both;10 reps    Other Standing Exercises wall slides                  PT Education - 05/21/20 1305    Education Details MMT/ROM test findings, reviewed exercises to continue at home    Person(s) Educated Patient    Methods Explanation;Demonstration    Comprehension Verbalized understanding;Returned demonstration  PT Long Term  Goals - 05/21/20 0931      PT LONG TERM GOAL #1   Title Pt will have active shoulder flexion and scaption atleast 130 degrees for improved reaching ability    Baseline 05/21/20: AROM improving, not met with scaption, difficult to acheive flexion      PT LONG TERM GOAL #2   Title Pt will note improved ability to look over shoulder to back up while driving without turning his entire body    Baseline 05/21/20:  REports difficulty looking Rt, improved looking Lt though does continue to compensate with rotation while turning; 04/30/20:  Reports compensation with rotation while turning    Status On-going      PT LONG TERM GOAL #3   Title pt will demonstrate ability to open mouth to 25 mm or greater for improved ability to eat    Status Achieved      PT LONG TERM GOAL #4   Title Right shoulder strength atleast 4-4+/5 for improved use of right UE    Baseline 05/21/20: 04/30/20: Weak shoulder strength                 Plan - 05/21/20 1301    Clinical Impression Statement Reviewed goals, ROM measurement and strength this session following reports of feeling plattoed wiht gains.  Pt with minimal improvements with Rt shoulder mobility and strength.  Reviewed importance of compliance wiht HEP to continue gains following discharge.    Personal Factors and Comorbidities Comorbidity 2    Comorbidities Modified radical neck dissection with spinal accessory nerve not preserved, post chemo and radiation    Examination-Activity Limitations Reach Overhead;Self Feeding;Dressing    Examination-Participation Restrictions Occupation;Driving;Other    Stability/Clinical Decision Making Stable/Uncomplicated    Clinical Decision Making Low    Rehab Potential Excellent    PT Frequency 2x / week    PT Duration 4 weeks    PT Treatment/Interventions ADLs/Self Care Home Management;Neuromuscular re-education;Passive range of motion;Manual techniques;Manual lymph drainage;Patient/family education;Therapeutic  exercise;Therapeutic activities    PT Next Visit Plan DC to HEP    PT Home Exercise Plan Post op cervical exs; Cervical rot and SB with 5 sec hold, gentle UT stretches, cervical and scapular retraction:  10/20:  Shoulder wand exercises to improve ROM; 10/27: table slides for flexion and abduction; 11/5: instructed to complete wand exercises sitting; 11/8 1-8 self manual techniques for chest lymphedema11/19: shoulder self jt mobs for ant,inferior and posterior capsule. 12/30: GTB row and shoulder extension.; 1/11: cervical mobility with towel assistance.           Patient will benefit from skilled therapeutic intervention in order to improve the following deficits and impairments:  Increased fascial restricitons,Decreased mobility,Decreased scar mobility,Postural dysfunction,Decreased activity tolerance,Decreased range of motion,Decreased strength,Impaired UE functional use,Decreased knowledge of precautions,Increased edema  Visit Diagnosis: Muscle weakness (generalized)  Neck stiffness  Abnormal posture  Acute pain of right shoulder  Stiffness of right shoulder, not elsewhere classified     Problem List Patient Active Problem List   Diagnosis Date Noted  . Nephrolithiasis 03/21/2020  . Family history of uterine cancer   . Family history of cancer   . Port-A-Cath in place 12/28/2019  . Hematuria 12/18/2019  . Counseling regarding advance care planning and goals of care 10/12/2019  . Malignant neoplasm of tonsillar fossa (HCC) 09/11/2019  . S/P tonsillectomy 08/21/2019  . Squamous cell carcinoma of head and neck (HCC) 08/10/2019  . Morbid obesity (HCC) 06/08/2019  . Moderate mixed hyperlipidemia not  requiring statin therapy 06/08/2019  . OSA (obstructive sleep apnea) 06/08/2019  . Change in stool 02/22/2017  . Gout 02/22/2017   Ihor Austin, LPTA/CLT; CBIS [phone]  Rayetta Humphrey, PT CLT [phone] 05/21/2020, 1:05 PM  [facility] [address] [place], [place], [address] Phone: [phone]   Fax:  [phone]  Name: Valerie Fredin Sr. MRN: [id number] Date of Birth: 1965/10/13

## 2020-05-23 ENCOUNTER — Encounter (HOSPITAL_COMMUNITY): Payer: 59 | Admitting: Physical Therapy

## 2020-05-28 ENCOUNTER — Inpatient Hospital Stay: Payer: 59

## 2020-05-28 ENCOUNTER — Inpatient Hospital Stay: Payer: 59 | Admitting: Hematology

## 2020-05-28 ENCOUNTER — Other Ambulatory Visit: Payer: Self-pay

## 2020-05-28 ENCOUNTER — Inpatient Hospital Stay: Payer: 59 | Attending: Hematology

## 2020-05-28 ENCOUNTER — Encounter (HOSPITAL_COMMUNITY): Payer: 59

## 2020-05-28 VITALS — BP 131/87 | HR 71 | Temp 97.0°F | Resp 20 | Ht 68.0 in | Wt 250.8 lb

## 2020-05-28 DIAGNOSIS — C099 Malignant neoplasm of tonsil, unspecified: Secondary | ICD-10-CM | POA: Insufficient documentation

## 2020-05-28 DIAGNOSIS — Z7189 Other specified counseling: Secondary | ICD-10-CM

## 2020-05-28 DIAGNOSIS — G4730 Sleep apnea, unspecified: Secondary | ICD-10-CM | POA: Diagnosis not present

## 2020-05-28 DIAGNOSIS — C090 Malignant neoplasm of tonsillar fossa: Secondary | ICD-10-CM

## 2020-05-28 DIAGNOSIS — Z87442 Personal history of urinary calculi: Secondary | ICD-10-CM | POA: Insufficient documentation

## 2020-05-28 LAB — MAGNESIUM: Magnesium: 1.9 mg/dL (ref 1.7–2.4)

## 2020-05-28 LAB — CMP (CANCER CENTER ONLY)
ALT: 15 U/L (ref 0–44)
AST: 16 U/L (ref 15–41)
Albumin: 3.7 g/dL (ref 3.5–5.0)
Alkaline Phosphatase: 66 U/L (ref 38–126)
Anion gap: 8 (ref 5–15)
BUN: 16 mg/dL (ref 6–20)
CO2: 28 mmol/L (ref 22–32)
Calcium: 9.3 mg/dL (ref 8.9–10.3)
Chloride: 103 mmol/L (ref 98–111)
Creatinine: 1.07 mg/dL (ref 0.61–1.24)
GFR, Estimated: >60 mL/min (ref >60)
Glucose, Bld: 94 mg/dL (ref 70–99)
Potassium: 4 mmol/L (ref 3.5–5.1)
Sodium: 139 mmol/L (ref 135–145)
Total Bilirubin: 0.5 mg/dL (ref 0.3–1.2)
Total Protein: 7.1 g/dL (ref 6.5–8.1)

## 2020-05-28 LAB — CBC WITH DIFFERENTIAL/PLATELET
Abs Immature Granulocytes: 0.01 10*3/uL (ref 0.00–0.07)
Basophils Absolute: 0 10*3/uL (ref 0.0–0.1)
Basophils Relative: 0 %
Eosinophils Absolute: 0.2 10*3/uL (ref 0.0–0.5)
Eosinophils Relative: 3 %
HCT: 40.8 % (ref 39.0–52.0)
Hemoglobin: 13.9 g/dL (ref 13.0–17.0)
Immature Granulocytes: 0 %
Lymphocytes Relative: 18 %
Lymphs Abs: 0.9 10*3/uL (ref 0.7–4.0)
MCH: 29.8 pg (ref 26.0–34.0)
MCHC: 34.1 g/dL (ref 30.0–36.0)
MCV: 87.6 fL (ref 80.0–100.0)
Monocytes Absolute: 0.5 10*3/uL (ref 0.1–1.0)
Monocytes Relative: 9 %
Neutro Abs: 3.5 10*3/uL (ref 1.7–7.7)
Neutrophils Relative %: 70 %
Platelets: 180 10*3/uL (ref 150–400)
RBC: 4.66 MIL/uL (ref 4.22–5.81)
RDW: 12.9 % (ref 11.5–15.5)
WBC: 5.1 10*3/uL (ref 4.0–10.5)
nRBC: 0 % (ref 0.0–0.2)

## 2020-05-28 NOTE — Progress Notes (Signed)
HEMATOLOGY/ONCOLOGY CLINIC NOTE  Date of Service: 05/28/2020  Patient Care Team: Mechele Claude, MD as PCP - General (Family Medicine) Flo Shanks, MD as Consulting Physician (Otolaryngology) Lonie Peak, MD as Attending Physician (Radiation Oncology) Arthur Holms, MD (Inactive) as Consulting Physician (Hematology) Barrie Folk, RN (Inactive) as Oncology Nurse Navigator Malmfelt, Lise Auer, RN as Oncology Nurse Navigator (Oncology) Anabel Bene, RD as Dietitian (Nutrition)  REFERRING PHYSICIAN: Mechele Claude, MD  CHIEF COMPLAINTS/PURPOSE OF CONSULTATION:  Squamous Cell Carcinoma of the Head and Neck  HISTORY OF PRESENTING ILLNESS:   Dustin Duff Sr. is a wonderful 55 y.o. male who has been referred to [place] by Mechele Claude, MD for evaluation and management of Squamous Cell Carcinoma of the Head and Neck. Pt is accompanied today by [name], wife. The pt reports that he is doing well overall.   The pt reports he is fine. Pt has had both of his COVID19 vaccine. He is healing well from surgery. Some of the nerves were damaged and he has been working it. Part of his salivary gland on the right side was taken out and it burns when he starts to eat. Pt has seen swallowing evaluation today prior to coming. He is back to eating whatever he wants to now. When swallowing he has a burning sensation. Pt has a sleep apnea machine that dries out his throat. Pt also cannot open mouth completely since the dissection.  Pt had a biopsy on March 17th and found it was a squamous cell carcinoma. From his surgery he has various burning, tingling and numbness around his head, neck and shoulder.   Pt has sleep apnea. He also had gout but stopped drinking tea and has not had a flair up since the end of last year. He has no other major medical problems and works with Designer, fashion/clothing. He has had slight hearing loss over the years due to job and being in the [employer]. Pt has never smoked, but his  mother and brohter smoked a lot and pt was exposed to second hand smoke for about 23 years. Many of the pt's family members especially from his mother side have had cancer's that were very aggressive and they passed at early ages.   Of note prior to the patient's visit today, pt has had Surgical Pathology ([id number]) completed on 09/11/19 with results revealing "LYMPH NODE, MODIFIED RADICAL NECK I-IV, REGIONAL DISSECTION: Metastatic carcinoma in 3 of 49 lymph nodes (3/49), with extracapsular extension." Pt has had Surgical Pathology ([id number]) completed on 08/21/19 with results revealing "A. TONSIL, RIGHT: Squamous cell carcinoma, basaloid, p16 positive.  Margins not involved.  Carcinoma focally 1 mm from surgical margin.  See oncology table and comment.  B. TONSIL, LEFT:  Benign tonsil.  No malignancy identified. C. TONGUE, RIGHT BASE, BIOPSY:  Benign tonsillar tissue. No malignancy identified. D. TONGUE, LEFT BASE, BIOPSY: Benign tonsillar tissue.  No malignancy identified. "  Pt has had PET Skull Base to Thigh ([id number]) completed on 08/10/19 with results revealing "1. The 2.8 cm right level IIa lymph node has a maximum SUV of 28.4. A primary lesion is not readily seen. There is some asymmetric palatine tonsillar activity with the left tonsil having maximum SUV of 12.4 and the right 7.8, but no tonsillar mass is observed on the CT data. 2. No compelling findings of active malignancy in the chest, abdomen/pelvis, or skeleton. There is some accentuated anal activity which is likely physiologic given the lack of visible abnormality on the  CT data. 3. Other imaging findings of potential clinical significance: Diffuse hepatic steatosis. Hypodense left renal lesion is probably a cyst but technically too small to characterize." Pt has had [place] Core Biopsy ([id number]) completed on  07/26/19 with results revealing "Successful ultrasound-guided right cervical lymph node/lesion Biopsy." Pt has had of CT  Soft Tissue Neck ([id number]) completed on 07/13/19 with results revealing "4.1 x 3.2 cm necrotic/cystic node or nodal conglomerate at the right level II station likely reflecting nodal metastatic disease. No definite primary mass is identified within the oral cavity, pharynx or larynx. Correlate with direct visualization and consider direct tissue sampling. Additionally, PET-CT may be helpful. Cervical spondylosis as described. A prominent C6-C7 posterior disc osteophyte contributes to at least moderate bony spinal canal stenosis. Multilevel neural foraminal narrowing."  Most recent lab results (09/11/19) of CBC is as follows: all values are WNL except for Glucose at 100, Calcium at 8.7  On review of systems, pt reports various tingling, numbness, burnings sensations around head neck and shoulder, weight loss, insomnia and denies back pain, abdominal pain and any other symptoms.   On PMHx the pt reports tonsillectomy 08/21/19, radical neck dissection 09/11/19, sleep apnea, gout, wisdom tooth extraction, carpal tunnel surgery    On Social Hx the pt reports never smoked, smoked marijuana 30 years ago, works Forensic psychologist, pt was in [employer]   On Family Hx the pt reports brother metastatic lung cancer at age 2, [place] had liver cancer and pancreatic cancer in early 52's of age, mother has uterine cancer at age 55, grandmother on fathers side has stomach cancer, great grandmother had lung cancer    INTERVAL HISTORY: Dustin Griffie Sr. is a wonderful 55 y.o. male who is here for evaluation and management of Squamous Cell Carcinoma of the Head and Neck. The patient's last visit with [place] was on 03/20/2020. The pt reports that he is doing well overall.  The pt reports that he has been slowly eating better, but not at his target level. He has not used the feeding tube in 2 months. He was able to eat a cheeseburger last week with the help of washing it down with water and milk. He notes a  sensitivity to some spices such as pepper and basil.  The pt had an appt with Dr. Constance Holster and everything was all good. They will have a f/u every 3 months.  The pt reports that he has no new symptoms. He still has fatigue and pain yawning and some pain/reduced flexibility in his shoulder post-surgery. The movement in his jaw and neck was improved with the PT, but he has had his last appointment last Tuesday.  The pt is in the process of dealing with disability insurance and is job. He has been in contact with Edwyna Shell since last visit. He does not feel he could return to the same job.  The pt reports that he is still having a kidney stone problem as discussed at last visit. The abdominal pain has eased since then. This will be monitored.  The pt has received both his COVID vaccines and booster and annual flu vaccine.   Lab results today 05/28/2020 of CBC w/diff and CMP is as follows: all values are WNL. 05/28/2020 Magnesium at 1.9.  On review of systems, pt reports fatigue, minor chills and denies n/v/d, leg swelling, back pain, belly pain and any other symptoms.  MEDICAL HISTORY:  Past Medical History:  Diagnosis Date  . Arthritis   . Change in stool  02/22/2017  . Family history of cancer   . Family history of uterine cancer   . Gout 02/22/2017  . Hyperlipidemia   . Sleep apnea    Uses CPAP  . Tonsil cancer [facility]) DX'd 07/2019   metastatic     SURGICAL HISTORY: Past Surgical History:  Procedure Laterality Date  . CARPAL TUNNEL RELEASE Right 04/10/2019  . DIRECT LARYNGOSCOPY N/A 08/21/2019   Procedure: DIRECT LARYNGOSCOPY - WITH BX;  Surgeon: [name], MD;  Location: [place];  Service: ENT;  Laterality: N/A;  . IR GASTROSTOMY TUBE MOD SED  10/13/2019  . IR IMAGING GUIDED PORT INSERTION  10/13/2019  . NECK DISSECTION  09/11/2019  . RADICAL NECK DISSECTION Right 09/11/2019   Procedure: RADICAL NECK DISSECTION;  Surgeon: [name], MD;  Location: [place];   Service: ENT;  Laterality: Right;  . TONSILLECTOMY Bilateral 08/21/2019   Procedure: TONSILLECTOMY;  Surgeon: [name], MD;  Location: [place];  Service: ENT;  Laterality: Bilateral;  . WISDOM TOOTH EXTRACTION       SOCIAL HISTORY: Social History   Socioeconomic History  . Marital status: Married    Spouse name: Not on file  . Number of children: 1  . Years of education: Not on file  . Highest education level: Not on file  Occupational History  . Occupation: Clinical biochemist  Tobacco Use  . Smoking status: Never Smoker  . Smokeless tobacco: Never Used  Vaping Use  . Vaping Use: Never used  Substance and Sexual Activity  . Alcohol use: No  . Drug use: No  . Sexual activity: Not on file  Other Topics Concern  . Not on file  Social History Narrative   Patient is married and has 1 son.   Patient has never smoked, used tobacco products.   Patient does not drink.  Patient does not use illicit drugs.   Social Determinants of Health   Financial Resource Strain: Not on file  Food Insecurity: Not on file  Transportation Needs: Not on file  Physical Activity: Not on file  Stress: Not on file  Social Connections: Not on file  Intimate Partner Violence: Not on file     FAMILY HISTORY: Family History  Problem Relation Age of Onset  . COPD Mother   . Uterine cancer Mother 54  . Lung cancer Brother 43       smoker  . Lung cancer Maternal Grandfather   . Stomach cancer Paternal Grandmother        d. 80s  . Cervical cancer Maternal Aunt   . Lung cancer Maternal Grandmother        smoker  . Prostate cancer Paternal Grandfather   . Pancreatic cancer Cousin 90       mother's maternal first cousin  . Stomach cancer Cousin 58       mother's maternal first cousin  . Cancer Cousin        mother's maternal first cousin with NOS cancer  . Cancer Other        MGMs sister with cancer NOS  . Lung cancer Other        smoker  . Cancer Other        mother's  maternal grandfather with NOS cancer     ALLERGIES:   has No Known Allergies.   MEDICATIONS:  Current Outpatient Medications  Medication Sig Dispense Refill  . dicyclomine (BENTYL) 20 MG tablet Take 1 tablet (20 mg total) by mouth 4 (four) times  daily as needed for spasms (abdominal or intestinal spasms). (Patient not taking: Reported on 02/26/2020) 40 tablet 0  . lidocaine-prilocaine (EMLA) cream  (Patient not taking: Reported on 03/27/2020)    . loratadine (EQ LORATADINE) 10 MG tablet Take 10 mg by mouth daily.  (Patient not taking: Reported on 03/21/2020)    . LORazepam (ATIVAN) 0.5 MG tablet Take 1-2 tablets (0.5-1 mg total) by mouth every 8 (eight) hours as needed for anxiety or sleep (chemotherapy related nausea). (Patient not taking: Reported on 03/27/2020) 60 tablet 0  . Nutritional Supplements (FEEDING SUPPLEMENT, OSMOLITE 1.5 CAL,) LIQD Begin Osmolite 1.5 via PEG.  Advance as tolerated to goal rate of 7 cartons daily in 4 feedings with 120 mL of free water before and after each bolus feeding.  In addition mix 30 mL Prosource with 60 mL of water 4 times daily and infuse into PEG with 60 mL free water after each Prosource administration.  Drink an additional 10 ounces of water daily or put into PEG.  If it provides 2885 cal, 144.3 g protein and 2887 mL free water/100% estimated needs. (Patient not taking: Reported on 03/21/2020) 1659 mL 3  . pantoprazole (PROTONIX) 40 MG tablet Take 1 tablet (40 mg total) by mouth daily before breakfast. (Patient not taking: Reported on 03/21/2020) 30 tablet 2  . sodium fluoride (FLUORISHIELD) 1.1 % GEL dental gel APPLY TO TOOTHBRUSH OR IN FLUORIDE TRAYS AS DIRECTED. REPEAT NIGHTLY. SPIT OUT EXCESS DO NOT SWALLOW. DO NOT RINSE AFTERWARDS (Patient not taking: Reported on 03/27/2020)    . sodium fluoride (PREVIDENT 5000 PLUS) 1.1 % CREA dental cream Apply to tooth brush or in fluoride trays as directed.  Repeat nightly. Spit out excess-DO NOT swallow. DO NOT  rinse afterwards. (Patient not taking: Reported on 03/27/2020) 2 Tube prn   No current facility-administered medications for this visit.   Facility-Administered Medications Ordered in Other Visits  Medication Dose Route Frequency Provider Last Rate Last Admin  . 0.9 %  sodium chloride infusion   Intravenous Continuous Monia Sabal, PA-C         REVIEW OF SYSTEMS:   10 Point review of Systems was done is negative except as noted above. Marland Kitchen   PHYSICAL EXAMINATION: ECOG PERFORMANCE STATUS: 1 - Symptomatic but completely ambulatory  Vitals:   05/28/20 1231  BP: 131/87  Pulse: 71  Resp: 20  Temp: (!) 97 F (36.1 C)  SpO2: 99%   Filed Weights   05/28/20 1231  Weight: 250 lb 12.8 oz (113.8 kg)   Body mass index is 38.13 kg/m.   GENERAL:alert, in no acute distress and comfortable SKIN: no acute rashes, no significant lesions EYES: conjunctiva are pink and non-injected, sclera anicteric OROPHARYNX: MMM, no exudates, no oropharyngeal erythema or ulceration NECK: supple, no JVD LYMPH:  no palpable lymphadenopathy in the cervical, axillary or inguinal regions LUNGS: clear to auscultation b/l with normal respiratory effort HEART: regular rate & rhythm ABDOMEN:  normoactive bowel sounds , non tender, not distended. Extremity: no pedal edema PSYCH: alert & oriented x 3 with fluent speech NEURO: no focal motor/sensory deficits   LABORATORY DATA:  I have reviewed the data as listed  CBC Latest Ref Rng & Units 03/20/2020 02/08/2020 01/31/2020  WBC 4.0 - 10.5 K/uL 3.7(L) 3.7(L) 7.7  Hemoglobin 13.0 - 17.0 g/dL 12.9(L) 12.6(L) 12.1(L)  Hematocrit 39.0 - 52.0 % 37.9(L) 38.0(L) 37.0(L)  Platelets 150 - 400 K/uL 179 188 175    CMP Latest Ref Rng & Units 03/20/2020 02/08/2020  01/31/2020  Glucose 70 - 99 mg/dL 104(H) 100(H) 139(H)  BUN 6 - 20 mg/dL 20 16 19   Creatinine 0.61 - 1.24 mg/dL 1.05 1.11 1.19  Sodium 135 - 145 mmol/L 139 138 135  Potassium 3.5 - 5.1 mmol/L 3.9 4.0 3.7   Chloride 98 - 111 mmol/L 103 102 99  CO2 22 - 32 mmol/L 29 28 25   Calcium 8.9 - 10.3 mg/dL 9.3 9.2 8.8(L)  Total Protein 6.5 - 8.1 g/dL - - 7.1  Total Bilirubin 0.3 - 1.2 mg/dL - - 0.9  Alkaline Phos 38 - 126 U/L - - 58  AST 15 - 41 U/L - - 25  ALT 0 - 44 U/L - - 29     RADIOGRAPHIC STUDIES: I have personally reviewed the radiological images as listed and agreed with the findings in the report.  PET/CT 08/10/2019: IMPRESSION: 1. The 2.8 cm right level IIa lymph node has a maximum SUV of 28.4. A primary lesion is not readily seen. There is some asymmetric palatine tonsillar activity with the left tonsil having maximum SUV of 12.4 and the right 7.8, but no tonsillar mass is observed on the CT data. 2. No compelling findings of active malignancy in the chest, abdomen/pelvis, or skeleton. There is some accentuated anal activity which is likely physiologic given the lack of visible abnormality on the CT data. 3. Other imaging findings of potential clinical significance: Diffuse hepatic steatosis. Hypodense left renal lesion is probably a cyst but technically too small to characterize.   Electronically Signed   By: Van Clines M.D.   On: 08/10/2019 15:54   08/21/19 of Surgical Pathology [id number]    SURGICAL PATHOLOGY  * THIS IS AN AMENDED REPORT   CASE: [id number]  PATIENT: [facility]  Surgical Pathology Report   Amendment    Reason for Amendment #1: Other   Clinical History: squamous cell of neck and head (cm)     FINAL MICROSCOPIC DIAGNOSIS:   A. TONSIL, RIGHT:  - Squamous cell carcinoma, basaloid, p16 positive.  - Margins not involved.  - Carcinoma focally 1 mm from surgical margin.  - See oncology table and comment.   B. TONSIL, LEFT:  - Benign tonsil.  - No malignancy identified.   C. TONGUE, RIGHT BASE, BIOPSY:  - Benign tonsillar tissue.  - No malignancy identified.   D. TONGUE, LEFT BASE, BIOPSY:  - Benign tonsillar tissue.   - No malignancy identified.   ONCOLOGY TABLE:  PHARYNX (OROPHARYNX, HYPOPHARYNX, NASOPHARYNX):  Procedure: Right and left tonsillectomy and right and left tongue base  biopsies.  Tumor Site: Right tonsil.  Tumor Laterality: Right tonsil.  Tumor Focality: Unifocal.  Tumor Size: 1 cm x 0.3 cm (3 mm) depth.  Histologic Type: Squamous cell carcinoma, basaloid type, p16 positive,  EBV negative.  Histologic Grade: Poorly differentiated.  Margins: Free of tumor. Carcinoma focally 1 mm from the nearest  surgical margin.    Distance to closest Margin: 1 mm.  Lymphovascular Invasion: Present.  Perineural Invasion: Not identified.  Regional Lymph Nodes: No lymph nodes submitted with this case. The  previous right neck lymph node needle core biopsy (MCS 20 [phone]) shows  squamous cell carcinoma.  Pathologic Stage Classification (pTNM, AJCC 8th Edition): pT1, pN1, see  comment.    09/11/19 of Surgical Pathology Report [id number])  COMMENT:   Metastatic carcinoma is present in 3 lymph nodes - 1 level 1 lymph node,  1 level 2 lymph node (this metastatic focus also involves skeletal  muscle),  and 1 level 3 lymph node (highlighted by a cytokeratin  immunohistochemical stain). Additional studies can be performed (block  A11) upon clinician request.    ASSESSMENT & PLAN:   [name] is a 55 y.o. male with:  1. Rt Tonsilar Basaloid Squamous cell carcinoma , HPV+ve. PT1,pN1,M0. 3/49 LN+ve High risk features - LVI, extranodal extension, high risk morphology. Significant second hand smoke exposure S/p b/l tonsillectomy on 08/21/2019 S/p rt radical neck dissection   PLAN: -Discussed pt labwork today, 011/18/2022; all counts and chemistries normal, Magnesium normal -Recommend pt not drink milk with meals due to blocking iron absorption. -Discussed possibility of removing the feeding tube and will confirm with radiologist at [facility]. -Recommend pt start doing  stationary bike for functional mobility and staying active. -Discussed removal of port and tube. The pt wishes to have both removed ASAP. -Recommend pt receive his Pneumonia vaccines. He will get this at his local pharmacy. -Recommend pt see dentist for jaw pain. -Will see back in 6 months with labs.   FOLLOW UP: IR for port a cath and G tube removal in 1-2 weeks RTC with Dr Irene Limbo with labs in 6 months    The total time spent in the appointment was 30 minutes and more than 50% was on counseling and direct patient cares.   Sullivan Lone MD MS AAHIVMS [facility] Hematology/Oncology Physician [facility]  (Office):       [phone] (Work cell):  [phone] (Fax):           [phone]  05/28/2020 8:00 AM  I have reviewed the above documentation for accuracy and completeness, and I agree with the above.

## 2020-05-30 ENCOUNTER — Encounter (HOSPITAL_COMMUNITY): Payer: 59 | Admitting: Physical Therapy

## 2020-06-07 ENCOUNTER — Other Ambulatory Visit: Payer: Self-pay | Admitting: Student

## 2020-06-10 ENCOUNTER — Ambulatory Visit (HOSPITAL_COMMUNITY)
Admission: RE | Admit: 2020-06-10 | Discharge: 2020-06-10 | Disposition: A | Discharge status: Home / Self Care | Payer: 59 | Source: Ambulatory Visit | Attending: Hematology | Admitting: Hematology

## 2020-06-10 ENCOUNTER — Other Ambulatory Visit: Payer: Self-pay

## 2020-06-10 ENCOUNTER — Encounter (HOSPITAL_COMMUNITY): Payer: Self-pay

## 2020-06-10 DIAGNOSIS — C099 Malignant neoplasm of tonsil, unspecified: Secondary | ICD-10-CM

## 2020-06-10 DIAGNOSIS — Z452 Encounter for adjustment and management of vascular access device: Secondary | ICD-10-CM | POA: Diagnosis not present

## 2020-06-10 HISTORY — PX: IR GASTROSTOMY TUBE REMOVAL: IMG5492

## 2020-06-10 HISTORY — PX: IR REMOVAL TUN ACCESS W/ PORT W/O FL MOD SED: IMG2290

## 2020-06-10 MED ORDER — LIDOCAINE-EPINEPHRINE (PF) 2 %-1:200000 IJ SOLN
INTRAMUSCULAR | Status: AC
  Filled 2020-06-10: qty 20

## 2020-06-10 MED ORDER — CEFAZOLIN SODIUM-DEXTROSE 2-4 GM/100ML-% IV SOLN
INTRAVENOUS | Status: AC
  Filled 2020-06-10: qty 100

## 2020-06-10 MED ORDER — CEFAZOLIN SODIUM-DEXTROSE 2-4 GM/100ML-% IV SOLN
2.0000 g | INTRAVENOUS | Status: AC
  Administered 2020-06-10: 2 g via INTRAVENOUS

## 2020-06-10 MED ORDER — MIDAZOLAM HCL 2 MG/2ML IJ SOLN
INTRAMUSCULAR | Status: AC | PRN
  Administered 2020-06-10 (×4): 1 mg via INTRAVENOUS

## 2020-06-10 MED ORDER — FENTANYL CITRATE (PF) 100 MCG/2ML IJ SOLN
INTRAMUSCULAR | Status: AC | PRN
  Administered 2020-06-10 (×2): 50 ug via INTRAVENOUS

## 2020-06-10 MED ORDER — FENTANYL CITRATE (PF) 100 MCG/2ML IJ SOLN
INTRAMUSCULAR | Status: AC
  Filled 2020-06-10: qty 2

## 2020-06-10 MED ORDER — LIDOCAINE HCL (PF) 1 % IJ SOLN
INTRAMUSCULAR | Status: AC | PRN
  Administered 2020-06-10: 10 mL

## 2020-06-10 MED ORDER — SODIUM CHLORIDE 0.9 % IV SOLN: INTRAVENOUS | Status: DC

## 2020-06-10 MED ORDER — MIDAZOLAM HCL 2 MG/2ML IJ SOLN
INTRAMUSCULAR | Status: AC
  Filled 2020-06-10: qty 4

## 2020-06-10 MED ORDER — LIDOCAINE VISCOUS HCL 2 % MT SOLN
OROMUCOSAL | Status: AC
  Administered 2020-06-10: 5 mL
  Filled 2020-06-10: qty 15

## 2020-06-10 NOTE — H&P (Signed)
Referring Physician(s): Brunetta Genera  Supervising Physician: Mir, Sharen Heck  Patient Status:  [facility]  Chief Complaint:  "I'm getting my port and stomach tube out"  Subjective: Patient familiar to IR service from right cervical lymph node biopsy on 07/26/2019 and Port-A-Cath/gastrostomy tube placements on 10/13/2019.  He has a history of tonsillar cancer and has completed treatment.  He presents today for both Port-A-Cath and gastrostomy tube removals.  He currently denies fever, headache, chest pain, dyspnea, cough, back pain, nausea, vomiting or bleeding.  He does have some soreness at the G-tube site as well as along the right neck region.  Past Medical History:  Diagnosis Date  . Arthritis   . Change in stool 02/22/2017  . Family history of cancer   . Family history of uterine cancer   . Gout 02/22/2017  . Hyperlipidemia   . Sleep apnea    Uses CPAP  . Tonsil cancer [facility]) DX'd 07/2019   metastatic   Past Surgical History:  Procedure Laterality Date  . CARPAL TUNNEL RELEASE Right 04/10/2019  . DIRECT LARYNGOSCOPY N/A 08/21/2019   Procedure: DIRECT LARYNGOSCOPY - WITH BX;  Surgeon: [name], MD;  Location: [place];  Service: ENT;  Laterality: N/A;  . IR GASTROSTOMY TUBE MOD SED  10/13/2019  . IR IMAGING GUIDED PORT INSERTION  10/13/2019  . NECK DISSECTION  09/11/2019  . RADICAL NECK DISSECTION Right 09/11/2019   Procedure: RADICAL NECK DISSECTION;  Surgeon: [name], MD;  Location: [place];  Service: ENT;  Laterality: Right;  . TONSILLECTOMY Bilateral 08/21/2019   Procedure: TONSILLECTOMY;  Surgeon: [name], MD;  Location: [place];  Service: ENT;  Laterality: Bilateral;  . WISDOM TOOTH EXTRACTION        Allergies: Patient has no known allergies.  Medications: Prior to Admission medications   Medication Sig Start Date End Date Taking? Authorizing Provider  dicyclomine (BENTYL) 20 MG tablet Take 1 tablet (20 mg total) by  mouth 4 (four) times daily as needed for spasms (abdominal or intestinal spasms). Patient not taking: No sig reported 02/01/20   Rolland Porter, MD  lidocaine-prilocaine (EMLA) cream  10/18/19   [provider]  loratadine (EQ LORATADINE) 10 MG tablet Take 10 mg by mouth daily.  Patient not taking: Reported on 03/21/2020    [provider]  LORazepam (ATIVAN) 0.5 MG tablet Take 1-2 tablets (0.5-1 mg total) by mouth every 8 (eight) hours as needed for anxiety or sleep (chemotherapy related nausea). Patient not taking: Reported on 03/27/2020 12/05/19   Brunetta Genera, MD  Nutritional Supplements (FEEDING SUPPLEMENT, OSMOLITE 1.5 CAL,) LIQD Begin Osmolite 1.5 via PEG.  Advance as tolerated to goal rate of 7 cartons daily in 4 feedings with 120 mL of free water before and after each bolus feeding.  In addition mix 30 mL Prosource with 60 mL of water 4 times daily and infuse into PEG with 60 mL free water after each Prosource administration.  Drink an additional 10 ounces of water daily or put into PEG.  If it provides 2885 cal, 144.3 g protein and 2887 mL free water/100% estimated needs. Patient not taking: Reported on 03/21/2020 11/10/19   Eppie Gibson, MD  pantoprazole (PROTONIX) 40 MG tablet Take 1 tablet (40 mg total) by mouth daily before breakfast. Patient not taking: Reported on 03/21/2020 10/25/19   Brunetta Genera, MD  sodium fluoride (FLUORISHIELD) 1.1 % GEL dental gel APPLY TO TOOTHBRUSH OR IN FLUORIDE TRAYS AS DIRECTED. REPEAT  NIGHTLY. SPIT OUT EXCESS DO NOT SWALLOW. DO NOT RINSE AFTERWARDS Patient not taking: Reported on 03/27/2020 10/17/19   [provider]  sodium fluoride (PREVIDENT 5000 PLUS) 1.1 % CREA dental cream Apply to tooth brush or in fluoride trays as directed.  Repeat nightly. Spit out excess-DO NOT swallow. DO NOT rinse afterwards. Patient not taking: Reported on 03/27/2020 08/18/19   Lenn Cal, DDS     Vital Signs: Vitals:   06/10/20 1303   BP: (!) 143/93  Pulse: 78  Resp: 20  Temp: 98.1 F (36.7 C)  SpO2: 100%      Physical Exam awake, alert.  Chest clear to auscultation bilaterally.  Clean, intact right chest wall Port-A-Cath.  Heart with regular rate and rhythm.  Abdomen soft, positive bowel sounds, intact gastrostomy tube with mild erythema at insertion site, site mildly tender to palpation.  Extremities with full range of motion.  Imaging: No results found.  Labs:  CBC: Recent Labs    01/31/20 2325 02/08/20 1005 03/20/20 0850 05/28/20 1212  WBC 7.7 3.7* 3.7* 5.1  HGB 12.1* 12.6* 12.9* 13.9  HCT 37.0* 38.0* 37.9* 40.8  PLT 175 188 179 180    COAGS: Recent Labs    10/13/19 0801  INR 1.0    BMP: Recent Labs    12/05/19 1330 12/28/19 1334 01/31/20 2325 02/08/20 1005 03/20/20 0850 05/28/20 1212  NA 138 139 135 138 139 139  K 4.1 4.1 3.7 4.0 3.9 4.0  CL 101 102 99 102 103 103  CO2 31 25 25 28 29 28   GLUCOSE 134* 72 139* 100* 104* 94  BUN 17 18 19 16 20 16   CALCIUM 9.9 9.7 8.8* 9.2 9.3 9.3  CREATININE 1.06 1.09 1.19 1.11 1.05 1.07  GFRNONAA >60 >60 >60 >60 >60 >60  GFRAA >60 >60 >60 >60  --   --     LIVER FUNCTION TESTS: Recent Labs    11/03/19 1120 12/05/19 1330 01/31/20 2325 05/28/20 1212  BILITOT 0.4 0.6 0.9 0.5  AST 14* 14* 25 16  ALT 20 17 29 15   ALKPHOS 69 89 58 66  PROT 6.7 6.9 7.1 7.1  ALBUMIN 3.5 3.6 3.9 3.7    Assessment and Plan: Patient familiar to IR service from right cervical lymph node biopsy on 07/26/2019 and Port-A-Cath/gastrostomy tube placements on 10/13/2019.  He has a history of tonsillar cancer and has completed treatment.  He presents today for both Port-A-Cath and gastrostomy tube removals. Details/risks of procedures, including but not limited to, internal bleeding, infection, injury to adjacent structures discussed with patient with his understanding and consent.   Electronically Signed: D. Rowe Robert, PA-C 06/10/2020, 12:57 PM   I spent a total  of  20 minutes at the the patient's bedside AND on the patient's hospital floor or unit, greater than 50% of which was counseling/coordinating care for Port-A-Cath and gastrostomy tube removals

## 2020-06-10 NOTE — Discharge Instructions (Signed)
Interventional radiology phone numbers [phone] After hours [phone]    PEG Tube Removal, Care After This sheet gives you information about how to care for yourself after your procedure. Your health care provider may also give you more specific instructions. If you have problems or questions, contact your health care provider. What can I expect after the procedure? After the procedure, it is common to have:  Mild discomfort at the opening in your skin where the tube was removed (tube removal site).  A small amount of drainage from the opening. Follow these instructions at home: Care of the tube removal site  Follow instructions from your health care provider about how to take care of your tube removal site. Make sure you: ? Wash your hands with soap and water for at least 20 seconds before and after you change your bandage (dressing). If soap and water are not available, use hand sanitizer. ? Change your dressing as told by your health care provider.  Check your tube removal site every day for signs of infection. Check for: ? Redness, swelling, or pain. ? Fluid or blood. ? Warmth. ? Pus or a bad smell.  Do not take baths, swim, or use a hot tub until your health care provider approves.  You may shower tomorrow. Activity  Return to your normal activities as told by your health care provider.  Do not lift anything that is heavier than 10 lb (4.5 kg), or the limit that you are told, until your health care provider says that it is safe.  Do not eat a large meal all at once. Eat smaller feedings until your site has healed up.   General instructions  Take over-the-counter and prescription medicines only as told by your health care provider.  Follow instructions from your health care provider about eating and drinking.  Keep all follow-up visits. This is important. Contact a health care provider if:  You have a fever.  You have pain in your abdomen.  You have  redness, swelling, or pain around your tube removal site.  You have fluid or blood coming from your tube removal site.  Your tube removal site feels warm to the touch.  You have pus or a bad smell coming from your tube removal site.  You have nausea or vomiting. Get help right away if:  You have persistent bleeding from your tube removal site.  You have severe pain in your abdomen.  You are not able to eat or drink anything by mouth. Summary  Follow instructions from your health care provider about how to take care of your tube removal site.  Do not take baths, swim, or use a hot tub until your health care provider approves. Ask your health care provider if you may take showers. You may shower tomorrow.  Check your tube removal site every day for signs of infection.  Return to your normal activities as told by your health care provider. This information is not intended to replace advice given to you by your health care provider. Make sure you discuss any questions you have with your health care provider. Document Revised: 09/08/2019 Document Reviewed: 09/08/2019 Elsevier Patient Education  2021 [place] Removal, Care After This sheet gives you information about how to care for yourself after your procedure. Your health care provider may also give you more specific instructions. If you have problems or questions, contact your health care provider. What can I expect after the procedure? After the procedure,  it is common to have:  Soreness or pain near your incision.  Some swelling or bruising near your incision. Follow these instructions at home: Medicines  Take over-the-counter and prescription medicines only as told by your health care provider.  If you were prescribed an antibiotic medicine, take it as told by your health care provider. Do not stop taking the antibiotic even if you start to feel better. Bathing  Do not take baths, swim, or use a  hot tub until your health care provider approves.You may shower tomorrow. Incision care  Follow instructions from your health care provider about how to take care of your incision. Make sure you: ? Wash your hands with soap and water before you change your bandage (dressing). If soap and water are not available, use hand sanitizer. ? Change your dressing as told by your health care provider. ? Keep your dressing dry. ? Leave skin glue in place. These skin closures may need to stay in place for 2 weeks or longer.   Check your incision area every day for signs of infection. Check for: ? More redness, swelling, or pain. ? More fluid or blood. ? Warmth. ? Pus or a bad smell.   Driving  Do not drive for 24 hours if you were given a medicine to help you relax (sedative) during your procedure.  If you did not receive a sedative, ask your health care provider when it is safe to drive.   Activity  Return to your normal activities as told by your health care provider. Ask your health care provider what activities are safe for you.  Do not lift anything that is heavier than 10 lb (4.5 kg), or the limit that you are told, until your health care provider says that it is safe.  Do not do activities that involve lifting your arms over your head. General instructions  Do not use any products that contain nicotine or tobacco, such as cigarettes and e-cigarettes. These can delay healing. If you need help quitting, ask your health care provider.  Keep all follow-up visits as told by your health care provider. This is important. Contact a health care provider if:  You have more redness, swelling, or pain around your incision.  You have more fluid or blood coming from your incision.  Your incision feels warm to the touch.  You have pus or a bad smell coming from your incision.  You have pain that is not relieved by your pain medicine. Get help right away if you have:  A fever or  chills.  Chest pain.  Difficulty breathing. Summary  After the procedure, it is common to have pain, soreness, swelling, or bruising near your incision.  Do not drive for 24 hours if you were given a sedative during your procedure.  Return to your normal activities as told by your health care provider. Ask your health care provider what activities are safe for you. This information is not intended to replace advice given to you by your health care provider. Make sure you discuss any questions you have with your health care provider. Document Revised: 06/10/2017 Document Reviewed: 06/10/2017 Elsevier Patient Education  2021 [place].      Moderate Conscious Sedation, Adult, Care After This sheet gives you information about how to care for yourself after your procedure. Your health care provider may also give you more specific instructions. If you have problems or questions, contact your health care provider. What can I expect after the procedure? After  the procedure, it is common to have:  Sleepiness for several hours.  Impaired judgment for several hours.  Difficulty with balance.  Vomiting if you eat too soon. Follow these instructions at home: For the time period you were told by your health care provider:  Rest.  Do not participate in activities where you could fall or become injured.  Do not drive or use machinery.  Do not drink alcohol.  Do not take sleeping pills or medicines that cause drowsiness.  Do not make important decisions or sign legal documents.  Do not take care of children on your own.      Eating and drinking  Follow the diet recommended by your health care provider.  Drink enough fluid to keep your urine pale yellow.  If you vomit: ? Drink water, juice, or soup when you can drink without vomiting. ? Make sure you have little or no nausea before eating solid foods.   General instructions  Take over-the-counter and prescription  medicines only as told by your health care provider.  Have a responsible adult stay with you for the time you are told. It is important to have someone help care for you until you are awake and alert.  Do not smoke.  Keep all follow-up visits as told by your health care provider. This is important. Contact a health care provider if:  You are still sleepy or having trouble with balance after 24 hours.  You feel light-headed.  You keep feeling nauseous or you keep vomiting.  You develop a rash.  You have a fever.  You have redness or swelling around the IV site. Get help right away if:  You have trouble breathing.  You have new-onset confusion at home. Summary  After the procedure, it is common to feel sleepy, have impaired judgment, or feel nauseous if you eat too soon.  Rest after you get home. Know the things you should not do after the procedure.  Follow the diet recommended by your health care provider and drink enough fluid to keep your urine pale yellow.  Get help right away if you have trouble breathing or new-onset confusion at home. This information is not intended to replace advice given to you by your health care provider. Make sure you discuss any questions you have with your health care provider. Document Revised: 08/25/2019 Document Reviewed: 03/23/2019 Elsevier Patient Education  2021 [employer].

## 2020-06-10 NOTE — Procedures (Signed)
Interventional Radiology Procedure Note  Procedure: Completion of chemotherapy   Indication: 1. Port removal 2. G tube removal  Findings: Please refer to procedural dictation for full description.  Complications: None  EBL: < 10 mL  Miachel Roux, MD [phone]

## 2020-06-19 NOTE — Progress Notes (Incomplete)
HEMATOLOGY/ONCOLOGY CLINIC NOTE  Date of Service: 06/19/2020  Patient Care Team: Claretta Fraise, MD as PCP - General (Family Medicine) Jodi Marble, MD as Consulting Physician (Otolaryngology) Eppie Gibson, MD as Attending Physician (Radiation Oncology) Tish Men, MD (Inactive) as Consulting Physician (Hematology) Leota Sauers, RN (Inactive) as Oncology Nurse Navigator Mal[place]elt, Stephani Police, RN as Oncology Nurse Navigator (Oncology) Karie Mainland, RD as Dietitian (Nutrition)  REFERRING PHYSICIAN: Claretta Fraise, MD  CHIEF COMPLAINTS/PURPOSE OF CONSULTATION:  Squamous Cell Carcinoma of the Head and Neck  HISTORY OF PRESENTING ILLNESS:   Dustin Leiter Sr. is a wonderful 55 y.o. male who has been referred to [place] by Claretta Fraise, MD for evaluation and management of Squamous Cell Carcinoma of the Head and Neck. Pt is accompanied today by Dustin Rios, wife. The pt reports that he is doing well overall.   The pt reports he is fine. Pt has had both of his COVID19 vaccine. He is healing well from surgery. Some of the nerves were damaged and he has been working it. Part of his salivary gland on the right side was taken out and it burns when he starts to eat. Pt has seen swallowing evaluation today prior to coming. He is back to eating whatever he wants to now. When swallowing he has a burning sensation. Pt has a sleep apnea machine that dries out his throat. Pt also cannot open mouth completely since the dissection.  Pt had a biopsy on March 17th and found it was a squamous cell carcinoma. From his surgery he has various burning, tingling and numbness around his head, neck and shoulder.   Pt has sleep apnea. He also had gout but stopped drinking tea and has not had a flair up since the end of last year. He has no other major medical problems and works with Forensic psychologist. He has had slight hearing loss over the years due to job and being in the [employer]. Pt has never smoked, but his  mother and brohter smoked a lot and pt was exposed to second hand smoke for about 23 years. Many of the pt's family members especially from his mother side have had cancer's that were very aggressive and they passed at early ages.   Of note prior to the patient's visit today, pt has had Surgical Pathology ([id number]) completed on 09/11/19 with results revealing "LYMPH NODE, MODIFIED RADICAL NECK I-IV, REGIONAL DISSECTION: Metastatic carcinoma in 3 of 49 lymph nodes (3/49), with extracapsular extension." Pt has had Surgical Pathology ([id number]) completed on 08/21/19 with results revealing "A. TONSIL, RIGHT: Squamous cell carcinoma, basaloid, p16 positive.  Margins not involved.  Carcinoma focally 1 mm from surgical margin.  See oncology table and comment.  B. TONSIL, LEFT:  Benign tonsil.  No malignancy identified. C. TONGUE, RIGHT BASE, BIOPSY:  Benign tonsillar tissue. No malignancy identified. D. TONGUE, LEFT BASE, BIOPSY: Benign tonsillar tissue.  No malignancy identified. "  Pt has had PET Skull Base to Thigh ([id number]) completed on 08/10/19 with results revealing "1. The 2.8 cm right level IIa lymph node has a maximum SUV of 28.4. A primary lesion is not readily seen. There is some asymmetric palatine tonsillar activity with the left tonsil having maximum SUV of 12.4 and the right 7.8, but no tonsillar mass is observed on the CT data. 2. No compelling findings of active malignancy in the chest, abdomen/pelvis, or skeleton. There is some accentuated anal activity which is likely physiologic given the lack of visible abnormality on the  CT data. 3. Other imaging findings of potential clinical significance: Diffuse hepatic steatosis. Hypodense left renal lesion is probably a cyst but technically too small to characterize." Pt has had [place] Core Biopsy ([id number]) completed on  07/26/19 with results revealing "Successful ultrasound-guided right cervical lymph node/lesion Biopsy." Pt has had of CT  Soft Tissue Neck ([id number]) completed on 07/13/19 with results revealing "4.1 x 3.2 cm necrotic/cystic node or nodal conglomerate at the right level II station likely reflecting nodal metastatic disease. No definite primary mass is identified within the oral cavity, pharynx or larynx. Correlate with direct visualization and consider direct tissue sampling. Additionally, PET-CT may be helpful. Cervical spondylosis as described. A prominent C6-C7 posterior disc osteophyte contributes to at least moderate bony spinal canal stenosis. Multilevel neural foraminal narrowing."  Most recent lab results (09/11/19) of CBC is as follows: all values are WNL except for Glucose at 100, Calcium at 8.7  On review of systems, pt reports various tingling, numbness, burnings sensations around head neck and shoulder, weight loss, insomnia and denies back pain, abdominal pain and any other symptoms.   On PMHx the pt reports tonsillectomy 08/21/19, radical neck dissection 09/11/19, sleep apnea, gout, wisdom tooth extraction, carpal tunnel surgery    On Social Hx the pt reports never smoked, smoked marijuana 30 years ago, works Forensic psychologist, pt was in [employer]   On Family Hx the pt reports brother metastatic lung cancer at age 69, [place] had liver cancer and pancreatic cancer in early 81's of age, mother has uterine cancer at age 8, grandmother on fathers side has stomach cancer, great grandmother had lung cancer    INTERVAL HISTORY: Dustin Pavey Sr. is a wonderful 55 y.o. male who is here for evaluation and management of Squamous Cell Carcinoma of the Head and Neck. The patient's last visit with [place] was on 05/28/2020. The pt reports that he is doing well overall.  The pt reports ***  Lab results today 06/20/2020 of CBC w/diff and CMP is as follows: all values are WNL except for *** 06/20/2020 TSH *** 06/20/2020 T4 *** 06/20/2020 Magnesium ***  On review of systems, pt reports *** and denies *** and  any other symptoms.  MEDICAL HISTORY:  Past Medical History:  Diagnosis Date  . Arthritis   . Change in stool 02/22/2017  . Family history of cancer   . Family history of uterine cancer   . Gout 02/22/2017  . Hyperlipidemia   . Sleep apnea    Uses CPAP  . Tonsil cancer [facility]) DX'd 07/2019   metastatic     SURGICAL HISTORY: Past Surgical History:  Procedure Laterality Date  . CARPAL TUNNEL RELEASE Right 04/10/2019  . DIRECT LARYNGOSCOPY N/A 08/21/2019   Procedure: DIRECT LARYNGOSCOPY - WITH BX;  Surgeon: [name], MD;  Location: [place];  Service: ENT;  Laterality: N/A;  . IR GASTROSTOMY TUBE MOD SED  10/13/2019  . IR GASTROSTOMY TUBE REMOVAL  06/10/2020  . IR IMAGING GUIDED PORT INSERTION  10/13/2019  . IR REMOVAL TUN ACCESS W/ PORT W/O FL MOD SED  06/10/2020  . NECK DISSECTION  09/11/2019  . RADICAL NECK DISSECTION Right 09/11/2019   Procedure: RADICAL NECK DISSECTION;  Surgeon: [name], MD;  Location: [place];  Service: ENT;  Laterality: Right;  . TONSILLECTOMY Bilateral 08/21/2019   Procedure: TONSILLECTOMY;  Surgeon: [name], MD;  Location: [place];  Service: ENT;  Laterality: Bilateral;  . WISDOM TOOTH EXTRACTION  SOCIAL HISTORY: Social History   Socioeconomic History  . Marital status: Married    Spouse name: Not on file  . Number of children: 1  . Years of education: Not on file  . Highest education level: Not on file  Occupational History  . Occupation: Clinical biochemist  Tobacco Use  . Smoking status: Never Smoker  . Smokeless tobacco: Never Used  Vaping Use  . Vaping Use: Never used  Substance and Sexual Activity  . Alcohol use: No  . Drug use: No  . Sexual activity: Not on file  Other Topics Concern  . Not on file  Social History Narrative   Patient is married and has 1 son.   Patient has never smoked, used tobacco products.   Patient does not drink.  Patient does not use illicit drugs.   Social Determinants  of Health   Financial Resource Strain: Not on file  Food Insecurity: Not on file  Transportation Needs: Not on file  Physical Activity: Not on file  Stress: Not on file  Social Connections: Not on file  Intimate Partner Violence: Not on file     FAMILY HISTORY: Family History  Problem Relation Age of Onset  . COPD Mother   . Uterine cancer Mother 66  . Lung cancer Brother 1       smoker  . Lung cancer Maternal Grandfather   . Stomach cancer Paternal Grandmother        d. 25s  . Cervical cancer Maternal Aunt   . Lung cancer Maternal Grandmother        smoker  . Prostate cancer Paternal Grandfather   . Pancreatic cancer Cousin 62       mother's maternal first cousin  . Stomach cancer Cousin 2       mother's maternal first cousin  . Cancer Cousin        mother's maternal first cousin with NOS cancer  . Cancer Other        MGMs sister with cancer NOS  . Lung cancer Other        smoker  . Cancer Other        mother's maternal grandfather with NOS cancer     ALLERGIES:   has No Known Allergies.   MEDICATIONS:  Current Outpatient Medications  Medication Sig Dispense Refill  . dicyclomine (BENTYL) 20 MG tablet Take 1 tablet (20 mg total) by mouth 4 (four) times daily as needed for spasms (abdominal or intestinal spasms). (Patient not taking: No sig reported) 40 tablet 0  . lidocaine-prilocaine (EMLA) cream  (Patient not taking: Reported on 03/27/2020)    . loratadine (EQ LORATADINE) 10 MG tablet Take 10 mg by mouth daily.  (Patient not taking: Reported on 03/21/2020)    . LORazepam (ATIVAN) 0.5 MG tablet Take 1-2 tablets (0.5-1 mg total) by mouth every 8 (eight) hours as needed for anxiety or sleep (chemotherapy related nausea). (Patient not taking: Reported on 03/27/2020) 60 tablet 0  . Nutritional Supplements (FEEDING SUPPLEMENT, OSMOLITE 1.5 CAL,) LIQD Begin Osmolite 1.5 via PEG.  Advance as tolerated to goal rate of 7 cartons daily in 4 feedings with 120 mL of free  water before and after each bolus feeding.  In addition mix 30 mL Prosource with 60 mL of water 4 times daily and infuse into PEG with 60 mL free water after each Prosource administration.  Drink an additional 10 ounces of water daily or put into PEG.  If it provides 2885 cal, 144.3 g protein  and 2887 mL free water/100% estimated needs. (Patient not taking: Reported on 03/21/2020) 1659 mL 3  . pantoprazole (PROTONIX) 40 MG tablet Take 1 tablet (40 mg total) by mouth daily before breakfast. (Patient not taking: Reported on 03/21/2020) 30 tablet 2  . sodium fluoride (FLUORISHIELD) 1.1 % GEL dental gel APPLY TO TOOTHBRUSH OR IN FLUORIDE TRAYS AS DIRECTED. REPEAT NIGHTLY. SPIT OUT EXCESS DO NOT SWALLOW. DO NOT RINSE AFTERWARDS (Patient not taking: Reported on 03/27/2020)    . sodium fluoride (PREVIDENT 5000 PLUS) 1.1 % CREA dental cream Apply to tooth brush or in fluoride trays as directed.  Repeat nightly. Spit out excess-DO NOT swallow. DO NOT rinse afterwards. (Patient not taking: Reported on 03/27/2020) 2 Tube prn   No current facility-administered medications for this visit.   Facility-Administered Medications Ordered in Other Visits  Medication Dose Route Frequency Provider Last Rate Last Admin  . 0.9 %  sodium chloride infusion   Intravenous Continuous Monia Sabal, PA-C         REVIEW OF SYSTEMS:   10 Point review of Systems was done is negative except as noted above.  PHYSICAL EXAMINATION: ECOG PERFORMANCE STATUS: 1 - Symptomatic but completely ambulatory  There were no vitals filed for this visit. There were no vitals filed for this visit. There is no height or weight on file to calculate BMI.   *** GENERAL:alert, in no acute distress and comfortable SKIN: no acute rashes, no significant lesions EYES: conjunctiva are pink and non-injected, sclera anicteric OROPHARYNX: MMM, no exudates, no oropharyngeal erythema or ulceration NECK: supple, no JVD LYMPH:  no palpable  lymphadenopathy in the cervical, axillary or inguinal regions LUNGS: clear to auscultation b/l with normal respiratory effort HEART: regular rate & rhythm ABDOMEN:  normoactive bowel sounds , non tender, not distended. Extremity: no pedal edema PSYCH: alert & oriented x 3 with fluent speech NEURO: no focal motor/sensory deficits   LABORATORY DATA:  I have reviewed the data as listed  CBC Latest Ref Rng & Units 05/28/2020 03/20/2020 02/08/2020  WBC 4.0 - 10.5 K/uL 5.1 3.7(L) 3.7(L)  Hemoglobin 13.0 - 17.0 g/dL 13.9 12.9(L) 12.6(L)  Hematocrit 39.0 - 52.0 % 40.8 37.9(L) 38.0(L)  Platelets 150 - 400 K/uL 180 179 188    CMP Latest Ref Rng & Units 05/28/2020 03/20/2020 02/08/2020  Glucose 70 - 99 mg/dL 94 104(H) 100(H)  BUN 6 - 20 mg/dL 16 20 16   Creatinine 0.61 - 1.24 mg/dL 1.07 1.05 1.11  Sodium 135 - 145 mmol/L 139 139 138  Potassium 3.5 - 5.1 mmol/L 4.0 3.9 4.0  Chloride 98 - 111 mmol/L 103 103 102  CO2 22 - 32 mmol/L 28 29 28   Calcium 8.9 - 10.3 mg/dL 9.3 9.3 9.2  Total Protein 6.5 - 8.1 g/dL 7.1 - -  Total Bilirubin 0.3 - 1.2 mg/dL 0.5 - -  Alkaline Phos 38 - 126 U/L 66 - -  AST 15 - 41 U/L 16 - -  ALT 0 - 44 U/L 15 - -     RADIOGRAPHIC STUDIES: I have personally reviewed the radiological images as listed and agreed with the findings in the report.  PET/CT 08/10/2019: IMPRESSION: 1. The 2.8 cm right level IIa lymph node has a maximum SUV of 28.4. A primary lesion is not readily seen. There is some asymmetric palatine tonsillar activity with the left tonsil having maximum SUV of 12.4 and the right 7.8, but no tonsillar mass is observed on the CT data. 2. No compelling findings of  active malignancy in the chest, abdomen/pelvis, or skeleton. There is some accentuated anal activity which is likely physiologic given the lack of visible abnormality on the CT data. 3. Other imaging findings of potential clinical significance: Diffuse hepatic steatosis. Hypodense left renal  lesion is probably a cyst but technically too small to characterize.   Electronically Signed   By: Van Clines M.D.   On: 08/10/2019 15:54   08/21/19 of Surgical Pathology [id number]    SURGICAL PATHOLOGY  * THIS IS AN AMENDED REPORT   CASE: [id number]  PATIENT: [facility]  Surgical Pathology Report   Amendment    Reason for Amendment #1: Other   Clinical History: squamous cell of neck and head (cm)     FINAL MICROSCOPIC DIAGNOSIS:   A. TONSIL, RIGHT:  - Squamous cell carcinoma, basaloid, p16 positive.  - Margins not involved.  - Carcinoma focally 1 mm from surgical margin.  - See oncology table and comment.   B. TONSIL, LEFT:  - Benign tonsil.  - No malignancy identified.   C. TONGUE, RIGHT BASE, BIOPSY:  - Benign tonsillar tissue.  - No malignancy identified.   D. TONGUE, LEFT BASE, BIOPSY:  - Benign tonsillar tissue.  - No malignancy identified.   ONCOLOGY TABLE:  PHARYNX (OROPHARYNX, HYPOPHARYNX, NASOPHARYNX):  Procedure: Right and left tonsillectomy and right and left tongue base  biopsies.  Tumor Site: Right tonsil.  Tumor Laterality: Right tonsil.  Tumor Focality: Unifocal.  Tumor Size: 1 cm x 0.3 cm (3 mm) depth.  Histologic Type: Squamous cell carcinoma, basaloid type, p16 positive,  EBV negative.  Histologic Grade: Poorly differentiated.  Margins: Free of tumor. Carcinoma focally 1 mm from the nearest  surgical margin.    Distance to closest Margin: 1 mm.  Lymphovascular Invasion: Present.  Perineural Invasion: Not identified.  Regional Lymph Nodes: No lymph nodes submitted with this case. The  previous right neck lymph node needle core biopsy (MCS 20 [phone]) shows  squamous cell carcinoma.  Pathologic Stage Classification (pTNM, AJCC 8th Edition): pT1, pN1, see  comment.    09/11/19 of Surgical Pathology Report [id number])  COMMENT:   Metastatic carcinoma is present in 3 lymph nodes - 1 level 1 lymph  node,  1 level 2 lymph node (this metastatic focus also involves skeletal  muscle), and 1 level 3 lymph node (highlighted by a cytokeratin  immunohistochemical stain). Additional studies can be performed (block  A11) upon clinician request.    ASSESSMENT & PLAN:   Dustin Leiter Sr. is a 55 y.o. male with:  1. Rt Tonsilar Basaloid Squamous cell carcinoma , HPV+ve. PT1,pN1,M0. 3/49 LN+ve High risk features - LVI, extranodal extension, high risk morphology. Significant second hand smoke exposure S/p b/l tonsillectomy on 08/21/2019 S/p rt radical neck dissection   PLAN: -Discussed pt labwork today, 06/20/2020; ***  -Will see back in ***   FOLLOW UP: ***    The total time spent in the appointment was *** minutes and more than 50% was on counseling and direct patient cares.   Sullivan Lone MD [place] AAHIVMS [facility] Hematology/Oncology Physician [facility]  (Office):       [phone] (Work cell):  [phone] (Fax):           [phone]  06/19/2020 10:51 AM  I, Reinaldo Raddle, am acting as scribe for Dr. Sullivan Lone, MD.

## 2020-06-20 ENCOUNTER — Other Ambulatory Visit: Payer: 59

## 2020-06-20 ENCOUNTER — Other Ambulatory Visit: Payer: Self-pay

## 2020-06-20 ENCOUNTER — Inpatient Hospital Stay: Payer: 59 | Admitting: Hematology

## 2020-06-20 ENCOUNTER — Inpatient Hospital Stay: Payer: 59 | Attending: Hematology

## 2020-06-20 DIAGNOSIS — C099 Malignant neoplasm of tonsil, unspecified: Secondary | ICD-10-CM | POA: Insufficient documentation

## 2020-06-20 LAB — CMP (CANCER CENTER ONLY)
ALT: 15 U/L (ref 0–44)
AST: 17 U/L (ref 15–41)
Albumin: 3.8 g/dL (ref 3.5–5.0)
Alkaline Phosphatase: 76 U/L (ref 38–126)
Anion gap: 9 (ref 5–15)
BUN: 20 mg/dL (ref 6–20)
CO2: 28 mmol/L (ref 22–32)
Calcium: 9 mg/dL (ref 8.9–10.3)
Chloride: 102 mmol/L (ref 98–111)
Creatinine: 1.25 mg/dL — ABNORMAL HIGH (ref 0.61–1.24)
GFR, Estimated: >60 mL/min (ref >60)
Glucose, Bld: 89 mg/dL (ref 70–99)
Potassium: 4.1 mmol/L (ref 3.5–5.1)
Sodium: 139 mmol/L (ref 135–145)
Total Bilirubin: 0.4 mg/dL (ref 0.3–1.2)
Total Protein: 7.3 g/dL (ref 6.5–8.1)

## 2020-06-20 LAB — CBC WITH DIFFERENTIAL/PLATELET
Abs Immature Granulocytes: 0.01 10*3/uL (ref 0.00–0.07)
Basophils Absolute: 0 10*3/uL (ref 0.0–0.1)
Basophils Relative: 1 %
Eosinophils Absolute: 0.2 10*3/uL (ref 0.0–0.5)
Eosinophils Relative: 4 %
HCT: 43.7 % (ref 39.0–52.0)
Hemoglobin: 14.5 g/dL (ref 13.0–17.0)
Immature Granulocytes: 0 %
Lymphocytes Relative: 19 %
Lymphs Abs: 1 10*3/uL (ref 0.7–4.0)
MCH: 29.3 pg (ref 26.0–34.0)
MCHC: 33.2 g/dL (ref 30.0–36.0)
MCV: 88.3 fL (ref 80.0–100.0)
Monocytes Absolute: 0.4 10*3/uL (ref 0.1–1.0)
Monocytes Relative: 8 %
Neutro Abs: 3.8 10*3/uL (ref 1.7–7.7)
Neutrophils Relative %: 68 %
Platelets: 185 10*3/uL (ref 150–400)
RBC: 4.95 MIL/uL (ref 4.22–5.81)
RDW: 13 % (ref 11.5–15.5)
WBC: 5.5 10*3/uL (ref 4.0–10.5)
nRBC: 0 % (ref 0.0–0.2)

## 2020-06-20 LAB — TSH: TSH: 3.507 u[IU]/mL (ref 0.320–4.118)

## 2020-06-20 LAB — T4, FREE: Free T4: 0.69 ng/dL (ref 0.61–1.12)

## 2020-06-20 LAB — MAGNESIUM: Magnesium: 1.8 mg/dL (ref 1.7–2.4)

## 2020-06-26 NOTE — Progress Notes (Signed)
This encounter was created in error - please disregard.

## 2020-08-07 ENCOUNTER — Encounter (HOSPITAL_COMMUNITY): Payer: Self-pay | Admitting: Emergency Medicine

## 2020-08-07 ENCOUNTER — Other Ambulatory Visit: Payer: Self-pay

## 2020-08-07 DIAGNOSIS — Z85818 Personal history of malignant neoplasm of other sites of lip, oral cavity, and pharynx: Secondary | ICD-10-CM | POA: Insufficient documentation

## 2020-08-07 DIAGNOSIS — K8020 Calculus of gallbladder without cholecystitis without obstruction: Secondary | ICD-10-CM | POA: Insufficient documentation

## 2020-08-07 DIAGNOSIS — R1011 Right upper quadrant pain: Secondary | ICD-10-CM | POA: Diagnosis present

## 2020-08-07 NOTE — ED Triage Notes (Signed)
Pt to the ED with abdominal pain onset at around 1730.

## 2020-08-08 ENCOUNTER — Emergency Department (HOSPITAL_COMMUNITY)
Admission: EM | Admit: 2020-08-08 | Discharge: 2020-08-08 | Disposition: A | Discharge status: Home / Self Care | Payer: 59 | Attending: Emergency Medicine | Admitting: Emergency Medicine

## 2020-08-08 ENCOUNTER — Emergency Department (HOSPITAL_COMMUNITY): Payer: 59

## 2020-08-08 DIAGNOSIS — K8020 Calculus of gallbladder without cholecystitis without obstruction: Secondary | ICD-10-CM

## 2020-08-08 DIAGNOSIS — R1011 Right upper quadrant pain: Secondary | ICD-10-CM

## 2020-08-08 LAB — URINALYSIS, ROUTINE W REFLEX MICROSCOPIC
Bilirubin Urine: NEGATIVE
Glucose, UA: NEGATIVE mg/dL
Hgb urine dipstick: NEGATIVE
Ketones, ur: NEGATIVE mg/dL
Leukocytes,Ua: NEGATIVE
Nitrite: NEGATIVE
Protein, ur: NEGATIVE mg/dL
Specific Gravity, Urine: 1.019 (ref 1.005–1.030)
pH: 6 (ref 5.0–8.0)

## 2020-08-08 LAB — COMPREHENSIVE METABOLIC PANEL
ALT: 21 U/L (ref 0–44)
AST: 25 U/L (ref 15–41)
Albumin: 4.1 g/dL (ref 3.5–5.0)
Alkaline Phosphatase: 65 U/L (ref 38–126)
Anion gap: 10 (ref 5–15)
BUN: 17 mg/dL (ref 6–20)
CO2: 25 mmol/L (ref 22–32)
Calcium: 8.7 mg/dL — ABNORMAL LOW (ref 8.9–10.3)
Chloride: 100 mmol/L (ref 98–111)
Creatinine, Ser: 1.12 mg/dL (ref 0.61–1.24)
GFR, Estimated: >60 mL/min (ref >60)
Glucose, Bld: 139 mg/dL — ABNORMAL HIGH (ref 70–99)
Potassium: 3.6 mmol/L (ref 3.5–5.1)
Sodium: 135 mmol/L (ref 135–145)
Total Bilirubin: 0.6 mg/dL (ref 0.3–1.2)
Total Protein: 7.5 g/dL (ref 6.5–8.1)

## 2020-08-08 LAB — CBC
HCT: 45.9 % (ref 39.0–52.0)
Hemoglobin: 15.2 g/dL (ref 13.0–17.0)
MCH: 29.2 pg (ref 26.0–34.0)
MCHC: 33.1 g/dL (ref 30.0–36.0)
MCV: 88.3 fL (ref 80.0–100.0)
Platelets: 194 10*3/uL (ref 150–400)
RBC: 5.2 MIL/uL (ref 4.22–5.81)
RDW: 13.2 % (ref 11.5–15.5)
WBC: 11.8 10*3/uL — ABNORMAL HIGH (ref 4.0–10.5)
nRBC: 0 % (ref 0.0–0.2)

## 2020-08-08 LAB — LIPASE, BLOOD: Lipase: 26 U/L (ref 11–51)

## 2020-08-08 MED ORDER — ONDANSETRON HCL 4 MG/2ML IJ SOLN
4.0000 mg | Freq: Once | INTRAMUSCULAR | Status: AC
  Administered 2020-08-08: 4 mg via INTRAVENOUS
  Filled 2020-08-08: qty 2

## 2020-08-08 MED ORDER — KETOROLAC TROMETHAMINE 30 MG/ML IJ SOLN
30.0000 mg | Freq: Once | INTRAMUSCULAR | Status: AC
  Administered 2020-08-08: 30 mg via INTRAVENOUS
  Filled 2020-08-08: qty 1

## 2020-08-08 MED ORDER — ONDANSETRON 4 MG PO TBDP: 4.0000 mg | ORAL_TABLET | Freq: Three times a day (TID) | ORAL | 0 refills | Status: DC | PRN

## 2020-08-08 MED ORDER — SENNOSIDES-DOCUSATE SODIUM 8.6-50 MG PO TABS: 1.0000 | ORAL_TABLET | Freq: Every evening | ORAL | 0 refills | Status: DC | PRN

## 2020-08-08 MED ORDER — OXYCODONE-ACETAMINOPHEN 5-325 MG PO TABS: 1.0000 | ORAL_TABLET | Freq: Four times a day (QID) | ORAL | 0 refills | Status: DC | PRN

## 2020-08-08 MED ORDER — LACTATED RINGERS IV BOLUS
1000.0000 mL | Freq: Once | INTRAVENOUS | Status: AC
  Administered 2020-08-08: 1000 mL via INTRAVENOUS

## 2020-08-08 MED ORDER — MORPHINE SULFATE (PF) 4 MG/ML IV SOLN
4.0000 mg | Freq: Once | INTRAVENOUS | Status: AC
  Administered 2020-08-08: 4 mg via INTRAVENOUS
  Filled 2020-08-08: qty 1

## 2020-08-08 MED ORDER — POTASSIUM CHLORIDE CRYS ER 20 MEQ PO TBCR: 40.0000 meq | EXTENDED_RELEASE_TABLET | Freq: Once | ORAL | Status: DC

## 2020-08-08 MED ORDER — MORPHINE SULFATE (PF) 4 MG/ML IV SOLN
4.0000 mg | Freq: Once | INTRAVENOUS | Status: AC
Start: 2020-08-08 — End: 2020-08-08
  Administered 2020-08-08: 4 mg via INTRAVENOUS
  Filled 2020-08-08: qty 1

## 2020-08-08 NOTE — Discharge Instructions (Signed)
You were seen in the emergency department today with pain in your abdomen.  Your ultrasound shows gallstones and sludge which may be causing your pain.  I am having you call the surgeon today to schedule a follow-up appointment as the gallbladder may ultimately need to be removed on an outpatient basis.  If you develop worsening pain, fevers, vomiting which is uncontrollable at home you should return to the emergency department for reevaluation.   You should avoid fatty foods as this can make your symptoms worse.  Please call your primary care doctor to make them aware of your symptoms and plan for surgery follow-up.

## 2020-08-08 NOTE — ED Provider Notes (Signed)
[facility] EMERGENCY DEPARTMENT Provider Note   CSN: [id number] Arrival date & time: 08/07/20  2043   History Chief Complaint  Patient presents with  . Abdominal Pain    Dustin Riviello Sr. is a 55 y.o. male.  The history is provided by the patient.  Abdominal Pain He has history of hyperlipidemia, gout, cancer of the tonsil he comes in complaining of right upper quadrant pain since about 4 PM.  Pain is severe and he rates it at 8/10.  There is no radiation of the pain.  There has been some nausea but no vomiting.  After pain came on, he did eat some steak and potatoes which did not seem to make the pain worse.  He had a similar episode 6 months ago and was given a prescription for dicyclomine which did not seem to give him any relief.  He actually had a milder episode about 1 week ago which did seem to get better following dicyclomine.  Past Medical History:  Diagnosis Date  . Arthritis   . Change in stool 02/22/2017  . Family history of cancer   . Family history of uterine cancer   . Gout 02/22/2017  . Hyperlipidemia   . Sleep apnea    Uses CPAP  . Tonsil cancer [facility]) DX'd 07/2019   metastatic    Patient Active Problem List   Diagnosis Date Noted  . Nephrolithiasis 03/21/2020  . Family history of uterine cancer   . Family history of cancer   . Port-A-Cath in place 12/28/2019  . Hematuria 12/18/2019  . Counseling regarding advance care planning and goals of care 10/12/2019  . Malignant neoplasm of tonsillar fossa ([place]) 09/11/2019  . S/P tonsillectomy 08/21/2019  . Squamous cell carcinoma of head and neck ([place]) 08/10/2019  . Morbid obesity ([place]) 06/08/2019  . Moderate mixed hyperlipidemia not requiring statin therapy 06/08/2019  . OSA (obstructive sleep apnea) 06/08/2019  . Change in stool 02/22/2017  . Gout 02/22/2017    Past Surgical History:  Procedure Laterality Date  . CARPAL TUNNEL RELEASE Right 04/10/2019  . DIRECT LARYNGOSCOPY N/A 08/21/2019   Procedure:  DIRECT LARYNGOSCOPY - WITH BX;  Surgeon: [name], MD;  Location: [place];  Service: ENT;  Laterality: N/A;  . IR GASTROSTOMY TUBE MOD SED  10/13/2019  . IR GASTROSTOMY TUBE REMOVAL  06/10/2020  . IR IMAGING GUIDED PORT INSERTION  10/13/2019  . IR REMOVAL TUN ACCESS W/ PORT W/O FL MOD SED  06/10/2020  . NECK DISSECTION  09/11/2019  . RADICAL NECK DISSECTION Right 09/11/2019   Procedure: RADICAL NECK DISSECTION;  Surgeon: [name], MD;  Location: [place];  Service: ENT;  Laterality: Right;  . TONSILLECTOMY Bilateral 08/21/2019   Procedure: TONSILLECTOMY;  Surgeon: [name], MD;  Location: [place];  Service: ENT;  Laterality: Bilateral;  . WISDOM TOOTH EXTRACTION         Family History  Problem Relation Age of Onset  . COPD Mother   . Uterine cancer Mother 31  . Lung cancer Brother 64       smoker  . Lung cancer Maternal Grandfather   . Stomach cancer Paternal Grandmother        d. 26s  . Cervical cancer Maternal Aunt   . Lung cancer Maternal Grandmother        smoker  . Prostate cancer Paternal Grandfather   . Pancreatic cancer Cousin 42       mother's maternal first cousin  . Stomach  cancer Cousin 55       mother's maternal first cousin  . Cancer Cousin        mother's maternal first cousin with NOS cancer  . Cancer Other        MGMs sister with cancer NOS  . Lung cancer Other        smoker  . Cancer Other        mother's maternal grandfather with NOS cancer    Social History   Tobacco Use  . Smoking status: Never Smoker  . Smokeless tobacco: Never Used  Vaping Use  . Vaping Use: Never used  Substance Use Topics  . Alcohol use: No  . Drug use: No    Home Medications Prior to Admission medications   Medication Sig Start Date End Date Taking? Authorizing Provider  dicyclomine (BENTYL) 20 MG tablet Take 1 tablet (20 mg total) by mouth 4 (four) times daily as needed for spasms (abdominal or intestinal spasms). Patient not  taking: No sig reported 02/01/20   Rolland Porter, MD  lidocaine-prilocaine (EMLA) cream  10/18/19   [provider]  loratadine (EQ LORATADINE) 10 MG tablet Take 10 mg by mouth daily.  Patient not taking: Reported on 03/21/2020    [provider]  LORazepam (ATIVAN) 0.5 MG tablet Take 1-2 tablets (0.5-1 mg total) by mouth every 8 (eight) hours as needed for anxiety or sleep (chemotherapy related nausea). Patient not taking: Reported on 03/27/2020 12/05/19   Brunetta Genera, MD  Nutritional Supplements (FEEDING SUPPLEMENT, OSMOLITE 1.5 CAL,) LIQD Begin Osmolite 1.5 via PEG.  Advance as tolerated to goal rate of 7 cartons daily in 4 feedings with 120 mL of free water before and after each bolus feeding.  In addition mix 30 mL Prosource with 60 mL of water 4 times daily and infuse into PEG with 60 mL free water after each Prosource administration.  Drink an additional 10 ounces of water daily or put into PEG.  If it provides 2885 cal, 144.3 g protein and 2887 mL free water/100% estimated needs. Patient not taking: Reported on 03/21/2020 11/10/19   Eppie Gibson, MD  pantoprazole (PROTONIX) 40 MG tablet Take 1 tablet (40 mg total) by mouth daily before breakfast. Patient not taking: Reported on 03/21/2020 10/25/19   Brunetta Genera, MD  sodium fluoride (FLUORISHIELD) 1.1 % GEL dental gel APPLY TO TOOTHBRUSH OR IN FLUORIDE TRAYS AS DIRECTED. REPEAT NIGHTLY. SPIT OUT EXCESS DO NOT SWALLOW. DO NOT RINSE AFTERWARDS Patient not taking: Reported on 03/27/2020 10/17/19   [provider]  sodium fluoride (PREVIDENT 5000 PLUS) 1.1 % CREA dental cream Apply to tooth brush or in fluoride trays as directed.  Repeat nightly. Spit out excess-DO NOT swallow. DO NOT rinse afterwards. Patient not taking: Reported on 03/27/2020 08/18/19   Lenn Cal, DDS    Allergies    Patient has no known allergies.  Review of Systems   Review of Systems  Gastrointestinal: Positive for abdominal pain.   All other systems reviewed and are negative.   Physical Exam Updated Vital Signs BP (!) 172/87 (BP Location: Right Arm)   Pulse 72   Temp 98.3 F (36.8 C) (Oral)   Resp 19   Ht 5\' 8"  (1.727 m)   Wt 113.4 kg   SpO2 100%   BMI 38.01 kg/m   Physical Exam Vitals and nursing note reviewed.   55 year old male, resting comfortably and in no acute distress. Vital signs are significant for elevated blood  pressure. Oxygen saturation is 100%, which is normal. Head is normocephalic and atraumatic. PERRLA, EOMI. Oropharynx is clear. Neck is nontender and supple without adenopathy or JVD. Back is nontender and there is no CVA tenderness. Lungs are clear without rales, wheezes, or rhonchi. Chest is nontender. Heart has regular rate and rhythm without murmur. Abdomen is soft, flat, with moderate right upper quadrant tenderness.  There is no rebound or guarding.  There is a +/- Murphy sign.  Masses or hepatosplenomegaly and peristalsis is hypoactive. Extremities have no cyanosis or edema, full range of motion is present. Skin is warm and dry without rash. Neurologic: Mental status is normal, cranial nerves are intact, there are no motor or sensory deficits.  ED Results / Procedures / Treatments   Labs (all labs ordered are listed, but only abnormal results are displayed) Labs Reviewed  COMPREHENSIVE METABOLIC PANEL - Abnormal; Notable for the following components:      Result Value   Glucose, Bld 139 (*)    Calcium 8.7 (*)    All other components within normal limits  CBC - Abnormal; Notable for the following components:   WBC 11.8 (*)    All other components within normal limits  LIPASE, BLOOD  URINALYSIS, ROUTINE W REFLEX MICROSCOPIC    EKG EKG Interpretation  Date/Time:  Thursday August 08 2020 01:43:46 EDT Ventricular Rate:  70 PR Interval:    QRS Duration: 104 QT Interval:  399 QTC Calculation: 431 R Axis:   82 Text Interpretation: Normal sinus rhythm Normal ECG No old  tracing to compare Confirmed by Delora Fuel ([id number]) on 08/08/2020 1:55:06 AM   Radiology No results found.  Procedures Procedures   Medications Ordered in ED Medications - No data to display  ED Course  I have reviewed the triage vital signs and the nursing notes.  Pertinent labs & imaging results that were available during my care of the patient were reviewed by me and considered in my medical decision making (see chart for details).  MDM Rules/Calculators/A&P Right upper quadrant pain concerning for cholelithiasis and cholecystitis.  Old records are reviewed confirming prior ED visit for right upper quadrant pain with CT scan showing a nonobstructing right renal calculus and otherwise unremarkable.  Other diagnostic possibilities include peptic ulcer disease, pancreatitis, diverticulitis.  Will check screening labs, give IV fluids, morphine, ondansetron.  At this point, I do not feel CT scan needs to be repeated.  Labs are reassuring.  He had good relief of pain with morphine.  He will be sent for right upper quadrant ultrasound.  Case is signed out to Dr. Laverta Baltimore.  Final Clinical Impression(s) / ED Diagnoses Final diagnoses:  RUQ pain    Rx / DC Orders ED Discharge Orders    None       Delora Fuel, MD 85/02/77 [id number]

## 2020-08-08 NOTE — ED Provider Notes (Signed)
Blood pressure 123/71, pulse 78, temperature 98.3 F (36.8 C), temperature source Oral, resp. rate 19, height 5\' 8"  (1.727 m), weight 113.4 kg, SpO2 97 %.  Assuming care from Dr. Roxanne Mins.  In short, Dustin Marquis Sr. is a 55 y.o. male with a chief complaint of Abdominal Pain .  Refer to the original H&P for additional details.  The current plan of care is to f/u on RUQ [place] and reassess.  09:32 AM   IMPRESSION: 1. Multiple gallstones and sludge. No gallbladder wall thickening. Negative Murphy sign. No biliary distention.  2. Heterogeneous hepatic parenchymal pattern consistent fatty infiltration or hepatocellular disease.   Electronically Signed By: Marcello Moores Register On: 08/08/2020 08:29  Ultrasound resulted showing multiple gallstones and sludge.  There is no wall thickening or Murphy sign to suspect acute cholecystitis.  Possibly related to symptomatic cholelithiasis.  Will refer to general surgery as an outpatient.  Bilirubin and LFTs are normal.   Reviewed the [facility] drug database and provided a small number of Percocet for severe pain.  Patient symptoms are very well controlled here in the emergency department he is feeling well.  We discussed his symptoms, plan to avoid fatty foods, plan to call surgery this morning to schedule a follow-up appointment.  Discussed ED return precautions in detail.   Margette Fast, MD 08/08/20 [phone]

## 2020-08-19 ENCOUNTER — Inpatient Hospital Stay: Payer: 59 | Admitting: Medical

## 2020-08-20 ENCOUNTER — Encounter: Payer: Self-pay | Admitting: General Surgery

## 2020-08-20 ENCOUNTER — Ambulatory Visit: Payer: 59 | Admitting: General Surgery

## 2020-08-20 ENCOUNTER — Other Ambulatory Visit: Payer: Self-pay

## 2020-08-20 VITALS — BP 120/81 | HR 74 | Temp 97.9°F | Resp 12 | Ht 68.0 in | Wt 252.0 lb

## 2020-08-20 DIAGNOSIS — K8020 Calculus of gallbladder without cholecystitis without obstruction: Secondary | ICD-10-CM | POA: Diagnosis not present

## 2020-08-20 NOTE — Progress Notes (Signed)
[facility] History and Physical  Reason for Referral: Gallstones  Referring Physician: ED  Chief Complaint    New Patient (Initial Visit)      Dustin Conradt Sr. is a 54 y.o. male.  HPI: Dustin Rios is a 55 yo with a history of tonsillar cancer s/p radical neck dissection, tonsillectomy, chemotherapy and radiation, temporary PEG tube who comes in with complaints of RUQ pain and associated nausea and vomiting during his episodes  He had an episode in December and went to the ED and had a normal CT aside from a kidney stone.   He was seen back at the ED a few weeks ago after eating a banana sandwich and was found to have gallstones on [place].  He denies any issues with swallowing after his radiation, but says he can only extend his neck so much due to fibrosis. CT neck 02/2020 without any signs of tracheal narrowing.   He lost about 60 lbs after during his cancer treatment. He had his port and PEG removed 05/2020. He denies any chest pain or SOB.   Past Medical History:  Diagnosis Date  . Arthritis   . Change in stool 02/22/2017  . Family history of cancer   . Family history of uterine cancer   . Gout 02/22/2017  . Hyperlipidemia   . Sleep apnea    Uses CPAP  . Tonsil cancer [facility]) DX'd 07/2019   metastatic    Past Surgical History:  Procedure Laterality Date  . CARPAL TUNNEL RELEASE Right 04/10/2019  . DIRECT LARYNGOSCOPY N/A 08/21/2019   Procedure: DIRECT LARYNGOSCOPY - WITH BX;  Surgeon: [name], MD;  Location: [place];  Service: ENT;  Laterality: N/A;  . IR GASTROSTOMY TUBE MOD SED  10/13/2019  . IR GASTROSTOMY TUBE REMOVAL  06/10/2020  . IR IMAGING GUIDED PORT INSERTION  10/13/2019  . IR REMOVAL TUN ACCESS W/ PORT W/O FL MOD SED  06/10/2020  . NECK DISSECTION  09/11/2019  . RADICAL NECK DISSECTION Right 09/11/2019   Procedure: RADICAL NECK DISSECTION;  Surgeon: [name], MD;  Location: [place];  Service: ENT;  Laterality: Right;  .  TONSILLECTOMY Bilateral 08/21/2019   Procedure: TONSILLECTOMY;  Surgeon: [name], MD;  Location: [place];  Service: ENT;  Laterality: Bilateral;  . WISDOM TOOTH EXTRACTION      Family History  Problem Relation Age of Onset  . COPD Mother   . Uterine cancer Mother 47  . Lung cancer Brother 47       smoker  . Lung cancer Maternal Grandfather   . Stomach cancer Paternal Grandmother        d. 55s  . Cervical cancer Maternal Aunt   . Lung cancer Maternal Grandmother        smoker  . Prostate cancer Paternal Grandfather   . Pancreatic cancer Cousin 50       mother's maternal first cousin  . Stomach cancer Cousin 12       mother's maternal first cousin  . Cancer Cousin        mother's maternal first cousin with NOS cancer  . Cancer Other        MGMs sister with cancer NOS  . Lung cancer Other        smoker  . Cancer Other        mother's maternal grandfather with NOS cancer    Social History   Tobacco Use  . Smoking status: Never Smoker  .  Smokeless tobacco: Never Used  Vaping Use  . Vaping Use: Never used  Substance Use Topics  . Alcohol use: No  . Drug use: No    Medications: I have reviewed the patient's current medications. Allergies as of 08/20/2020   No Known Allergies     Medication List       Accurate as of August 20, 2020 11:17 AM. If you have any questions, ask your nurse or doctor.        STOP taking these medications   dicyclomine 20 MG tablet Commonly known as: BENTYL Stopped by: [name], MD   feeding supplement (OSMOLITE 1.5 CAL) Liqd Stopped by: [name], MD   LORazepam 0.5 MG tablet Commonly known as: ATIVAN Stopped by: [name], MD   ondansetron 4 MG disintegrating tablet Commonly known as: Zofran ODT Stopped by: [name], MD   oxyCODONE-acetaminophen 5-325 MG tablet Commonly known as: PERCOCET/ROXICET Stopped by: [name], MD   pantoprazole 40 MG tablet Commonly  known as: Protonix Stopped by: [name], MD   senna-docusate 8.6-50 MG tablet Commonly known as: Senokot-S Stopped by: [name], MD   sodium fluoride 1.1 % Crea dental cream Commonly known as: PreviDent 5000 Plus Stopped by: [name], MD     TAKE these medications   ibuprofen 200 MG tablet Commonly known as: ADVIL Take 200 mg by mouth every 6 (six) hours as needed for fever, headache or mild pain.   loratadine 10 MG tablet Commonly known as: CLARITIN Take 10 mg by mouth daily.   Mitigare 0.6 MG Caps Generic drug: Colchicine Take by mouth as needed.        ROS:  A comprehensive review of systems was negative except for: Ears, nose, mouth, throat, and face: positive for dry mouth, throat Gastrointestinal: positive for abdominal pain and nausea Musculoskeletal: positive for neck pain and limited ability to extend neck  Blood pressure 120/81, pulse 74, temperature 97.9 F (36.6 C), temperature source Other (Comment), resp. rate 12, height 5\' 8"  (1.727 m), weight 252 lb (114.3 kg), SpO2 94 %. Physical Exam Vitals reviewed.  Constitutional:      Appearance: Normal appearance.  HENT:     Head: Normocephalic.     Nose: Nose normal.     Mouth/Throat:     Mouth: Mucous membranes are dry.  Eyes:     Extraocular Movements: Extraocular movements intact.  Neck:     Comments: Extended back but some limitation, fibrosis of SCM bilateral Cardiovascular:     Rate and Rhythm: Normal rate and regular rhythm.  Pulmonary:     Effort: Pulmonary effort is normal.     Breath sounds: Normal breath sounds.  Abdominal:     General: There is no distension.     Palpations: Abdomen is soft.     Tenderness: There is abdominal tenderness in the right upper quadrant.  Musculoskeletal:        General: No swelling. Normal range of motion.  Skin:    General: Skin is warm.  Neurological:     General: No focal deficit present.     Mental Status: He is alert and  oriented to person, place, and time.     Results: CLINICAL DATA:  Right upper quadrant pain.  EXAM: ULTRASOUND ABDOMEN LIMITED RIGHT UPPER QUADRANT  COMPARISON:  CT 02/01/2020.  FINDINGS: Gallbladder:  Multiple gallstones and sludge. Gallbladder wall thickness 2.5 mm. Negative Murphy sign.  Common bile duct:  Diameter:  3.6 mm  Liver:  Heterogeneous hepatic parenchymal pattern consistent fatty infiltration or hepatocellular disease. Portal vein is patent on color Doppler imaging with normal direction of blood flow towards the liver.  Other: None.  IMPRESSION: 1. Multiple gallstones and sludge. No gallbladder wall thickening. Negative Murphy sign. No biliary distention.  2. Heterogeneous hepatic parenchymal pattern consistent fatty infiltration or hepatocellular disease.   Electronically Signed   By: Marcello Moores  Register   On: 08/08/2020 08:29  CT neck 02/2020 reviewed - no signs of tracheal narrowing  Assessment & Plan:  Dustin Mccreadie Sr. is a 55 y.o. male with signs of gallstones on [place] and symptoms consistent with gallstones. He has a prior history of neck dissection and radiation.   I discussed the case with Dr. Charna Elizabeth anesthesia who wants to see patient in the preop testing but says that intubation should be ok with a glide scope.    PLAN: I counseled the patient about the indication, risks and benefits of laparoscopic cholecystectomy.  He understands there is a very small chance for bleeding, infection, injury to normal structures (including common bile duct), conversion to open surgery, persistent symptoms, evolution of postcholecystectomy diarrhea, need for secondary interventions, anesthesia reaction, cardiopulmonary issues and other risks not specifically detailed here. I described the expected recovery, the plan for follow-up and the restrictions during the recovery phase.  All questions were answered.  Discussed COVID testing.  All questions  were answered to the satisfaction of the patient and family.   Will plan for surgery in the upcoming weeks.    [name] 08/20/2020, 11:17 AM

## 2020-08-20 NOTE — Patient Instructions (Signed)
Cholelithiasis  Cholelithiasis happens when gallstones form in the gallbladder. The gallbladder stores bile. Bile is a fluid that helps digest fats. Bile can harden and form into gallstones. If they cause a blockage, they can cause pain (gallbladder attack). What are the causes? This condition may be caused by:  Some blood diseases, such as sickle cell anemia.  Too much of a fat-like substance (cholesterol) in your bile.  Not enough bile salts in your bile. These salts help the body absorb and digest fats.  The gallbladder not emptying fully or often enough. This is common in pregnant women. What increases the risk? The following factors may make you more likely to develop this condition:  Being male.  Being pregnant many times.  Eating a lot of fried foods, fat, and refined carbs (refined carbohydrates).  Being very overweight (obese).  Being older than age 66.  Using medicines with male hormones in them for a long time.  Losing weight fast.  Having gallstones in your family.  Having some health problems, such as diabetes, Crohn's disease, or liver disease. What are the signs or symptoms? Often, there may be gallstones but no symptoms. These gallstones are called silent gallstones. If a gallstone causes a blockage, you may get sudden pain. The pain:  Can be in the upper right part of your belly (abdomen).  Normally comes at night or after you eat.  Can last an hour or more.  Can spread to your right shoulder, back, or chest.  Can feel like discomfort, burning, or fullness in the upper part of your belly (indigestion). If the blockage lasts more than a few hours, you can get an infection or swelling. You may:  Feel like you may vomit.  Vomit.  Feel bloated.  Have belly pain for 5 hours or more.  Feel tender in your belly, often in the upper right part and under your ribs.  Have fever or chills.  Have skin or the white parts of your eyes turn yellow  (jaundice).  Have dark pee (urine) or pale poop (stool). How is this treated? Treatment for this condition depends on how bad you feel. If you have symptoms, you may need:  Home care, if symptoms are not very bad. ? Do not eat for 12-24 hours. Drink only water and clear liquids. ? Start to eat simple or clear foods after 1 or 2 days. Try broths and crackers. ? You may need medicines for pain or stomach upset or both. ? If you have an infection, you will need antibiotics.  A hospital stay, if you have very bad pain or a very bad infection.  Surgery to remove your gallbladder. You may need this if: ? Gallstones keep coming back. ? You have very bad symptoms.  Medicines to break up gallstones. Medicines: ? Are best for small gallstones. ? May be used for up to 6-12 months.  A procedure to find and take out gallstones or to break up gallstones. Follow these instructions at home: Medicines  Take over-the-counter and prescription medicines only as told by your doctor.  If you were prescribed an antibiotic medicine, take it as told by your doctor. Do not stop taking the antibiotic even if you start to feel better.  Ask your doctor if the medicine prescribed to you requires you to avoid driving or using machinery. Eating and drinking  Drink enough fluid to keep your urine pale yellow. Drink water or clear fluids. This is important when you have pain.  Eat  healthy foods. Choose: ? Fewer fatty foods, such as fried foods. ? Fewer refined carbs. Avoid breads and grains that are highly processed, such as white bread and white rice. Choose whole grains, such as whole-wheat bread and brown rice. ? More fiber. Almonds, fresh fruit, and beans are healthy sources. General instructions  Keep a healthy weight.  Keep all follow-up visits as told by your doctor. This is important. Where to find more information  [employer] of Diabetes and Digestive and Kidney Diseases:  [email or web address] Contact a doctor if:  You have sudden pain in the upper right part of your belly. Pain might spread to your right shoulder, back, or chest.  You have been diagnosed with gallstones that have no symptoms and you get: ? Belly pain. ? Discomfort, burning, or fullness in the upper part of your abdomen.  You have dark urine or pale stools. Get help right away if:  You have sudden pain in the upper right part of your abdomen, and the pain lasts more than 2 hours.  You have pain in your abdomen, and: ? It lasts more than 5 hours. ? It keeps getting worse.  You have a fever or chills.  You keep feeling like you may vomit.  You keep vomiting.  Your skin or the white parts of your eyes turn yellow. Summary  Cholelithiasis happens when gallstones form in the gallbladder.  This condition may be caused by a blood disease, too much of a fat-like substance in the bile, or not enough bile salts in bile.  Treatment for this condition depends on how bad you feel.  If you have symptoms, do not eat or drink. You may need medicines. You may need a hospital stay for very bad pain or a very bad infection.  You may need surgery if gallstones keep coming back or if you have very bad symptoms. This information is not intended to replace advice given to you by your health care provider. Make sure you discuss any questions you have with your health care provider. Document Revised: 06/16/2019 Document Reviewed: 03/20/2019 Elsevier Patient Education  2021 [employer].   Minimally Invasive Cholecystectomy Minimally invasive cholecystectomy is surgery to remove the gallbladder. The gallbladder is a pear-shaped organ that lies beneath the liver on the right side of the body. The gallbladder stores bile, which is a fluid that helps the body digest fats. Cholecystectomy is often done to treat inflammation of the gallbladder (cholecystitis). This condition is usually caused by a buildup of  gallstones (cholelithiasis) in the gallbladder. Gallstones can block the flow of bile, which can result in inflammation and pain. In severe cases, emergency surgery may be required. This procedure is done though small incisions in the abdomen, instead of one large incision. It is also called laparoscopic surgery. A thin scope with a camera (laparoscope) is inserted through one incision. Then surgical instruments are inserted through the other incisions. In some cases, a minimally invasive surgery may need to be changed to a surgery that is done through a larger incision. This is called open surgery. Tell a health care provider about:  Any allergies you have.  All medicines you are taking, including vitamins, herbs, eye drops, creams, and over-the-counter medicines.  Any problems you or family members have had with anesthetic medicines.  Any blood disorders you have.  Any surgeries you have had.  Any medical conditions you have.  Whether you are pregnant or may be pregnant. What are the risks? Generally, this is a  safe procedure. However, problems may occur, including:  Infection.  Bleeding.  Allergic reactions to medicines.  Damage to nearby structures or organs.  A stone remaining in the common bile duct. The common bile duct carries bile from the gallbladder into the small intestine.  A bile leak from the cyst duct that is clipped when your gallbladder is removed. Medicines Ask your health care provider about:  Changing or stopping your regular medicines. This is especially important if you are taking diabetes medicines or blood thinners.  Taking medicines such as aspirin and ibuprofen. These medicines can thin your blood. Do not take these medicines unless your health care provider tells you to take them.  Taking over-the-counter medicines, vitamins, herbs, and supplements. General instructions  Let your health care provider know if you develop a cold or an infection  before surgery.  Plan to have someone take you home from the hospital or clinic.  If you will be going home right after the procedure, plan to have someone with you for 24 hours.  Ask your health care provider: ? How your surgery site will be marked. ? What steps will be taken to help prevent infection. These may include:  Removing hair at the surgery site.  Washing skin with a germ-killing soap.  Taking antibiotic medicine. What happens during the procedure?  An IV will be inserted into one of your veins.  You will be given one or both of the following: ? A medicine to help you relax (sedative). ? A medicine to make you fall asleep (general anesthetic).  A breathing tube will be placed in your mouth.  Your surgeon will make several small incisions in your abdomen.  The laparoscope will be inserted through one of the small incisions. The camera on the laparoscope will send images to a monitor in the operating room. This lets your surgeon see inside your abdomen.  A gas will be pumped into your abdomen. This will expand your abdomen to give the surgeon more room to perform the surgery.  Other tools that are needed for the procedure will be inserted through the other incisions. The gallbladder will be removed through one of the incisions.  Your common bile duct may be examined. If stones are found in the common bile duct, they may be removed.  After your gallbladder has been removed, the incisions will be closed with stitches (sutures), staples, or skin glue.  Your incisions may be covered with a bandage (dressing). The procedure may vary among health care providers and hospitals.   What happens after the procedure?  Your blood pressure, heart rate, breathing rate, and blood oxygen level will be monitored until you leave the hospital or clinic.  You will be given medicines as needed to control your pain.  If you were given a sedative during the procedure, it can affect you  for several hours. Do not drive or operate machinery until your health care provider says that it is safe. Summary  Minimally invasive cholecystectomy, also called laparoscopic cholecystectomy, is surgery to remove the gallbladder using small incisions.  Tell your health care provider about all the medical conditions you have and all the medicines you are taking for those conditions.  Before the procedure, follow instructions about eating or drinking restrictions and changing or stopping medicines.  If you were given a sedative during the procedure, it can affect you for several hours. Do not drive or operate machinery until your health care provider says that it is safe. This  information is not intended to replace advice given to you by your health care provider. Make sure you discuss any questions you have with your health care provider. Document Revised: 01/30/2019 Document Reviewed: 01/30/2019 Elsevier Patient Education  [place].

## 2020-08-27 ENCOUNTER — Inpatient Hospital Stay: Payer: 59 | Attending: Medical | Admitting: Medical

## 2020-08-27 ENCOUNTER — Other Ambulatory Visit: Payer: Self-pay

## 2020-08-27 VITALS — BP 120/83 | HR 79 | Temp 97.8°F | Resp 16 | Ht 68.0 in | Wt 252.8 lb

## 2020-08-27 DIAGNOSIS — M47812 Spondylosis without myelopathy or radiculopathy, cervical region: Secondary | ICD-10-CM | POA: Insufficient documentation

## 2020-08-27 DIAGNOSIS — R682 Dry mouth, unspecified: Secondary | ICD-10-CM | POA: Diagnosis not present

## 2020-08-27 DIAGNOSIS — R609 Edema, unspecified: Secondary | ICD-10-CM | POA: Diagnosis not present

## 2020-08-27 DIAGNOSIS — C090 Malignant neoplasm of tonsillar fossa: Secondary | ICD-10-CM | POA: Diagnosis present

## 2020-08-27 DIAGNOSIS — Z923 Personal history of irradiation: Secondary | ICD-10-CM | POA: Diagnosis not present

## 2020-08-27 DIAGNOSIS — Z791 Long term (current) use of non-steroidal anti-inflammatories (NSAID): Secondary | ICD-10-CM | POA: Insufficient documentation

## 2020-08-27 DIAGNOSIS — Z79899 Other long term (current) drug therapy: Secondary | ICD-10-CM | POA: Diagnosis not present

## 2020-08-27 DIAGNOSIS — K760 Fatty (change of) liver, not elsewhere classified: Secondary | ICD-10-CM | POA: Diagnosis not present

## 2020-08-27 DIAGNOSIS — M2578 Osteophyte, vertebrae: Secondary | ICD-10-CM | POA: Diagnosis not present

## 2020-08-27 NOTE — H&P (Signed)
[facility] History and Physical  Reason for Referral: Gallstones  Referring Physician: ED     Chief Complaint    New Patient (Initial Visit)      Dustin Strutz Sr. is a 55 y.o. male.  HPI: Dustin Rios is a 55 yo with a history of tonsillar cancer s/p radical neck dissection, tonsillectomy, chemotherapy and radiation, temporary PEG tube who comes in with complaints of RUQ pain and associated nausea and vomiting during his episodes  He had an episode in December and went to the ED and had a normal CT aside from a kidney stone.   He was seen back at the ED a few weeks ago after eating a banana sandwich and was found to have gallstones on [place].  He denies any issues with swallowing after his radiation, but says he can only extend his neck so much due to fibrosis. CT neck 02/2020 without any signs of tracheal narrowing.   He lost about 60 lbs after during his cancer treatment. He had his port and PEG removed 05/2020. He denies any chest pain or SOB.       Past Medical History:  Diagnosis Date  . Arthritis   . Change in stool 02/22/2017  . Family history of cancer   . Family history of uterine cancer   . Gout 02/22/2017  . Hyperlipidemia   . Sleep apnea    Uses CPAP  . Tonsil cancer [facility]) DX'd 07/2019   metastatic         Past Surgical History:  Procedure Laterality Date  . CARPAL TUNNEL RELEASE Right 04/10/2019  . DIRECT LARYNGOSCOPY N/A 08/21/2019   Procedure: DIRECT LARYNGOSCOPY - WITH BX;  Surgeon: [name], MD;  Location: [facility];  Service: ENT;  Laterality: N/A;  . IR GASTROSTOMY TUBE MOD SED  10/13/2019  . IR GASTROSTOMY TUBE REMOVAL  06/10/2020  . IR IMAGING GUIDED PORT INSERTION  10/13/2019  . IR REMOVAL TUN ACCESS W/ PORT W/O FL MOD SED  06/10/2020  . NECK DISSECTION  09/11/2019  . RADICAL NECK DISSECTION Right 09/11/2019   Procedure: RADICAL NECK DISSECTION;  Surgeon: [name], MD;  Location: [facility];   Service: ENT;  Laterality: Right;  . TONSILLECTOMY Bilateral 08/21/2019   Procedure: TONSILLECTOMY;  Surgeon: [name], MD;  Location: [facility];  Service: ENT;  Laterality: Bilateral;  . WISDOM TOOTH EXTRACTION           Family History  Problem Relation Age of Onset  . COPD Mother   . Uterine cancer Mother 74  . Lung cancer Brother 6       smoker  . Lung cancer Maternal Grandfather   . Stomach cancer Paternal Grandmother        d. 79s  . Cervical cancer Maternal Aunt   . Lung cancer Maternal Grandmother        smoker  . Prostate cancer Paternal Grandfather   . Pancreatic cancer Cousin 45       mother's maternal first cousin  . Stomach cancer Cousin 75       mother's maternal first cousin  . Cancer Cousin        mother's maternal first cousin with NOS cancer  . Cancer Other        MGMs sister with cancer NOS  . Lung cancer Other        smoker  . Cancer Other        mother's maternal grandfather with NOS cancer  Social History       Tobacco Use  . Smoking status: Never Smoker  . Smokeless tobacco: Never Used  Vaping Use  . Vaping Use: Never used  Substance Use Topics  . Alcohol use: No  . Drug use: No    Medications: I have reviewed the patient's current medications. Allergies as of 08/20/2020   No Known Allergies        Medication List       Accurate as of August 20, 2020 11:17 AM. If you have any questions, ask your nurse or doctor.        STOP taking these medications   dicyclomine 20 MG tablet Commonly known as: BENTYL Stopped by: [name], MD   feeding supplement (OSMOLITE 1.5 CAL) Liqd Stopped by: [name], MD   LORazepam 0.5 MG tablet Commonly known as: ATIVAN Stopped by: [name], MD   ondansetron 4 MG disintegrating tablet Commonly known as: Zofran ODT Stopped by: [name], MD   oxyCODONE-acetaminophen 5-325 MG tablet Commonly known as:  PERCOCET/ROXICET Stopped by: [name], MD   pantoprazole 40 MG tablet Commonly known as: Protonix Stopped by: [name], MD   senna-docusate 8.6-50 MG tablet Commonly known as: Senokot-S Stopped by: [name], MD   sodium fluoride 1.1 % Crea dental cream Commonly known as: PreviDent 5000 Plus Stopped by: [name], MD     TAKE these medications   ibuprofen 200 MG tablet Commonly known as: ADVIL Take 200 mg by mouth every 6 (six) hours as needed for fever, headache or mild pain.   loratadine 10 MG tablet Commonly known as: CLARITIN Take 10 mg by mouth daily.   Mitigare 0.6 MG Caps Generic drug: Colchicine Take by mouth as needed.        ROS:  A comprehensive review of systems was negative except for: Ears, nose, mouth, throat, and face: positive for dry mouth, throat Gastrointestinal: positive for abdominal pain and nausea Musculoskeletal: positive for neck pain and limited ability to extend neck  Blood pressure 120/81, pulse 74, temperature 97.9 F (36.6 C), temperature source Other (Comment), resp. rate 12, height 5\' 8"  (1.727 m), weight 252 lb (114.3 kg), SpO2 94 %. Physical Exam Vitals reviewed.  Constitutional:      Appearance: Normal appearance.  HENT:     Head: Normocephalic.     Nose: Nose normal.     Mouth/Throat:     Mouth: Mucous membranes are dry.  Eyes:     Extraocular Movements: Extraocular movements intact.  Neck:     Comments: Extended back but some limitation, fibrosis of SCM bilateral Cardiovascular:     Rate and Rhythm: Normal rate and regular rhythm.  Pulmonary:     Effort: Pulmonary effort is normal.     Breath sounds: Normal breath sounds.  Abdominal:     General: There is no distension.     Palpations: Abdomen is soft.     Tenderness: There is abdominal tenderness in the right upper quadrant.  Musculoskeletal:        General: No swelling. Normal range of motion.  Skin:    General: Skin  is warm.  Neurological:     General: No focal deficit present.     Mental Status: He is alert and oriented to person, place, and time.     Results: CLINICAL DATA: Right upper quadrant pain.  EXAM: ULTRASOUND ABDOMEN LIMITED RIGHT UPPER QUADRANT  COMPARISON: CT 02/01/2020.  FINDINGS: Gallbladder:  Multiple gallstones and sludge. Gallbladder wall thickness 2.5 mm. Negative Murphy sign.  Common bile duct:  Diameter: 3.6 mm  Liver:  Heterogeneous hepatic parenchymal pattern consistent fatty infiltration or hepatocellular disease. Portal vein is patent on color Doppler imaging with normal direction of blood flow towards the liver.  Other: None.  IMPRESSION: 1. Multiple gallstones and sludge. No gallbladder wall thickening. Negative Murphy sign. No biliary distention.  2. Heterogeneous hepatic parenchymal pattern consistent fatty infiltration or hepatocellular disease.   Electronically Signed By: Marcello Moores Register On: 08/08/2020 08:29  CT neck 02/2020 reviewed - no signs of tracheal narrowing  Assessment & Plan:  Dustin Babington Sr. is a 55 y.o. male with signs of gallstones on [place] and symptoms consistent with gallstones. He has a prior history of neck dissection and radiation.   I discussed the case with Dr. Charna Elizabeth anesthesia who wants to see patient in the preop testing but says that intubation should be ok with a glide scope.    PLAN: I counseled the patient about the indication, risks and benefits of laparoscopic cholecystectomy.  He understands there is a very small chance for bleeding, infection, injury to normal structures (including common bile duct), conversion to open surgery, persistent symptoms, evolution of postcholecystectomy diarrhea, need for secondary interventions, anesthesia reaction, cardiopulmonary issues and other risks not specifically detailed here. I described the expected recovery, the plan for follow-up and the  restrictions during the recovery phase.  All questions were answered.  Discussed COVID testing.  All questions were answered to the satisfaction of the patient and family.   Will plan for surgery in the upcoming weeks.    [name] 08/20/2020, 11:17 AM

## 2020-09-02 ENCOUNTER — Other Ambulatory Visit (HOSPITAL_COMMUNITY): Payer: 59

## 2020-09-02 ENCOUNTER — Encounter (HOSPITAL_COMMUNITY): Admission: RE | Admit: 2020-09-02 | Payer: 59 | Source: Ambulatory Visit

## 2020-09-03 ENCOUNTER — Telehealth: Payer: Self-pay

## 2020-09-03 NOTE — Telephone Encounter (Signed)
Reviewed patient chart. Patient only had limited RUQ u/s completed. No sufficient for the management of kidney stones.  Left message for patient to return call.

## 2020-09-03 NOTE — Patient Instructions (Addendum)
Your procedure is scheduled on: 09/09/2020  Report to [facility] at  9:45   AM.  Call this number if you have problems the morning of surgery: [phone]   Remember:   Do not Eat or Drink after midnight         No Smoking the morning of surgery  :  Take these medicines the morning of surgery with A SIP OF WATER: loratadine (claritin) if needed   Do not wear jewelry, make-up or nail polish.  Do not wear lotions, powders, or perfumes. You may wear deodorant.  Do not shave 48 hours prior to surgery. Men may shave face and neck.  Do not bring valuables to the hospital.  Contacts, dentures or bridgework may not be worn into surgery.  Leave suitcase in the car. After surgery it may be brought to your room.  For patients admitted to the hospital, checkout time is 11:00 AM the day of discharge.   Patients discharged the day of surgery will not be allowed to drive home.    Special Instructions: Shower using CHG night before surgery and shower the day of surgery use CHG.  Use special wash - you have one bottle of CHG for all showers.  You should use approximately 1/2 of the bottle for each shower.  How to Use Chlorhexidine for Bathing Chlorhexidine gluconate (CHG) is a germ-killing (antiseptic) solution that is used to clean the skin. It can get rid of the bacteria that normally live on the skin and can keep them away for about 24 hours. To clean your skin with CHG, you may be given:  A CHG solution to use in the shower or as part of a sponge bath.  A prepackaged cloth that contains CHG. Cleaning your skin with CHG may help lower the risk for infection:  While you are staying in the intensive care unit of the hospital.  If you have a vascular access, such as a central line, to provide short-term or long-term access to your veins.  If you have a catheter to drain urine from your bladder.  If you are on a ventilator. A ventilator is a machine that helps you breathe by moving air in and  out of your lungs.  After surgery. What are the risks? Risks of using CHG include:  A skin reaction.  Hearing loss, if CHG gets in your ears.  Eye injury, if CHG gets in your eyes and is not rinsed out.  The CHG product catching fire. Make sure that you avoid smoking and flames after applying CHG to your skin. Do not use CHG:  If you have a chlorhexidine allergy or have previously reacted to chlorhexidine.  On babies younger than 15 months of age. How to use CHG solution  Use CHG only as told by your health care provider, and follow the instructions on the label.  Use the full amount of CHG as directed. Usually, this is one bottle. During a shower Follow these steps when using CHG solution during a shower (unless your health care provider gives you different instructions): 1. Start the shower. 2. Use your normal soap and shampoo to wash your face and hair. 3. Turn off the shower or move out of the shower stream. 4. Pour the CHG onto a clean washcloth. Do not use any type of brush or rough-edged sponge. 5. Starting at your neck, lather your body down to your toes. Make sure you follow these instructions: ? If you will be having surgery,  pay special attention to the part of your body where you will be having surgery. Scrub this area for at least 1 minute. ? Do not use CHG on your head or face. If the solution gets into your ears or eyes, rinse them well with water. ? Avoid your genital area. ? Avoid any areas of skin that have broken skin, cuts, or scrapes. ? Scrub your back and under your arms. Make sure to wash skin folds. 6. Let the lather sit on your skin for 1-2 minutes or as long as told by your health care provider. 7. Thoroughly rinse your entire body in the shower. Make sure that all body creases and crevices are rinsed well. 8. Dry off with a clean towel. Do not put any substances on your body afterward--such as powder, lotion, or perfume--unless you are told to do so by  your health care provider. Only use lotions that are recommended by the manufacturer. 9. Put on clean clothes or pajamas. 10. If it is the night before your surgery, sleep in clean sheets.   During a sponge bath Follow these steps when using CHG solution during a sponge bath (unless your health care provider gives you different instructions): 1. Use your normal soap and shampoo to wash your face and hair. 2. Pour the CHG onto a clean washcloth. 3. Starting at your neck, lather your body down to your toes. Make sure you follow these instructions: ? If you will be having surgery, pay special attention to the part of your body where you will be having surgery. Scrub this area for at least 1 minute. ? Do not use CHG on your head or face. If the solution gets into your ears or eyes, rinse them well with water. ? Avoid your genital area. ? Avoid any areas of skin that have broken skin, cuts, or scrapes. ? Scrub your back and under your arms. Make sure to wash skin folds. 4. Let the lather sit on your skin for 1-2 minutes or as long as told by your health care provider. 5. Using a different clean, wet washcloth, thoroughly rinse your entire body. Make sure that all body creases and crevices are rinsed well. 6. Dry off with a clean towel. Do not put any substances on your body afterward--such as powder, lotion, or perfume--unless you are told to do so by your health care provider. Only use lotions that are recommended by the manufacturer. 7. Put on clean clothes or pajamas. 8. If it is the night before your surgery, sleep in clean sheets. How to use CHG prepackaged cloths  Only use CHG cloths as told by your health care provider, and follow the instructions on the label.  Use the CHG cloth on clean, dry skin.  Do not use the CHG cloth on your head or face unless your health care provider tells you to.  When washing with the CHG cloth: ? Avoid your genital area. ? Avoid any areas of skin that have  broken skin, cuts, or scrapes. Before surgery Follow these steps when using a CHG cloth to clean before surgery (unless your health care provider gives you different instructions): 1. Using the CHG cloth, vigorously scrub the part of your body where you will be having surgery. Scrub using a back-and-forth motion for 3 minutes. The area on your body should be completely wet with CHG when you are done scrubbing. 2. Do not rinse. Discard the cloth and let the area air-dry. Do not put any substances  on the area afterward, such as powder, lotion, or perfume. 3. Put on clean clothes or pajamas. 4. If it is the night before your surgery, sleep in clean sheets.   For general bathing Follow these steps when using CHG cloths for general bathing (unless your health care provider gives you different instructions). 1. Use a separate CHG cloth for each area of your body. Make sure you wash between any folds of skin and between your fingers and toes. Wash your body in the following order, switching to a new cloth after each step: ? The front of your neck, shoulders, and chest. ? Both of your arms, under your arms, and your hands. ? Your stomach and groin area, avoiding the genitals. ? Your right leg and foot. ? Your left leg and foot. ? The back of your neck, your back, and your buttocks. 2. Do not rinse. Discard the cloth and let the area air-dry. Do not put any substances on your body afterward--such as powder, lotion, or perfume--unless you are told to do so by your health care provider. Only use lotions that are recommended by the manufacturer. 3. Put on clean clothes or pajamas. Contact a health care provider if:  Your skin gets irritated after scrubbing.  You have questions about using your solution or cloth. Get help right away if:  Your eyes become very red or swollen.  Your eyes itch badly.  Your skin itches badly and is red or swollen.  Your hearing changes.  You have trouble  seeing.  You have swelling or tingling in your mouth or throat.  You have trouble breathing.  You swallow any chlorhexidine. Summary  Chlorhexidine gluconate (CHG) is a germ-killing (antiseptic) solution that is used to clean the skin. Cleaning your skin with CHG may help to lower your risk for infection.  You may be given CHG to use for bathing. It may be in a bottle or in a prepackaged cloth to use on your skin. Carefully follow your health care provider's instructions and the instructions on the product label.  Do not use CHG if you have a chlorhexidine allergy.  Contact your health care provider if your skin gets irritated after scrubbing. This information is not intended to replace advice given to you by your health care provider. Make sure you discuss any questions you have with your health care provider. Document Revised: 10/13/2019 Document Reviewed: 10/13/2019 Elsevier Patient Education  2021 [employer]  Minimally Invasive Cholecystectomy, Care After This sheet gives you information about how to care for yourself after your procedure. Your doctor may also give you more specific instructions. If you have problems or questions, contact your doctor. What can I expect after the procedure? After the procedure, it is common: To have pain at the areas of surgery. You will be given medicines for pain. To vomit or feel like you may vomit. To feel fullness in the belly (bloating) or to have pain in the shoulder. This comes from the gas that was used during the surgery. Follow these instructions at home: Medicines Take over-the-counter and prescription medicines only as told by your doctor. If you were prescribed an antibiotic medicine, take it as told by your doctor. Do not stop using the antibiotic even if you start to feel better. Ask your doctor if the medicine prescribed to you: Requires you to avoid driving or using machinery. Can cause trouble pooping (constipation). You may  need to take these actions to prevent or treat trouble pooping: Drink enough  fluid to keep your pee (urine) pale yellow. Take over-the-counter or prescription medicines. Eat foods that are high in fiber. These include beans, whole grains, and fresh fruits and vegetables. Limit foods that are high in fat and sugar. These include fried or sweet foods. Incision care Follow instructions from your doctor about how to take care of your cuts from surgery (incisions). Make sure you: Wash your hands with soap and water for at least 20 seconds before and after you change your bandage (dressing). If you cannot use soap and water, use hand sanitizer. Change your bandage as told by your doctor. Leave stitches (sutures), skin glue, or skin tape (adhesive) strips in place. They may need to stay in place for 2 weeks or longer. If tape strips get loose and curl up, you may trim the loose edges. Do not remove tape strips completely unless your doctor says it is okay. Do not take baths, swim, or use a hot tub until your doctor approves. Ask your doctor if you may take showers. You may only be allowed to take sponge baths. Check your surgery area every day for signs of infection. Check for: More redness, swelling, or pain. Fluid or blood. Warmth. Pus or a bad smell.   Activity Rest as told by your doctor. Do not sit for a long time without moving. Get up to take short walks every 1-2 hours. This is important. Ask for help if you feel weak or unsteady. Do not lift anything that is heavier than 10 lb (4.5 kg), or the limit that you are told, until your doctor says that it is safe. Do not play contact sports until your doctor says it is okay. Do not return to work or school until your doctor says it is okay. Return to your normal activities as told by your doctor. Ask your doctor what activities are safe for you. General instructions If you were given a medicine to help you relax (sedative) during your procedure,  it can affect you for many hours. Do not drive or use machinery until your doctor says that it is safe. Keep all follow-up visits as told by your doctor. This is important. Contact a doctor if: You get a rash. You have more redness, swelling, or pain around your cuts from surgery. You have fluid or blood coming from your cuts from surgery. Your cuts from surgery feel warm to the touch. You have pus or a bad smell coming from your cuts from surgery. You have a fever. One or more of your cuts from surgery breaks open. Get help right away if: You have trouble breathing. You have chest pain. You have pain that is getting worse in your shoulders. You faint or feel dizzy when you stand. You have very bad pain in your belly (abdomen). You feel like you may vomit or you vomit, and this lasts for more than one day. You have leg pain. Summary After your surgery, it is common to have pain at the areas of surgery. You may also have vomiting or fullness in the belly. Follow your doctor's instructions about medicine, activity restrictions, and caring for your surgery areas. Do not do activities that require a lot of effort. Contact a doctor if you have a fever or other signs of infection, such as more redness, swelling, or pain around the cuts from surgery. Get help right away if you have chest pain, increasing pain in the shoulders, or trouble breathing. This information is not intended to replace advice  given to you by your health care provider. Make sure you discuss any questions you have with your health care provider. Document Revised: 04/11/2019 Document Reviewed: 04/11/2019 Elsevier Patient Education  2021 [employer] Anesthesia, Adult, Care After This sheet gives you information about how to care for yourself after your procedure. Your health care provider may also give you more specific instructions. If you have problems or questions, contact your health care provider. What can I  expect after the procedure? After the procedure, the following side effects are common:  Pain or discomfort at the IV site.  Nausea.  Vomiting.  Sore throat.  Trouble concentrating.  Feeling cold or chills.  Feeling weak or tired.  Sleepiness and fatigue.  Soreness and body aches. These side effects can affect parts of the body that were not involved in surgery. Follow these instructions at home: For the time period you were told by your health care provider:  Rest.  Do not participate in activities where you could fall or become injured.  Do not drive or use machinery.  Do not drink alcohol.  Do not take sleeping pills or medicines that cause drowsiness.  Do not make important decisions or sign legal documents.  Do not take care of children on your own.   Eating and drinking  Follow any instructions from your health care provider about eating or drinking restrictions.  When you feel hungry, start by eating small amounts of foods that are soft and easy to digest (bland), such as toast. Gradually return to your regular diet.  Drink enough fluid to keep your urine pale yellow.  If you vomit, rehydrate by drinking water, juice, or clear broth. General instructions  If you have sleep apnea, surgery and certain medicines can increase your risk for breathing problems. Follow instructions from your health care provider about wearing your sleep device: ? Anytime you are sleeping, including during daytime naps. ? While taking prescription pain medicines, sleeping medicines, or medicines that make you drowsy.  Have a responsible adult stay with you for the time you are told. It is important to have someone help care for you until you are awake and alert.  Return to your normal activities as told by your health care provider. Ask your health care provider what activities are safe for you.  Take over-the-counter and prescription medicines only as told by your health care  provider.  If you smoke, do not smoke without supervision.  Keep all follow-up visits as told by your health care provider. This is important. Contact a health care provider if:  You have nausea or vomiting that does not get better with medicine.  You cannot eat or drink without vomiting.  You have pain that does not get better with medicine.  You are unable to pass urine.  You develop a skin rash.  You have a fever.  You have redness around your IV site that gets worse. Get help right away if:  You have difficulty breathing.  You have chest pain.  You have blood in your urine or stool, or you vomit blood. Summary  After the procedure, it is common to have a sore throat or nausea. It is also common to feel tired.  Have a responsible adult stay with you for the time you are told. It is important to have someone help care for you until you are awake and alert.  When you feel hungry, start by eating small amounts of foods that are soft and  easy to digest (bland), such as toast. Gradually return to your regular diet.  Drink enough fluid to keep your urine pale yellow.  Return to your normal activities as told by your health care provider. Ask your health care provider what activities are safe for you. This information is not intended to replace advice given to you by your health care provider. Make sure you discuss any questions you have with your health care provider. Document Revised: 01/11/2020 Document Reviewed: 08/10/2019 Elsevier Patient Education  2021 [employer].

## 2020-09-03 NOTE — Telephone Encounter (Signed)
Patent was in [facility] for Gallbladder issues recently.  Wondering if his imaging may have what is needed for his imaging for his kidney stones.  Having gallbladder surgery next week.  Thanks, Helene Kelp

## 2020-09-04 NOTE — Telephone Encounter (Signed)
Mychart message sent.

## 2020-09-05 ENCOUNTER — Encounter (HOSPITAL_COMMUNITY)
Admission: RE | Admit: 2020-09-05 | Discharge: 2020-09-05 | Disposition: A | Discharge status: Home / Self Care | Payer: 59 | Source: Ambulatory Visit | Attending: General Surgery | Admitting: General Surgery

## 2020-09-05 ENCOUNTER — Other Ambulatory Visit: Payer: Self-pay

## 2020-09-05 ENCOUNTER — Other Ambulatory Visit (HOSPITAL_COMMUNITY)
Admission: RE | Admit: 2020-09-05 | Discharge: 2020-09-05 | Disposition: A | Discharge status: Home / Self Care | Payer: 59 | Source: Ambulatory Visit | Attending: General Surgery | Admitting: General Surgery

## 2020-09-05 ENCOUNTER — Encounter (HOSPITAL_COMMUNITY): Payer: Self-pay

## 2020-09-05 DIAGNOSIS — Z01812 Encounter for preprocedural laboratory examination: Secondary | ICD-10-CM | POA: Insufficient documentation

## 2020-09-05 DIAGNOSIS — Z20822 Contact with and (suspected) exposure to covid-19: Secondary | ICD-10-CM | POA: Insufficient documentation

## 2020-09-05 LAB — SARS CORONAVIRUS 2 (TAT 6-24 HRS): SARS Coronavirus 2: NEGATIVE

## 2020-09-05 NOTE — Progress Notes (Signed)
Patient came in, I swabbed him, Covid order was on hold, I release it, and printed requisition. Patient has to have a Covid screening before the Dr. Lilian Coma proceed with his procedure.

## 2020-09-06 ENCOUNTER — Ambulatory Visit (HOSPITAL_COMMUNITY): Payer: 59

## 2020-09-09 ENCOUNTER — Ambulatory Visit (HOSPITAL_COMMUNITY)
Admission: RE | Admit: 2020-09-09 | Discharge: 2020-09-09 | Disposition: A | Discharge status: Home / Self Care | Payer: 59 | Attending: General Surgery | Admitting: General Surgery

## 2020-09-09 ENCOUNTER — Encounter (HOSPITAL_COMMUNITY): Payer: Self-pay | Admitting: General Surgery

## 2020-09-09 ENCOUNTER — Ambulatory Visit (HOSPITAL_COMMUNITY): Payer: 59 | Admitting: Anesthesiology

## 2020-09-09 ENCOUNTER — Encounter (HOSPITAL_COMMUNITY)
Admission: RE | Disposition: A | Discharge status: Home / Self Care | Payer: Self-pay | Source: Home / Self Care | Attending: General Surgery

## 2020-09-09 DIAGNOSIS — Z809 Family history of malignant neoplasm, unspecified: Secondary | ICD-10-CM | POA: Diagnosis not present

## 2020-09-09 DIAGNOSIS — Z8049 Family history of malignant neoplasm of other genital organs: Secondary | ICD-10-CM | POA: Insufficient documentation

## 2020-09-09 DIAGNOSIS — G4733 Obstructive sleep apnea (adult) (pediatric): Secondary | ICD-10-CM | POA: Diagnosis not present

## 2020-09-09 DIAGNOSIS — Z85818 Personal history of malignant neoplasm of other sites of lip, oral cavity, and pharynx: Secondary | ICD-10-CM | POA: Diagnosis not present

## 2020-09-09 DIAGNOSIS — K429 Umbilical hernia without obstruction or gangrene: Secondary | ICD-10-CM | POA: Insufficient documentation

## 2020-09-09 DIAGNOSIS — E785 Hyperlipidemia, unspecified: Secondary | ICD-10-CM | POA: Insufficient documentation

## 2020-09-09 DIAGNOSIS — K8010 Calculus of gallbladder with chronic cholecystitis without obstruction: Secondary | ICD-10-CM | POA: Diagnosis not present

## 2020-09-09 DIAGNOSIS — Z801 Family history of malignant neoplasm of trachea, bronchus and lung: Secondary | ICD-10-CM | POA: Insufficient documentation

## 2020-09-09 DIAGNOSIS — K8020 Calculus of gallbladder without cholecystitis without obstruction: Secondary | ICD-10-CM | POA: Diagnosis present

## 2020-09-09 DIAGNOSIS — Z923 Personal history of irradiation: Secondary | ICD-10-CM | POA: Diagnosis not present

## 2020-09-09 DIAGNOSIS — Z8042 Family history of malignant neoplasm of prostate: Secondary | ICD-10-CM | POA: Diagnosis not present

## 2020-09-09 DIAGNOSIS — Z9221 Personal history of antineoplastic chemotherapy: Secondary | ICD-10-CM | POA: Insufficient documentation

## 2020-09-09 DIAGNOSIS — Z800 Family history of malignant neoplasm of digestive organs: Secondary | ICD-10-CM | POA: Diagnosis not present

## 2020-09-09 DIAGNOSIS — Z79899 Other long term (current) drug therapy: Secondary | ICD-10-CM | POA: Insufficient documentation

## 2020-09-09 HISTORY — PX: CHOLECYSTECTOMY: SHX55

## 2020-09-09 HISTORY — PX: UMBILICAL HERNIA REPAIR: SHX196

## 2020-09-09 SURGERY — LAPAROSCOPIC CHOLECYSTECTOMY
Anesthesia: General

## 2020-09-09 MED ORDER — ONDANSETRON HCL 4 MG/2ML IJ SOLN
INTRAMUSCULAR | Status: DC | PRN
  Administered 2020-09-09: 4 mg via INTRAVENOUS

## 2020-09-09 MED ORDER — SUGAMMADEX SODIUM 500 MG/5ML IV SOLN
INTRAVENOUS | Status: DC | PRN
  Administered 2020-09-09: 200 mg via INTRAVENOUS

## 2020-09-09 MED ORDER — PROPOFOL 10 MG/ML IV BOLUS
INTRAVENOUS | Status: DC | PRN
  Administered 2020-09-09: 160 mg via INTRAVENOUS
  Administered 2020-09-09: 40 mg via INTRAVENOUS

## 2020-09-09 MED ORDER — OXYCODONE HCL 5 MG PO TABS: 5.0000 mg | ORAL_TABLET | ORAL | 0 refills | Status: DC | PRN

## 2020-09-09 MED ORDER — MIDAZOLAM HCL 2 MG/2ML IJ SOLN
INTRAMUSCULAR | Status: AC
  Filled 2020-09-09: qty 2

## 2020-09-09 MED ORDER — CHLORHEXIDINE GLUCONATE CLOTH 2 % EX PADS: 6.0000 | MEDICATED_PAD | Freq: Once | CUTANEOUS | Status: DC

## 2020-09-09 MED ORDER — HEMOSTATIC AGENTS (NO CHARGE) OPTIME
TOPICAL | Status: DC | PRN
  Administered 2020-09-09: 1 via TOPICAL

## 2020-09-09 MED ORDER — MEPERIDINE HCL 50 MG/ML IJ SOLN: 6.2500 mg | INTRAMUSCULAR | Status: DC | PRN

## 2020-09-09 MED ORDER — FENTANYL CITRATE (PF) 100 MCG/2ML IJ SOLN
INTRAMUSCULAR | Status: DC | PRN
  Administered 2020-09-09: 100 ug via INTRAVENOUS
  Administered 2020-09-09 (×5): 50 ug via INTRAVENOUS

## 2020-09-09 MED ORDER — ONDANSETRON HCL 4 MG PO TABS: 4.0000 mg | ORAL_TABLET | Freq: Three times a day (TID) | ORAL | 1 refills | Status: DC | PRN

## 2020-09-09 MED ORDER — ROCURONIUM BROMIDE 10 MG/ML (PF) SYRINGE
PREFILLED_SYRINGE | INTRAVENOUS | Status: AC
  Filled 2020-09-09: qty 10

## 2020-09-09 MED ORDER — SUCCINYLCHOLINE CHLORIDE 200 MG/10ML IV SOSY
PREFILLED_SYRINGE | INTRAVENOUS | Status: AC
  Filled 2020-09-09: qty 10

## 2020-09-09 MED ORDER — SODIUM CHLORIDE 0.9 % IR SOLN
Status: DC | PRN
  Administered 2020-09-09: 1000 mL

## 2020-09-09 MED ORDER — FENTANYL CITRATE (PF) 250 MCG/5ML IJ SOLN
INTRAMUSCULAR | Status: AC
  Filled 2020-09-09: qty 5

## 2020-09-09 MED ORDER — SUCCINYLCHOLINE CHLORIDE 20 MG/ML IJ SOLN
INTRAMUSCULAR | Status: DC | PRN
  Administered 2020-09-09: 140 mg via INTRAVENOUS

## 2020-09-09 MED ORDER — LIDOCAINE HCL (CARDIAC) PF 50 MG/5ML IV SOSY
PREFILLED_SYRINGE | INTRAVENOUS | Status: DC | PRN
  Administered 2020-09-09: 50 mg via INTRAVENOUS

## 2020-09-09 MED ORDER — BUPIVACAINE HCL (PF) 0.5 % IJ SOLN
INTRAMUSCULAR | Status: DC | PRN
  Administered 2020-09-09: 10 mL

## 2020-09-09 MED ORDER — FENTANYL CITRATE (PF) 100 MCG/2ML IJ SOLN
INTRAMUSCULAR | Status: AC
  Filled 2020-09-09: qty 2

## 2020-09-09 MED ORDER — SODIUM CHLORIDE 0.9 % IV SOLN
2.0000 g | INTRAVENOUS | Status: AC
  Administered 2020-09-09: 2 g via INTRAVENOUS
  Filled 2020-09-09: qty 2

## 2020-09-09 MED ORDER — HYDROMORPHONE HCL 1 MG/ML IJ SOLN
0.2500 mg | INTRAMUSCULAR | Status: DC | PRN
  Administered 2020-09-09 (×3): 0.5 mg via INTRAVENOUS
  Filled 2020-09-09 (×3): qty 0.5

## 2020-09-09 MED ORDER — LACTATED RINGERS IV SOLN
INTRAVENOUS | Status: DC
  Administered 2020-09-09: 1000 mL via INTRAVENOUS

## 2020-09-09 MED ORDER — DEXAMETHASONE SODIUM PHOSPHATE 10 MG/ML IJ SOLN
INTRAMUSCULAR | Status: DC | PRN
  Administered 2020-09-09: 10 mg via INTRAVENOUS

## 2020-09-09 MED ORDER — BUPIVACAINE HCL (PF) 0.5 % IJ SOLN
INTRAMUSCULAR | Status: AC
  Filled 2020-09-09: qty 30

## 2020-09-09 MED ORDER — ORAL CARE MOUTH RINSE: 15.0000 mL | Freq: Once | OROMUCOSAL | Status: AC

## 2020-09-09 MED ORDER — ONDANSETRON HCL 4 MG/2ML IJ SOLN: 4.0000 mg | Freq: Once | INTRAMUSCULAR | Status: DC | PRN

## 2020-09-09 MED ORDER — LIDOCAINE HCL (PF) 2 % IJ SOLN
INTRAMUSCULAR | Status: AC
  Filled 2020-09-09: qty 5

## 2020-09-09 MED ORDER — ROCURONIUM BROMIDE 100 MG/10ML IV SOLN
INTRAVENOUS | Status: DC | PRN
  Administered 2020-09-09: 40 mg via INTRAVENOUS

## 2020-09-09 MED ORDER — CHLORHEXIDINE GLUCONATE 0.12 % MT SOLN
15.0000 mL | Freq: Once | OROMUCOSAL | Status: AC
  Administered 2020-09-09: 15 mL via OROMUCOSAL

## 2020-09-09 SURGICAL SUPPLY — 44 items
ADH SKN CLS APL DERMABOND .7 (GAUZE/BANDAGES/DRESSINGS) ×2
APL PRP STRL LF DISP 70% ISPRP (MISCELLANEOUS) ×2
APPLIER CLIP ROT 10 11.4 M/L (STAPLE) ×3
APR CLP MED LRG 11.4X10 (STAPLE) ×2
BAG RETRIEVAL 10 (BASKET) ×1
BLADE SURG 15 STRL LF DISP TIS (BLADE) ×2 IMPLANT
BLADE SURG 15 STRL SS (BLADE) ×3
CHLORAPREP W/TINT 26 (MISCELLANEOUS) ×3 IMPLANT
CLIP APPLIE ROT 10 11.4 M/L (STAPLE) ×2 IMPLANT
CLOTH BEACON ORANGE TIMEOUT ST (SAFETY) ×3 IMPLANT
COVER LIGHT HANDLE STERIS (MISCELLANEOUS) ×6 IMPLANT
COVER WAND RF STERILE (DRAPES) ×3 IMPLANT
DECANTER SPIKE VIAL GLASS SM (MISCELLANEOUS) ×3 IMPLANT
DERMABOND ADVANCED (GAUZE/BANDAGES/DRESSINGS) ×1
DERMABOND ADVANCED .7 DNX12 (GAUZE/BANDAGES/DRESSINGS) ×2 IMPLANT
ELECT REM PT RETURN 9FT ADLT (ELECTROSURGICAL) ×3
ELECTRODE REM PT RTRN 9FT ADLT (ELECTROSURGICAL) ×2 IMPLANT
GLOVE SURG ENC MOIS LTX SZ6.5 (GLOVE) ×3 IMPLANT
GLOVE SURG UNDER POLY LF SZ6.5 (GLOVE) ×3 IMPLANT
GLOVE SURG UNDER POLY LF SZ7 (GLOVE) ×9 IMPLANT
GOWN STRL REUS W/TWL LRG LVL3 (GOWN DISPOSABLE) ×9 IMPLANT
HEMOSTAT SNOW SURGICEL 2X4 (HEMOSTASIS) ×3 IMPLANT
INST SET LAPROSCOPIC AP (KITS) ×3 IMPLANT
KIT TURNOVER KIT A (KITS) ×3 IMPLANT
MANIFOLD NEPTUNE II (INSTRUMENTS) ×3 IMPLANT
NDL INSUFFLATION 14GA 120MM (NEEDLE) ×2 IMPLANT
NEEDLE INSUFFLATION 14GA 120MM (NEEDLE) ×3 IMPLANT
NS IRRIG 1000ML POUR BTL (IV SOLUTION) ×3 IMPLANT
PACK LAP CHOLE LZT030E (CUSTOM PROCEDURE TRAY) ×3 IMPLANT
PAD ARMBOARD 7.5X6 YLW CONV (MISCELLANEOUS) ×3 IMPLANT
PENCIL HANDSWITCHING (ELECTRODE) ×1 IMPLANT
SET BASIN LINEN APH (SET/KITS/TRAYS/PACK) ×3 IMPLANT
SET TUBE SMOKE EVAC HIGH FLOW (TUBING) ×3 IMPLANT
SLEEVE ENDOPATH XCEL 5M (ENDOMECHANICALS) ×3 IMPLANT
SUT ETHIBOND NAB MO 7 #0 18IN (SUTURE) ×1 IMPLANT
SUT MNCRL AB 4-0 PS2 18 (SUTURE) ×6 IMPLANT
SUT VICRYL 0 UR6 27IN ABS (SUTURE) ×3 IMPLANT
SYS BAG RETRIEVAL 10MM (BASKET) ×2
SYSTEM BAG RETRIEVAL 10MM (BASKET) ×2 IMPLANT
TROCAR ENDO BLADELESS 11MM (ENDOMECHANICALS) ×3 IMPLANT
TROCAR XCEL NON-BLD 5MMX100MML (ENDOMECHANICALS) ×3 IMPLANT
TROCAR XCEL UNIV SLVE 11M 100M (ENDOMECHANICALS) ×3 IMPLANT
TUBE CONNECTING 12X1/4 (SUCTIONS) ×3 IMPLANT
WARMER LAPAROSCOPE (MISCELLANEOUS) ×3 IMPLANT

## 2020-09-09 NOTE — Anesthesia Procedure Notes (Signed)
Procedure Name: Intubation Date/Time: 09/09/2020 10:23 AM Performed by: Vista Deck, CRNA Pre-anesthesia Checklist: Patient identified, Emergency Drugs available, Suction available, Patient being monitored and Timeout performed Patient Re-evaluated:Patient Re-evaluated prior to induction Oxygen Delivery Method: Circle system utilized Preoxygenation: Pre-oxygenation with 100% oxygen Induction Type: IV induction Laryngoscope Size: Glidescope and 3 Grade View: Grade I Tube type: Oral Tube size: 7.0 mm Number of attempts: 1 Airway Equipment and Method: Video-laryngoscopy,  Stylet and Oral airway Placement Confirmation: ETT inserted through vocal cords under direct vision,  positive ETCO2 and breath sounds checked- equal and bilateral Secured at: 23 cm Tube secured with: Tape Dental Injury: Teeth and Oropharynx as per pre-operative assessment

## 2020-09-09 NOTE — Transfer of Care (Signed)
Immediate Anesthesia Transfer of Care Note  Patient: Dustin Leiter Sr.  Procedure(s) Performed: LAPAROSCOPIC CHOLECYSTECTOMY (N/A ) REPAIR UMBILICAL HERNIA  Patient Location: PACU  Anesthesia Type:General  Level of Consciousness: awake and patient cooperative  Airway & Oxygen Therapy: Patient Spontanous Breathing and Patient connected to nasal cannula oxygen  Post-op Assessment: Report given to RN and Post -op Vital signs reviewed and stable  Post vital signs: Reviewed and stable  Last Vitals:  Vitals Value Taken Time  BP    Temp 98.4   Pulse 67 09/09/20 1131  Resp 9 09/09/20 1131  SpO2 97 % 09/09/20 1131  Vitals shown include unvalidated device data.  Last Pain:  Vitals:   09/09/20 0938  TempSrc: Oral  PainSc: 0-No pain      Patients Stated Pain Goal: 6 (97/94/80 1655)  Complications: No complications documented.

## 2020-09-09 NOTE — Progress Notes (Signed)
[facility]  Rx sent in and talked with his wife. Hopefully will be able to go home as he did well with surgery. Umbilical hernia repaired primarily. Binder ordered.  Curlene Labrum, MD [facility] [address] [place], [place] [address] [phone] (office)

## 2020-09-09 NOTE — Anesthesia Postprocedure Evaluation (Signed)
Anesthesia Post Note  Patient: Dustin Leiter Sr.  Procedure(s) Performed: LAPAROSCOPIC CHOLECYSTECTOMY (N/A ) REPAIR UMBILICAL HERNIA  Patient location during evaluation: PACU Anesthesia Type: General Level of consciousness: awake and alert and oriented Pain management: pain level controlled Vital Signs Assessment: post-procedure vital signs reviewed and stable Respiratory status: spontaneous breathing and respiratory function stable Cardiovascular status: blood pressure returned to baseline and stable Postop Assessment: no apparent nausea or vomiting Anesthetic complications: no   No complications documented.   Last Vitals:  Vitals:   09/09/20 1226 09/09/20 1245  BP: (!) 141/88 130/65  Pulse: 63 (!) 50  Resp: 16 13  Temp: 36.7 C   SpO2: 94% 100%    Last Pain:  Vitals:   09/09/20 1226  TempSrc: Oral  PainSc: 3                  Jymir Dunaj C Genowefa Morga

## 2020-09-09 NOTE — Anesthesia Preprocedure Evaluation (Addendum)
Anesthesia Evaluation  Patient identified by MRN, date of birth, ID band Patient awake    Reviewed: Allergy & Precautions, NPO status , Patient's Chart, lab work & pertinent test results  History of Anesthesia Complications Negative for: history of anesthetic complications  Airway Mallampati: III  TM Distance: >3 FB Neck ROM: Full   Comment: Radical neck dissection, radiation to neck, Possible Difficult airway, tongue rigid Dental  (+) Dental Advisory Given, Teeth Intact   Pulmonary sleep apnea and Continuous Positive Airway Pressure Ventilation ,    Pulmonary exam normal breath sounds clear to auscultation       Cardiovascular Exercise Tolerance: Good Normal cardiovascular exam Rhythm:Regular Rate:Normal     Neuro/Psych negative psych ROS   GI/Hepatic negative GI ROS, Neg liver ROS,   Endo/Other  negative endocrine ROS  Renal/GU Renal InsufficiencyRenal disease     Musculoskeletal  (+) Arthritis ,   Abdominal   Peds  Hematology negative hematology ROS (+)   Anesthesia Other Findings Right TMJ pain, risk of TMJ pain getting worse after the procedure was explained.   Reproductive/Obstetrics                            Anesthesia Physical Anesthesia Plan  ASA: III  Anesthesia Plan: General   Post-op Pain Management:    Induction: Intravenous  PONV Risk Score and Plan: 4 or greater and Ondansetron, Dexamethasone and Midazolam  Airway Management Planned: Oral ETT and Video Laryngoscope Planned  Additional Equipment:   Intra-op Plan:   Post-operative Plan: Extubation in OR  Informed Consent: I have reviewed the patients History and Physical, chart, labs and discussed the procedure including the risks, benefits and alternatives for the proposed anesthesia with the patient or authorized representative who has indicated his/her understanding and acceptance.     Dental advisory  given  Plan Discussed with: CRNA and Surgeon  Anesthesia Plan Comments: (Possible difficult airway, risk of cancelling the procedure if anesthesia can't secure his airway after induction was explained.  )        Anesthesia Quick Evaluation

## 2020-09-09 NOTE — Discharge Instructions (Signed)
PATIENT INSTRUCTIONS POST-ANESTHESIA  IMMEDIATELY FOLLOWING SURGERY:  Do not drive or operate machinery for the first twenty four hours after surgery.  Do not make any important decisions for twenty four hours after surgery or while taking narcotic pain medications or sedatives.  If you develop intractable nausea and vomiting or a severe headache please notify your doctor immediately.  FOLLOW-UP:  Please make an appointment with your surgeon as instructed. You do not need to follow up with anesthesia unless specifically instructed to do so.  WOUND CARE INSTRUCTIONS (if applicable):  Keep a dry clean dressing on the anesthesia/puncture wound site if there is drainage.  Once the wound has quit draining you may leave it open to air.  Generally you should leave the bandage intact for twenty four hours unless there is drainage.  If the epidural site drains for more than 36-48 hours please call the anesthesia department.  QUESTIONS?:  Please feel free to call your physician or the hospital operator if you have any questions, and they will be happy to assist you.      Discharge Laparoscopic Surgery Instructions and umbilical hernia repair:   Common Complaints: Right shoulder pain is common after laparoscopic surgery. This is secondary to the gas used in the surgery being trapped under the diaphragm.  Walk to help your body absorb the gas. This will improve in a few days. Pain at the port sites are common, especially the larger port sites. This will improve with time.  Some nausea is common and poor appetite. The main goal is to stay hydrated the first few days after surgery.   Diet/ Activity: Diet as tolerated. You may not have an appetite, but it is important to stay hydrated. Drink 64 ounces of water a day. Your appetite will return with time.  Shower per your regular routine daily.  Do not take hot showers. Take warm showers that are less than 10 minutes. Rest and listen to your body, but do not  remain in bed all day.  Walk everyday for at least 15-20 minutes. Deep cough and move around every 1-2 hours in the first few days after surgery.   Do not place lotions or balms on your incision unless instructed to specifically by Dr. Constance Haw. Do not lift > 10 lbs, perform excessive bending, pushing, pulling, squatting for 6-8 weeks after surgery. Where your abdominal binder with activity as much as possible. The activity restrictions and the abdominal binder are to prevent hernia formation at your incision while you are healing.  Do not pick at the dermabond glue on your incision sites.  This glue film will remain in place for 1-2 weeks and will start to peel off.    Pain Expectations and Narcotics: -After surgery you will have pain associated with your incisions and this is normal. The pain is muscular and nerve pain, and will get better with time. -You are encouraged and expected to take non narcotic medications like tylenol and ibuprofen (when able) to treat pain as multiple modalities can aid with pain treatment. -Narcotics are only used when pain is severe or there is breakthrough pain. -You are not expected to have a pain score of 0 after surgery, as we cannot prevent pain. A pain score of 3-4 that allows you to be functional, move, walk, and tolerate some activity is the goal. The pain will continue to improve over the days after surgery and is dependent on your surgery. -Due to [place] law, we are only able to give  a certain amount of pain medication to treat post operative pain, and we only give additional narcotics on a patient by patient basis.  -For most laparoscopic surgery, studies have shown that the majority of patients only need 10-15 narcotic pills, and for open surgeries most patients only need 15-20.   -Having appropriate expectations of pain and knowledge of pain management with non narcotics is important as we do not want anyone to become addicted to narcotic pain medication.   -Using ice packs in the first 48 hours and heating pads after 48 hours, wearing an abdominal binder (when recommended), and using over the counter medications are all ways to help with pain management.   -Simple acts like meditation and mindfulness practices after surgery can also help with pain control and research has proven the benefit of these practices.    Medication: Take tylenol and ibuprofen as needed for pain control, alternating every 4-6 hours.  Example:  Tylenol 1000mg  @ 6am, 12noon, 6pm, 67midnight (Do not exceed 4000mg  of tylenol a day). Ibuprofen 800mg  @ 9am, 3pm, 9pm, 3am (Do not exceed 3600mg  of ibuprofen a day).  Take Roxicodone for breakthrough pain every 4 hours.  Take Colace for constipation related to narcotic pain medication. If you do not have a bowel movement in 2 days, take Miralax over the counter.  Drink plenty of water to also prevent constipation.   Contact Information: If you have questions or concerns, please call our office, [phone], Monday- Thursday 8AM-5PM and Friday 8AM-12Noon.  If it is after hours or on the weekend, please call [facility], [phone], [phone], and ask to speak to the surgeon on call for Dr. Constance Haw at [facility].    Minimally Invasive Cholecystectomy, Care After This sheet gives you information about how to care for yourself after your procedure. Your doctor may also give you more specific instructions. If you have problems or questions, contact your doctor. What can I expect after the procedure? After the procedure, it is common:  To have pain at the areas of surgery. You will be given medicines for pain.  To vomit or feel like you may vomit.  To feel fullness in the belly (bloating) or to have pain in the shoulder. This comes from the gas that was used during the surgery. Follow these instructions at home: Medicines  Take over-the-counter and prescription medicines only as told by your doctor.  If you were  prescribed an antibiotic medicine, take it as told by your doctor. Do not stop using the antibiotic even if you start to feel better.  Ask your doctor if the medicine prescribed to you: ? Requires you to avoid driving or using machinery. ? Can cause trouble pooping (constipation). You may need to take these actions to prevent or treat trouble pooping:  Drink enough fluid to keep your pee (urine) pale yellow.  Take over-the-counter or prescription medicines.  Eat foods that are high in fiber. These include beans, whole grains, and fresh fruits and vegetables.  Limit foods that are high in fat and sugar. These include fried or sweet foods. Incision care  Do not take baths, swim, or use a hot tub until your doctor approves. You may shower.  Check your surgery area every day for signs of infection. Check for: ? More redness, swelling, or pain. ? Fluid or blood. ? Warmth. ? Pus or a bad smell.   Activity  Rest as told by your doctor.  Do not sit for a long time without moving.  Get up to take short walks every 1-2 hours. This is important. Ask for help if you feel weak or unsteady.  Do not lift anything that is heavier than 10 lb (4.5 kg), or the limit that you are told, until your doctor says that it is safe.  Do not play contact sports until your doctor says it is okay.  Do not return to work or school until your doctor says it is okay.  Return to your normal activities as told by your doctor. Ask your doctor what activities are safe for you. General instructions  If you were given a medicine to help you relax (sedative) during your procedure, it can affect you for many hours. Do not drive or use machinery until your doctor says that it is safe.  Keep all follow-up visits as told by your doctor. This is important. Contact a doctor if:  You get a rash.  You have more redness, swelling, or pain around your cuts from surgery.  You have fluid or blood coming from your cuts from  surgery.  Your cuts from surgery feel warm to the touch.  You have pus or a bad smell coming from your cuts from surgery.  You have a fever.  One or more of your cuts from surgery breaks open. Get help right away if:  You have trouble breathing.  You have chest pain.  You have pain that is getting worse in your shoulders.  You faint or feel dizzy when you stand.  You have very bad pain in your belly (abdomen).  You feel like you may vomit or you vomit, and this lasts for more than one day.  You have leg pain. Summary  After your surgery, it is common to have pain at the areas of surgery. You may also have vomiting or fullness in the belly.  Follow your doctor's instructions about medicine, activity restrictions, and caring for your surgery areas. Do not do activities that require a lot of effort.  Contact a doctor if you have a fever or other signs of infection, such as more redness, swelling, or pain around the cuts from surgery.  Get help right away if you have chest pain, increasing pain in the shoulders, or trouble breathing. This information is not intended to replace advice given to you by your health care provider. Make sure you discuss any questions you have with your health care provider. Document Revised: 04/11/2019 Document Reviewed: 04/11/2019 Elsevier Patient Education  2021 [employer].

## 2020-09-09 NOTE — Op Note (Signed)
Operative Note   Preoperative Diagnosis: Symptomatic cholelithiasis, umbilical hernia    Postoperative Diagnosis: Same   Procedure(s) Performed: Laparoscopic cholecystectomy, primary umbilical hernia repair    Surgeon: Lanell Matar. Constance Haw, MD   Assistants: Aviva Signs, MD    Anesthesia: General endotracheal   Anesthesiologist: Denese Killings, MD    Specimens: Gallbladder    Estimated Blood Loss: Minimal    Blood Replacement: None    Complications: None    Operative Findings:  Distended gallbladder    Procedure: The patient was taken to the operating room and placed supine. General endotracheal anesthesia was induced. Intravenous antibiotics were administered per protocol. An orogastric tube positioned to decompress the stomach. The abdomen was prepared and draped in the usual sterile fashion.    A supraumbilical incision and the hernia defect was noted. The hernia sac was entered with a Veress to achieve pneumoperitoneum to 15 mmHg with carbon dioxide. A 11 mm optiview port was placed through the umbilical hernia, and a 10 mm 0-degree operative laparoscope was introduced. The area underlying the trocar and Veress needle were inspected and without evidence of injury.  Remaining trocars were placed under direct vision. Two 5 mm ports were placed in the right abdomen, between the anterior axillary and midclavicular line.  A final 11 mm port was placed through the mid-epigastrium, near the falciform ligament.    The gallbladder fundus was elevated cephalad and the infundibulum was retracted to the patient's right. The gallbladder/cystic duct junction was skeletonized. The cystic artery noted in the triangle of Calot and was also skeletonized.  We then continued liberal medial and lateral dissection until the critical view of safety was achieved.    There was a small overlying the cystic duct that was clipped. The cystic duct and cystic artery were triply  clipped and divided. The  gallbladder was then dissected from the liver bed with electrocautery. The specimen was placed in an Endopouch and was retrieved through the epigastric site.   Final inspection revealed acceptable hemostasis. Surgical SNOW was placed in the gallbladder bed.  Trocars were removed and pneumoperitoneum was released.  The umbilical hernia sac was dissected off and the umbilical hernia was repaired primary using vest over pants technique with 0 Ethibond suture. The umbilical stalk was still attached and did not have to be re-approximated.   The epigastric site was overlying the rib. Skin incisions were closed with 4-0 Monocryl subcuticular sutures and Dermabond. The patient was awakened from anesthesia and extubated without complication.    Curlene Labrum, MD [facility] [address] [place], [place] [address] [phone] (office)

## 2020-09-09 NOTE — Progress Notes (Signed)
Dr Battula at bedside to check pt. Cleared for d/c home. 

## 2020-09-09 NOTE — Interval H&P Note (Signed)
History and Physical Interval Note:  09/09/2020 9:52 AM  Dustin Leiter Sr.  has presented today for surgery, with the diagnosis of Cholelithiasis.  The various methods of treatment have been discussed with the patient and family. After consideration of risks, benefits and other options for treatment, the patient has consented to  Procedure(s): LAPAROSCOPIC CHOLECYSTECTOMY POSSIBLE OPEN (N/A) as a surgical intervention.  The patient's history has been reviewed, patient examined, no change in status, stable for surgery.  I have reviewed the patient's chart and labs.  Questions were answered to the patient's satisfaction.    Doing well. Dr. Charna Elizabeth discussed with patient intubation and plans given the radiation history.   Dustin Rios

## 2020-09-10 ENCOUNTER — Encounter (HOSPITAL_COMMUNITY): Payer: Self-pay | Admitting: General Surgery

## 2020-09-10 NOTE — Progress Notes (Signed)
[place]       [phone] Fax:([phone] [facility]  Oral and Oropharyngeal Cancer     CLINIC:    Survivorship      REASON FOR VISIT:    Routine follow-up for history of oral and oropharyngeal Cancer( Malignant neoplasm of tonsillar fossa  )      BRIEF ONCOLOGIC HISTORY:    Oncology History  Squamous cell carcinoma of head and neck (HCC)  07/13/2019 Imaging   CT neck: IMPRESSION: 4.1 x 3.2 cm necrotic/cystic node or nodal conglomerate at the right level II station likely reflecting nodal metastatic disease. No definite primary mass is identified within the oral cavity, pharynx or larynx. Correlate with direct visualization and consider direct tissue sampling. Additionally, PET-CT may be helpful.   Cervical spondylosis as described. A prominent C6-C7 posterior disc osteophyte contributes to at least moderate bony spinal canal stenosis. Multilevel neural foraminal narrowing.   07/26/2019 Pathology Results   FINAL MICROSCOPIC DIAGNOSIS:   A. RIGHT CERVICAL LYMPH NODE, NEEDLE CORE BIOPSY:  - Squamous cell carcinoma, basaloid.  - No distinct nodal tissue identified.   P16 immunohistochemistry is POSITIVE.    08/10/2019 Initial Diagnosis   Squamous cell carcinoma of head and neck ([place])   08/10/2019 Imaging   PET:  IMPRESSION: 1. The 2.8 cm right level IIa lymph node has a maximum SUV of 28.4. A primary lesion is not readily seen. There is some asymmetric palatine tonsillar activity with the left tonsil having maximum SUV of 12.4 and the right 7.8, but no tonsillar mass is observed on the CT data. 2. No compelling findings of active malignancy in the chest, abdomen/pelvis, or skeleton. There is some accentuated anal activity which is likely physiologic given the lack of visible abnormality on the CT data. 3. Other imaging findings of  potential clinical significance: Diffuse hepatic steatosis. Hypodense left renal lesion is probably a cyst but technically too small to characterize.   08/11/2019 Cancer Staging   Staging form: Pharynx - HPV-Mediated Oropharynx, AJCC 8th Edition - Clinical stage from 08/11/2019: Stage I (cT0, cN1, cM0, p16+) - Signed by Eppie Gibson, MD on 08/12/2019   Squamous cell carcinoma of right tonsil ([place]) (Resolved)  09/04/2019 Initial Diagnosis   Squamous cell carcinoma of right tonsil ([place])   10/10/2019 Cancer Staging   Staging form: Pharynx - HPV-Mediated Oropharynx, AJCC 8th Edition - Pathologic stage from 10/10/2019: Stage I (pT1, pN1, cM0, p16+) - Signed by Eppie Gibson, MD on 10/11/2019   Malignant neoplasm of tonsillar fossa ([place])  09/11/2019 Initial Diagnosis   Malignant neoplasm of tonsillar fossa ([place])   10/10/2019 Cancer Staging   Staging form: Pharynx - HPV-Mediated Oropharynx, AJCC 8th Edition - Pathologic stage from 10/10/2019: Stage I (pT1, pN1, cM0, p16+) - Signed by Eppie Gibson, MD on 10/11/2019   10/20/2019 -  Chemotherapy   The patient had dexamethasone (DECADRON) 4 MG tablet, 1 of 1 cycle, Start date: --, End date: -- palonosetron (ALOXI) injection 0.25 mg, 0.25 mg, Intravenous,  Once, 5 of 7 cycles Administration: 0.25 mg (10/20/2019), 0.25 mg (10/26/2019), 0.25 mg (11/02/2019), 0.25 mg (11/10/2019) CISplatin (PLATINOL) 100 mg in sodium chloride 0.9 % 500 mL chemo infusion, 39.3 mg/m2 = 102 mg, Intravenous,  Once, 5 of 7 cycles Administration: 100 mg (10/20/2019), 100 mg (10/26/2019), 100 mg (11/02/2019), 100 mg (11/10/2019) fosaprepitant (EMEND) 150  mg in sodium chloride 0.9 % 145 mL IVPB, 150 mg, Intravenous,  Once, 5 of 7 cycles Administration: 150 mg (10/20/2019), 150 mg (10/26/2019), 150 mg (11/02/2019), 150 mg (11/10/2019)  for chemotherapy treatment.          INTERVAL HISTORY:    Dustin Bryers Sr. is a 55 y.o. male with a diagnosis of a Stage I (pT1, pN1, cM0, p16+)  Squamous cell  carcinoma of right tonsil . He presents to clinic today for routine follow up having last been seen by Dr. Irene Limbo on 05/28/2020 . He presents to clinic today for a survivorship of appointment.  He reports that he is dry mouth with thick saliva in his throat.  He is increasing his intake of water.  He had his PEG tube removed 2 months ago.  He reports throat irritation when he drinks or eats things that are cold.  He notes he cannot open his mouth as wide as he did before his surgery.  He has limited range of motion of his right forearm and reports that his right lower lip droops.  He was last seen by Dr. Sullivan Lone on 05/28/2020.  He will see him again on 12/03/2020.  Will see Dr. Isidore Moos on 03/07/2021.  He saw Dr. Constance Holster 3 months ago and sees him every 3 months.  He sees his dentist in [place] every 4 months.  He also sees a primary care provider but is only seen him once before.     REVIEW OF SYSTEMS:   Visual changes:    no   Swelling of the face:    no   Swelling of the mouth:    no   Swelling of the throat:    no   Dental problems:    no   Skin changes at treatment site:  no   Dry mouth:     yes   Difficulty talking:    no  Thickened saliva:    yes   Increased mucus:    no  Difficulty opening mouth:   yes  Fatigue:     no   Pain or difficulty swallowing or chewing: no    Loss of appetite:    no   Numbness of the ear:    no  Weakness raising the arm(s):  yes  Lack of lip movement:    yes  Facial disfigurement:    no  Numbness of face or neck:   no  Loss of voice or impaired speech:  no  Hearing difficulty:    no  Lymphedema:     no  Pain:      no      -Weight: The patient is weight is stable at 252 pounds 12.8 ounces.     -Last ENT visit:    Last seen by Dr. Constance Holster in January 2022  -Last Rad Onc visit:   Last seen by Dr. Isidore Moos on 03/05/2020  -Last Dentist visit:   Sees his dentist every 4 months     CURRENT MEDICATIONS:    Current  Outpatient Medications on File Prior to Visit  Medication Sig Dispense Refill  . Colchicine 0.6 MG CAPS Take by mouth as needed.    Marland Kitchen ibuprofen (ADVIL) 200 MG tablet Take 200 mg by mouth every 6 (six) hours as needed for fever, headache or mild pain.    Marland Kitchen loratadine (CLARITIN) 10 MG tablet Take 10 mg by mouth daily.    . ondansetron (ZOFRAN) 4 MG tablet Take 1  tablet (4 mg total) by mouth every 8 (eight) hours as needed. 30 tablet 1  . oxyCODONE (ROXICODONE) 5 MG immediate release tablet Take 1 tablet (5 mg total) by mouth every 4 (four) hours as needed for severe pain or breakthrough pain. 20 tablet 0   Current Facility-Administered Medications on File Prior to Visit  Medication Dose Route Frequency Provider Last Rate Last Admin  . 0.9 %  sodium chloride infusion   Intravenous Continuous Monia Sabal, PA-C            ALLERGIES:    No Known Allergies      ADDITIONAL REVIEW OF SYSTEMS:    Review of Systems  Constitutional: Negative for chills, diaphoresis, malaise/fatigue and weight loss.  HENT: Negative for congestion, hearing loss, sore throat and tinnitus.   Eyes: Negative.   Respiratory: Negative for cough, shortness of breath and wheezing.   Cardiovascular: Negative for chest pain, palpitations and leg swelling.  Gastrointestinal: Negative for abdominal pain, blood in stool, constipation, diarrhea, melena, nausea and vomiting.  Genitourinary: Negative for dysuria.  Musculoskeletal: Negative for back pain, joint pain and neck pain.  Skin: Negative.   Neurological: Positive for sensory change (Changes in the taste of food). Negative for dizziness, speech change, weakness and headaches.  Psychiatric/Behavioral: Negative.         PHYSICAL EXAM:    Vitals:   08/27/20 1317  BP: 120/83  Pulse: 79  Resp: 16  Temp: 97.8 F (36.6 C)  SpO2: 98%    Filed Weights   08/27/20 1317  Weight: 252 lb 12.8 oz (114.7 kg)    Weight Date  252 pounds 12.8 ounces  08/27/2020   252 pounds  08/20/2020  250 pounds 12.8 ounces  05/28/2020            Pre-treatment (RT consult date):     General: Dustin Popoff Sr. is a 55 y.o. male who appears to be in no acute distress.? Dustin Holquin Sr. is accompanied/unaccompanied today.    HEENT:    Head: atraumatic and normocephalic.?    Pupils: equal and reactive to light.    Conjunctivae: clear without exudate.?    Sclerae: anicteric.    Oral mucosa: pink and moist without lesions.?    Tongue: pink, moist, and midline.     Oropharynx: pink and moist, without lesions.   Lymph: No preauricular, postauricular, cervical, supraclavicular, or infraclavicular lymphadenopathy noted on palpation. ?   Neck: No palpable masses. Skin on neck is supple with no masses or nodules.  There is a well-healed surgical incision in the anterior cervical neck.    Cardiovascular: Regular rate and rhythm without murmurs, rubs, or gallops.Marland Kitchen   Respiratory: Clear to auscultation bilaterally without wheezes, rales, or rhonchi. Chest expansion symmetric without accessory muscle use; breathing non-labored.?   GI: Abdomen soft and round. Bowel sounds normoactive. No hepatosplenomegaly, masses,  tenderness, rebound, guarding, or distention.   GU: Deferred. ?   Neuro: No focal deficits. Steady gait. ?   Psych: Normal mood and affect for situation.   Extremities: No edema.? Decreased range of motion of the right arm.  Skin: Warm and dry. No rashes or lesions.      LABORATORY DATA:    The patient's complete chemistry panel returned showing:   Lab Results  Component Value Date   NA 135 08/08/2020   NA 140 04/03/2019   K 3.6 08/08/2020   CL 100 08/08/2020   CO2 25 08/08/2020   GLUCOSE 139 (H) 08/08/2020   BUN  17 08/08/2020   BUN 15 04/03/2019   CREATININE 1.12 08/08/2020   CREATININE 1.25 (H) 06/20/2020   ALBUMIN 4.1 08/08/2020   ALBUMIN 4.3 04/03/2019   ALKPHOS 65 08/08/2020   ALT 21 08/08/2020   ALT 15 06/20/2020    AST 25 08/08/2020   AST 17 06/20/2020   BILITOT 0.6 08/08/2020   BILITOT 0.4 06/20/2020        The patient's TSH returned at:  Lab Results  Component Value Date   TSH 3.507 06/20/2020   TSH 2.930 04/03/2019        The patient's complete blood count returned showing:      Component Value Date/Time   WBC 11.8 (H) 08/08/2020 0200   RBC 5.20 08/08/2020 0200   HGB 15.2 08/08/2020 0200   HGB 13.6 11/03/2019 1120   HGB 15.5 04/03/2019 1138   HCT 45.9 08/08/2020 0200   HCT 45.4 04/03/2019 1138   PLT 194 08/08/2020 0200   PLT 183 11/03/2019 1120   PLT 253 04/03/2019 1138   MCV 88.3 08/08/2020 0200   MCV 90 04/03/2019 1138   MCH 29.2 08/08/2020 0200   MCHC 33.1 08/08/2020 0200   RDW 13.2 08/08/2020 0200   RDW 13.1 04/03/2019 1138   LYMPHSABS 1.0 06/20/2020 1342   MONOABS 0.4 06/20/2020 1342   EOSABS 0.2 06/20/2020 1342   BASOSABS 0.0 06/20/2020 1342        DIAGNOSTIC IMAGING:    No results found.       ASSESSMENT & PLAN:    Dustin Rios Sr. is a 55 y.o. male with a diagnosis of a Stage I (pT1, pN1, cM0, p16+)  Squamous cell carcinoma of right tonsil  which was originally diagnosed on 07/26/2019. He was treated with chemoradiation which he completed treatment on 11/30/2019.  He presents to the survivorship clinic today for routine follow-up having last been seen by Dr. Sullivan Lone on 05/28/2020.      1. Oral and oropharyngeal cancer ( Squamous cell carcinoma of right tonsil  ):  Dustin Rios is clinically without evidence of residual or recurrence cancer on physical exam today.         2. Nutritional status: Dustin Rios reports that he is currently able to consume adequate nutrition by mouth.  His weight is stable at 252 pounds 12.8 ounces lbs today.  He was encouraged to continue to consume adequate hydration and nutrition, as tolerated.        3. At risk for dysphagia: Given Dustin Rios treatment for oral and oropharyngeal cancer ( Squamous cell carcinoma of  right tonsil  ), which included radiation therapy, he maybe at risk for chronic dysphagia.  He reports having mild difficulty with certain foods. He is able to consume most foods and liquids without difficulty.     4.  At risk for neck lymphedema:  When patients with head & neck cancers are treated with surgery and/or radiation therapy, there is an associated increased risk of neck lymphedema.  Dustin Rios reports that currently he s not experiencing symptoms.     5.  At risk for hypothyroidism: The thyroid gland is often affected after treatment for head & neck cancer.  Dustin Rios most recent TSH was  Lab Results  Component Value Date   TSH 3.507 06/20/2020   TSH 2.930 04/03/2019   on (date).   We discussed that he will continue to have serial TSH monitoring for at least the next 5 years as part of his routine follow-up and  post-cancer treatment care.          6. At risk for tooth decay/dental concerns: After treatment with radiation for head & neck cancers, patients often experience dry mouth which increases their risk of dental caries. Dustin Rios continues to see his dentist every 4 months.      DISPOSITION:    -See Dr. Constance Holster (ENT) in July 2022  -Return to cancer center to see Dr. Irene Limbo on 11/27/2020.    -Return to cancer center to see Survivorship APP in 1 year.       Sandi Mealy, MHS, PA-C Physician Assistant  09/10/20   12:19 PM        NOTE: PRIMARY CARE PROVIDER   Claretta Fraise, MD   Phone: [phone]   Fax: [phone]

## 2020-09-11 ENCOUNTER — Ambulatory Visit: Payer: 59 | Admitting: Urology

## 2020-09-11 LAB — SURGICAL PATHOLOGY

## 2020-09-24 ENCOUNTER — Other Ambulatory Visit: Payer: Self-pay

## 2020-09-24 ENCOUNTER — Encounter: Payer: Self-pay | Admitting: General Surgery

## 2020-09-24 ENCOUNTER — Telehealth (INDEPENDENT_AMBULATORY_CARE_PROVIDER_SITE_OTHER): Payer: 59 | Admitting: General Surgery

## 2020-09-24 DIAGNOSIS — K8020 Calculus of gallbladder without cholecystitis without obstruction: Secondary | ICD-10-CM

## 2020-09-24 NOTE — Progress Notes (Signed)
[facility]  I am calling the patient for post operative evaluation. This is not a billable encounter as it is under the [place] charges for the surgery.  The patient had a laparoscopic cholecystectomy and primary umbilical hernia. The patient reports that he is doing well. The are tolerating a diet, having good pain control, and having regular Bms.  The incisions are healing. The patient has no concerns.   He had a primary umbilical hernia repair and knows to not lift greater than 10 lbs for 6 weeks after surgery.   Pathology: FINAL MICROSCOPIC DIAGNOSIS:   A. GALLBLADDER, CHOLECYSTECTOMY:  - Chronic cholecystitis with cholelithiasis   Will see the patient PRN.   Curlene Labrum, MD [facility] [address] [place],  [id number] [phone] (office)

## 2020-10-16 ENCOUNTER — Other Ambulatory Visit: Payer: Self-pay

## 2020-10-16 ENCOUNTER — Ambulatory Visit (HOSPITAL_COMMUNITY)
Admission: RE | Admit: 2020-10-16 | Discharge: 2020-10-16 | Disposition: A | Discharge status: Home / Self Care | Payer: 59 | Source: Ambulatory Visit | Attending: Urology | Admitting: Urology

## 2020-10-16 DIAGNOSIS — N200 Calculus of kidney: Secondary | ICD-10-CM | POA: Insufficient documentation

## 2020-10-23 ENCOUNTER — Ambulatory Visit (HOSPITAL_COMMUNITY): Payer: 59

## 2020-10-30 ENCOUNTER — Ambulatory Visit (INDEPENDENT_AMBULATORY_CARE_PROVIDER_SITE_OTHER): Payer: 59 | Admitting: Urology

## 2020-10-30 ENCOUNTER — Encounter: Payer: Self-pay | Admitting: Urology

## 2020-10-30 ENCOUNTER — Other Ambulatory Visit: Payer: Self-pay

## 2020-10-30 VITALS — BP 122/78 | HR 72 | Ht 68.0 in | Wt 250.4 lb

## 2020-10-30 DIAGNOSIS — N200 Calculus of kidney: Secondary | ICD-10-CM | POA: Diagnosis not present

## 2020-10-30 LAB — URINALYSIS, ROUTINE W REFLEX MICROSCOPIC
Bilirubin, UA: NEGATIVE
Glucose, UA: NEGATIVE
Ketones, UA: NEGATIVE
Leukocytes,UA: NEGATIVE
Nitrite, UA: NEGATIVE
Protein,UA: NEGATIVE
RBC, UA: NEGATIVE
Specific Gravity, UA: >1.03 — ABNORMAL HIGH (ref 1.005–1.030)
Urobilinogen, Ur: 0.2 mg/dL (ref 0.2–1.0)
pH, UA: 5.5 (ref 5.0–7.5)

## 2020-10-30 NOTE — Patient Instructions (Signed)
Textbook of Natural Medicine (5th ed., pp. 1518-1527.e3). [place], [place]: Elsevier.">  Dietary Guidelines to Help Prevent Kidney Stones Kidney stones are deposits of minerals and salts that form inside your kidneys. Your risk of developing kidney stones may be greater depending on your diet, your lifestyle, the medicines you take, and whether you have certain medical conditions. Most people can lower their chances of developing kidney stones by following the instructions below. Your dietitian may give you more specific instructions depending on your overall health and the type of kidney stones youtend to develop. What are tips for following this plan? Reading food labels  Choose foods with "no salt added" or "low-salt" labels. Limit your salt (sodium) intake to less than 1,500 mg a day. Choose foods with calcium for each meal and snack. Try to eat about 300 mg of calcium at each meal. Foods that contain 200-500 mg of calcium a serving include: 8 oz (237 mL) of milk, calcium-fortifiednon-dairy milk, and calcium-fortifiedfruit juice. Calcium-fortified means that calcium has been added to these drinks. 8 oz (237 mL) of kefir, yogurt, and soy yogurt. 4 oz (114 g) of tofu. 1 oz (28 g) of cheese. 1 cup (150 g) of dried figs. 1 cup (91 g) of cooked broccoli. One 3 oz (85 g) can of sardines or mackerel. Most people need 1,000-1,500 mg of calcium a day. Talk to your dietitian abouthow much calcium is recommended for you. Shopping Buy plenty of fresh fruits and vegetables. Most people do not need to avoid fruits and vegetables, even if these foods contain nutrients that may contribute to kidney stones. When shopping for convenience foods, choose: Whole pieces of fruit. Pre-made salads with dressing on the side. Low-fat fruit and yogurt smoothies. Avoid buying frozen meals or prepared deli foods. These can be high in sodium. Look for foods with live cultures, such as yogurt and kefir. Choose high-fiber  grains, such as whole-wheat breads, oat bran, and wheat cereals. Cooking Do not add salt to food when cooking. Place a salt shaker on the table and allow each person to add his or her own salt to taste. Use vegetable protein, such as beans, textured vegetable protein (TVP), or tofu, instead of meat in pasta, casseroles, and soups. Meal planning Eat less salt, if told by your dietitian. To do this: Avoid eating processed or pre-made food. Avoid eating fast food. Eat less animal protein, including cheese, meat, poultry, or fish, if told by your dietitian. To do this: Limit the number of times you have meat, poultry, fish, or cheese each week. Eat a diet free of meat at least 2 days a week. Eat only one serving each day of meat, poultry, fish, or seafood. When you prepare animal protein, cut pieces into small portion sizes. For most meat and fish, one serving is about the size of the palm of your hand. Eat at least five servings of fresh fruits and vegetables each day. To do this: Keep fruits and vegetables on hand for snacks. Eat one piece of fruit or a handful of berries with breakfast. Have a salad and fruit at lunch. Have two kinds of vegetables at dinner. Limit foods that are high in a substance called oxalate. These include: Spinach (cooked), rhubarb, beets, sweet potatoes, and Swiss chard. Peanuts. Potato chips, french fries, and baked potatoes with skin on. Nuts and nut products. Chocolate. If you regularly take a diuretic medicine, make sure to eat at least 1 or 2 servings of fruits or vegetables that are   high in potassium each day. These include: Avocado. Banana. Orange, prune, carrot, or tomato juice. Baked potato. Cabbage. Beans and split peas. Lifestyle  Drink enough fluid to keep your urine pale yellow. This is the most important thing you can do. Spread your fluid intake throughout the day. If you drink alcohol: Limit how much you use to: 0-1 drink a day for women who  are not pregnant. 0-2 drinks a day for men. Be aware of how much alcohol is in your drink. In the [place] one drink equals one 12 oz bottle of beer (355 mL), one 5 oz glass of wine (148 mL), or one 1 oz glass of hard liquor (44 mL). Lose weight if told by your health care provider. Work with your dietitian to find an eating plan and weight loss strategies that work best for you.  General information Talk to your health care provider and dietitian about taking daily supplements. You may be told the following depending on your health and the cause of your kidney stones: Not to take supplements with vitamin C. To take a calcium supplement. To take a daily probiotic supplement. To take other supplements such as magnesium, fish oil, or vitamin B6. Take over-the-counter and prescription medicines only as told by your health care provider. These include supplements. What foods should I limit? Limit your intake of the following foods, or eat them as told by your dietitian. Vegetables Spinach. Rhubarb. Beets. Canned vegetables. Pickles. Olives. Baked potatoeswith skin. Grains Wheat bran. Baked goods. Salted crackers. Cereals high in sugar. Meats and other proteins Nuts. Nut butters. Large portions of meat, poultry, or fish. Salted, precooked,or cured meats, such as sausages, meat loaves, and hot dogs. Dairy Cheese. Beverages Regular soft drinks. Regular vegetable juice. Seasonings and condiments Seasoning blends with salt. Salad dressings. Soy sauce. Ketchup. Barbecue sauce. Other foods Canned soups. Canned pasta sauce. Casseroles. Pizza. Lasagna. Frozen meals.Potato chips. French fries. The items listed above may not be a complete list of foods and beverages you should limit. Contact a dietitian for more information. What foods should I avoid? Talk to your dietitian about specific foods you should avoid based on the typeof kidney stones you have and your overall health. Fruits Grapefruit. The  item listed above may not be a complete list of foods and beverages you should avoid. Contact a dietitian for more information. Summary Kidney stones are deposits of minerals and salts that form inside your kidneys. You can lower your risk of kidney stones by making changes to your diet. The most important thing you can do is drink enough fluid. Drink enough fluid to keep your urine pale yellow. Talk to your dietitian about how much calcium you should have each day, and eat less salt and animal protein as told by your dietitian. This information is not intended to replace advice given to you by your health care provider. Make sure you discuss any questions you have with your healthcare provider. Document Revised: 04/20/2019 Document Reviewed: 04/20/2019 Elsevier Patient Education  2022 [employer]vier [employer].  

## 2020-10-30 NOTE — Progress Notes (Signed)
10/30/2020 4:30 PM   Dustin Rios. December 10, 1965 [id number]  Referring provider: Claretta Fraise, MD [place],  [place] [id number]  Followup nephrolithiasis   HPI: Dustin Rios is a 55yo here for followup for nephrolithiasis. No stone events since last visit. No flank pain. Renal US shows questionable rigth upper pole calculus.    PMH: Past Medical History:  Diagnosis Date   Arthritis    Change in stool 02/22/2017   Family history of cancer    Family history of uterine cancer    Gout 02/22/2017   Hyperlipidemia    Sleep apnea    Uses CPAP   Tonsil cancer ([facility]) DX'd 07/2019   metastatic    Surgical History: Past Surgical History:  Procedure Laterality Date   CARPAL TUNNEL RELEASE Right 04/10/2019   CHOLECYSTECTOMY N/A 09/09/2020   Procedure: LAPAROSCOPIC CHOLECYSTECTOMY;  Surgeon: [name], MD;  Location: [facility];  Service: General;  Laterality: N/A;   DIRECT LARYNGOSCOPY N/A 08/21/2019   Procedure: DIRECT LARYNGOSCOPY - WITH BX;  Surgeon: [name], MD;  Location: [facility];  Service: ENT;  Laterality: N/A;   IR GASTROSTOMY TUBE MOD SED  10/13/2019   IR GASTROSTOMY TUBE REMOVAL  06/10/2020   IR IMAGING GUIDED PORT INSERTION  10/13/2019   IR REMOVAL TUN ACCESS W/ PORT W/O FL MOD SED  06/10/2020   NECK DISSECTION  09/11/2019   RADICAL NECK DISSECTION Right 09/11/2019   Procedure: RADICAL NECK DISSECTION;  Surgeon: [name], MD;  Location: [facility];  Service: ENT;  Laterality: Right;   removal of right jugular vein in neck     TONSILLECTOMY Bilateral 08/21/2019   Procedure: TONSILLECTOMY;  Surgeon: [name], MD;  Location: [facility];  Service: ENT;  Laterality: Bilateral;   UMBILICAL HERNIA REPAIR  09/09/2020   Procedure: REPAIR UMBILICAL HERNIA;  Surgeon: [name], MD;  Location: [facility];  Service: General;;   WISDOM TOOTH EXTRACTION      Home Medications:  Allergies as of 10/30/2020   No Known Allergies       Medication List        Accurate as of October 30, 2020  4:30 PM. If you have any questions, ask your nurse or doctor.          Colchicine 0.6 MG Caps Take by mouth as needed.   ibuprofen 200 MG tablet Commonly known as: ADVIL Take 200 mg by mouth every 6 (six) hours as needed for fever, headache or mild pain.   loratadine 10 MG tablet Commonly known as: CLARITIN Take 10 mg by mouth daily.   ondansetron 4 MG tablet Commonly known as: Zofran Take 1 tablet (4 mg total) by mouth every 8 (eight) hours as needed.   oxyCODONE 5 MG immediate release tablet Commonly known as: Roxicodone Take 1 tablet (5 mg total) by mouth every 4 (four) hours as needed for severe pain or breakthrough pain.        Allergies: No Known Allergies  Family History: Family History  Problem Relation Age of Onset   COPD Mother    Uterine cancer Mother 100   Lung cancer Brother 75       smoker   Lung cancer Maternal Grandfather    Stomach cancer Paternal Grandmother        d. 49s   Cervical cancer Maternal Aunt    Lung cancer Maternal Grandmother        smoker   Prostate cancer Paternal  Grandfather    Pancreatic cancer Cousin 40       mother's maternal first cousin   Stomach cancer Cousin 85       mother's maternal first cousin   Cancer Cousin        mother's maternal first cousin with NOS cancer   Cancer Other        MGMs sister with cancer NOS   Lung cancer Other        smoker   Cancer Other        mother's maternal grandfather with NOS cancer    Social History:  reports that he has never smoked. He has never used smokeless tobacco. He reports that he does not drink alcohol and does not use drugs.  ROS: All other review of systems were reviewed and are negative except what is noted above in HPI  Physical Exam: BP 122/78   Pulse 72   Ht 5\' 8"  (1.727 m)   Wt 250 lb 6 oz (113.6 kg)   BMI 38.07 kg/m   Constitutional:  Alert and oriented, No acute distress. HEENT: Dustin Rios, moist  mucus membranes.  Trachea midline, no masses. Cardiovascular: No clubbing, cyanosis, or edema. Respiratory: Normal respiratory effort, no increased work of breathing. GI: Abdomen is soft, nontender, nondistended, no abdominal masses GU: No CVA tenderness.  Lymph: No cervical or inguinal lymphadenopathy. Skin: No rashes, bruises or suspicious lesions. Neurologic: Grossly intact, no focal deficits, moving all 4 extremities. Psychiatric: Normal mood and affect.  Laboratory Data: Lab Results  Component Value Date   WBC 11.8 (H) 08/08/2020   HGB 15.2 08/08/2020   HCT 45.9 08/08/2020   MCV 88.3 08/08/2020   PLT 194 08/08/2020    Lab Results  Component Value Date   CREATININE 1.12 08/08/2020    No results found for: PSA  No results found for: TESTOSTERONE  Lab Results  Component Value Date   HGBA1C 5.7 04/03/2019    Urinalysis    Component Value Date/Time   COLORURINE YELLOW 08/08/2020 0133   APPEARANCEUR Clear 10/30/2020 1517   LABSPEC 1.019 08/08/2020 0133   PHURINE 6.0 08/08/2020 0133   GLUCOSEU Negative 10/30/2020 1517   HGBUR NEGATIVE 08/08/2020 0133   BILIRUBINUR Negative 10/30/2020 1517   KETONESUR NEGATIVE 08/08/2020 0133   PROTEINUR Negative 10/30/2020 1517   PROTEINUR NEGATIVE 08/08/2020 0133   NITRITE Negative 10/30/2020 1517   NITRITE NEGATIVE 08/08/2020 0133   LEUKOCYTESUR Negative 10/30/2020 1517   LEUKOCYTESUR NEGATIVE 08/08/2020 0133    Lab Results  Component Value Date   LABMICR Comment 10/30/2020   BACTERIA RARE (A) 11/03/2019    Pertinent Imaging: Renal US 10/16/2020: Images reviewed and discussed with the patient No results found for this or any previous visit.  No results found for this or any previous visit.  No results found for this or any previous visit.  No results found for this or any previous visit.  Results for orders placed during the hospital encounter of 10/16/20  Ultrasound renal complete  Narrative CLINICAL DATA:   Nephrolithiasis follow-up  EXAM: RENAL / URINARY TRACT ULTRASOUND COMPLETE  COMPARISON:  CT the abdomen pelvis September 2021.  FINDINGS: Right Kidney:  Renal measurements: 9.9 x 5.9 x 5.7 cm = volume: 1721 mL. There is a shadowing 13 5 mm stone in the upper pole of the right kidney. Other small echogenic foci could represent tiny stones.  Left Kidney:  Renal measurements: 11.3 x 6.2 x 5.5 cm = volume: 200.5 mL. Multiple small echogenic  foci identified in the left kidney.  Bladder:  Appears normal for degree of bladder distention.  Other:  None.  IMPRESSION: There appears to be a 13.5 mm shadowing stone in the upper right kidney. Other small stones scattered throughout both kidneys suspected.   Electronically Signed By: Dorise Bullion III M.D On: 10/18/2020 21:21  No results found for this or any previous visit.  No results found for this or any previous visit.  No results found for this or any previous visit.   Assessment & Plan:    1. Nephrolithiasis -RTC 6 months with KUB - Urinalysis, Routine w reflex microscopic   No follow-ups on file.  Nicolette Bang, MD  [facility]

## 2020-10-30 NOTE — Progress Notes (Signed)
Urological Symptom Review  Patient is experiencing the following symptoms:   Kidney Stones  Review of Systems  Gastrointestinal (upper)  : Negative for upper GI symptoms  Gastrointestinal (lower) : Negative for lower GI symptoms  Constitutional : Fatigue  Skin: Negative for skin symptoms  Eyes: Negative for eye symptoms  Ear/Nose/Throat : Sinus problems  Hematologic/Lymphatic: Negative for Hematologic/Lymphatic symptoms  Cardiovascular : Negative for cardiovascular symptoms  Respiratory : Negative for respiratory symptoms  Endocrine: Negative for endocrine symptoms  Musculoskeletal: Negative for musculoskeletal symptoms  Neurological: Negative for neurological symptoms  Psychologic: Negative for psychiatric symptoms

## 2020-11-25 ENCOUNTER — Telehealth: Payer: Self-pay | Admitting: Hematology

## 2020-11-25 ENCOUNTER — Telehealth: Payer: Self-pay | Admitting: Urology

## 2020-11-25 NOTE — Telephone Encounter (Signed)
Spoke to patient this morning.  He is experiencing stinging pain during urination (described as a 2 or 3 on a pain scale of 0-10) and blood in his urine.  I advised patient that if he experiences fever, nausea, vomiting or uncontrolled pain to go to the emergency room.  Please advise patient how to proceed.

## 2020-11-25 NOTE — Telephone Encounter (Signed)
Pt called this morning and stated that he's passing blood when he uses the bathroom and is having pain. Please call him when you can.

## 2020-11-25 NOTE — Telephone Encounter (Signed)
Rescheduled upcoming appointment due to patient out of town. Patient is aware of changes.

## 2020-11-26 NOTE — Telephone Encounter (Signed)
Returned call to patient- patient is currently out of town.  Reports intermittent small amounts of blood with urination- denies any current pain at present.  History of stone per pt.   Informed patient if pain and hematuria is persistent he will need to go to urgent care to be seen. Pt voiced understanding.

## 2020-11-27 ENCOUNTER — Inpatient Hospital Stay: Payer: 59 | Admitting: Hematology

## 2020-11-27 ENCOUNTER — Inpatient Hospital Stay: Payer: 59

## 2020-12-11 NOTE — Progress Notes (Signed)
HEMATOLOGY/ONCOLOGY CLINIC NOTE  Date of Service: 12/11/2020  Patient Care Team: Dustin Rios as PCP - General (Family Medicine) Dustin Rios as Consulting Physician (Otolaryngology) Dustin Rios as Attending Physician (Radiation Oncology) Dustin Rios (Inactive) as Consulting Physician (Hematology) Dustin Rios as Oncology Nurse Navigator (Oncology)  REFERRING PHYSICIAN: Claretta Fraise, Rios  CHIEF COMPLAINTS/PURPOSE OF CONSULTATION:  Squamous Cell Carcinoma of the Head and Neck  HISTORY OF PRESENTING ILLNESS:   Dustin Leiter Sr. is a wonderful 55 y.o. male who has been referred to [place] by Dustin Rios for evaluation and management of Squamous Cell Carcinoma of the Head and Neck. Pt is accompanied today by Dustin Rios, wife. The pt reports that he is doing well overall.   The pt reports he is fine. Pt has had both of his COVID19 vaccine. He is healing well from surgery. Some of the nerves were damaged and he has been working it. Part of his salivary gland on the right side was taken out and it burns when he starts to eat. Pt has seen swallowing evaluation today prior to coming. He is back to eating whatever he wants to now. When swallowing he has a burning sensation. Pt has a sleep apnea machine that dries out his throat. Pt also cannot open mouth completely since the dissection.  Pt had a biopsy on March 17th and found it was a squamous cell carcinoma. From his surgery he has various burning, tingling and numbness around his head, neck and shoulder.   Pt has sleep apnea. He also had gout but stopped drinking tea and has not had a flair up since the end of last year. He has no other major medical problems and works with Forensic psychologist. He has had slight hearing loss over the years due to job and being in the [employer]. Pt has never smoked, but his mother and brohter smoked a lot and pt was exposed to second hand smoke for about 23 years. Many of the  pt's family members especially from his mother side have had cancer's that were very aggressive and they passed at early ages.   Of note prior to the patient's visit today, pt has had Surgical Pathology ([id number]) completed on 09/11/19 with results revealing "LYMPH NODE, MODIFIED RADICAL NECK I-IV, REGIONAL DISSECTION: Metastatic carcinoma in 3 of 49 lymph nodes (3/49), with extracapsular extension." Pt has had Surgical Pathology ([id number]) completed on 08/21/19 with results revealing "A. TONSIL, RIGHT: Squamous cell carcinoma, basaloid, p16 positive.  Margins not involved.  Carcinoma focally 1 mm from surgical margin.  See oncology table and comment.  B. TONSIL, LEFT:  Benign tonsil.  No malignancy identified. C. TONGUE, RIGHT BASE, BIOPSY:  Benign tonsillar tissue. No malignancy identified. D. TONGUE, LEFT BASE, BIOPSY: Benign tonsillar tissue.  No malignancy identified. "  Pt has had PET Skull Base to Thigh ([id number]) completed on 08/10/19 with results revealing "1. The 2.8 cm right level IIa lymph node has a maximum SUV of 28.4. A primary lesion is not readily seen. There is some asymmetric palatine tonsillar activity with the left tonsil having maximum SUV of 12.4 and the right 7.8, but no tonsillar mass is observed on the CT data. 2. No compelling findings of active malignancy in the chest, abdomen/pelvis, or skeleton. There is some accentuated anal activity which is likely physiologic given the lack of visible abnormality on the CT data. 3. Other imaging findings of potential clinical significance: Diffuse hepatic steatosis. Hypodense left renal  lesion is probably a cyst but technically too small to characterize." Pt has had [place] Core Biopsy ([id number]) completed on  07/26/19 with results revealing "Successful ultrasound-guided right cervical lymph node/lesion Biopsy." Pt has had of CT Soft Tissue Neck ([id number]) completed on 07/13/19 with results revealing "4.1 x 3.2 cm necrotic/cystic  node or nodal conglomerate at the right level II station likely reflecting nodal metastatic disease. No definite primary mass is identified within the oral cavity, pharynx or larynx. Correlate with direct visualization and consider direct tissue sampling. Additionally, PET-CT may be helpful. Cervical spondylosis as described. A prominent C6-C7 posterior disc osteophyte contributes to at least moderate bony spinal canal stenosis. Multilevel neural foraminal narrowing."  Most recent lab results (09/11/19) of CBC is as follows: all values are WNL except for Glucose at 100, Calcium at 8.7  On review of systems, pt reports various tingling, numbness, burnings sensations around head neck and shoulder, weight loss, insomnia and denies back pain, abdominal pain and any other symptoms.   On PMHx the pt reports tonsillectomy 08/21/19, radical neck dissection 09/11/19, sleep apnea, gout, wisdom tooth extraction, carpal tunnel surgery    On Social Hx the pt reports never smoked, smoked marijuana 30 years ago, works Forensic psychologist, pt was in [employer]   On Family Hx the pt reports brother metastatic lung cancer at age 75, Dustin Rios had liver cancer and pancreatic cancer in early 76's of age, mother has uterine cancer at age 35, grandmother on fathers side has stomach cancer, great grandmother had lung cancer    INTERVAL HISTORY:  Dustin Verrico Sr. is a wonderful 55 y.o. male who is here for evaluation and management of Squamous Cell Carcinoma of the Head and Neck. The patient's last visit with [place] was on 05/28/2020. The pt reports that he is doing well overall.  The pt reports no acute new symptoms. Still with some chronic dry mouth. No new swallowing issues or change in voice.  Had visit with Dustin Rios on 10/29/2020 - no evidence of local recurrence of SCC.  Lab results today 12/12/2020 of CBC w/diff and CMP is as follows: all values are WNL. TSH climbing to 4.45  On review of systems, pt reports no  other acute new symptoms.    MEDICAL HISTORY:  Past Medical History:  Diagnosis Date   Arthritis    Change in stool 02/22/2017   Family history of cancer    Family history of uterine cancer    Gout 02/22/2017   Hyperlipidemia    Sleep apnea    Uses CPAP   Tonsil cancer ([place]) DX'd 07/2019   metastatic     SURGICAL HISTORY: Past Surgical History:  Procedure Laterality Date   CARPAL TUNNEL RELEASE Right 04/10/2019   CHOLECYSTECTOMY N/A 09/09/2020   Procedure: LAPAROSCOPIC CHOLECYSTECTOMY;  Surgeon: [name], Rios;  Location: [facility];  Service: General;  Laterality: N/A;   DIRECT LARYNGOSCOPY N/A 08/21/2019   Procedure: DIRECT LARYNGOSCOPY - WITH BX;  Surgeon: [name], Rios;  Location: [facility];  Service: ENT;  Laterality: N/A;   IR GASTROSTOMY TUBE MOD SED  10/13/2019   IR GASTROSTOMY TUBE REMOVAL  06/10/2020   IR IMAGING GUIDED PORT INSERTION  10/13/2019   IR REMOVAL TUN ACCESS W/ PORT W/O FL MOD SED  06/10/2020   NECK DISSECTION  09/11/2019   RADICAL NECK DISSECTION Right 09/11/2019   Procedure: RADICAL NECK DISSECTION;  Surgeon: [name], Rios;  Location: [facility];  Service: ENT;  Laterality: Right;  removal of right jugular vein in neck     TONSILLECTOMY Bilateral 08/21/2019   Procedure: TONSILLECTOMY;  Surgeon: [name], Rios;  Location: [facility];  Service: ENT;  Laterality: Bilateral;   UMBILICAL HERNIA REPAIR  09/09/2020   Procedure: REPAIR UMBILICAL HERNIA;  Surgeon: [name], Rios;  Location: [facility];  Service: General;;   WISDOM TOOTH EXTRACTION       SOCIAL HISTORY: Social History   Socioeconomic History   Marital status: Married    Spouse name: Not on file   Number of children: 1   Years of education: Not on file   Highest education level: Not on file  Occupational History   Occupation: electrician  Tobacco Use   Smoking status: Never   Smokeless tobacco: Never  Vaping Use   Vaping Use: Never used  Substance  and Sexual Activity   Alcohol use: No   Drug use: No   Sexual activity: Not on file  Other Topics Concern   Not on file  Social History Narrative   Patient is married and has 1 son.   Patient has never smoked, used tobacco products.   Patient does not drink.  Patient does not use illicit drugs.   Social Determinants of Health   Financial Resource Strain: Not on file  Food Insecurity: Not on file  Transportation Needs: Not on file  Physical Activity: Not on file  Stress: Not on file  Social Connections: Not on file  Intimate Partner Violence: Not on file     FAMILY HISTORY: Family History  Problem Relation Age of Onset   COPD Mother    Uterine cancer Mother 62   Lung cancer Brother 62       smoker   Lung cancer Maternal Grandfather    Stomach cancer Paternal Grandmother        d. 38s   Cervical cancer Maternal Aunt    Lung cancer Maternal Grandmother        smoker   Prostate cancer Paternal Grandfather    Pancreatic cancer Cousin 33       mother's maternal first cousin   Stomach cancer Cousin 22       mother's maternal first cousin   Cancer Cousin        mother's maternal first cousin with NOS cancer   Cancer Other        MGMs sister with cancer NOS   Lung cancer Other        smoker   Cancer Other        mother's maternal grandfather with NOS cancer     ALLERGIES:   has No Known Allergies.   MEDICATIONS:  Current Outpatient Medications  Medication Sig Dispense Refill   Colchicine 0.6 MG CAPS Take by mouth as needed.     ibuprofen (ADVIL) 200 MG tablet Take 200 mg by mouth every 6 (six) hours as needed for fever, headache or mild pain.     loratadine (CLARITIN) 10 MG tablet Take 10 mg by mouth daily.     ondansetron (ZOFRAN) 4 MG tablet Take 1 tablet (4 mg total) by mouth every 8 (eight) hours as needed. (Patient not taking: Reported on 10/30/2020) 30 tablet 1   oxyCODONE (ROXICODONE) 5 MG immediate release tablet Take 1 tablet (5 mg total) by mouth every 4  (four) hours as needed for severe pain or breakthrough pain. (Patient not taking: Reported on 10/30/2020) 20 tablet 0   No current facility-administered medications for this visit.  Facility-Administered Medications Ordered in Other Visits  Medication Dose Route Frequency Provider Last Rate Last Admin   0.9 %  sodium chloride infusion   Intravenous Continuous Monia Sabal, PA-C         REVIEW OF SYSTEMS:   10 Point review of Systems was done is negative except as noted above. Marland Kitchen   PHYSICAL EXAMINATION: ECOG PERFORMANCE STATUS: 1 - Symptomatic but completely ambulatory  Vitals:   12/12/20 0844  BP: 126/74  Pulse: 64  Resp: 18  Temp: 97.8 F (36.6 C)  SpO2: 98%    Filed Weights   12/12/20 0844  Weight: 253 lb 6.4 oz (114.9 kg)    Body mass index is 38.53 kg/m.   NAD GENERAL:alert, in no acute distress and comfortable SKIN: no acute rashes, no significant lesions EYES: conjunctiva are pink and non-injected, sclera anicteric OROPHARYNX: MMM, no exudates, no oropharyngeal erythema or ulceration NECK: supple, no JVD LYMPH:  no palpable lymphadenopathy in the cervical, axillary or inguinal regions LUNGS: clear to auscultation b/l with normal respiratory effort HEART: regular rate & rhythm ABDOMEN:  normoactive bowel sounds , non tender, not distended. Extremity: no pedal edema PSYCH: alert & oriented x 3 with fluent speech NEURO: no focal motor/sensory deficits   LABORATORY DATA:  I have reviewed the data as listed  CBC Latest Ref Rng & Units 12/12/2020 08/08/2020 06/20/2020  WBC 4.0 - 10.5 K/uL 4.8 11.8(H) 5.5  Hemoglobin 13.0 - 17.0 g/dL 14.9 15.2 14.5  Hematocrit 39.0 - 52.0 % 43.5 45.9 43.7  Platelets 150 - 400 K/uL 169 194 185    CMP Latest Ref Rng & Units 12/12/2020 08/08/2020 06/20/2020  Glucose 70 - 99 mg/dL 96 139(H) 89  BUN 6 - 20 mg/dL '18 17 20  '$ Creatinine 0.61 - 1.24 mg/dL 1.23 1.12 1.25(H)  Sodium 135 - 145 mmol/L 139 135 139  Potassium 3.5 - 5.1 mmol/L  4.4 3.6 4.1  Chloride 98 - 111 mmol/L 104 100 102  CO2 22 - 32 mmol/L '28 25 28  '$ Calcium 8.9 - 10.3 mg/dL 8.9 8.7(L) 9.0  Total Protein 6.5 - 8.1 g/dL 6.8 7.5 7.3  Total Bilirubin 0.3 - 1.2 mg/dL 0.8 0.6 0.4  Alkaline Phos 38 - 126 U/L 59 65 76  AST 15 - 41 U/L '20 25 17  '$ ALT 0 - 44 U/L '17 21 15     '$ RADIOGRAPHIC STUDIES: I have personally reviewed the radiological images as listed and agreed with the findings in the report.  PET/CT 08/10/2019: IMPRESSION: 1. The 2.8 cm right level IIa lymph node has a maximum SUV of 28.4. A primary lesion is not readily seen. There is some asymmetric palatine tonsillar activity with the left tonsil having maximum SUV of 12.4 and the right 7.8, but no tonsillar mass is observed on the CT data. 2. No compelling findings of active malignancy in the chest, abdomen/pelvis, or skeleton. There is some accentuated anal activity which is likely physiologic given the lack of visible abnormality on the CT data. 3. Other imaging findings of potential clinical significance: Diffuse hepatic steatosis. Hypodense left renal lesion is probably a cyst but technically too small to characterize.     Electronically Signed   By: Van Clines M.D.   On: 08/10/2019 15:54    08/21/19 of Surgical Pathology [id number]    SURGICAL PATHOLOGY  * THIS IS AN AMENDED REPORT   CASE: [id number]  PATIENT: Dustin Leiter  Surgical Pathology Report   Amendment    Reason for Amendment #  1: Other   Clinical History: squamous cell of neck and head (cm)     FINAL MICROSCOPIC DIAGNOSIS:   A. TONSIL, RIGHT:  - Squamous cell carcinoma, basaloid, p16 positive.  - Margins not involved.  - Carcinoma focally 1 mm from surgical margin.  - See oncology table and comment.   B. TONSIL, LEFT:  - Benign tonsil.  - No malignancy identified.   C. TONGUE, RIGHT BASE, BIOPSY:  - Benign tonsillar tissue.  - No malignancy identified.   D. TONGUE, LEFT BASE, BIOPSY:  -  Benign tonsillar tissue.  - No malignancy identified.   ONCOLOGY TABLE:  PHARYNX (OROPHARYNX, HYPOPHARYNX, NASOPHARYNX):  Procedure: Right and left tonsillectomy and right and left tongue base  biopsies.  Tumor Site: Right tonsil.  Tumor Laterality: Right tonsil.  Tumor Focality: Unifocal.  Tumor Size: 1 cm x 0.3 cm (3 mm) depth.  Histologic Type: Squamous cell carcinoma, basaloid type, p16 positive,  EBV negative.  Histologic Grade: Poorly differentiated.  Margins: Free of tumor.  Carcinoma focally 1 mm from the nearest  surgical margin.       Distance to closest Margin: 1 mm.  Lymphovascular Invasion: Present.  Perineural Invasion: Not identified.  Regional Lymph Nodes: No lymph nodes submitted with this case.  The  previous right neck lymph node needle core biopsy (MCS [id number] [phone]) shows  squamous cell carcinoma.  Pathologic Stage Classification (pTNM, AJCC 8th Edition): pT1, pN1, see  comment.    09/11/19 of Surgical Pathology Report [id number])  COMMENT:   Metastatic carcinoma is present in 3 lymph nodes - 1 level 1 lymph node,  1 level 2 lymph node (this metastatic focus also involves skeletal  muscle), and 1 level 3 lymph node (highlighted by a cytokeratin  immunohistochemical stain).  Additional studies can be performed (block  A11) upon clinician request.    ASSESSMENT & PLAN:   Dustin Leiter Sr. is a 55 y.o. male with:  Rt Tonsilar Basaloid Squamous cell carcinoma , HPV+ve. PT1,pN1,M0. 3/49 LN+ve High risk features - LVI, extranodal extension, high risk morphology. Significant second hand smoke exposure S/p b/l tonsillectomy on 08/21/2019 S/p rt radical neck dissection  2. Borderline elevated TSH levels - will monitor closely.  PLAN: -Discussed pt labwork cbc/cmp wnl  -recent ent evaluation with no clinical concern for local SCC recurrence at this time. -TSH - borderline -- will recheck TSH and free t4 on next labs CT chest to monitor for metastatic  disease in chest   -continue f/u with Dustin Olga Coaster as per his recommendations.  FOLLOW UP: CT chest in 4 months RTC with Dustin Irene Limbo with labs in 5 months    The total time spent in the appointment was 20 minutes and more than 50% was on counseling and direct patient cares.   Sullivan Lone Rios [place] AAHIVMS [facility]  (Office):       [phone] (Work cell):  [phone] (Fax):           [phone]  12/11/2020 6:49 PM  I, Reinaldo Raddle, am acting as scribe for Dustin. Sullivan Lone, Rios.     .I have reviewed the above documentation for accuracy and completeness, and I agree with the above. Brunetta Genera Rios

## 2020-12-12 ENCOUNTER — Other Ambulatory Visit: Payer: Self-pay

## 2020-12-12 ENCOUNTER — Inpatient Hospital Stay (HOSPITAL_BASED_OUTPATIENT_CLINIC_OR_DEPARTMENT_OTHER): Payer: 59 | Admitting: Hematology

## 2020-12-12 ENCOUNTER — Inpatient Hospital Stay: Payer: 59 | Attending: Hematology

## 2020-12-12 VITALS — BP 126/74 | HR 64 | Temp 97.8°F | Resp 18 | Ht 68.0 in | Wt 253.4 lb

## 2020-12-12 DIAGNOSIS — M129 Arthropathy, unspecified: Secondary | ICD-10-CM | POA: Insufficient documentation

## 2020-12-12 DIAGNOSIS — C7989 Secondary malignant neoplasm of other specified sites: Secondary | ICD-10-CM | POA: Insufficient documentation

## 2020-12-12 DIAGNOSIS — Z801 Family history of malignant neoplasm of trachea, bronchus and lung: Secondary | ICD-10-CM | POA: Insufficient documentation

## 2020-12-12 DIAGNOSIS — E785 Hyperlipidemia, unspecified: Secondary | ICD-10-CM | POA: Insufficient documentation

## 2020-12-12 DIAGNOSIS — Z79899 Other long term (current) drug therapy: Secondary | ICD-10-CM | POA: Diagnosis not present

## 2020-12-12 DIAGNOSIS — C099 Malignant neoplasm of tonsil, unspecified: Secondary | ICD-10-CM | POA: Diagnosis not present

## 2020-12-12 DIAGNOSIS — R682 Dry mouth, unspecified: Secondary | ICD-10-CM | POA: Insufficient documentation

## 2020-12-12 DIAGNOSIS — Z800 Family history of malignant neoplasm of digestive organs: Secondary | ICD-10-CM | POA: Insufficient documentation

## 2020-12-12 DIAGNOSIS — G4730 Sleep apnea, unspecified: Secondary | ICD-10-CM | POA: Diagnosis not present

## 2020-12-12 LAB — CMP (CANCER CENTER ONLY)
ALT: 17 U/L (ref 0–44)
AST: 20 U/L (ref 15–41)
Albumin: 3.7 g/dL (ref 3.5–5.0)
Alkaline Phosphatase: 59 U/L (ref 38–126)
Anion gap: 7 (ref 5–15)
BUN: 18 mg/dL (ref 6–20)
CO2: 28 mmol/L (ref 22–32)
Calcium: 8.9 mg/dL (ref 8.9–10.3)
Chloride: 104 mmol/L (ref 98–111)
Creatinine: 1.23 mg/dL (ref 0.61–1.24)
GFR, Estimated: >60 mL/min (ref >60)
Glucose, Bld: 96 mg/dL (ref 70–99)
Potassium: 4.4 mmol/L (ref 3.5–5.1)
Sodium: 139 mmol/L (ref 135–145)
Total Bilirubin: 0.8 mg/dL (ref 0.3–1.2)
Total Protein: 6.8 g/dL (ref 6.5–8.1)

## 2020-12-12 LAB — CBC WITH DIFFERENTIAL/PLATELET
Abs Immature Granulocytes: 0.01 10*3/uL (ref 0.00–0.07)
Basophils Absolute: 0 10*3/uL (ref 0.0–0.1)
Basophils Relative: 1 %
Eosinophils Absolute: 0.1 10*3/uL (ref 0.0–0.5)
Eosinophils Relative: 3 %
HCT: 43.5 % (ref 39.0–52.0)
Hemoglobin: 14.9 g/dL (ref 13.0–17.0)
Immature Granulocytes: 0 %
Lymphocytes Relative: 16 %
Lymphs Abs: 0.8 10*3/uL (ref 0.7–4.0)
MCH: 30.2 pg (ref 26.0–34.0)
MCHC: 34.3 g/dL (ref 30.0–36.0)
MCV: 88.1 fL (ref 80.0–100.0)
Monocytes Absolute: 0.5 10*3/uL (ref 0.1–1.0)
Monocytes Relative: 9 %
Neutro Abs: 3.4 10*3/uL (ref 1.7–7.7)
Neutrophils Relative %: 71 %
Platelets: 169 10*3/uL (ref 150–400)
RBC: 4.94 MIL/uL (ref 4.22–5.81)
RDW: 13.4 % (ref 11.5–15.5)
WBC: 4.8 10*3/uL (ref 4.0–10.5)
nRBC: 0 % (ref 0.0–0.2)

## 2020-12-12 LAB — TSH: TSH: 4.456 u[IU]/mL — ABNORMAL HIGH (ref 0.320–4.118)

## 2020-12-24 ENCOUNTER — Encounter: Payer: Self-pay | Admitting: Hematology

## 2020-12-31 ENCOUNTER — Other Ambulatory Visit: Payer: Self-pay

## 2020-12-31 ENCOUNTER — Encounter: Payer: Self-pay | Admitting: Family Medicine

## 2020-12-31 ENCOUNTER — Ambulatory Visit (INDEPENDENT_AMBULATORY_CARE_PROVIDER_SITE_OTHER): Payer: 59 | Admitting: Family Medicine

## 2020-12-31 VITALS — BP 135/91 | HR 78 | Temp 98.4°F | Ht 68.0 in | Wt 247.6 lb

## 2020-12-31 DIAGNOSIS — E559 Vitamin D deficiency, unspecified: Secondary | ICD-10-CM | POA: Diagnosis not present

## 2020-12-31 DIAGNOSIS — E038 Other specified hypothyroidism: Secondary | ICD-10-CM

## 2020-12-31 DIAGNOSIS — Z0000 Encounter for general adult medical examination without abnormal findings: Secondary | ICD-10-CM

## 2020-12-31 DIAGNOSIS — E782 Mixed hyperlipidemia: Secondary | ICD-10-CM | POA: Diagnosis not present

## 2020-12-31 DIAGNOSIS — Z125 Encounter for screening for malignant neoplasm of prostate: Secondary | ICD-10-CM | POA: Diagnosis not present

## 2020-12-31 DIAGNOSIS — Z0001 Encounter for general adult medical examination with abnormal findings: Secondary | ICD-10-CM | POA: Diagnosis not present

## 2020-12-31 DIAGNOSIS — Z1211 Encounter for screening for malignant neoplasm of colon: Secondary | ICD-10-CM

## 2020-12-31 MED ORDER — COLCHICINE 0.6 MG PO CAPS: ORAL_CAPSULE | ORAL | 11 refills | Status: AC

## 2020-12-31 NOTE — Progress Notes (Signed)
Subjective:  Patient ID: Dustin Leiter Sr., male    DOB: [birth year]  Age: 55 y.o. MRN: [id number]  CC: Annual Exam   HPI Dustin Mcnelly Sr. presents for Annual physical. In remission from tonsilar fossa cancer. Had cholecystectomy in May. Abd. Pain much better.   Lost 75 pounds. Had PEG tube last year. Has dry mouth and lost some ROM for RUE as a result of neck dissection.   Depression screen [facility] 2/9 12/31/2020 12/31/2020 03/27/2020  Decreased Interest 1 0 0  Down, Depressed, Hopeless 0 0 0  PHQ - 2 Score 1 0 0  Altered sleeping 1 - -  Tired, decreased energy 1 - -  Change in appetite 1 - -  Feeling bad or failure about yourself  0 - -  Trouble concentrating 1 - -  Moving slowly or fidgety/restless 0 - -  Suicidal thoughts 0 - -  PHQ-9 Score 5 - -  Difficult doing work/chores Not difficult at all - -    History Dustin Rios has a past medical history of Arthritis, Change in stool (02/22/2017), Family history of cancer, Family history of uterine cancer, Gout (02/22/2017), Hyperlipidemia, Sleep apnea, and Tonsil cancer ([place]) (DX'd 07/2019).   He has a past surgical history that includes Carpal tunnel release (Right, 04/10/2019); Tonsillectomy (Bilateral, 08/21/2019); Direct laryngoscopy (N/A, 08/21/2019); Wisdom tooth extraction; Neck dissection (09/11/2019); Radical neck dissection (Right, 09/11/2019); IR IMAGING GUIDED PORT INSERTION (10/13/2019); IR GASTROSTOMY TUBE MOD SED (10/13/2019); IR REMOVAL TUN ACCESS W/ PORT W/O FL MOD SED (06/10/2020); IR GASTROSTOMY TUBE REMOVAL/REPAIR (06/10/2020); removal of right jugular vein in neck; Cholecystectomy (N/A, 99991111); and Umbilical hernia repair (09/09/2020).   His family history includes COPD in his mother; Cancer in his cousin and other family members; Cervical cancer in his maternal aunt; Lung cancer in his maternal grandfather, maternal grandmother, and another family member; Lung cancer (age of onset: 60) in his brother; Pancreatic cancer (age of  onset: 64) in his cousin; Prostate cancer in his paternal grandfather; Stomach cancer in his paternal grandmother; Stomach cancer (age of onset: 92) in his cousin; Uterine cancer (age of onset: 80) in his mother.He reports that he has never smoked. He has never used smokeless tobacco. He reports that he does not drink alcohol and does not use drugs.    ROS Review of Systems  Constitutional:  Negative for activity change, fatigue and unexpected weight change.  HENT:  Positive for trouble swallowing (due dry mouth. Possible scar tissue.). Negative for congestion, ear pain, hearing loss and postnasal drip.   Eyes:  Negative for pain and visual disturbance.  Respiratory:  Negative for cough, chest tightness and shortness of breath.   Cardiovascular:  Negative for chest pain, palpitations and leg swelling.  Gastrointestinal:  Negative for abdominal distention, abdominal pain, blood in stool, constipation, diarrhea, nausea and vomiting.  Endocrine: Negative for cold intolerance, heat intolerance and polydipsia.  Genitourinary:  Negative for difficulty urinating, dysuria, flank pain, frequency and urgency.  Musculoskeletal:  Negative for arthralgias and joint swelling.  Skin:  Negative for color change, rash and wound.  Neurological:  Negative for dizziness, syncope, speech difficulty, weakness, light-headedness, numbness and headaches.  Hematological:  Does not bruise/bleed easily.  Psychiatric/Behavioral:  Negative for confusion, decreased concentration, dysphoric mood and sleep disturbance. The patient is not nervous/anxious.    Objective:  BP (!) 135/91   Pulse 78   Temp 98.4 F (36.9 C)   Ht '5\' 8"'$  (1.727 m)   Wt 247 lb 9.6 oz (112.3  kg)   SpO2 95%   BMI 37.65 kg/m   BP Readings from Last 3 Encounters:  12/31/20 (!) 135/91  12/12/20 126/74  10/30/20 122/78    Wt Readings from Last 3 Encounters:  12/31/20 247 lb 9.6 oz (112.3 kg)  12/12/20 253 lb 6.4 oz (114.9 kg)  10/30/20 250 lb  6 oz (113.6 kg)     Physical Exam Constitutional:      Appearance: He is well-developed.  HENT:     Head: Normocephalic and atraumatic.  Eyes:     Pupils: Pupils are equal, round, and reactive to light.  Neck:     Thyroid: No thyromegaly.     Trachea: No tracheal deviation.  Cardiovascular:     Rate and Rhythm: Normal rate and regular rhythm.     Heart sounds: Normal heart sounds. No murmur heard.   No friction rub. No gallop.  Pulmonary:     Breath sounds: Normal breath sounds. No wheezing or rales.  Abdominal:     General: Bowel sounds are normal. There is no distension.     Palpations: Abdomen is soft. There is no mass.     Tenderness: no abdominal tenderness     Hernia: There is no hernia in the left inguinal area.  Genitourinary:    Penis: Normal.      Testes: Normal.  Musculoskeletal:        General: Normal range of motion.     Cervical back: Normal range of motion.  Lymphadenopathy:     Cervical: No cervical adenopathy.  Skin:    General: Skin is warm and dry.  Neurological:     Mental Status: He is alert and oriented to person, place, and time.      Assessment & Plan:   Savant was seen today for annual exam.  Diagnoses and all orders for this visit:  Well adult exam  Mixed hyperlipidemia -     Lipid panel  Vitamin D deficiency -     VITAMIN D 25 Hydroxy (Vit-D Deficiency, Fractures)  Screening for prostate cancer -     PSA, total and free  Other specified hypothyroidism -     T4, free -     T3, Free  Screen for colon cancer -     Ambulatory referral to Gastroenterology  Other orders -     Colchicine (MITIGARE) 0.6 MG CAPS; Take every two hours at onset of gout attack until pain resolves or 6 have been taken. Then decrease to two daily until flare ends      I have discontinued Dustin Leiter Sr.'s Colchicine. I am also having him start on Colchicine. Additionally, I am having him maintain his loratadine, ibuprofen, oxyCODONE,  ondansetron, and fluticasone.  Allergies as of 12/31/2020   No Known Allergies      Medication List        Accurate as of December 31, 2020  2:02 PM. If you have any questions, ask your nurse or doctor.          Colchicine 0.6 MG Caps Commonly known as: Mitigare Take every two hours at onset of gout attack until pain resolves or 6 have been taken. Then decrease to two daily until flare ends What changed:  how to take this when to take this reasons to take this additional instructions Changed by: Claretta Fraise, MD   fluticasone 50 MCG/ACT nasal spray Commonly known as: FLONASE Place 2 sprays into both nostrils daily.   ibuprofen 200 MG tablet Commonly known  as: ADVIL Take 200 mg by mouth every 6 (six) hours as needed for fever, headache or mild pain.   loratadine 10 MG tablet Commonly known as: CLARITIN Take 10 mg by mouth daily.   ondansetron 4 MG tablet Commonly known as: Zofran Take 1 tablet (4 mg total) by mouth every 8 (eight) hours as needed.   oxyCODONE 5 MG immediate release tablet Commonly known as: Roxicodone Take 1 tablet (5 mg total) by mouth every 4 (four) hours as needed for severe pain or breakthrough pain.         Follow-up: Return in about 1 year (around 12/31/2021).  Claretta Fraise, M.D.

## 2021-01-01 LAB — LIPID PANEL
Chol/HDL Ratio: 3.7 ratio (ref 0.0–5.0)
Cholesterol, Total: 157 mg/dL (ref 100–199)
HDL: 43 mg/dL (ref >39)
LDL Chol Calc (NIH): 93 mg/dL (ref 0–99)
Triglycerides: 118 mg/dL (ref 0–149)
VLDL Cholesterol Cal: 21 mg/dL (ref 5–40)

## 2021-01-01 LAB — VITAMIN D 25 HYDROXY (VIT D DEFICIENCY, FRACTURES): Vit D, 25-Hydroxy: 47.6 ng/mL (ref 30.0–100.0)

## 2021-01-01 LAB — PSA, TOTAL AND FREE
PSA, Free Pct: 45.7 %
PSA, Free: 0.32 ng/mL
Prostate Specific Ag, Serum: 0.7 ng/mL (ref 0.0–4.0)

## 2021-01-01 LAB — T3, FREE: T3, Free: 3.2 pg/mL (ref 2.0–4.4)

## 2021-01-01 LAB — T4, FREE: Free T4: 1.03 ng/dL (ref 0.82–1.77)

## 2021-01-02 ENCOUNTER — Encounter (INDEPENDENT_AMBULATORY_CARE_PROVIDER_SITE_OTHER): Payer: Self-pay | Admitting: *Deleted

## 2021-01-03 NOTE — Progress Notes (Signed)
Hello Pavel,  Your lab result is normal and/or stable.Some minor variations that are not significant are commonly marked abnormal, but do not represent any medical problem for you.  Best regards, Adine Heimann, M.D.

## 2021-01-31 ENCOUNTER — Telehealth (INDEPENDENT_AMBULATORY_CARE_PROVIDER_SITE_OTHER): Payer: Self-pay

## 2021-01-31 ENCOUNTER — Other Ambulatory Visit (INDEPENDENT_AMBULATORY_CARE_PROVIDER_SITE_OTHER): Payer: Self-pay

## 2021-01-31 ENCOUNTER — Encounter (INDEPENDENT_AMBULATORY_CARE_PROVIDER_SITE_OTHER): Payer: Self-pay

## 2021-01-31 DIAGNOSIS — Z1211 Encounter for screening for malignant neoplasm of colon: Secondary | ICD-10-CM

## 2021-01-31 MED ORDER — PEG 3350-KCL-NA BICARB-NACL 420 G PO SOLR: 4000.0000 mL | ORAL | 0 refills | Status: DC

## 2021-01-31 NOTE — Telephone Encounter (Signed)
Dustin Rios, CMA  

## 2021-01-31 NOTE — Telephone Encounter (Signed)
Referring MD/PCP: STACKS  Procedure: Tcs  Reason/Indication:  Screening  Has patient had this procedure before?  yes  If so, when, by whom and where?  10 years ago  Is there a family history of colon cancer?  no  Who?  What age when diagnosed?    Is patient diabetic? If yes, Type 1 or Type 2   no      Does patient have prosthetic heart valve or mechanical valve?  no  Do you have a pacemaker/defibrillator?  no  Has patient ever had endocarditis/atrial fibrillation? no  Does patient use oxygen? no  Has patient had joint replacement within last 12 months?  no  Is patient constipated or do they take laxatives? no  Does patient have a history of alcohol/drug use?  no  Have you had a stroke/heart attack last 6 mths? no  Do you take medicine for weight loss?  no  For male patients,: do you still have your menstrual cycle? N/A  Is patient on blood thinner such as Coumadin, Plavix and/or Aspirin? no  Medications: Flonase, Claritin, Advil prn  Allergies: nkda  Medication Adjustment per Dr Laural Golden none  Procedure date & time: 02/20/21 9:30

## 2021-02-20 ENCOUNTER — Encounter (HOSPITAL_COMMUNITY)
Admission: RE | Disposition: A | Discharge status: Home / Self Care | Payer: Self-pay | Source: Home / Self Care | Attending: Internal Medicine

## 2021-02-20 ENCOUNTER — Other Ambulatory Visit: Payer: Self-pay

## 2021-02-20 ENCOUNTER — Encounter (HOSPITAL_COMMUNITY): Payer: Self-pay | Admitting: Internal Medicine

## 2021-02-20 ENCOUNTER — Ambulatory Visit (HOSPITAL_COMMUNITY)
Admission: RE | Admit: 2021-02-20 | Discharge: 2021-02-20 | Disposition: A | Discharge status: Home / Self Care | Payer: 59 | Attending: Internal Medicine | Admitting: Internal Medicine

## 2021-02-20 DIAGNOSIS — D123 Benign neoplasm of transverse colon: Secondary | ICD-10-CM | POA: Insufficient documentation

## 2021-02-20 DIAGNOSIS — Z8049 Family history of malignant neoplasm of other genital organs: Secondary | ICD-10-CM | POA: Diagnosis not present

## 2021-02-20 DIAGNOSIS — Z801 Family history of malignant neoplasm of trachea, bronchus and lung: Secondary | ICD-10-CM | POA: Insufficient documentation

## 2021-02-20 DIAGNOSIS — Z791 Long term (current) use of non-steroidal anti-inflammatories (NSAID): Secondary | ICD-10-CM | POA: Insufficient documentation

## 2021-02-20 DIAGNOSIS — Z1211 Encounter for screening for malignant neoplasm of colon: Secondary | ICD-10-CM | POA: Insufficient documentation

## 2021-02-20 DIAGNOSIS — Z79899 Other long term (current) drug therapy: Secondary | ICD-10-CM | POA: Diagnosis not present

## 2021-02-20 DIAGNOSIS — D120 Benign neoplasm of cecum: Secondary | ICD-10-CM | POA: Insufficient documentation

## 2021-02-20 DIAGNOSIS — Z85818 Personal history of malignant neoplasm of other sites of lip, oral cavity, and pharynx: Secondary | ICD-10-CM | POA: Insufficient documentation

## 2021-02-20 DIAGNOSIS — Z09 Encounter for follow-up examination after completed treatment for conditions other than malignant neoplasm: Secondary | ICD-10-CM | POA: Diagnosis not present

## 2021-02-20 DIAGNOSIS — Z86010 Personal history of colonic polyps: Secondary | ICD-10-CM | POA: Diagnosis not present

## 2021-02-20 DIAGNOSIS — Z923 Personal history of irradiation: Secondary | ICD-10-CM | POA: Insufficient documentation

## 2021-02-20 DIAGNOSIS — K644 Residual hemorrhoidal skin tags: Secondary | ICD-10-CM

## 2021-02-20 DIAGNOSIS — Z800 Family history of malignant neoplasm of digestive organs: Secondary | ICD-10-CM | POA: Diagnosis not present

## 2021-02-20 HISTORY — PX: POLYPECTOMY: SHX5525

## 2021-02-20 HISTORY — PX: COLONOSCOPY: SHX5424

## 2021-02-20 LAB — HM COLONOSCOPY

## 2021-02-20 SURGERY — COLONOSCOPY
Anesthesia: Moderate Sedation

## 2021-02-20 MED ORDER — SODIUM CHLORIDE 0.9 % IV SOLN: INTRAVENOUS | Status: DC

## 2021-02-20 MED ORDER — MIDAZOLAM HCL 5 MG/5ML IJ SOLN
INTRAMUSCULAR | Status: DC | PRN
  Administered 2021-02-20 (×3): 2 mg via INTRAVENOUS
  Administered 2021-02-20: 3 mg via INTRAVENOUS

## 2021-02-20 MED ORDER — MIDAZOLAM HCL 5 MG/5ML IJ SOLN
INTRAMUSCULAR | Status: AC
  Filled 2021-02-20: qty 10

## 2021-02-20 MED ORDER — MEPERIDINE HCL 50 MG/ML IJ SOLN
INTRAMUSCULAR | Status: DC | PRN
  Administered 2021-02-20 (×2): 25 mg

## 2021-02-20 MED ORDER — MEPERIDINE HCL 50 MG/ML IJ SOLN
INTRAMUSCULAR | Status: AC
  Filled 2021-02-20: qty 1

## 2021-02-20 NOTE — H&P (Signed)
Dustin Leiter Sr. is an 55 y.o. male.   Chief Complaint: Patient is here for colonoscopy HPI: Patient is 55 year old Caucasian male who is here for screening colonoscopy.  Last exam in March 2010 was normal other than external hemorrhoids.  He denies abdominal pain change in bowel habits or rectal bleeding. Family history is negative for colorectal carcinoma.  Personal history significant for tonsillar carcinoma for which she required radical neck dissection followed by chemo and radiation therapy.  He had PEG tube for feeding but its been removed.  His surgery was 18 months ago.  Past Medical History:  Diagnosis Date   Arthritis    Change in stool 02/22/2017   Family history of cancer    Family history of uterine cancer    Gout 02/22/2017   Hyperlipidemia    Sleep apnea    Uses CPAP   Tonsil cancer ([place]) DX'd 07/2019   metastatic    Past Surgical History:  Procedure Laterality Date   CARPAL TUNNEL RELEASE Right 04/10/2019   CHOLECYSTECTOMY N/A 09/09/2020   Procedure: LAPAROSCOPIC CHOLECYSTECTOMY;  Surgeon: [name], MD;  Location: [facility];  Service: General;  Laterality: N/A;   DIRECT LARYNGOSCOPY N/A 08/21/2019   Procedure: DIRECT LARYNGOSCOPY - WITH BX;  Surgeon: [name], MD;  Location: [facility];  Service: ENT;  Laterality: N/A;   IR GASTROSTOMY TUBE MOD SED  10/13/2019   IR GASTROSTOMY TUBE REMOVAL  06/10/2020   IR IMAGING GUIDED PORT INSERTION  10/13/2019   IR REMOVAL TUN ACCESS W/ PORT W/O FL MOD SED  06/10/2020   NECK DISSECTION  09/11/2019   RADICAL NECK DISSECTION Right 09/11/2019   Procedure: RADICAL NECK DISSECTION;  Surgeon: [name], MD;  Location: [facility];  Service: ENT;  Laterality: Right;   removal of right jugular vein in neck     TONSILLECTOMY Bilateral 08/21/2019   Procedure: TONSILLECTOMY;  Surgeon: [name], MD;  Location: [facility];  Service: ENT;  Laterality: Bilateral;   UMBILICAL HERNIA REPAIR  09/09/2020    Procedure: REPAIR UMBILICAL HERNIA;  Surgeon: [name], MD;  Location: [facility];  Service: General;;   WISDOM TOOTH EXTRACTION      Family History  Problem Relation Age of Onset   COPD Mother    Uterine cancer Mother 77   Lung cancer Brother 83       smoker   Lung cancer Maternal Grandfather    Stomach cancer Paternal Grandmother        d. 63s   Cervical cancer Maternal Aunt    Lung cancer Maternal Grandmother        smoker   Prostate cancer Paternal Grandfather    Pancreatic cancer Cousin 41       mother's maternal first cousin   Stomach cancer Cousin 16       mother's maternal first cousin   Cancer Cousin        mother's maternal first cousin with NOS cancer   Cancer Other        MGMs sister with cancer NOS   Lung cancer Other        smoker   Cancer Other        mother's maternal grandfather with NOS cancer   Social History:  reports that he has never smoked. He has never used smokeless tobacco. He reports that he does not drink alcohol and does not use drugs.  Allergies: No Known Allergies  Medications Prior to Admission  Medication Sig  Dispense Refill   fluticasone (FLONASE) 50 MCG/ACT nasal spray Place 2 sprays into both nostrils daily as needed for allergies.     Colchicine (MITIGARE) 0.6 MG CAPS Take every two hours at onset of gout attack until pain resolves or 6 have been taken. Then decrease to two daily until flare ends (Patient taking differently: Take 0.6 mg by mouth daily as needed (gout). Take every two hours at onset of gout attack until pain resolves or 6 have been taken. Then decrease to two daily until flare ends) 30 capsule 11   ibuprofen (ADVIL) 200 MG tablet Take 600 mg by mouth every 8 (eight) hours as needed for fever, headache or mild pain.     ondansetron (ZOFRAN) 4 MG tablet Take 1 tablet (4 mg total) by mouth every 8 (eight) hours as needed. (Patient not taking: No sig reported) 30 tablet 1   oxyCODONE (ROXICODONE) 5 MG immediate release  tablet Take 1 tablet (5 mg total) by mouth every 4 (four) hours as needed for severe pain or breakthrough pain. (Patient not taking: No sig reported) 20 tablet 0   polyethylene glycol-electrolytes (TRILYTE) 420 g solution Take 4,000 mLs by mouth as directed. 4000 mL 0    No results found for this or any previous visit (from the past 48 hour(s)). No results found.  Review of Systems  Blood pressure 133/78, pulse 84, temperature 98.9 F (37.2 C), temperature source Oral, resp. rate 17, height 5' 8.5" (1.74 m), weight 112 kg, SpO2 96 %. Physical Exam HENT:     Mouth/Throat:     Mouth: Mucous membranes are moist.     Pharynx: Oropharynx is clear.  Eyes:     General: No scleral icterus.    Conjunctiva/sclera: Conjunctivae normal.  Neck:     Comments: He has limited neck movement and he has  scarring on the right side of neck from prior radical neck dissection.  On palpation this area is firm but no adenopathy noted. Cardiovascular:     Rate and Rhythm: Normal rate and regular rhythm.     Heart sounds: Normal heart sounds. No murmur heard. Pulmonary:     Effort: Pulmonary effort is normal.     Breath sounds: Normal breath sounds.  Abdominal:     Comments: Abdomen is full.  Gastrostomy scar noted in left upper quadrant of abdomen.  He has small area of ecchymosis right lower quadrant.  On palpation abdomen is soft and nontender with organomegaly or masses.  Musculoskeletal:        General: No swelling.  Skin:    General: Skin is warm and dry.  Neurological:     Mental Status: He is alert.     Assessment/Plan  Average risk screening colonoscopy.  Hildred Laser, MD 02/20/2021, 9:24 AM

## 2021-02-20 NOTE — Discharge Instructions (Addendum)
No aspirin or NSAIDs for 24 hours. Resume other medications as before. Resume usual diet. No driving for 24 hours. Physician will call with biopsy results and further recommendations.

## 2021-02-20 NOTE — Op Note (Signed)
[facility] Patient Name: Dustin Rios Procedure Date: 02/20/2021 9:15 AM MRN: [id number] Date of Birth: March 06, 1966 Attending MD: Hildred Laser , MD CSN: [id number] Age: 55 Admit Type: Outpatient Procedure:                Colonoscopy Indications:              Screening for colorectal malignant neoplasm Providers:                Hildred Laser, MD, Janeece Riggers, RN, Kristine L.                            Risa Grill, Technician Referring MD:             Claretta Fraise, MD Medicines:                Meperidine 50 mg IV, Midazolam 9 mg IV Complications:            No immediate complications. Estimated Blood Loss:     Estimated blood loss was minimal. Procedure:                Pre-Anesthesia Assessment:                           - Prior to the procedure, a History and Physical                            was performed, and patient medications and                            allergies were reviewed. The patient's tolerance of                            previous anesthesia was also reviewed. The risks                            and benefits of the procedure and the sedation                            options and risks were discussed with the patient.                            All questions were answered, and informed consent                            was obtained. Prior Anticoagulants: The patient has                            taken no previous anticoagulant or antiplatelet                            agents. ASA Grade Assessment: II - A patient with                            mild systemic disease. After reviewing the risks  and benefits, the patient was deemed in                            satisfactory condition to undergo the procedure.                           After obtaining informed consent, the colonoscope                            was passed under direct vision. Throughout the                            procedure, the patient's blood pressure, pulse,  and                            oxygen saturations were monitored continuously. The                            PCF-HQ190L ([id number]) scope was introduced through                            the anus and advanced to the the cecum, identified                            by appendiceal orifice and ileocecal valve. The                            colonoscopy was performed without difficulty. The                            patient tolerated the procedure well. The quality                            of the bowel preparation was adequate. The                            ileocecal valve, appendiceal orifice, and rectum                            were photographed. Scope In: 9:37:04 AM Scope Out: 10:00:32 AM Scope Withdrawal Time: 0 hours 7 minutes 55 seconds  Total Procedure Duration: 0 hours 23 minutes 28 seconds  Findings:      The perianal and digital rectal examinations were normal.      Four sessile polyps were found in the splenic flexure, transverse colon       and cecum. The polyps were 4 to 6 mm in size. These polyps were removed       with a cold snare. Resection and retrieval were complete. The pathology       specimen was placed into Bottle Number 1.      A 5 mm polyp was found in the distal transverse colon. The polyp was       sessile. The polyp was removed with a cold snare. Resection and       retrieval were complete. The pathology specimen was placed into  Bottle       Number 1.      External hemorrhoids were found during retroflexion. The hemorrhoids       were small. Impression:               - Four 4 to 6 mm polyps at the splenic flexure, in                            the transverse colon and in the cecum, removed with                            a cold snare. Resected and retrieved.                           - One 5 mm polyp in the distal transverse colon,                            removed with a cold snare. Resected and retrieved.                           - External  hemorrhoids. Moderate Sedation:      Moderate (conscious) sedation was administered by the endoscopy nurse       and supervised by the endoscopist. The following parameters were       monitored: oxygen saturation, heart rate, blood pressure, CO2       capnography and response to care. Total physician intraservice time was       29 minutes. Recommendation:           - Patient has a contact number available for                            emergencies. The signs and symptoms of potential                            delayed complications were discussed with the                            patient. Return to normal activities tomorrow.                            Written discharge instructions were provided to the                            patient.                           - Resume previous diet today.                           - Continue present medications.                           - No aspirin, ibuprofen, naproxen, or other                            non-steroidal anti-inflammatory  drugs for 1 day.                           - Await pathology results.                           - Repeat colonoscopy is recommended. The                            colonoscopy date will be determined after pathology                            results from today's exam become available for                            review. Procedure Code(s):        --- Professional ---                           [phone], Colonoscopy, flexible; with removal of                            tumor(s), polyp(s), or other lesion(s) by snare                            technique                           99153, Moderate sedation; each additional 15                            minutes intraservice time                           G0500, Moderate sedation services provided by the                            same physician or other qualified health care                            professional performing a gastrointestinal                             endoscopic service that sedation supports,                            requiring the presence of an independent trained                            observer to assist in the monitoring of the                            patient's level of consciousness and physiological                            status; initial 15 minutes of intra-service time;  patient age 26 years or older (additional time may                            be reported with [phone], as appropriate) Diagnosis Code(s):        --- Professional ---                           K63.5, Polyp of colon                           Z12.11, Encounter for screening for malignant                            neoplasm of colon                           K64.4, Residual hemorrhoidal skin tags CPT copyright 2019 American Medical Association. All rights reserved. The codes documented in this report are preliminary and upon coder review may  be revised to meet current compliance requirements. Hildred Laser, MD Hildred Laser, MD 02/20/2021 10:09:51 AM This report has been signed electronically. Number of Addenda: 0

## 2021-02-21 LAB — SURGICAL PATHOLOGY

## 2021-02-26 ENCOUNTER — Encounter (HOSPITAL_COMMUNITY): Payer: Self-pay | Admitting: Internal Medicine

## 2021-03-06 ENCOUNTER — Encounter (INDEPENDENT_AMBULATORY_CARE_PROVIDER_SITE_OTHER): Payer: Self-pay | Admitting: *Deleted

## 2021-03-07 ENCOUNTER — Other Ambulatory Visit: Payer: Self-pay

## 2021-03-07 ENCOUNTER — Ambulatory Visit
Admission: RE | Admit: 2021-03-07 | Discharge: 2021-03-07 | Disposition: A | Discharge status: Home / Self Care | Payer: 59 | Source: Ambulatory Visit | Attending: Radiation Oncology | Admitting: Radiation Oncology

## 2021-03-07 VITALS — BP 117/83 | HR 70 | Temp 97.5°F | Resp 18 | Ht 68.0 in | Wt 252.8 lb

## 2021-03-07 DIAGNOSIS — C090 Malignant neoplasm of tonsillar fossa: Secondary | ICD-10-CM | POA: Insufficient documentation

## 2021-03-07 DIAGNOSIS — R5383 Other fatigue: Secondary | ICD-10-CM | POA: Insufficient documentation

## 2021-03-07 DIAGNOSIS — Z79899 Other long term (current) drug therapy: Secondary | ICD-10-CM | POA: Diagnosis not present

## 2021-03-07 DIAGNOSIS — R682 Dry mouth, unspecified: Secondary | ICD-10-CM | POA: Diagnosis not present

## 2021-03-07 DIAGNOSIS — C760 Malignant neoplasm of head, face and neck: Secondary | ICD-10-CM

## 2021-03-07 NOTE — Progress Notes (Signed)
Mr. Hollerbach presents today for follow-up of radiation to his right tonsil completed on 11/30/2019   Pain issues, if any: Reports occasional discomfort at the back of his throat, but states it's manageable Using a feeding tube?: N/A--removed 06/10/2020 Weight changes, if any:  Wt Readings from Last 3 Encounters:  03/07/21 252 lb 12.8 oz (114.7 kg)  02/20/21 247 lb (112 kg)  12/31/20 247 lb 9.6 oz (112.3 kg)   Swallowing issues, if any: Yes--still struggles with drier foods like bread, but denies any issues with thin liquids. Smoking or chewing tobacco? None Using fluoride trays daily? N/A--sees community dentist every 4 months Last ENT visit was on: 01/16/2021 Saw Dr. [name]:  "--Physical Exam:  Healthy-appearing gentleman no distress. Breathing and voice are clear. Nasal exam unremarkable. Oral cavity and pharynx clear with surgical absence of the tonsils. Indirect exam of the larynx and pharynx is clear and symmetric. No lesions identified. Woody fibrosis of the right side of the neck but no distinct mass palpable on either side --Impression & Plans:  Stable, no evidence of recurrent disease. Recheck 3 months or sooner as needed"  Other notable issues, if any: Last saw medical oncologist Dr. Irene Limbo in 12/2020. Continued to deal with dry mouth and altered sense of taste. Reports facial spasms (from cheek, to lip and down neck) on right side. Reports he does his PT exercises at home (was told his progress had plateaued so he didn't need to continue with in person sessions). Continues to deal with fatigue and difficulty sleeping at night. Had colonoscopy on 02/20/2021. Had 3 areas removed from his left upper thigh this past Monday--awaiting pathology results (Dermatologist Dr. Nevada Crane)

## 2021-03-10 ENCOUNTER — Encounter: Payer: Self-pay | Admitting: Radiation Oncology

## 2021-03-10 NOTE — Progress Notes (Signed)
Radiation Oncology         [phone]) [phone] ________________________________  Name: Dustin Leiter Sr. MRN: [id number]  Date: 03/07/2021  DOB: [birth year]  Follow-Up Visit Note  CC: Claretta Fraise, MD  Claretta Fraise, MD  Diagnosis and Prior Radiotherapy:    C09.0   ICD-10-CM   1. Squamous cell carcinoma of head and neck (HCC)  C76.0     2. Malignant neoplasm of tonsillar fossa (HCC)  C09.0      Cancer Staging Malignant neoplasm of tonsillar fossa (HCC) Staging form: Pharynx - HPV-Mediated Oropharynx, AJCC 8th Edition - Pathologic stage from 10/10/2019: Stage I (pT1, pN1, cM0, p16+) - Signed by Eppie Gibson, MD on 10/11/2019 Stage prefix: Initial diagnosis  Squamous cell carcinoma of head and neck ([place]) Staging form: Pharynx - HPV-Mediated Oropharynx, AJCC 8th Edition - Clinical stage from 08/11/2019: Stage I (cT0, cN1, cM0, p16+) - Signed by Eppie Gibson, MD on 08/12/2019 Stage prefix: Initial diagnosis   Radiation Treatment Dates: 10/19/2019 through 11/30/2019 Site Technique Total Dose (Gy) Dose per Fx (Gy) Completed Fx Beam Energies  Tonsil, Right: HN_Rt_tonsil IMRT 60/60 2 30/30 6X   CHIEF COMPLAINT: Tonsil cancer  Narrative:     Mr. Allerton presents today for follow-up of radiation to his right tonsil completed on 11/30/2019   Pain issues, if any: Reports occasional discomfort at the back of his throat, but states it's manageable Using a feeding tube?: N/A--removed 06/10/2020 Weight changes, if any:  Wt Readings from Last 3 Encounters:  03/07/21 252 lb 12.8 oz (114.7 kg)  02/20/21 247 lb (112 kg)  12/31/20 247 lb 9.6 oz (112.3 kg)   Swallowing issues, if any: Yes--still struggles with drier foods like bread, but denies any issues with thin liquids. Smoking or chewing tobacco? None Using fluoride trays daily? N/A--sees community dentist every 4 months Last ENT visit was on: 01/16/2021 Saw Dr. [name]:  "--Physical Exam:  Healthy-appearing gentleman no distress. Breathing  and voice are clear. Nasal exam unremarkable. Oral cavity and pharynx clear with surgical absence of the tonsils. Indirect exam of the larynx and pharynx is clear and symmetric. No lesions identified. Woody fibrosis of the right side of the neck but no distinct mass palpable on either side --Impression & Plans:  Stable, no evidence of recurrent disease. Recheck 3 months or sooner as needed"  Other notable issues, if any: Last saw medical oncologist Dr. Irene Limbo in 12/2020. Continued to deal with dry mouth and altered sense of taste. Reports facial spasms (from cheek, to lip and down neck) on right side. Reports he does his PT exercises at home (was told his progress had plateaued so he didn't need to continue with in person sessions). Continues to deal with fatigue and difficulty sleeping at night. Had colonoscopy on 02/20/2021. Had 3 areas removed from his left upper thigh this past Monday--awaiting pathology results (Dermatologist Dr. Nevada Crane)   ALLERGIES:  has No Known Allergies.  Meds: Current Outpatient Medications  Medication Sig Dispense Refill   Colchicine (MITIGARE) 0.6 MG CAPS Take every two hours at onset of gout attack until pain resolves or 6 have been taken. Then decrease to two daily until flare ends (Patient taking differently: Take 0.6 mg by mouth daily as needed (gout). Take every two hours at onset of gout attack until pain resolves or 6 have been taken. Then decrease to two daily until flare ends) 30 capsule 11   fluticasone (FLONASE) 50 MCG/ACT nasal spray Place 2 sprays into both nostrils daily as needed for  allergies.     ibuprofen (ADVIL) 200 MG tablet Take 600 mg by mouth every 8 (eight) hours as needed for fever, headache or mild pain.     ondansetron (ZOFRAN) 4 MG tablet Take 1 tablet (4 mg total) by mouth every 8 (eight) hours as needed. (Patient not taking: No sig reported) 30 tablet 1   polyethylene glycol-electrolytes (TRILYTE) 420 g solution Take 4,000 mLs by mouth as  directed. 4000 mL 0   No current facility-administered medications for this encounter.   Facility-Administered Medications Ordered in Other Encounters  Medication Dose Route Frequency Provider Last Rate Last Admin   0.9 %  sodium chloride infusion   Intravenous Continuous Monia Sabal, PA-C        Physical Findings: The patient is in no acute distress. Patient is alert and oriented. Wt Readings from Last 3 Encounters:  03/07/21 252 lb 12.8 oz (114.7 kg)  02/20/21 247 lb (112 kg)  12/31/20 247 lb 9.6 oz (112.3 kg)    height is 5\' 8"  (1.727 m) and weight is 252 lb 12.8 oz (114.7 kg). His temperature is 97.5 F (36.4 C) (abnormal). His blood pressure is 117/83 and his pulse is 70. His respiration is 18 and oxygen saturation is 97%. .  General: Alert and oriented, in no acute distress HEENT: Head is normocephalic.  Oropharynx is notable for no lesions or thrush. Neck: Neck is notable for well approximated surgical scar, right neck.  No palpable masses. Psychiatric: Judgment and insight are intact. Affect is appropriate. Heart regular in rate and rhythm Chest clear to auscultation bilaterally  Lab Findings: Lab Results  Component Value Date   WBC 4.8 12/12/2020   HGB 14.9 12/12/2020   HCT 43.5 12/12/2020   MCV 88.1 12/12/2020   PLT 169 12/12/2020    Lab Results  Component Value Date   TSH 4.456 (H) 12/12/2020    Radiographic Findings: No results found.   Impression/Plan:  History of tonsil cancer, s/p adjuvant ChRT.  He is doing well symptomatically.   He does have some sporadic spasms that have been part of his "new normal" since surgery.  He has no evidence of disease.   He will continue to see Dr. Irene Limbo and Dr. Constance Holster in 3 months.  I will see him back as needed.  He knows to continue annual thyroid function testing and to see his dentist every 4 months.  We talked about how he is doing emotionally.  Overall he is managing well but being diagnosed and treated for head and  neck cancer has been traumatic.  I recommended that he check in with our spiritual care provider for an additional layer of emotional support.  He is enthusiastic about this and we will make a referral.   On date of service, in total, I spent 25 minutes on this encounter.  He was seen in person.   _____________________________________   Eppie Gibson, MD

## 2021-03-13 ENCOUNTER — Encounter: Payer: Self-pay | Admitting: General Practice

## 2021-03-13 NOTE — Progress Notes (Signed)
[facility] Spiritual Care Note  Referred by Dr Isidore Moos for spiritual and emotional support.  Dustin Rios was very welcoming of call and shared about his complicated treatment path. He values having someone to talk with outside of his social circles, noting that his insurance helpfully provided two different people, a counselor and a nurse, to help him process the period of his active treatment. Two sources of meaning and purpose that are still accessible to him (unlike work) are enjoying his grandchildren and, with limits, working outside in the yard.  Built rapport, providing reflective listening, normalization of feelings, and emotional support. Served as a witness to his story, including grief, healing, and hope.  We plan to follow up by phone in ca 2 weeks, and Dustin Rios has my direct-dial number in case needs arise in the meantime.   [place], [place], [facility] Pager [phone] Voicemail [phone]

## 2021-04-23 ENCOUNTER — Ambulatory Visit (HOSPITAL_COMMUNITY)
Admission: RE | Admit: 2021-04-23 | Discharge: 2021-04-23 | Disposition: A | Discharge status: Home / Self Care | Payer: 59 | Source: Ambulatory Visit | Attending: Hematology | Admitting: Hematology

## 2021-04-23 ENCOUNTER — Other Ambulatory Visit: Payer: Self-pay

## 2021-04-23 DIAGNOSIS — C099 Malignant neoplasm of tonsil, unspecified: Secondary | ICD-10-CM | POA: Insufficient documentation

## 2021-04-29 ENCOUNTER — Encounter: Payer: Self-pay | Admitting: Pulmonary Disease

## 2021-04-29 ENCOUNTER — Ambulatory Visit (INDEPENDENT_AMBULATORY_CARE_PROVIDER_SITE_OTHER): Payer: 59 | Admitting: Pulmonary Disease

## 2021-04-29 ENCOUNTER — Other Ambulatory Visit: Payer: Self-pay

## 2021-04-29 VITALS — BP 130/72 | HR 77 | Temp 98.0°F | Ht 68.0 in | Wt 252.0 lb

## 2021-04-29 DIAGNOSIS — G4733 Obstructive sleep apnea (adult) (pediatric): Secondary | ICD-10-CM | POA: Diagnosis not present

## 2021-04-29 NOTE — Patient Instructions (Signed)
X Home sleep test to reassess  X check report from your machine from [facility] & sleep studies from [employer]

## 2021-04-29 NOTE — Progress Notes (Signed)
Subjective:    Patient ID: Dustin Leiter Sr., male    DOB: 28-Nov-1965, 55 y.o.   MRN: [id number]  HPI  55 year old referred by ENT for evaluation of obstructive sleep apnea. OSA was diagnosed 18 to 20 years ago at [facility] and he has been maintained on CPAP since then.  He is on his third machine set at 13 cm, uses a full facemask since he is a mouth breather.  He also uses Flonase on a daily basis .he was evaluated by ENT and nasal septoplasty is being considered  after his recent treatment for tonsillar cancer, he transiently required PEG and his weight has dropped from a peak of 311 to current weight of 252 pounds  Epworth sleepiness score is 2 and he denies excessive somnolence and fatigue. Bedtime is between 10 PM and midnight, sleep latency is about 30 minutes, he sleeps on his back with 2 pillows and very often rolls over on his side, denies nocturnal awakenings and is out of bed by 7 AM feeling rested without dryness of mouth or headaches There is no history suggestive of cataplexy, sleep paralysis or parasomnias   PMh -tonsillar cancer status tonsillectomy/adenoidectomy, post neck dissection , chemo RT, last 10/2019  He quit drinking 5 years ago, used to chew tobacco. He is disabled, used to work rotating shifts at [employer]  Past Medical History:  Diagnosis Date   Arthritis    Change in stool 02/22/2017   Family history of cancer    Family history of uterine cancer    Gout 02/22/2017   Hyperlipidemia    Sleep apnea    Uses CPAP   Tonsil cancer ([place]) DX'd 07/2019   metastatic    .psh  No Known Allergies  Social History   Socioeconomic History   Marital status: Married    Spouse name: Not on file   Number of children: 1   Years of education: Not on file   Highest education level: Not on file  Occupational History   Occupation: electrician  Tobacco Use   Smoking status: Never   Smokeless tobacco: Never  Vaping Use   Vaping Use: Never used  Substance and  Sexual Activity   Alcohol use: No   Drug use: No   Sexual activity: Not on file  Other Topics Concern   Not on file  Social History Narrative   Patient is married and has 1 son.   Patient has never smoked, used tobacco products.   Patient does not drink.  Patient does not use illicit drugs.   Social Determinants of Health   Financial Resource Strain: Not on file  Food Insecurity: Not on file  Transportation Needs: Not on file  Physical Activity: Not on file  Stress: Not on file  Social Connections: Not on file  Intimate Partner Violence: Not on file    Family History  Problem Relation Age of Onset   COPD Mother    Uterine cancer Mother 76   Lung cancer Brother 77       smoker   Lung cancer Maternal Grandfather    Stomach cancer Paternal Grandmother        d. 20s   Cervical cancer Maternal Aunt    Lung cancer Maternal Grandmother        smoker   Prostate cancer Paternal Grandfather    Pancreatic cancer Cousin 12       mother's maternal first cousin   Stomach cancer Cousin 38  mother's maternal first cousin   Cancer Cousin        mother's maternal first cousin with NOS cancer   Cancer Other        MGMs sister with cancer NOS   Lung cancer Other        smoker   Cancer Other        mother's maternal grandfather with NOS cancer     Review of Systems Nasal congestion Difficulty swallowing Weight loss   Constitutional: negative for anorexia, fevers and sweats  Eyes: negative for irritation, redness and visual disturbance  Ears, nose, mouth, throat, and face: negative for earaches, epistaxis and sore throat  Respiratory: negative for cough, dyspnea on exertion, sputum and wheezing  Cardiovascular: negative for chest pain, dyspnea, lower extremity edema, orthopnea, palpitations and syncope  Gastrointestinal: negative for abdominal pain, constipation, diarrhea, melena, nausea and vomiting  Genitourinary:negative for dysuria, frequency and hematuria   Hematologic/lymphatic: negative for bleeding, easy bruising and lymphadenopathy  Musculoskeletal:negative for arthralgias, muscle weakness and stiff joints  Neurological: negative for coordination problems, gait problems, headaches and weakness  Endocrine: negative for diabetic symptoms including polydipsia, polyuria and weight loss     Objective:   Physical Exam  Gen. Pleasant, obese, in no distress, normal affect ENT - no pallor,icterus, no post nasal drip, rt neck scar,class 2-3 airway Neck: No JVD, no thyromegaly, no carotid bruits Lungs: no use of accessory muscles, no dullness to percussion, decreased without rales or rhonchi  Cardiovascular: Rhythm regular, heart sounds  normal, no murmurs or gallops, no peripheral edema Abdomen: soft and non-tender, no hepatosplenomegaly, BS normal. Musculoskeletal: No deformities, no cyanosis or clubbing Neuro:  alert, non focal, no tremors       Assessment & Plan:

## 2021-04-29 NOTE — Assessment & Plan Note (Signed)
He has lost significant weight and it is possible that his sleep apnea has improved and/or his CPAP pressure requirements has changed The correlation of weight and OSA was discussed

## 2021-04-29 NOTE — Assessment & Plan Note (Signed)
We will try to obtain his sleep studies from overnight hospital and CPAP download from [facility]. We will also obtain a new home sleep study to reassess whether weight loss has improved his degree of sleep disordered breathing Based on this we will try to provide him with a new CPAP machine set at 13 cm.  Alternatively, we can provide him with an auto CPAP 10 to 15 cm and then tweak settings based on download  Weight loss encouraged, compliance with goal of at least 4-6 hrs every night is the expectation. Advised against medications with sedative side effects Cautioned against driving when sleepy - understanding that sleepiness will vary on a day to day basis

## 2021-05-07 ENCOUNTER — Ambulatory Visit (INDEPENDENT_AMBULATORY_CARE_PROVIDER_SITE_OTHER): Payer: 59 | Admitting: Urology

## 2021-05-07 ENCOUNTER — Encounter: Payer: Self-pay | Admitting: Urology

## 2021-05-07 ENCOUNTER — Ambulatory Visit (HOSPITAL_COMMUNITY)
Admission: RE | Admit: 2021-05-07 | Discharge: 2021-05-07 | Disposition: A | Discharge status: Home / Self Care | Payer: 59 | Source: Ambulatory Visit | Attending: Urology | Admitting: Urology

## 2021-05-07 ENCOUNTER — Other Ambulatory Visit: Payer: Self-pay

## 2021-05-07 DIAGNOSIS — N200 Calculus of kidney: Secondary | ICD-10-CM | POA: Diagnosis not present

## 2021-05-07 DIAGNOSIS — R319 Hematuria, unspecified: Secondary | ICD-10-CM

## 2021-05-07 LAB — URINALYSIS, ROUTINE W REFLEX MICROSCOPIC
Bilirubin, UA: NEGATIVE
Glucose, UA: NEGATIVE
Ketones, UA: NEGATIVE
Leukocytes,UA: NEGATIVE
Nitrite, UA: NEGATIVE
Protein,UA: NEGATIVE
RBC, UA: NEGATIVE
Specific Gravity, UA: 1.025 (ref 1.005–1.030)
Urobilinogen, Ur: 0.2 mg/dL (ref 0.2–1.0)
pH, UA: 6 (ref 5.0–7.5)

## 2021-05-07 NOTE — Progress Notes (Signed)
05/07/2021 4:25 PM   Dustin Leiter Sr. [birth year] [id number]  Referring provider: Claretta Fraise, MD [place],  [place] [id number]  Followup nephrolithiasis   HPI: Dustin Rios is a 55yo here for followup for nephrolithiasis. No stone events since last visit. He denies any flank pain. KUB from today shows no calculi. He denies any significant LUTS.    PMH: Past Medical History:  Diagnosis Date   Arthritis    Change in stool 02/22/2017   Family history of cancer    Family history of uterine cancer    Gout 02/22/2017   Hyperlipidemia    Sleep apnea    Uses CPAP   Tonsil cancer ([place]) DX'd 07/2019   metastatic    Surgical History: Past Surgical History:  Procedure Laterality Date   CARPAL TUNNEL RELEASE Right 04/10/2019   CHOLECYSTECTOMY N/A 09/09/2020   Procedure: LAPAROSCOPIC CHOLECYSTECTOMY;  Surgeon: [name], MD;  Location: [facility];  Service: General;  Laterality: N/A;   COLONOSCOPY N/A 02/20/2021   Procedure: COLONOSCOPY;  Surgeon: Rogene Houston, MD;  [facility];  Service: Endoscopy;  Laterality: N/A;  9:30   DIRECT LARYNGOSCOPY N/A 08/21/2019   Procedure: DIRECT LARYNGOSCOPY - WITH BX;  Surgeon: [name], MD;  Location: [facility];  Service: ENT;  Laterality: N/A;   IR GASTROSTOMY TUBE MOD SED  10/13/2019   IR GASTROSTOMY TUBE REMOVAL  06/10/2020   IR IMAGING GUIDED PORT INSERTION  10/13/2019   IR REMOVAL TUN ACCESS W/ PORT W/O FL MOD SED  06/10/2020   NECK DISSECTION  09/11/2019   POLYPECTOMY  02/20/2021   Procedure: POLYPECTOMY;  Surgeon: Rogene Houston, MD;  [facility];  Service: Endoscopy;;   RADICAL NECK DISSECTION Right 09/11/2019   Procedure: RADICAL NECK DISSECTION;  Surgeon: [name], MD;  Location: [facility];  Service: ENT;  Laterality: Right;   removal of right jugular vein in neck     TONSILLECTOMY Bilateral 08/21/2019   Procedure: TONSILLECTOMY;  Surgeon: [name], MD;  Location: [place] [facility];  Service: ENT;  Laterality: Bilateral;   UMBILICAL HERNIA REPAIR  09/09/2020   Procedure: REPAIR UMBILICAL HERNIA;  Surgeon: [name], MD;  Location: [facility];  Service: General;;   WISDOM TOOTH EXTRACTION      Home Medications:  Allergies as of 05/07/2021   No Known Allergies      Medication List        Accurate as of May 07, 2021  4:25 PM. If you have any questions, ask your nurse or doctor.          STOP taking these medications    ondansetron 4 MG tablet Commonly known as: Zofran Stopped by: Nicolette Bang, MD       TAKE these medications    Colchicine 0.6 MG Caps Commonly known as: Mitigare Take every two hours at onset of gout attack until pain resolves or 6 have been taken. Then decrease to two daily until flare ends What changed:  how much to take how to take this when to take this reasons to take this   fluticasone 50 MCG/ACT nasal spray Commonly known as: FLONASE Place 2 sprays into both nostrils daily as needed for allergies.   fluticasone 50 MCG/ACT nasal spray Commonly known as: FLONASE Place into the nose.   ibuprofen 200 MG tablet Commonly known as: ADVIL Take 600 mg by mouth every 8 (eight) hours as needed for fever, headache or  mild pain.   polyethylene glycol-electrolytes 420 g solution Commonly known as: TriLyte Take 4,000 mLs by mouth as directed.        Allergies: No Known Allergies  Family History: Family History  Problem Relation Age of Onset   COPD Mother    Uterine cancer Mother 9   Lung cancer Brother 27       smoker   Lung cancer Maternal Grandfather    Stomach cancer Paternal Grandmother        d. 14s   Cervical cancer Maternal Aunt    Lung cancer Maternal Grandmother        smoker   Prostate cancer Paternal Grandfather    Pancreatic cancer Cousin 76       mother's maternal first cousin   Stomach cancer Cousin 42       mother's maternal first cousin   Cancer Cousin         mother's maternal first cousin with NOS cancer   Cancer Other        MGMs sister with cancer NOS   Lung cancer Other        smoker   Cancer Other        mother's maternal grandfather with NOS cancer    Social History:  reports that he has never smoked. He has never used smokeless tobacco. He reports that he does not drink alcohol and does not use drugs.  ROS: All other review of systems were reviewed and are negative except what is noted above in HPI  Physical Exam: There were no vitals taken for this visit.  Constitutional:  Alert and oriented, No acute distress. HEENT: [place] AT, moist mucus membranes.  Trachea midline, no masses. Cardiovascular: No clubbing, cyanosis, or edema. Respiratory: Normal respiratory effort, no increased work of breathing. GI: Abdomen is soft, nontender, nondistended, no abdominal masses GU: No CVA tenderness.  Lymph: No cervical or inguinal lymphadenopathy. Skin: No rashes, bruises or suspicious lesions. Neurologic: Grossly intact, no focal deficits, moving all 4 extremities. Psychiatric: Normal mood and affect.  Laboratory Data: Lab Results  Component Value Date   WBC 4.8 12/12/2020   HGB 14.9 12/12/2020   HCT 43.5 12/12/2020   MCV 88.1 12/12/2020   PLT 169 12/12/2020    Lab Results  Component Value Date   CREATININE 1.23 12/12/2020    No results found for: PSA  No results found for: TESTOSTERONE  Lab Results  Component Value Date   HGBA1C 5.7 04/03/2019    Urinalysis    Component Value Date/Time   COLORURINE YELLOW 08/08/2020 0133   APPEARANCEUR Clear 10/30/2020 1517   LABSPEC 1.019 08/08/2020 0133   PHURINE 6.0 08/08/2020 0133   GLUCOSEU Negative 10/30/2020 1517   HGBUR NEGATIVE 08/08/2020 0133   BILIRUBINUR Negative 10/30/2020 1517   KETONESUR NEGATIVE 08/08/2020 0133   PROTEINUR Negative 10/30/2020 1517   PROTEINUR NEGATIVE 08/08/2020 0133   NITRITE Negative 10/30/2020 1517   NITRITE NEGATIVE 08/08/2020 0133    LEUKOCYTESUR Negative 10/30/2020 1517   LEUKOCYTESUR NEGATIVE 08/08/2020 0133    Lab Results  Component Value Date   LABMICR Comment 10/30/2020   BACTERIA RARE (A) 11/03/2019    Pertinent Imaging: KUB today: Images reviewed and discussed with the patient  No results found for this or any previous visit.  No results found for this or any previous visit.  No results found for this or any previous visit.  No results found for this or any previous visit.  Results for orders placed during  the hospital encounter of 10/16/20  Ultrasound renal complete  Narrative CLINICAL DATA:  Nephrolithiasis follow-up  EXAM: RENAL / URINARY TRACT ULTRASOUND COMPLETE  COMPARISON:  CT the abdomen pelvis September 2021.  FINDINGS: Right Kidney:  Renal measurements: 9.9 x 5.9 x 5.7 cm = volume: 1721 mL. There is a shadowing 13 5 mm stone in the upper pole of the right kidney. Other small echogenic foci could represent tiny stones.  Left Kidney:  Renal measurements: 11.3 x 6.2 x 5.5 cm = volume: 200.5 mL. Multiple small echogenic foci identified in the left kidney.  Bladder:  Appears normal for degree of bladder distention.  Other:  None.  IMPRESSION: There appears to be a 13.5 mm shadowing stone in the upper right kidney. Other small stones scattered throughout both kidneys suspected.   Electronically Signed By: Dorise Bullion III M.D On: 10/18/2020 21:21  No results found for this or any previous visit.  No results found for this or any previous visit.  No results found for this or any previous visit.   Assessment & Plan:    1. Nephrolithiasis -RTC 1 year with KUB - Abdomen 1 view (KUB); Future   Return in about 1 year (around 05/07/2022) for KUB.  Nicolette Bang, MD  [facility] [place]

## 2021-05-07 NOTE — Patient Instructions (Signed)
Dietary Guidelines to Help Prevent Kidney Stones Kidney stones are deposits of minerals and salts that form inside your kidneys. Your risk of developing kidney stones may be greater depending on your diet, your lifestyle, the medicines you take, and whether you have certain medical conditions. Most people can lower their chances of developing kidney stones by following the instructions below. Your dietitian may give you more specific instructions depending on your overall health and the type of kidney stones you tend to develop. What are tips for following this plan? Reading food labels  Choose foods with "no salt added" or "low-salt" labels. Limit your salt (sodium) intake to less than 1,500 mg a day. Choose foods with calcium for each meal and snack. Try to eat about 300 mg of calcium at each meal. Foods that contain 200-500 mg of calcium a serving include: 8 oz (237 mL) of milk, calcium-fortifiednon-dairy milk, and calcium-fortifiedfruit juice. Calcium-fortified means that calcium has been added to these drinks. 8 oz (237 mL) of kefir, yogurt, and soy yogurt. 4 oz (114 g) of tofu. 1 oz (28 g) of cheese. 1 cup (150 g) of dried figs. 1 cup (91 g) of cooked broccoli. One 3 oz (85 g) can of sardines or mackerel. Most people need 1,000-1,500 mg of calcium a day. Talk to your dietitian about how much calcium is recommended for you. Shopping Buy plenty of fresh fruits and vegetables. Most people do not need to avoid fruits and vegetables, even if these foods contain nutrients that may contribute to kidney stones. When shopping for convenience foods, choose: Whole pieces of fruit. Pre-made salads with dressing on the side. Low-fat fruit and yogurt smoothies. Avoid buying frozen meals or prepared deli foods. These can be high in sodium. Look for foods with live cultures, such as yogurt and kefir. Choose high-fiber grains, such as whole-wheat breads, oat bran, and wheat cereals. Cooking Do not add  salt to food when cooking. Place a salt shaker on the table and allow each person to add his or her own salt to taste. Use vegetable protein, such as beans, textured vegetable protein (TVP), or tofu, instead of meat in pasta, casseroles, and soups. Meal planning Eat less salt, if told by your dietitian. To do this: Avoid eating processed or pre-made food. Avoid eating fast food. Eat less animal protein, including cheese, meat, poultry, or fish, if told by your dietitian. To do this: Limit the number of times you have meat, poultry, fish, or cheese each week. Eat a diet free of meat at least 2 days a week. Eat only one serving each day of meat, poultry, fish, or seafood. When you prepare animal protein, cut pieces into small portion sizes. For most meat and fish, one serving is about the size of the palm of your hand. Eat at least five servings of fresh fruits and vegetables each day. To do this: Keep fruits and vegetables on hand for snacks. Eat one piece of fruit or a handful of berries with breakfast. Have a salad and fruit at lunch. Have two kinds of vegetables at dinner. Limit foods that are high in a substance called oxalate. These include: Spinach (cooked), rhubarb, beets, sweet potatoes, and Swiss chard. Peanuts. Potato chips, french fries, and baked potatoes with skin on. Nuts and nut products. Chocolate. If you regularly take a diuretic medicine, make sure to eat at least 1 or 2 servings of fruits or vegetables that are high in potassium each day. These include: Avocado. Banana. Orange, prune,   carrot, or tomato juice. Baked potato. Cabbage. Beans and split peas. Lifestyle  Drink enough fluid to keep your urine pale yellow. This is the most important thing you can do. Spread your fluid intake throughout the day. If you drink alcohol: Limit how much you use to: 0-1 drink a day for women who are not pregnant. 0-2 drinks a day for men. Be aware of how much alcohol is in your  drink. In the [place] one drink equals one 12 oz bottle of beer (355 mL), one 5 oz glass of wine (148 mL), or one 1 oz glass of hard liquor (44 mL). Lose weight if told by your health care provider. Work with your dietitian to find an eating plan and weight loss strategies that work best for you. General information Talk to your health care provider and dietitian about taking daily supplements. You may be told the following depending on your health and the cause of your kidney stones: Not to take supplements with vitamin C. To take a calcium supplement. To take a daily probiotic supplement. To take other supplements such as magnesium, fish oil, or vitamin B6. Take over-the-counter and prescription medicines only as told by your health care provider. These include supplements. What foods should I limit? Limit your intake of the following foods, or eat them as told by your dietitian. Vegetables Spinach. Rhubarb. Beets. Canned vegetables. Pickles. Olives. Baked potatoes with skin. Grains Wheat bran. Baked goods. Salted crackers. Cereals high in sugar. Meats and other proteins Nuts. Nut butters. Large portions of meat, poultry, or fish. Salted, precooked, or cured meats, such as sausages, meat loaves, and hot dogs. Dairy Cheese. Beverages Regular soft drinks. Regular vegetable juice. Seasonings and condiments Seasoning blends with salt. Salad dressings. Soy sauce. Ketchup. Barbecue sauce. Other foods Canned soups. Canned pasta sauce. Casseroles. Pizza. Lasagna. Frozen meals. Potato chips. French fries. The items listed above may not be a complete list of foods and beverages you should limit. Contact a dietitian for more information. What foods should I avoid? Talk to your dietitian about specific foods you should avoid based on the type of kidney stones you have and your overall health. Fruits Grapefruit. The item listed above may not be a complete list of foods and beverages you should  avoid. Contact a dietitian for more information. Summary Kidney stones are deposits of minerals and salts that form inside your kidneys. You can lower your risk of kidney stones by making changes to your diet. The most important thing you can do is drink enough fluid. Drink enough fluid to keep your urine pale yellow. Talk to your dietitian about how much calcium you should have each day, and eat less salt and animal protein as told by your dietitian. This information is not intended to replace advice given to you by your health care provider. Make sure you discuss any questions you have with your health care provider. Document Revised: 04/20/2019 Document Reviewed: 04/20/2019 Elsevier Patient Education  2022 Elsevier Inc.  

## 2021-05-07 NOTE — Progress Notes (Signed)
Urological Symptom Review  Patient is experiencing the following symptoms: Kidney stones   Review of Systems  Gastrointestinal (upper)  : Negative for upper GI symptoms  Gastrointestinal (lower) : Negative for lower GI symptoms  Constitutional : Fatigue  Skin: Negative for skin symptoms  Eyes: Negative for eye symptoms  Ear/Nose/Throat : Sinus problems  Hematologic/Lymphatic: Negative for Hematologic/Lymphatic symptoms  Cardiovascular : Negative for cardiovascular symptoms  Respiratory : Negative for respiratory symptoms  Endocrine: Negative for endocrine symptoms  Musculoskeletal: Negative for musculoskeletal symptoms  Neurological: Negative for neurological symptoms  Psychologic: Negative for psychiatric symptoms

## 2021-05-09 NOTE — Progress Notes (Signed)
Normal

## 2021-05-15 ENCOUNTER — Inpatient Hospital Stay: Payer: 59 | Attending: Hematology

## 2021-05-15 ENCOUNTER — Inpatient Hospital Stay: Payer: 59 | Admitting: Hematology

## 2021-05-15 ENCOUNTER — Other Ambulatory Visit: Payer: Self-pay

## 2021-05-15 VITALS — BP 132/81 | HR 81 | Temp 97.7°F | Resp 20 | Wt 274.2 lb

## 2021-05-15 DIAGNOSIS — E785 Hyperlipidemia, unspecified: Secondary | ICD-10-CM | POA: Insufficient documentation

## 2021-05-15 DIAGNOSIS — G4730 Sleep apnea, unspecified: Secondary | ICD-10-CM | POA: Diagnosis not present

## 2021-05-15 DIAGNOSIS — C099 Malignant neoplasm of tonsil, unspecified: Secondary | ICD-10-CM

## 2021-05-15 DIAGNOSIS — M109 Gout, unspecified: Secondary | ICD-10-CM | POA: Diagnosis not present

## 2021-05-15 DIAGNOSIS — Z801 Family history of malignant neoplasm of trachea, bronchus and lung: Secondary | ICD-10-CM | POA: Insufficient documentation

## 2021-05-15 DIAGNOSIS — R7989 Other specified abnormal findings of blood chemistry: Secondary | ICD-10-CM | POA: Diagnosis not present

## 2021-05-15 DIAGNOSIS — Z800 Family history of malignant neoplasm of digestive organs: Secondary | ICD-10-CM | POA: Insufficient documentation

## 2021-05-15 DIAGNOSIS — E039 Hypothyroidism, unspecified: Secondary | ICD-10-CM | POA: Insufficient documentation

## 2021-05-15 LAB — CMP (CANCER CENTER ONLY)
ALT: 18 U/L (ref 0–44)
AST: 17 U/L (ref 15–41)
Albumin: 4 g/dL (ref 3.5–5.0)
Alkaline Phosphatase: 63 U/L (ref 38–126)
Anion gap: 5 (ref 5–15)
BUN: 20 mg/dL (ref 6–20)
CO2: 30 mmol/L (ref 22–32)
Calcium: 8.9 mg/dL (ref 8.9–10.3)
Chloride: 103 mmol/L (ref 98–111)
Creatinine: 1.34 mg/dL — ABNORMAL HIGH (ref 0.61–1.24)
GFR, Estimated: >60 mL/min (ref >60)
Glucose, Bld: 108 mg/dL — ABNORMAL HIGH (ref 70–99)
Potassium: 4.2 mmol/L (ref 3.5–5.1)
Sodium: 138 mmol/L (ref 135–145)
Total Bilirubin: 0.4 mg/dL (ref 0.3–1.2)
Total Protein: 6.9 g/dL (ref 6.5–8.1)

## 2021-05-15 LAB — CBC WITH DIFFERENTIAL/PLATELET
Abs Immature Granulocytes: 0.01 10*3/uL (ref 0.00–0.07)
Basophils Absolute: 0 10*3/uL (ref 0.0–0.1)
Basophils Relative: 0 %
Eosinophils Absolute: 0.1 10*3/uL (ref 0.0–0.5)
Eosinophils Relative: 3 %
HCT: 45.8 % (ref 39.0–52.0)
Hemoglobin: 15.6 g/dL (ref 13.0–17.0)
Immature Granulocytes: 0 %
Lymphocytes Relative: 20 %
Lymphs Abs: 1.1 10*3/uL (ref 0.7–4.0)
MCH: 29.9 pg (ref 26.0–34.0)
MCHC: 34.1 g/dL (ref 30.0–36.0)
MCV: 87.7 fL (ref 80.0–100.0)
Monocytes Absolute: 0.3 10*3/uL (ref 0.1–1.0)
Monocytes Relative: 6 %
Neutro Abs: 3.9 10*3/uL (ref 1.7–7.7)
Neutrophils Relative %: 71 %
Platelets: 178 10*3/uL (ref 150–400)
RBC: 5.22 MIL/uL (ref 4.22–5.81)
RDW: 12.8 % (ref 11.5–15.5)
WBC: 5.6 10*3/uL (ref 4.0–10.5)
nRBC: 0 % (ref 0.0–0.2)

## 2021-05-15 LAB — TSH: TSH: 5.67 u[IU]/mL — ABNORMAL HIGH (ref 0.320–4.118)

## 2021-05-16 ENCOUNTER — Telehealth: Payer: Self-pay | Admitting: Hematology

## 2021-05-16 NOTE — Telephone Encounter (Signed)
Scheduled follow-up appointment per 1/5 los. Patient is aware. °

## 2021-05-21 ENCOUNTER — Encounter: Payer: Self-pay | Admitting: Hematology

## 2021-05-21 NOTE — Progress Notes (Addendum)
HEMATOLOGY/ONCOLOGY CLINIC NOTE  Date of Service: .05/15/2021   Patient Care Team: Claretta Fraise, MD as PCP - General (Family Medicine) Jodi Marble, MD as Consulting Physician (Otolaryngology) Eppie Gibson, MD as Attending Physician (Radiation Oncology) Tish Men, MD (Inactive) as Consulting Physician (Hematology) Malmfelt, Stephani Police, RN as Oncology Nurse Navigator (Oncology)  REFERRING PHYSICIAN: Claretta Fraise, MD  CHIEF COMPLAINTS/PURPOSE OF CONSULTATION:  Follow-up for continued surveillance of squamous cell carcinoma of the head and neck  HISTORY OF PRESENTING ILLNESS:   Please see previous note for details on initial presentation Oncologic history   . Oncology History  Squamous cell carcinoma of head and neck (HCC)  07/13/2019 Imaging   CT neck: IMPRESSION: 4.1 x 3.2 cm necrotic/cystic node or nodal conglomerate at the right level II station likely reflecting nodal metastatic disease. No definite primary mass is identified within the oral cavity, pharynx or larynx. Correlate with direct visualization and consider direct tissue sampling. Additionally, PET-CT may be helpful.   Cervical spondylosis as described. A prominent C6-C7 posterior disc osteophyte contributes to at least moderate bony spinal canal stenosis. Multilevel neural foraminal narrowing.   07/26/2019 Pathology Results   FINAL MICROSCOPIC DIAGNOSIS:   A. RIGHT CERVICAL LYMPH NODE, NEEDLE CORE BIOPSY:  - Squamous cell carcinoma, basaloid.  - No distinct nodal tissue identified.   P16 immunohistochemistry is POSITIVE.    08/10/2019 Initial Diagnosis   Squamous cell carcinoma of head and neck ([place])   08/10/2019 Imaging   PET:  IMPRESSION: 1. The 2.8 cm right level IIa lymph node has a maximum SUV of 28.4. A primary lesion is not readily seen. There is some asymmetric palatine tonsillar activity with the left tonsil having maximum SUV of 12.4 and the right 7.8, but no tonsillar mass is  observed on the CT data. 2. No compelling findings of active malignancy in the chest, abdomen/pelvis, or skeleton. There is some accentuated anal activity which is likely physiologic given the lack of visible abnormality on the CT data. 3. Other imaging findings of potential clinical significance: Diffuse hepatic steatosis. Hypodense left renal lesion is probably a cyst but technically too small to characterize.   08/11/2019 Cancer Staging   Staging form: Pharynx - HPV-Mediated Oropharynx, AJCC 8th Edition - Clinical stage from 08/11/2019: Stage I (cT0, cN1, cM0, p16+) - Signed by Eppie Gibson, MD on 08/12/2019    Squamous cell carcinoma of right tonsil ([place]) (Resolved)  09/04/2019 Initial Diagnosis   Squamous cell carcinoma of right tonsil ([place])   10/10/2019 Cancer Staging   Staging form: Pharynx - HPV-Mediated Oropharynx, AJCC 8th Edition - Pathologic stage from 10/10/2019: Stage I (pT1, pN1, cM0, p16+) - Signed by Eppie Gibson, MD on 10/11/2019    Malignant neoplasm of tonsillar fossa ([place])  09/11/2019 Initial Diagnosis   Malignant neoplasm of tonsillar fossa ([place])   10/10/2019 Cancer Staging   Staging form: Pharynx - HPV-Mediated Oropharynx, AJCC 8th Edition - Pathologic stage from 10/10/2019: Stage I (pT1, pN1, cM0, p16+) - Signed by Eppie Gibson, MD on 10/11/2019    10/20/2019 -  Chemotherapy   The patient had dexamethasone (DECADRON) 4 MG tablet, 1 of 1 cycle, Start date: --, End date: -- palonosetron (ALOXI) injection 0.25 mg, 0.25 mg, Intravenous,  Once, 5 of 7 cycles Administration: 0.25 mg (10/20/2019), 0.25 mg (10/26/2019), 0.25 mg (11/02/2019), 0.25 mg (11/10/2019) CISplatin (PLATINOL) 100 mg in sodium chloride 0.9 % 500 mL chemo infusion, 39.3 mg/m2 = 102 mg, Intravenous,  Once, 5 of 7 cycles Administration: 100 mg (  10/20/2019), 100 mg (10/26/2019), 100 mg (11/02/2019), 100 mg (11/10/2019) fosaprepitant (EMEND) 150 mg in sodium chloride 0.9 % 145 mL IVPB, 150 mg, Intravenous,  Once, 5 of 7  cycles Administration: 150 mg (10/20/2019), 150 mg (10/26/2019), 150 mg (11/02/2019), 150 mg (11/10/2019)   for chemotherapy treatment.         INTERVAL HISTORY:  Dustin Maye Sr. is here for continued evaluation and management of his head and neck squamous cell carcinoma. He is now about 18 months out from completing his treatment. He notes that his energy levels are improving and he is becoming more physically active. Still has some decreased range of movement over his right shoulder due to muscle weakness. Continues to follow with Dr. Constance Holster and has had no concerns with evidence of local recurrence of the squamous cell carcinoma. Still having chronic dry mouth but no new dysphagia. Good p.o. intake with stable weight.  In fact he has gained about 25 pounds since August last year. He had a good holiday season.  Labs done today 05/15/2021 showed normal CBC, stable CMP with creatinine of 1.34.  TSH has crept up to 5.67.  CT chest without contrast 04/23/2021 shows no evidence of metastatic disease to the chest. Patient notes no other acute new focal symptoms.   MEDICAL HISTORY:  Past Medical History:  Diagnosis Date   Arthritis    Change in stool 02/22/2017   Family history of cancer    Family history of uterine cancer    Gout 02/22/2017   Hyperlipidemia    Sleep apnea    Uses CPAP   Tonsil cancer ([place]) DX'd 07/2019   metastatic     SURGICAL HISTORY: Past Surgical History:  Procedure Laterality Date   CARPAL TUNNEL RELEASE Right 04/10/2019   CHOLECYSTECTOMY N/A 09/09/2020   Procedure: LAPAROSCOPIC CHOLECYSTECTOMY;  Surgeon: [name], MD;  Location: [facility];  Service: General;  Laterality: N/A;   COLONOSCOPY N/A 02/20/2021   Procedure: COLONOSCOPY;  Surgeon: Rogene Houston, MD;  [facility];  Service: Endoscopy;  Laterality: N/A;  9:30   DIRECT LARYNGOSCOPY N/A 08/21/2019   Procedure: DIRECT LARYNGOSCOPY - WITH BX;  Surgeon: [name], MD;  Location:  [facility];  Service: ENT;  Laterality: N/A;   IR GASTROSTOMY TUBE MOD SED  10/13/2019   IR GASTROSTOMY TUBE REMOVAL  06/10/2020   IR IMAGING GUIDED PORT INSERTION  10/13/2019   IR REMOVAL TUN ACCESS W/ PORT W/O FL MOD SED  06/10/2020   NECK DISSECTION  09/11/2019   POLYPECTOMY  02/20/2021   Procedure: POLYPECTOMY;  Surgeon: Rogene Houston, MD;  [facility]: [facility];  Service: Endoscopy;;   RADICAL NECK DISSECTION Right 09/11/2019   Procedure: RADICAL NECK DISSECTION;  Surgeon: [name], MD;  Location: [facility];  Service: ENT;  Laterality: Right;   removal of right jugular vein in neck     TONSILLECTOMY Bilateral 08/21/2019   Procedure: TONSILLECTOMY;  Surgeon: [name], MD;  Location: [facility];  Service: ENT;  Laterality: Bilateral;   UMBILICAL HERNIA REPAIR  09/09/2020   Procedure: REPAIR UMBILICAL HERNIA;  Surgeon: [name], MD;  Location: [facility];  Service: General;;   WISDOM TOOTH EXTRACTION       SOCIAL HISTORY: Social History   Socioeconomic History   Marital status: Married    Spouse name: Not on file   Number of children: 1   Years of education: Not on file   Highest education level: Not  on file  Occupational History   Occupation: electrician  Tobacco Use   Smoking status: Never   Smokeless tobacco: Never  Vaping Use   Vaping Use: Never used  Substance and Sexual Activity   Alcohol use: No   Drug use: No   Sexual activity: Not on file  Other Topics Concern   Not on file  Social History Narrative   Patient is married and has 1 son.   Patient has never smoked, used tobacco products.   Patient does not drink.  Patient does not use illicit drugs.   Social Determinants of Health   Financial Resource Strain: Not on file  Food Insecurity: Not on file  Transportation Needs: Not on file  Physical Activity: Not on file  Stress: Not on file  Social Connections: Not on file  Intimate Partner Violence: Not on file      FAMILY HISTORY: Family History  Problem Relation Age of Onset   COPD Mother    Uterine cancer Mother 40   Lung cancer Brother 52       smoker   Lung cancer Maternal Grandfather    Stomach cancer Paternal Grandmother        d. 50s   Cervical cancer Maternal Aunt    Lung cancer Maternal Grandmother        smoker   Prostate cancer Paternal Grandfather    Pancreatic cancer Cousin 33       mother's maternal first cousin   Stomach cancer Cousin 78       mother's maternal first cousin   Cancer Cousin        mother's maternal first cousin with NOS cancer   Cancer Other        MGMs sister with cancer NOS   Lung cancer Other        smoker   Cancer Other        mother's maternal grandfather with NOS cancer     ALLERGIES:   has No Known Allergies.   MEDICATIONS:  Current Outpatient Medications  Medication Sig Dispense Refill   Colchicine (MITIGARE) 0.6 MG CAPS Take every two hours at onset of gout attack until pain resolves or 6 have been taken. Then decrease to two daily until flare ends (Patient taking differently: Take 0.6 mg by mouth daily as needed (gout). Take every two hours at onset of gout attack until pain resolves or 6 have been taken. Then decrease to two daily until flare ends) 30 capsule 11   fluticasone (FLONASE) 50 MCG/ACT nasal spray Place 2 sprays into both nostrils daily as needed for allergies.     fluticasone (FLONASE) 50 MCG/ACT nasal spray Place into the nose.     ibuprofen (ADVIL) 200 MG tablet Take 600 mg by mouth every 8 (eight) hours as needed for fever, headache or mild pain.     polyethylene glycol-electrolytes (TRILYTE) 420 g solution Take 4,000 mLs by mouth as directed. 4000 mL 0   No current facility-administered medications for this visit.   Facility-Administered Medications Ordered in Other Visits  Medication Dose Route Frequency Provider Last Rate Last Admin   0.9 %  sodium chloride infusion   Intravenous Continuous Monia Sabal, PA-C          REVIEW OF SYSTEMS:   .10 Point review of Systems was done is negative except as noted above.   PHYSICAL EXAMINATION: ECOG PERFORMANCE STATUS: 1 - Symptomatic but completely ambulatory  Vitals:   05/15/21 1357  BP: 132/81  Pulse: 81  Resp: 20  Temp: 97.7 F (36.5 C)  SpO2: 98%    Filed Weights   05/15/21 1357  Weight: 274 lb 3.2 oz (124.4 kg)    Body mass index is 41.69 kg/m.  Marland Kitchen GENERAL:alert, in no acute distress and comfortable SKIN: no acute rashes, no significant lesions EYES: conjunctiva are pink and non-injected, sclera anicteric OROPHARYNX: MMM, no exudates, no oropharyngeal erythema or ulceration NECK: supple, no JVD LYMPH:  no palpable lymphadenopathy in the cervical, axillary or inguinal regions LUNGS: clear to auscultation b/l with normal respiratory effort HEART: regular rate & rhythm ABDOMEN:  normoactive bowel sounds , non tender, not distended. Extremity: no pedal edema PSYCH: alert & oriented x 3 with fluent speech NEURO: no focal motor/sensory deficits  LABORATORY DATA:  I have reviewed the data as listed  CBC Latest Ref Rng & Units 05/15/2021 12/12/2020 08/08/2020  WBC 4.0 - 10.5 K/uL 5.6 4.8 11.8(H)  Hemoglobin 13.0 - 17.0 g/dL 15.6 14.9 15.2  Hematocrit 39.0 - 52.0 % 45.8 43.5 45.9  Platelets 150 - 400 K/uL 178 169 194    CMP Latest Ref Rng & Units 05/15/2021 12/12/2020 08/08/2020  Glucose 70 - 99 mg/dL 108(H) 96 139(H)  BUN 6 - 20 mg/dL 20 18 17   Creatinine 0.61 - 1.24 mg/dL 1.34(H) 1.23 1.12  Sodium 135 - 145 mmol/L 138 139 135  Potassium 3.5 - 5.1 mmol/L 4.2 4.4 3.6  Chloride 98 - 111 mmol/L 103 104 100  CO2 22 - 32 mmol/L 30 28 25   Calcium 8.9 - 10.3 mg/dL 8.9 8.9 8.7(L)  Total Protein 6.5 - 8.1 g/dL 6.9 6.8 7.5  Total Bilirubin 0.3 - 1.2 mg/dL 0.4 0.8 0.6  Alkaline Phos 38 - 126 U/L 63 59 65  AST 15 - 41 U/L 17 20 25   ALT 0 - 44 U/L 18 17 21      RADIOGRAPHIC STUDIES: I have personally reviewed the radiological images as listed  and agreed with the findings in the report.  PET/CT 08/10/2019: IMPRESSION: 1. The 2.8 cm right level IIa lymph node has a maximum SUV of 28.4. A primary lesion is not readily seen. There is some asymmetric palatine tonsillar activity with the left tonsil having maximum SUV of 12.4 and the right 7.8, but no tonsillar mass is observed on the CT data. 2. No compelling findings of active malignancy in the chest, abdomen/pelvis, or skeleton. There is some accentuated anal activity which is likely physiologic given the lack of visible abnormality on the CT data. 3. Other imaging findings of potential clinical significance: Diffuse hepatic steatosis. Hypodense left renal lesion is probably a cyst but technically too small to characterize.     Electronically Signed   By: Van Clines M.D.   On: 08/10/2019 15:54    08/21/19 of Surgical Pathology [id number]    SURGICAL PATHOLOGY  * THIS IS AN AMENDED REPORT   CASE: [id number]  PATIENT: [facility]  Surgical Pathology Report   Amendment    Reason for Amendment #1: Other   Clinical History: squamous cell of neck and head (cm)     FINAL MICROSCOPIC DIAGNOSIS:   A. TONSIL, RIGHT:  - Squamous cell carcinoma, basaloid, p16 positive.  - Margins not involved.  - Carcinoma focally 1 mm from surgical margin.  - See oncology table and comment.   B. TONSIL, LEFT:  - Benign tonsil.  - No malignancy identified.   C. TONGUE, RIGHT BASE, BIOPSY:  - Benign tonsillar tissue.  - No malignancy identified.  D. TONGUE, LEFT BASE, BIOPSY:  - Benign tonsillar tissue.  - No malignancy identified.   ONCOLOGY TABLE:  PHARYNX (OROPHARYNX, HYPOPHARYNX, NASOPHARYNX):  Procedure: Right and left tonsillectomy and right and left tongue base  biopsies.  Tumor Site: Right tonsil.  Tumor Laterality: Right tonsil.  Tumor Focality: Unifocal.  Tumor Size: 1 cm x 0.3 cm (3 mm) depth.  Histologic Type: Squamous cell carcinoma, basaloid  type, p16 positive,  EBV negative.  Histologic Grade: Poorly differentiated.  Margins: Free of tumor.  Carcinoma focally 1 mm from the nearest  surgical margin.       Distance to closest Margin: 1 mm.  Lymphovascular Invasion: Present.  Perineural Invasion: Not identified.  Regional Lymph Nodes: No lymph nodes submitted with this case.  The  previous right neck lymph node needle core biopsy (MCS [id number] [phone]) shows  squamous cell carcinoma.  Pathologic Stage Classification (pTNM, AJCC 8th Edition): pT1, pN1, see  comment.    09/11/19 of Surgical Pathology Report [id number])  COMMENT:   Metastatic carcinoma is present in 3 lymph nodes - 1 level 1 lymph node,  1 level 2 lymph node (this metastatic focus also involves skeletal  muscle), and 1 level 3 lymph node (highlighted by a cytokeratin  immunohistochemical stain).  Additional studies can be performed (block  A11) upon clinician request.    ASSESSMENT & PLAN:   [name] is a 56 y.o. male with:  Rt Tonsilar Basaloid Squamous cell carcinoma , HPV+ve. PT1,pN1,M0. 3/49 LN+ve High risk features - LVI, extranodal extension, high risk morphology. Significant second hand smoke exposure S/p b/l tonsillectomy on 08/21/2019 S/p rt radical neck dissection PLAN: -Discussed patient's labs from today CBC and CMP stable. -CT chest 04/23/2021 with no evidence of metastatic disease to the chest -Continue follow-up with Dr. Constance Holster for continued local evaluation for squamous cell carcinoma recurrence assessment every 3 to 6 months as as per his recommendations.  2.  Elevated TSH likely mild hypothyroidism from neck radiation PLAN -We will refer to endocrinology  FOLLOW UP: Return to clinic with Dr. Irene Limbo with labs in 12 months Continue follow-up with Dr. Constance Holster per his recommendations Referral to endocrinology for evaluation of mild hypothyroidism likely from neck radiation.  Sullivan Lone MD [place] [facility]  [facility] Hematology/Oncology Physician [facility]

## 2021-05-25 NOTE — Addendum Note (Signed)
Addended by: Sullivan Lone on: 05/25/2021 08:23 PM   Modules accepted: Orders

## 2021-05-26 ENCOUNTER — Telehealth: Payer: Self-pay

## 2021-05-26 NOTE — Telephone Encounter (Signed)
Contacted pt per Dr Irene Limbo to let pt know: TSH is continuing to increase suggesting development of hypothyroidism related to his neck radiation.  Dr Irene Limbo has  placed an endocrinology referral.  Referral was faxed to [facility].  Pt acknowledged and verbalized understanding.

## 2021-07-17 ENCOUNTER — Telehealth: Payer: Self-pay | Admitting: Family Medicine

## 2021-07-17 ENCOUNTER — Ambulatory Visit: Payer: 59

## 2021-07-17 ENCOUNTER — Other Ambulatory Visit: Payer: Self-pay

## 2021-07-17 DIAGNOSIS — G4733 Obstructive sleep apnea (adult) (pediatric): Secondary | ICD-10-CM

## 2021-07-21 ENCOUNTER — Telehealth: Payer: Self-pay | Admitting: Pulmonary Disease

## 2021-07-21 DIAGNOSIS — G4733 Obstructive sleep apnea (adult) (pediatric): Secondary | ICD-10-CM

## 2021-07-21 NOTE — Telephone Encounter (Signed)
Called and went over results with patient he voiced understanding and states he has a cpap machine he uses currently and that he does need a new one at the moment. Will place order for pressures rec by Dr. Elsworth Soho in result note. Patient is aware. Nothing further needed.  ?

## 2021-07-21 NOTE — Telephone Encounter (Signed)
HST showed severe OSA with AHI 48/ hr ?Suggest autoCPAP  10-15 cm, mask of choice ?OV with me in 6 wks after starting ? ?

## 2021-09-08 ENCOUNTER — Ambulatory Visit: Payer: 59 | Admitting: Pulmonary Disease

## 2021-09-08 ENCOUNTER — Encounter: Payer: Self-pay | Admitting: Pulmonary Disease

## 2021-09-08 VITALS — BP 136/84 | HR 74 | Temp 98.7°F | Ht 68.0 in | Wt 282.4 lb

## 2021-09-08 DIAGNOSIS — G4733 Obstructive sleep apnea (adult) (pediatric): Secondary | ICD-10-CM | POA: Diagnosis not present

## 2021-09-08 NOTE — Assessment & Plan Note (Signed)
He has continued to gain weight. ?We discussed weight correlation with OSA.  He is starting on a diet and exercise program ?

## 2021-09-08 NOTE — Progress Notes (Signed)
? ?  Subjective:  ? ? Patient ID: Dustin Pickering Sr., male    DOB: March 26, 1966, 56 y.o.   MRN: [id number] ? ?HPI ?56 yo for FU of severe OSA ? ?OSA was diagnosed 20 years ago at [facility] .  He is on his third machine auto 10-15, uses a full facemask since he is a mouth breather.  He also uses Flonase on a daily basis .he was evaluated by ENT and nasal septoplasty is being considered  ?after his treatment for tonsillar cancer, he transiently required PEG and his weight has dropped from a peak of 311 ? ?PMh -tonsillar cancer status tonsillectomy/adenoidectomy, post neck dissection , chemo RT, last 10/2019 ? ?Last visit we adjusted auto CPAP settings, repeated home sleep test that we reviewed today which showed severe OSA.  He is very compliant he does not note any change in his then from the settings changes that we made.  Denies any problems with mask or pressure.  He always has dryness due to radiation to his oral airway. ?He continues to gain weight and has gained up to 280 pounds, PEG has been discontinued ? ?Significant tests/ events reviewed ? ?07/2021 HST - severe OSA with AHI 48/ hr ? ?Review of Systems ? ?neg for any significant sore throat, dysphagia, itching, sneezing, nasal congestion or excess/ purulent secretions, fever, chills, sweats, unintended wt loss, pleuritic or exertional cp, hempoptysis, orthopnea pnd or change in chronic leg swelling. Also denies presyncope, palpitations, heartburn, abdominal pain, nausea, vomiting, diarrhea or change in bowel or urinary habits, dysuria,hematuria, rash, arthralgias, visual complaints, headache, numbness weakness or ataxia. ? ?   ?Objective:  ? Physical Exam ? ?Gen. Pleasant, obese, in no distress ?ENT - no lesions, no post nasal drip ?Neck: No JVD, no thyromegaly, no carotid bruits ?Lungs: no use of accessory muscles, no dullness to percussion, decreased without rales or rhonchi  ?Cardiovascular: Rhythm regular, heart sounds  normal, no murmurs or gallops, no  peripheral edema ?Musculoskeletal: No deformities, no cyanosis or clubbing , no tremors ? ? ? ?   ?Assessment & Plan:  ? ? ?

## 2021-09-08 NOTE — Patient Instructions (Signed)
?  CPAP is working well ?CPAP supplies will be renewed x 1 year ?

## 2021-09-08 NOTE — Assessment & Plan Note (Signed)
We reviewed download on his CPAP which shows excellent control of events on auto settings 10 to 15 cm with average pressure of 13 and maximum pressure of 14 cm.  He is very compliant and minimal leak is noted.  CPAP is only helped improve his daytime somnolence and fatigue ? ?Weight loss encouraged, compliance with goal of at least 4-6 hrs every night is the expectation. ?Advised against medications with sedative side effects ?Cautioned against driving when sleepy - understanding that sleepiness will vary on a day to day basis ? ?

## 2021-10-09 ENCOUNTER — Other Ambulatory Visit: Payer: Self-pay | Admitting: Family Medicine

## 2021-10-09 ENCOUNTER — Telehealth: Payer: Self-pay | Admitting: Family Medicine

## 2021-10-09 MED ORDER — SCOPOLAMINE 1 MG/3DAYS TD PT72: 1.0000 | MEDICATED_PATCH | TRANSDERMAL | 0 refills | Status: DC

## 2021-10-09 NOTE — Telephone Encounter (Signed)
Please let the patient know that I sent their prescription to their pharmacy. Thanks, WS 

## 2021-10-10 NOTE — Telephone Encounter (Signed)
Patient aware.

## 2021-11-02 IMAGING — XA IR PERC PLACEMENT GASTROSTOMY
1 series · 2 of 2 positions shown · non-contrast
Comparison: none

CLINICAL DATA: Tonsillar carcinoma, needs enteral feeding support

EXAM:
PERC PLACEMENT GASTROSTOMY
FLUOROSCOPY TIME:  72 seconds; 43 mGy
TECHNIQUE: The procedure, risks, benefits, and alternatives were explained to
the patient. Questions regarding the procedure were encouraged and
answered. The patient understands and consents to the procedure.

[Series 2: fl (-) angio · 2 of 2 slices shown]
[im 1/2]
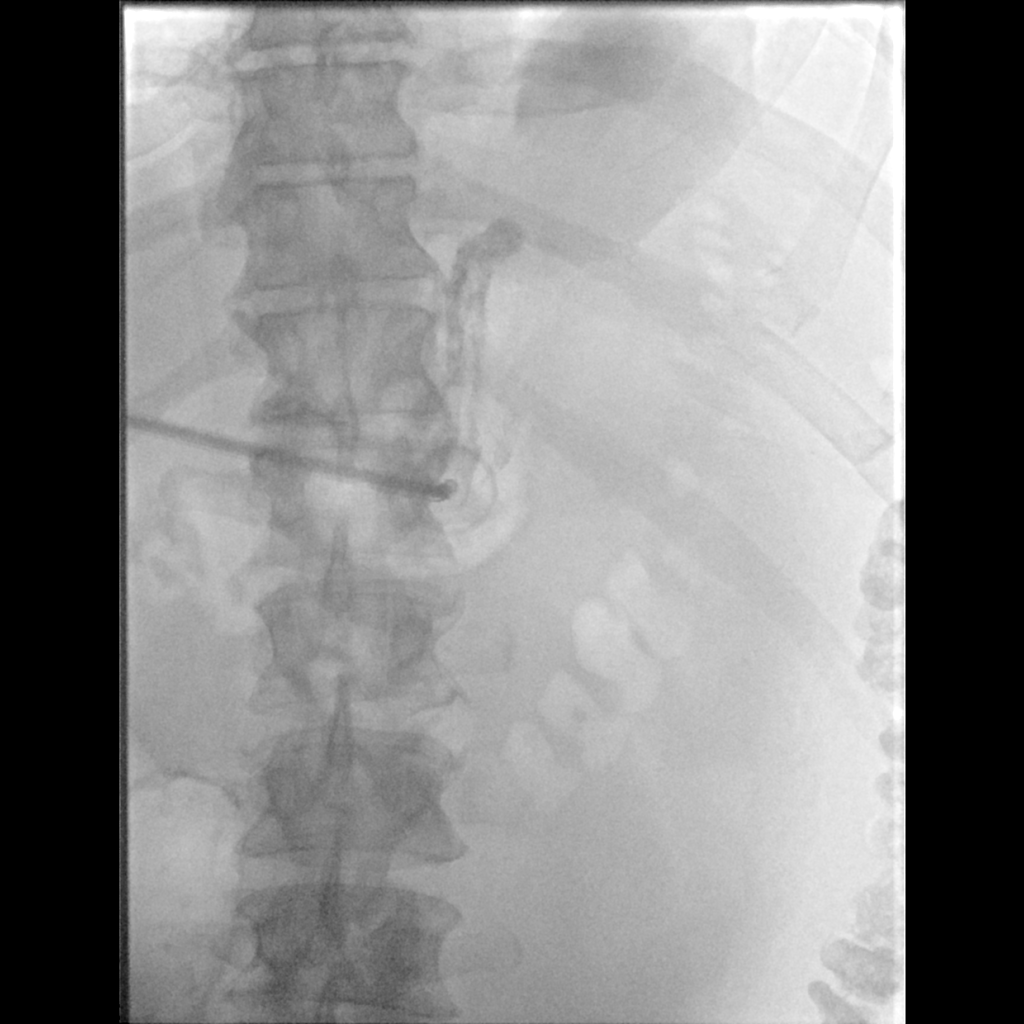
[im 2/2]
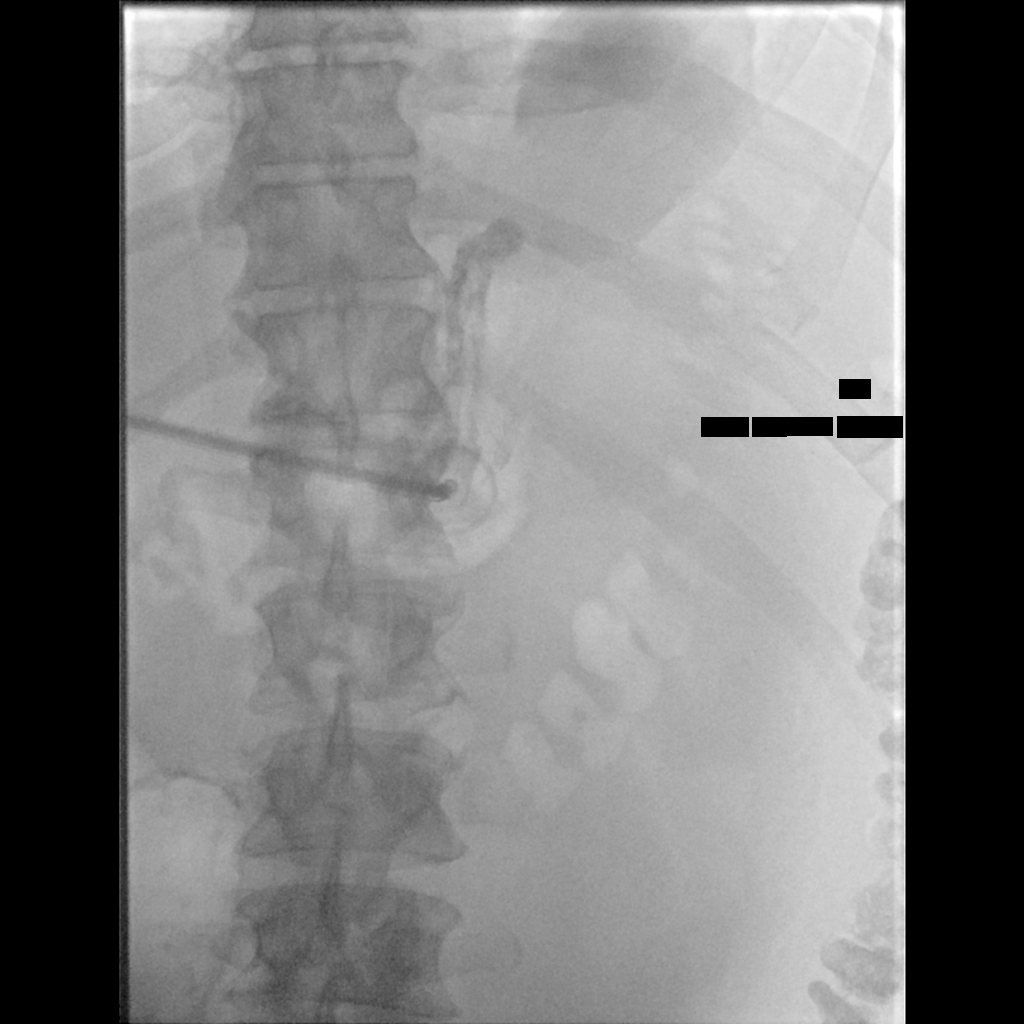

[2 of 2 positions shown; findings below may reference images not displayed]

As antibiotic prophylaxis, cefazolin 2 g was ordered pre-procedure
and administered intravenously within one hour of incision.

A safe percutaneous approach was confirmed on recent PET-CT.

A 5 French angiographic catheter was placed as orogastric tube. The
upper abdomen was prepped with Betadine, draped in usual sterile
fashion, and infiltrated locally with 1% lidocaine.

Intravenous Fentanyl 34mcg and Versed 2mg were administered as
conscious sedation during continuous monitoring of the patient's
level of consciousness and physiological / cardiorespiratory status
by the radiology RN, with a total moderate sedation time of 8
minutes.

0.5 mg glucagon given IV to facilitate gastric distention.

Stomach was insufflated using air through the orogastric tube. An 18
French sheath needle was advanced percutaneously into the gastric
lumen under fluoroscopy. Gas could be aspirated and a small contrast
injection confirmed intraluminal spread. The sheath was exchanged
over a guidewire for a 9 French vascular sheath, through which the
snare device was advanced and used to snare a guidewire passed
through the orogastric tube. This was withdrawn, and the snare
attached to the 20 French pull-through gastrostomy tube, which was
advanced antegrade, positioned with the internal bumper securing the
anterior gastric wall to the anterior abdominal wall. Small contrast
injection confirms appropriate positioning. The external bumper was
applied and the catheter was flushed.

COMPLICATIONS:
COMPLICATIONS
none
IMPRESSION: 1. Technically successful 20 French pull-through gastrostomy
placement under fluoroscopy.

## 2021-12-01 ENCOUNTER — Other Ambulatory Visit: Payer: Self-pay

## 2021-12-09 ENCOUNTER — Other Ambulatory Visit: Payer: Self-pay

## 2021-12-22 ENCOUNTER — Telehealth: Payer: Self-pay | Admitting: Hematology

## 2021-12-22 NOTE — Telephone Encounter (Signed)
Rescheduled upcoming appointment due to provider's template change. Patient is aware of changes. ?

## 2022-01-11 NOTE — Progress Notes (Unsigned)
Name: Dustin Zeman Sr.  MRN/ DOB: [id number], 12/03/1965    Age/ Sex: 56 y.o., male    PCP: Claretta Fraise, MD   Reason for Endocrinology Evaluation: Subclinical hypothyroidism     Date of Initial Endocrinology Evaluation: 01/13/2022     HPI: Mr. Dustin Hoehn Sr. is a 56 y.o. male with a past medical history of Hx of tonsillar cancer, OSA. The patient presented for initial endocrinology clinic visit on 01/13/2022 for consultative assistance with his Subclinical Hypothyroidism.   Patient has been diagnosed with right  tonsillar cancer (Sq. Cell carcinoma)  in June 2021, he is S/P right neck dissection, chemo/XRT  During evaluation for fatigue by oncology, the patient has been noted with an elevated TSH in August 2022, TSH max level 5.67 uIU/ml in January 2023, this has been attributed to prior radiation treatment of the neck   His weight has been gradually increasing  Denies local neck swelling  He is more tired than usual, that started after cancer treatment  Denies constipation or diarrhea  Denies palpitations  Has hand tremors  Has occasional anxiety but no depression  No LE edema    No Fh of thyroid disease      HISTORY:  Past Medical History:  Past Medical History:  Diagnosis Date   Arthritis    Change in stool 02/22/2017   Family history of cancer    Family history of uterine cancer    Gout 02/22/2017   Hyperlipidemia    Sleep apnea    Uses CPAP   Tonsil cancer ([place]) DX'd 07/2019   metastatic   Past Surgical History:  Past Surgical History:  Procedure Laterality Date   CARPAL TUNNEL RELEASE Right 04/10/2019   CHOLECYSTECTOMY N/A 09/09/2020   Procedure: LAPAROSCOPIC CHOLECYSTECTOMY;  Surgeon: [name], MD;  [facility]: [facility];  Service: General;  Laterality: N/A;   COLONOSCOPY N/A 02/20/2021   Procedure: COLONOSCOPY;  Surgeon: Rogene Houston, MD;  [facility];  Service: Endoscopy;  Laterality: N/A;  9:30   DIRECT LARYNGOSCOPY N/A  08/21/2019   Procedure: DIRECT LARYNGOSCOPY - WITH BX;  Surgeon: [name], MD;  Location: [facility];  Service: ENT;  Laterality: N/A;   IR GASTROSTOMY TUBE MOD SED  10/13/2019   IR GASTROSTOMY TUBE REMOVAL  06/10/2020   IR IMAGING GUIDED PORT INSERTION  10/13/2019   IR REMOVAL TUN ACCESS W/ PORT W/O FL MOD SED  06/10/2020   NECK DISSECTION  09/11/2019   POLYPECTOMY  02/20/2021   Procedure: POLYPECTOMY;  Surgeon: Rogene Houston, MD;  [facility];  Service: Endoscopy;;   RADICAL NECK DISSECTION Right 09/11/2019   Procedure: RADICAL NECK DISSECTION;  Surgeon: [name], MD;  Location: [facility];  Service: ENT;  Laterality: Right;   removal of right jugular vein in neck     TONSILLECTOMY Bilateral 08/21/2019   Procedure: TONSILLECTOMY;  Surgeon: [name], MD;  Location: [facility];  Service: ENT;  Laterality: Bilateral;   UMBILICAL HERNIA REPAIR  09/09/2020   Procedure: REPAIR UMBILICAL HERNIA;  Surgeon: [name], MD;  [facility];  Service: General;;   WISDOM TOOTH EXTRACTION      Social History:  reports that he has never smoked. He has never used smokeless tobacco. He reports that he does not drink alcohol and does not use drugs. Family History: family history includes COPD in his mother; Cancer in his cousin and other family members; Cervical cancer in his maternal  aunt; Lung cancer in his maternal grandfather, maternal grandmother, and another family member; Lung cancer (age of onset: 5) in his brother; Pancreatic cancer (age of onset: 63) in his cousin; Prostate cancer in his paternal grandfather; Stomach cancer in his paternal grandmother; Stomach cancer (age of onset: 45) in his cousin; Uterine cancer (age of onset: 64) in his mother.   HOME MEDICATIONS: Allergies as of 01/13/2022   No Known Allergies      Medication List        Accurate as of January 13, 2022  2:08 PM. If you have any questions, ask your nurse or doctor.           Colchicine 0.6 MG Caps Commonly known as: Mitigare Take every two hours at onset of gout attack until pain resolves or 6 have been taken. Then decrease to two daily until flare ends What changed:  how much to take how to take this when to take this reasons to take this   fluticasone 50 MCG/ACT nasal spray Commonly known as: FLONASE Place 2 sprays into both nostrils daily as needed for allergies. What changed: Another medication with the same name was removed. Continue taking this medication, and follow the directions you see here. Changed by: Dorita Sciara, MD   ibuprofen 200 MG tablet Commonly known as: ADVIL Take 600 mg by mouth every 8 (eight) hours as needed for fever, headache or mild pain.   scopolamine 1 MG/3DAYS Commonly known as: Transderm Scop (1.5 MG) Place 1 patch (1.5 mg total) onto the skin every 3 (three) days.          REVIEW OF SYSTEMS: A comprehensive ROS was conducted with the patient and is negative except as per HPI     OBJECTIVE:  VS: BP 126/80 (BP Location: Left Arm, Patient Position: Sitting, Cuff Size: Large)   Pulse 76   Ht '5\' 8"'$  (1.727 m)   Wt 286 lb (129.7 kg)   SpO2 99%   BMI 43.49 kg/m    Wt Readings from Last 3 Encounters:  01/13/22 286 lb (129.7 kg)  09/08/21 282 lb 6.4 oz (128.1 kg)  05/15/21 274 lb 3.2 oz (124.4 kg)     EXAM: General: Pt appears well and is in NAD  Eyes: External eye exam normal without stare, lid lag or exophthalmos.  EOM intact.    Neck: General: Supple without adenopathy. Thyroid: Thyroid size normal.  No goiter or nodules appreciated.   Lungs: Clear with good BS bilat with no rales, rhonchi, or wheezes  Heart: Auscultation: RRR.  Abdomen: Normoactive bowel sounds, soft, nontender, without masses or organomegaly palpable  Extremities:  BL LE: No pretibial edema normal ROM and strength.  Mental Status: Judgment, insight: Intact Orientation: Oriented to time, place, and person Mood and  affect: No depression, anxiety, or agitation     DATA REVIEWED:  Latest Reference Range & Units 01/13/22 09:18  TSH 0.35 - 5.50 uIU/mL 9.99 (H)  T4,Free(Direct) 0.60 - 1.60 ng/dL 0.65      ASSESSMENT/PLAN/RECOMMENDATIONS:   Subclinical Hypothyroidism:  -Patient has been noted with fatigue and weight gain -His TSH has trended up to 9.99 uIU/mL -I have recommended starting LT-for replacement, which he is in agreement of -- Pt educated extensively on the correct way to take levothyroxine (first thing in the morning with water, 30 minutes before eating or taking other medications). - Pt encouraged to double dose the following day if she were to miss a dose given long half-life of levothyroxine. -  TPO Ab's pending   Medications : Start levothyroxine 50 mcg daily    Follow-up in 4 months   Signed electronically by: Mack Guise, MD  [facility] Endocrin[facility]  [facility] [address], [place], [place] [address] Phone: [phone] FAX: [phone]   CC: Claretta Fraise, MD [place] [address] Phone: [phone] Fax: [phone]   Return to Endocrinology clinic as below: Future Appointments  Date Time Provider [place]  05/20/2022  7:50 AM Sheilah Rayos, Melanie Crazier, MD [facility] None  05/20/2022  1:30 PM [facility] None  05/20/2022  2:00 PM Brunetta Genera, MD [facility] None  04/28/2023  8:45 AM McKenzie, Candee Furbish, MD [facility] None

## 2022-01-13 ENCOUNTER — Encounter: Payer: Self-pay | Admitting: Hematology

## 2022-01-13 ENCOUNTER — Encounter: Payer: Self-pay | Admitting: Internal Medicine

## 2022-01-13 ENCOUNTER — Ambulatory Visit: Payer: Medicare Other | Admitting: Internal Medicine

## 2022-01-13 VITALS — BP 126/80 | HR 76 | Ht 68.0 in | Wt 286.0 lb

## 2022-01-13 DIAGNOSIS — E039 Hypothyroidism, unspecified: Secondary | ICD-10-CM | POA: Insufficient documentation

## 2022-01-13 DIAGNOSIS — E038 Other specified hypothyroidism: Secondary | ICD-10-CM | POA: Diagnosis not present

## 2022-01-13 LAB — T4, FREE: Free T4: 0.65 ng/dL (ref 0.60–1.60)

## 2022-01-13 LAB — TSH: TSH: 9.99 u[IU]/mL — ABNORMAL HIGH (ref 0.35–5.50)

## 2022-01-13 MED ORDER — LEVOTHYROXINE SODIUM 50 MCG PO TABS: 50.0000 ug | ORAL_TABLET | Freq: Every day | ORAL | 3 refills | Status: DC

## 2022-01-13 NOTE — Patient Instructions (Signed)

## 2022-01-14 LAB — THYROID PEROXIDASE ANTIBODY: Thyroperoxidase Ab SerPl-aCnc: <1 IU/mL (ref <9)

## 2022-01-14 LAB — T3: T3, Total: 132 ng/dL (ref 76–181)

## 2022-02-06 ENCOUNTER — Encounter: Payer: Self-pay | Admitting: Hematology

## 2022-02-24 ENCOUNTER — Telehealth: Payer: Medicare Other | Admitting: Family Medicine

## 2022-02-24 ENCOUNTER — Encounter: Payer: Self-pay | Admitting: Family Medicine

## 2022-02-24 DIAGNOSIS — U071 COVID-19: Secondary | ICD-10-CM | POA: Diagnosis not present

## 2022-02-24 MED ORDER — NIRMATRELVIR/RITONAVIR (PAXLOVID)TABLET: 3.0000 | ORAL_TABLET | Freq: Two times a day (BID) | ORAL | 0 refills | Status: AC

## 2022-02-24 NOTE — Progress Notes (Addendum)
Subjective:    Patient ID: Dustin Leiter Sr., male    DOB: Oct 09, 1965, 56 y.o.   MRN: [id number]   HPI: Dustin Legate Sr. is a 56 y.o. male presenting for onset yesterday with HA,runny nose & congestion, body aches and low grade fever. A little bit of cough. Sneezing more. No earache or sore throat. No dyspnea. Not sleeping. Had positive Covid test earlier today.      12/31/2020    1:17 PM 12/31/2020   12:59 PM 03/27/2020    4:17 PM 06/08/2019    9:37 AM 04/03/2019   11:20 AM  Depression screen PHQ 2/9  Decreased Interest 1 0 0 0 0  Down, Depressed, Hopeless 0 0 0 0 0  PHQ - 2 Score 1 0 0 0 0  Altered sleeping 1    0  Tired, decreased energy 1    0  Change in appetite 1    0  Feeling bad or failure about yourself  0    0  Trouble concentrating 1    0  Moving slowly or fidgety/restless 0    0  Suicidal thoughts 0    0  PHQ-9 Score 5    0  Difficult doing work/chores Not difficult at all         Relevant past medical, surgical, family and social history reviewed and updated as indicated.  Interim medical history since our last visit reviewed. Allergies and medications reviewed and updated.  ROS:  Review of Systems  Constitutional:  Negative for activity change, appetite change, chills and fever.  HENT:  Positive for congestion, postnasal drip, rhinorrhea and sneezing. Negative for ear discharge, ear pain, hearing loss, nosebleeds and trouble swallowing.   Respiratory:  Positive for cough. Negative for chest tightness and shortness of breath.   Cardiovascular:  Negative for chest pain and palpitations.  Skin:  Negative for rash.  Neurological:  Positive for headaches.     Social History   Tobacco Use  Smoking Status Never  Smokeless Tobacco Never       Objective:     Wt Readings from Last 3 Encounters:  01/13/22 286 lb (129.7 kg)  09/08/21 282 lb 6.4 oz (128.1 kg)  05/15/21 274 lb 3.2 oz (124.4 kg)     Exam deferred. Video performed. A&O X 3. NAD. Neuro  grossly intact via video Assessment & Plan:   1. COVID-19 virus infection     Meds ordered this encounter  Medications   nirmatrelvir/ritonavir EUA (PAXLOVID) 20 x 150 MG & 10 x '100MG'$  TABS    Sig: Take 3 tablets by mouth 2 (two) times daily for 5 days. (Take nirmatrelvir 150 mg two tablets twice daily for 5 days and ritonavir 100 mg one tablet twice daily for 5 days) Patient GFR is 60    Dispense:  30 tablet    Refill:  0    No orders of the defined types were placed in this encounter.     Diagnoses and all orders for this visit:  COVID-19 virus infection  Other orders -     nirmatrelvir/ritonavir EUA (PAXLOVID) 20 x 150 MG & 10 x '100MG'$  TABS; Take 3 tablets by mouth 2 (two) times daily for 5 days. (Take nirmatrelvir 150 mg two tablets twice daily for 5 days and ritonavir 100 mg one tablet twice daily for 5 days) Patient GFR is 60    Virtual Visit via video  I discussed the limitations, risks, security and privacy concerns of performing  an evaluation and management service by telephone and the availability of in person appointments. The patient was identified with two identifiers. Pt.expressed understanding and agreed to proceed. Pt. Is at home. Dr. Livia Snellen is in his office.  Follow Up Instructions:   I discussed the assessment and treatment plan with the patient. The patient was provided an opportunity to ask questions and all were answered. The patient agreed with the plan and demonstrated an understanding of the instructions.   The patient was advised to call back or seek an in-person evaluation if the symptoms worsen or if the condition fails to improve as anticipated.   Total minutes including chart review and phone contact time: 13   Follow up plan: Return if symptoms worsen or fail to improve.  Claretta Fraise, MD [place]

## 2022-03-09 ENCOUNTER — Encounter: Payer: Self-pay | Admitting: Hematology

## 2022-03-25 IMAGING — CT CT NECK W/ CM
3 of 4 series · 14 of 35 positions shown, 17 images · IV contrast (OMNIPAQUE)
Comparison: CT neck 07/13/2019.  PET-CT 08/10/2019

CLINICAL DATA: Malignant neoplasm tonsillar fossa. Post chemo and
radiation.

EXAM:
CT NECK WITH CONTRAST
TECHNIQUE: Multidetector CT imaging of the neck was performed using the
standard protocol following the bolus administration of intravenous
contrast.
CONTRAST:  100mL OMNIPAQUE IOHEXOL 300 MG/ML  SOLN

[Series 602: axial reformats · axial · 0.43mm/px · z∈[-207,-48]mm · 6 of 137 slices shown, 8 images]
[im 20/137  soft-tissue]
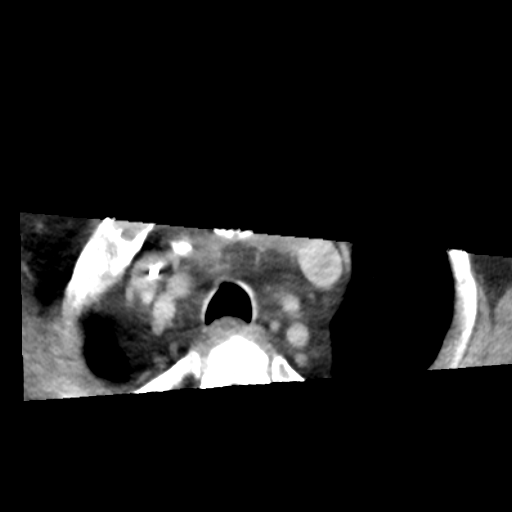
[im 20/137  bone]
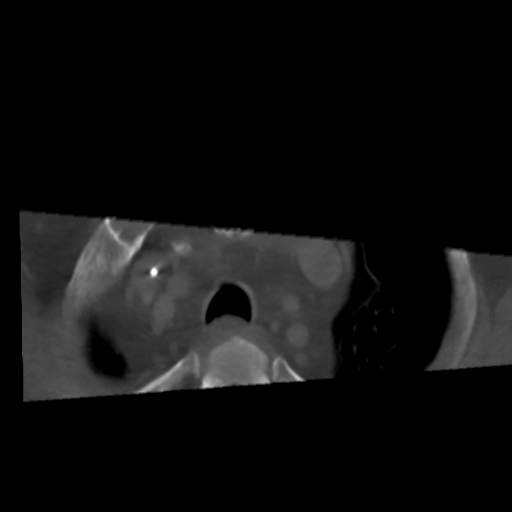
[im 39/137  bone]
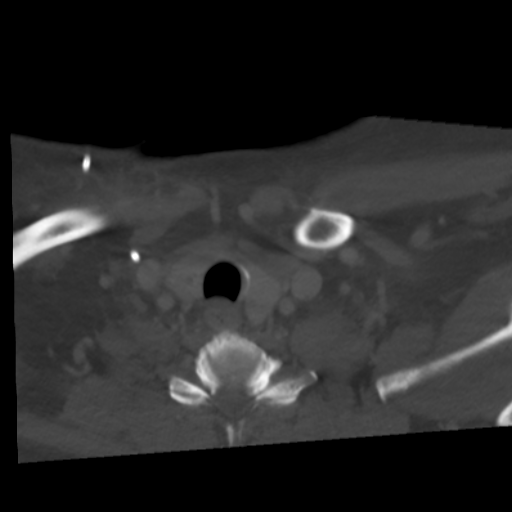
[im 59/137  bone]
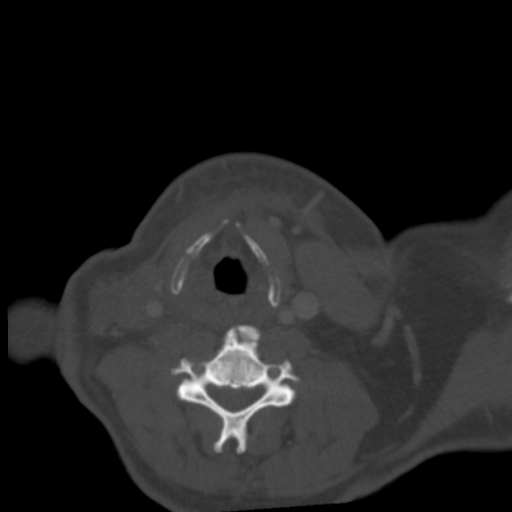
[im 78/137  bone]
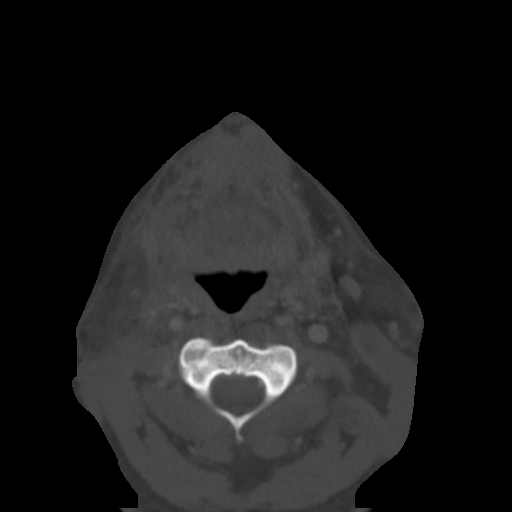
[im 98/137  soft-tissue]
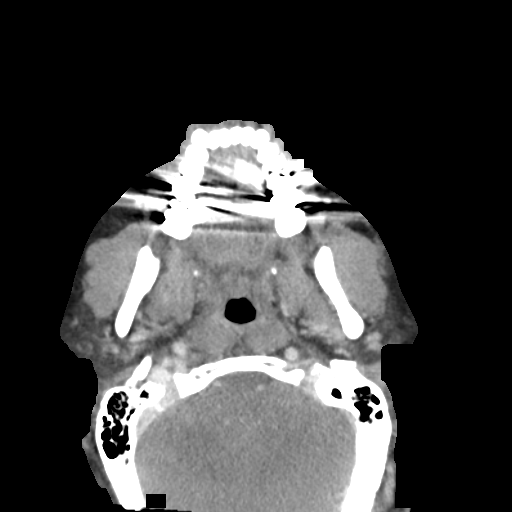
[im 98/137  bone]
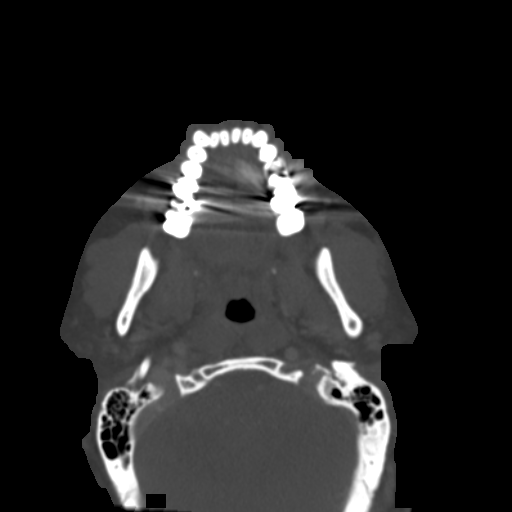
[im 117/137  bone]
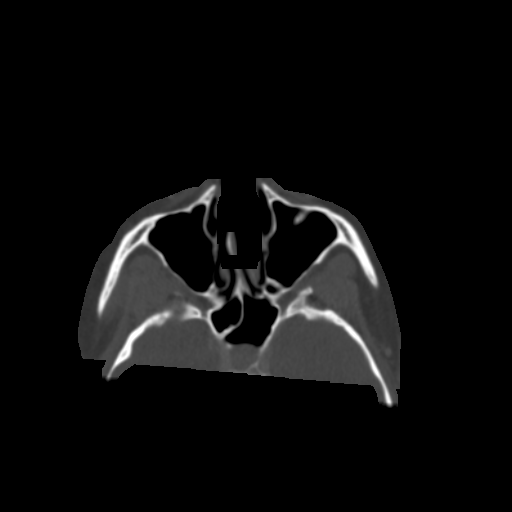

[Series 603: coronal images · coronal · 0.43mm/px · 3 of 110 slices shown]
[im 42/110  bone]
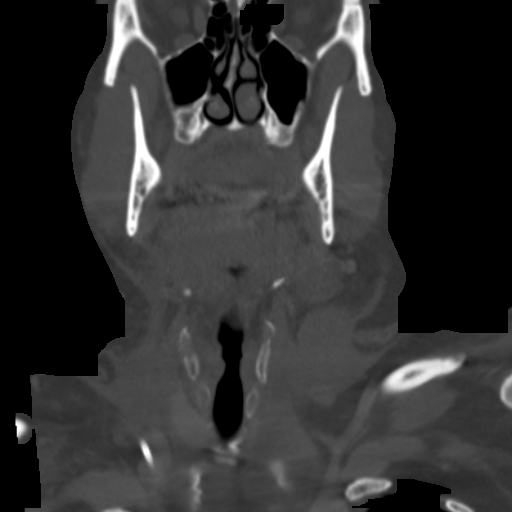
[im 51/110  bone]
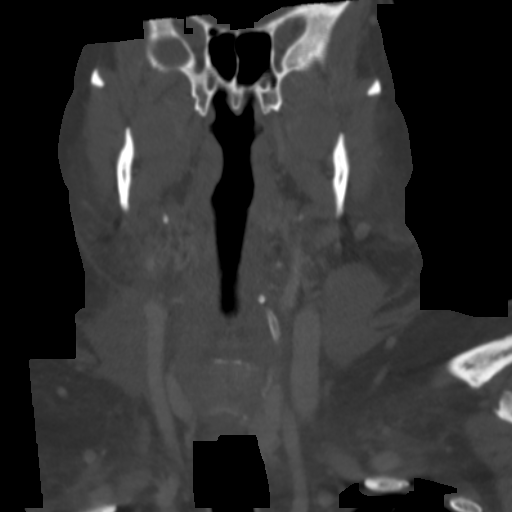
[im 59/110  bone]
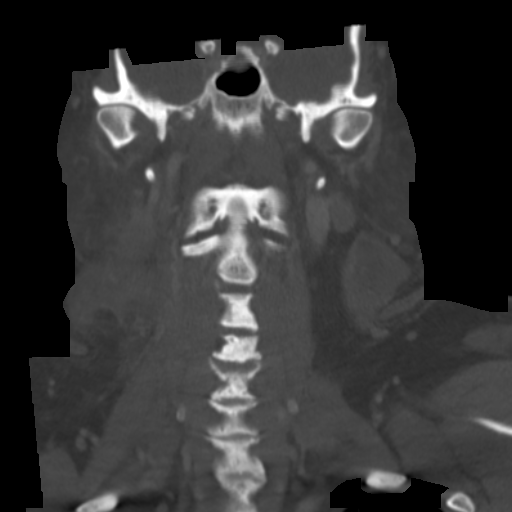

[Series 604: sagittal · sagittal · 0.43mm/px · 5 of 93 slices shown, 6 images]
[im 31/93  bone]
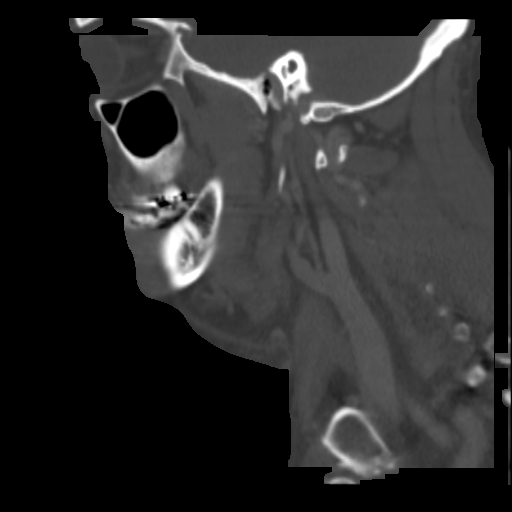
[im 39/93  bone]
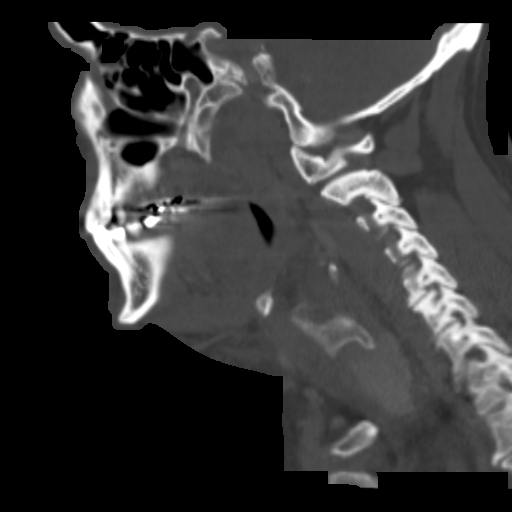
[im 47/93  soft-tissue]
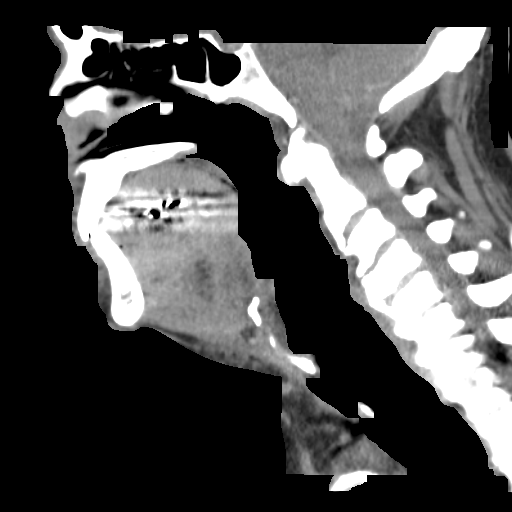
[im 47/93  bone]
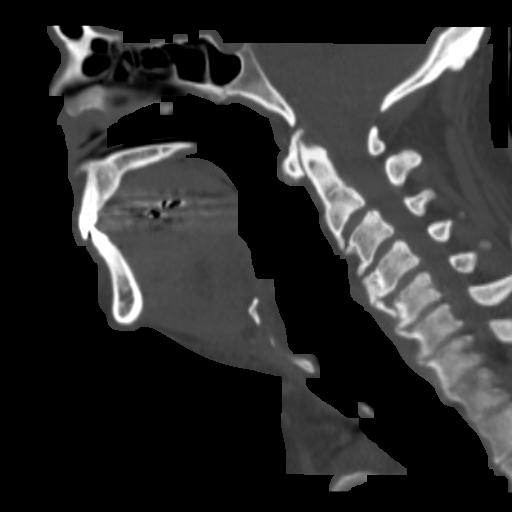
[im 54/93  bone]
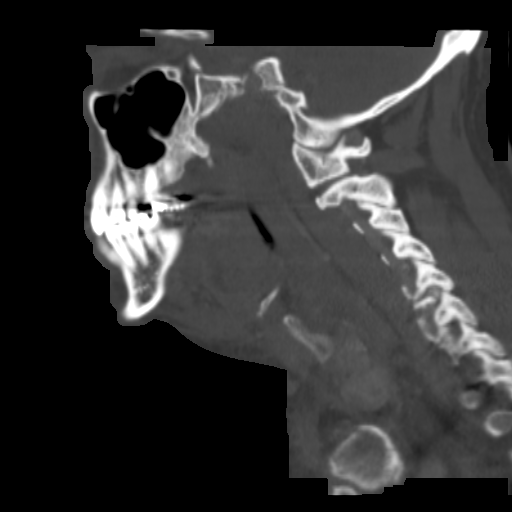
[im 62/93  bone]
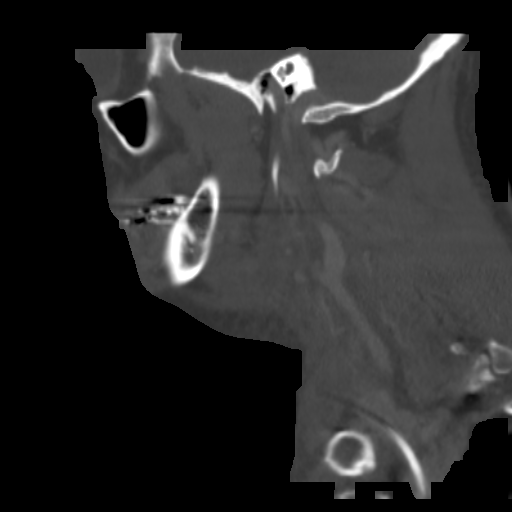

[14 of 35 positions shown; findings below may reference images not displayed]

FINDINGS: Pharynx and larynx: Mild pharyngeal mucosal and submucosal edema due
to radiation. This extends into the larynx. No pharyngeal mass
identified.

Salivary glands: Parotid normal bilaterally. Atrophic changes in the
submandibular glands especially on the right.

Thyroid: Negative

Lymph nodes: Previously noted enlarged right level 2 lymph node
shows marked improvement. There is soft tissue stranding in the area
but no well-defined enlarged lymph node is identified in this area.
No new adenopathy in the neck.

Vascular: Right jugular Port-A-Cath with tip not visualized. Right
jugular vein appears occluded above the Port-A-Cath and was patent
previously. This may be related to radiation change. Left jugular
vein patent. Both carotid arteries patent.

Limited intracranial: Negative

Visualized orbits: Negative

Mastoids and visualized paranasal sinuses: Paranasal sinuses clear
bilaterally. Mastoid clear bilaterally.

Skeleton: Cervical spondylosis.  No acute skeletal abnormality.

Upper chest: Chest CT reported separately from today

Other: Post radiation changes seen in the soft tissues of the
anterior neck and right neck. The right sternocleidomastoid muscle
shows atrophy compared to the prior study with irregularity likely
due to radiation change. Right jugular vein occlusion may be due to
radiation change.
IMPRESSION: Radiation changes in the neck which are most prominent in the right
neck as described above. Right jugular vein now occluded.

Enlarged right level 2 lymph node is considerably smaller now
difficult to measure. There are prominent radiation changes in the
right neck however no adenopathy is identified on today's study.

## 2022-04-28 ENCOUNTER — Other Ambulatory Visit: Payer: Self-pay | Admitting: Pharmacist

## 2022-05-15 ENCOUNTER — Other Ambulatory Visit: Payer: Self-pay

## 2022-05-15 DIAGNOSIS — C099 Malignant neoplasm of tonsil, unspecified: Secondary | ICD-10-CM

## 2022-05-15 DIAGNOSIS — R7989 Other specified abnormal findings of blood chemistry: Secondary | ICD-10-CM

## 2022-05-19 NOTE — Progress Notes (Unsigned)
Name: Dustin Kliebert Sr.  MRN/ DOB: [id number], 1966-02-25    Age/ Sex: 57 y.o., male    PCP: Claretta Fraise, MD   Reason for Endocrinology Evaluation: Subclinical hypothyroidism     Date of Initial Endocrinology Evaluation: 01/13/2022    HPI: Mr. Dustin Balkcom Sr. is a 57 y.o. male with a past medical history of Hx of tonsillar cancer, OSA. The patient presented for initial endocrinology clinic visit on 01/13/2022 for consultative assistance with his Subclinical Hypothyroidism.   Patient has been diagnosed with right  tonsillar cancer (Sq. Cell carcinoma)  in June 2021, he is S/P right neck dissection, chemo/XRT  During evaluation for fatigue by oncology, the patient has been noted with an elevated TSH in August 2022, TSH max level 5.67 uIU/ml in January 2023, this has been attributed to prior radiation treatment of the neck  On his initial visit to our clinic he was started on levothyroxine due to a TSH of 9.99 u IU/mL with borderline free T4 at 0.65 NG/DL (01/2022)  No Fh of thyroid disease   SUBJECTIVE:    Today (05/19/22): Dustin Leiter Sr. is here for follow-up on hypothyroidism.  His weight has been gradually increasing  Denies local neck swelling  He is more tired than usual, that started after cancer treatment  Denies constipation or diarrhea  Denies palpitations  Has hand tremors  Has occasional anxiety but no depression  No LE edema    Levothyroxine 50 mcg daily HISTORY:  Past Medical History:  Past Medical History:  Diagnosis Date   Arthritis    Change in stool 02/22/2017   Family history of cancer    Family history of uterine cancer    Gout 02/22/2017   Hyperlipidemia    Sleep apnea    Uses CPAP   Tonsil cancer ([place]) DX'd 07/2019   metastatic   Past Surgical History:  Past Surgical History:  Procedure Laterality Date   CARPAL TUNNEL RELEASE Right 04/10/2019   CHOLECYSTECTOMY N/A 09/09/2020   Procedure: LAPAROSCOPIC CHOLECYSTECTOMY;  Surgeon: [name], MD;  [facility];  Service: General;  Laterality: N/A;   COLONOSCOPY N/A 02/20/2021   Procedure: COLONOSCOPY;  Surgeon: Rogene Houston, MD;  [facility];  Service: Endoscopy;  Laterality: N/A;  9:30   DIRECT LARYNGOSCOPY N/A 08/21/2019   Procedure: DIRECT LARYNGOSCOPY - WITH BX;  Surgeon: [name], MD;  Location: [place];  Service: ENT;  Laterality: N/A;   IR GASTROSTOMY TUBE MOD SED  10/13/2019   IR GASTROSTOMY TUBE REMOVAL  06/10/2020   IR IMAGING GUIDED PORT INSERTION  10/13/2019   IR REMOVAL TUN ACCESS W/ PORT W/O FL MOD SED  06/10/2020   NECK DISSECTION  09/11/2019   POLYPECTOMY  02/20/2021   Procedure: POLYPECTOMY;  Surgeon: Rogene Houston, MD;  [facility];  Service: Endoscopy;;   RADICAL NECK DISSECTION Right 09/11/2019   Procedure: RADICAL NECK DISSECTION;  Surgeon: [name], MD;  Location: [place];  Service: ENT;  Laterality: Right;   removal of right jugular vein in neck     TONSILLECTOMY Bilateral 08/21/2019   Procedure: TONSILLECTOMY;  Surgeon: [name], MD;  Location: [place];  Service: ENT;  Laterality: Bilateral;   UMBILICAL HERNIA REPAIR  09/09/2020   Procedure: REPAIR UMBILICAL HERNIA;  Surgeon: [name], MD;  Location: [facility];  Service: General;;   WISDOM TOOTH EXTRACTION      Social History:  reports that he  has never smoked. He has never used smokeless tobacco. He reports that he does not drink alcohol and does not use drugs. Family History: family history includes COPD in his mother; Cancer in his cousin and other family members; Cervical cancer in his maternal aunt; Lung cancer in his maternal grandfather, maternal grandmother, and another family member; Lung cancer (age of onset: 61) in his brother; Pancreatic cancer (age of onset: 26) in his cousin; Prostate cancer in his paternal grandfather; Stomach cancer in his paternal grandmother; Stomach cancer (age of onset: 58) in his  cousin; Uterine cancer (age of onset: 76) in his mother.   HOME MEDICATIONS: Allergies as of 05/20/2022   No Known Allergies      Medication List        Accurate as of May 19, 2022 12:41 PM. If you have any questions, ask your nurse or doctor.          Colchicine 0.6 MG Caps Commonly known as: Mitigare Take every two hours at onset of gout attack until pain resolves or 6 have been taken. Then decrease to two daily until flare ends What changed:  how much to take how to take this when to take this reasons to take this   fluticasone 50 MCG/ACT nasal spray Commonly known as: FLONASE Place 2 sprays into both nostrils daily as needed for allergies.   ibuprofen 200 MG tablet Commonly known as: ADVIL Take 600 mg by mouth every 8 (eight) hours as needed for fever, headache or mild pain.   levothyroxine 50 MCG tablet Commonly known as: SYNTHROID Take 1 tablet (50 mcg total) by mouth daily.   scopolamine 1 MG/3DAYS Commonly known as: Transderm Scop (1.5 MG) Place 1 patch (1.5 mg total) onto the skin every 3 (three) days.          REVIEW OF SYSTEMS: A comprehensive ROS was conducted with the patient and is negative except as per HPI     OBJECTIVE:  VS: There were no vitals taken for this visit.   Wt Readings from Last 3 Encounters:  01/13/22 286 lb (129.7 kg)  09/08/21 282 lb 6.4 oz (128.1 kg)  05/15/21 274 lb 3.2 oz (124.4 kg)     EXAM: General: Pt appears well and is in NAD  Eyes: External eye exam normal without stare, lid lag or exophthalmos.  EOM intact.    Neck: General: Supple without adenopathy. Thyroid: Thyroid size normal.  No goiter or nodules appreciated.   Lungs: Clear with good BS bilat with no rales, rhonchi, or wheezes  Heart: Auscultation: RRR.  Abdomen: Normoactive bowel sounds, soft, nontender, without masses or organomegaly palpable  Extremities:  BL LE: No pretibial edema normal ROM and strength.  Mental Status: Judgment, insight:  Intact Orientation: Oriented to time, place, and person Mood and affect: No depression, anxiety, or agitation     DATA REVIEWED:  Latest Reference Range & Units 01/13/22 09:18  TSH 0.35 - 5.50 uIU/mL 9.99 (H)  T4,Free(Direct) 0.60 - 1.60 ng/dL 0.65      ASSESSMENT/PLAN/RECOMMENDATIONS:   Hypothyroidism:  -Patient has been noted with fatigue and weight gain -His TSH has trended up to 9.99 uIU/mL -I have recommended starting LT-for replacement, which he is in agreement of -- Pt educated extensively on the correct way to take levothyroxine (first thing in the morning with water, 30 minutes before eating or taking other medications). - Pt encouraged to double dose the following day if she were to miss a dose given long half-life  of levothyroxine. - TPO Ab's pending   Medications : Start levothyroxine 50 mcg daily    Follow-up in 4 months   Signed electronically by: Mack Guise, MD  [facility]  [facility] [address], [place],  [address] Phone: [phone] FAX: [phone]   CC: Claretta Fraise, MD [place] [address] Phone: [phone] Fax: [phone]   Return to Endocrinology clinic as below: Future Appointments  Date Time Provider [place]  05/20/2022  7:50 AM Sallyanne Birkhead, Melanie Crazier, MD [facility] None  05/20/2022  1:30 PM [facility] None  05/20/2022  2:00 PM Brunetta Genera, MD [facility] None  04/28/2023  8:50 AM McKenzie, Candee Furbish, MD [facility] None

## 2022-05-20 ENCOUNTER — Other Ambulatory Visit: Payer: Self-pay

## 2022-05-20 ENCOUNTER — Ambulatory Visit: Payer: 59 | Admitting: Hematology

## 2022-05-20 ENCOUNTER — Other Ambulatory Visit: Payer: 59

## 2022-05-20 ENCOUNTER — Inpatient Hospital Stay: Payer: Medicare Other | Attending: Nurse Practitioner

## 2022-05-20 ENCOUNTER — Ambulatory Visit: Payer: Medicare Other | Admitting: Internal Medicine

## 2022-05-20 ENCOUNTER — Encounter: Payer: Self-pay | Admitting: Internal Medicine

## 2022-05-20 ENCOUNTER — Inpatient Hospital Stay: Payer: Medicare Other | Admitting: Hematology

## 2022-05-20 VITALS — BP 133/92 | HR 97 | Temp 97.9°F | Resp 20 | Wt 302.0 lb

## 2022-05-20 VITALS — BP 126/82 | HR 84 | Ht 68.0 in | Wt 301.6 lb

## 2022-05-20 DIAGNOSIS — Z85818 Personal history of malignant neoplasm of other sites of lip, oral cavity, and pharynx: Secondary | ICD-10-CM | POA: Insufficient documentation

## 2022-05-20 DIAGNOSIS — R946 Abnormal results of thyroid function studies: Secondary | ICD-10-CM | POA: Diagnosis not present

## 2022-05-20 DIAGNOSIS — C099 Malignant neoplasm of tonsil, unspecified: Secondary | ICD-10-CM

## 2022-05-20 DIAGNOSIS — R7989 Other specified abnormal findings of blood chemistry: Secondary | ICD-10-CM

## 2022-05-20 DIAGNOSIS — E039 Hypothyroidism, unspecified: Secondary | ICD-10-CM | POA: Diagnosis not present

## 2022-05-20 LAB — CBC WITH DIFFERENTIAL (CANCER CENTER ONLY)
Abs Immature Granulocytes: 0.02 10*3/uL (ref 0.00–0.07)
Basophils Absolute: 0 10*3/uL (ref 0.0–0.1)
Basophils Relative: 1 %
Eosinophils Absolute: 0.2 10*3/uL (ref 0.0–0.5)
Eosinophils Relative: 2 %
HCT: 45.9 % (ref 39.0–52.0)
Hemoglobin: 15.8 g/dL (ref 13.0–17.0)
Immature Granulocytes: 0 %
Lymphocytes Relative: 16 %
Lymphs Abs: 1 10*3/uL (ref 0.7–4.0)
MCH: 30.3 pg (ref 26.0–34.0)
MCHC: 34.4 g/dL (ref 30.0–36.0)
MCV: 87.9 fL (ref 80.0–100.0)
Monocytes Absolute: 0.6 10*3/uL (ref 0.1–1.0)
Monocytes Relative: 10 %
Neutro Abs: 4.6 10*3/uL (ref 1.7–7.7)
Neutrophils Relative %: 71 %
Platelet Count: 178 10*3/uL (ref 150–400)
RBC: 5.22 MIL/uL (ref 4.22–5.81)
RDW: 13.2 % (ref 11.5–15.5)
WBC Count: 6.4 10*3/uL (ref 4.0–10.5)
nRBC: 0 % (ref 0.0–0.2)

## 2022-05-20 LAB — CMP (CANCER CENTER ONLY)
ALT: 20 U/L (ref 0–44)
AST: 19 U/L (ref 15–41)
Albumin: 4.1 g/dL (ref 3.5–5.0)
Alkaline Phosphatase: 60 U/L (ref 38–126)
Anion gap: 5 (ref 5–15)
BUN: 21 mg/dL — ABNORMAL HIGH (ref 6–20)
CO2: 31 mmol/L (ref 22–32)
Calcium: 9.3 mg/dL (ref 8.9–10.3)
Chloride: 102 mmol/L (ref 98–111)
Creatinine: 1.37 mg/dL — ABNORMAL HIGH (ref 0.61–1.24)
GFR, Estimated: >60 mL/min (ref >60)
Glucose, Bld: 79 mg/dL (ref 70–99)
Potassium: 4 mmol/L (ref 3.5–5.1)
Sodium: 138 mmol/L (ref 135–145)
Total Bilirubin: 0.6 mg/dL (ref 0.3–1.2)
Total Protein: 6.9 g/dL (ref 6.5–8.1)

## 2022-05-20 LAB — T4, FREE: Free T4: 0.78 ng/dL (ref 0.61–1.12)

## 2022-05-20 LAB — TSH: TSH: 4.19 u[IU]/mL (ref 0.350–4.500)

## 2022-05-20 NOTE — Patient Instructions (Signed)

## 2022-05-20 NOTE — Progress Notes (Signed)
HEMATOLOGY/ONCOLOGY CLINIC NOTE  Date of Service: 05/20/22   Patient Care Team: Claretta Fraise, MD as PCP - General (Family Medicine) Jodi Marble, MD as Consulting Physician (Otolaryngology) Eppie Gibson, MD as Attending Physician (Radiation Oncology) Tish Men, MD (Inactive) as Consulting Physician (Hematology) Malmfelt, Stephani Police, RN as Oncology Nurse Navigator (Oncology)  REFERRING PHYSICIAN: Claretta Fraise, MD  CHIEF COMPLAINTS/PURPOSE OF CONSULTATION:  Follow-up for continued surveillance of squamous cell carcinoma of the head and neck  HISTORY OF PRESENTING ILLNESS:   Please see previous note for details on initial presentation Oncologic history   . Oncology History  Squamous cell carcinoma of head and neck (HCC)  07/13/2019 Imaging   CT neck: IMPRESSION: 4.1 x 3.2 cm necrotic/cystic node or nodal conglomerate at the right level II station likely reflecting nodal metastatic disease. No definite primary mass is identified within the oral cavity, pharynx or larynx. Correlate with direct visualization and consider direct tissue sampling. Additionally, PET-CT may be helpful.   Cervical spondylosis as described. A prominent C6-C7 posterior disc osteophyte contributes to at least moderate bony spinal canal stenosis. Multilevel neural foraminal narrowing.   07/26/2019 Pathology Results   FINAL MICROSCOPIC DIAGNOSIS:   A. RIGHT CERVICAL LYMPH NODE, NEEDLE CORE BIOPSY:  - Squamous cell carcinoma, basaloid.  - No distinct nodal tissue identified.   P16 immunohistochemistry is POSITIVE.    08/10/2019 Initial Diagnosis   Squamous cell carcinoma of head and neck ([place])   08/10/2019 Imaging   PET:  IMPRESSION: 1. The 2.8 cm right level IIa lymph node has a maximum SUV of 28.4. A primary lesion is not readily seen. There is some asymmetric palatine tonsillar activity with the left tonsil having maximum SUV of 12.4 and the right 7.8, but no tonsillar mass is observed  on the CT data. 2. No compelling findings of active malignancy in the chest, abdomen/pelvis, or skeleton. There is some accentuated anal activity which is likely physiologic given the lack of visible abnormality on the CT data. 3. Other imaging findings of potential clinical significance: Diffuse hepatic steatosis. Hypodense left renal lesion is probably a cyst but technically too small to characterize.   08/11/2019 Cancer Staging   Staging form: Pharynx - HPV-Mediated Oropharynx, AJCC 8th Edition - Clinical stage from 08/11/2019: Stage I (cT0, cN1, cM0, p16+) - Signed by Eppie Gibson, MD on 08/12/2019   Squamous cell carcinoma of right tonsil ([place]) (Resolved)  09/04/2019 Initial Diagnosis   Squamous cell carcinoma of right tonsil ([place])   10/10/2019 Cancer Staging   Staging form: Pharynx - HPV-Mediated Oropharynx, AJCC 8th Edition - Pathologic stage from 10/10/2019: Stage I (pT1, pN1, cM0, p16+) - Signed by Eppie Gibson, MD on 10/11/2019   Malignant neoplasm of tonsillar fossa ([place])  09/11/2019 Initial Diagnosis   Malignant neoplasm of tonsillar fossa ([place])   10/10/2019 Cancer Staging   Staging form: Pharynx - HPV-Mediated Oropharynx, AJCC 8th Edition - Pathologic stage from 10/10/2019: Stage I (pT1, pN1, cM0, p16+) - Signed by Eppie Gibson, MD on 10/11/2019   10/20/2019 - 11/10/2019 Chemotherapy   Patient is on Treatment Plan : HEAD/NECK Cisplatin q7d         INTERVAL HISTORY:  Dustin Leiter Sr. is here for continued evaluation and management of his head and neck squamous cell carcinoma.  Patient was last seen by me on 05/15/2021 and he was doing well overall.   Patient has been doing well overall without any new medical concerns since our last visit. He denies any changes in swallowing  food. He reports of occasional choking on food.  Patient denies fever, chills, night sweats, fatigue, new lumps/bumps, chest pain, back pain, or leg swelling.   He continues to follow up with his ENT  physician, Endocrinologist, and his PCP.  He notes he has retired since our last visit.    MEDICAL HISTORY:  Past Medical History:  Diagnosis Date   Arthritis    Change in stool 02/22/2017   Family history of cancer    Family history of uterine cancer    Gout 02/22/2017   Hyperlipidemia    Sleep apnea    Uses CPAP   Tonsil cancer ([place]) DX'd 07/2019   metastatic     SURGICAL HISTORY: Past Surgical History:  Procedure Laterality Date   CARPAL TUNNEL RELEASE Right 04/10/2019   CHOLECYSTECTOMY N/A 09/09/2020   Procedure: LAPAROSCOPIC CHOLECYSTECTOMY;  Surgeon: [name], MD;  Location: [facility];  Service: General;  Laterality: N/A;   COLONOSCOPY N/A 02/20/2021   Procedure: COLONOSCOPY;  Surgeon: Rogene Houston, MD;  Location: [facility];  Service: Endoscopy;  Laterality: N/A;  9:30   DIRECT LARYNGOSCOPY N/A 08/21/2019   Procedure: DIRECT LARYNGOSCOPY - WITH BX;  Surgeon: [name], MD;  Location: [place];  Service: ENT;  Laterality: N/A;   IR GASTROSTOMY TUBE MOD SED  10/13/2019   IR GASTROSTOMY TUBE REMOVAL  06/10/2020   IR IMAGING GUIDED PORT INSERTION  10/13/2019   IR REMOVAL TUN ACCESS W/ PORT W/O FL MOD SED  06/10/2020   NECK DISSECTION  09/11/2019   POLYPECTOMY  02/20/2021   Procedure: POLYPECTOMY;  Surgeon: Rogene Houston, MD;  Location: [facility];  Service: Endoscopy;;   RADICAL NECK DISSECTION Right 09/11/2019   Procedure: RADICAL NECK DISSECTION;  Surgeon: [name], MD;  Location: [place];  Service: ENT;  Laterality: Right;   removal of right jugular vein in neck     TONSILLECTOMY Bilateral 08/21/2019   Procedure: TONSILLECTOMY;  Surgeon: [name], MD;  Location: [place];  Service: ENT;  Laterality: Bilateral;   UMBILICAL HERNIA REPAIR  09/09/2020   Procedure: REPAIR UMBILICAL HERNIA;  Surgeon: [name], MD;  Location: [facility];  Service: General;;   WISDOM TOOTH EXTRACTION       SOCIAL HISTORY: Social  History   Socioeconomic History   Marital status: Married    Spouse name: Not on file   Number of children: 1   Years of education: Not on file   Highest education level: Not on file  Occupational History   Occupation: electrician  Tobacco Use   Smoking status: Never   Smokeless tobacco: Never  Vaping Use   Vaping Use: Never used  Substance and Sexual Activity   Alcohol use: No   Drug use: No   Sexual activity: Not on file  Other Topics Concern   Not on file  Social History Narrative   Patient is married and has 1 son.   Patient has never smoked, used tobacco products.   Patient does not drink.  Patient does not use illicit drugs.   Social Determinants of Health   Financial Resource Strain: Not on file  Food Insecurity: Not on file  Transportation Needs: Not on file  Physical Activity: Not on file  Stress: Not on file  Social Connections: Not on file  Intimate Partner Violence: Not on file     FAMILY HISTORY: Family History  Problem Relation Age of Onset   COPD Mother  Uterine cancer Mother 21   Lung cancer Brother 33       smoker   Lung cancer Maternal Grandfather    Stomach cancer Paternal Grandmother        d. 2s   Cervical cancer Maternal Aunt    Lung cancer Maternal Grandmother        smoker   Prostate cancer Paternal Grandfather    Pancreatic cancer Cousin 76       mother's maternal first cousin   Stomach cancer Cousin 65       mother's maternal first cousin   Cancer Cousin        mother's maternal first cousin with NOS cancer   Cancer Other        MGMs sister with cancer NOS   Lung cancer Other        smoker   Cancer Other        mother's maternal grandfather with NOS cancer     ALLERGIES:   has No Known Allergies.   MEDICATIONS:  Current Outpatient Medications  Medication Sig Dispense Refill   Colchicine (MITIGARE) 0.6 MG CAPS Take every two hours at onset of gout attack until pain resolves or 6 have been taken. Then decrease to two  daily until flare ends (Patient taking differently: Take 0.6 mg by mouth daily as needed (gout). Take every two hours at onset of gout attack until pain resolves or 6 have been taken. Then decrease to two daily until flare ends) 30 capsule 11   fluticasone (FLONASE) 50 MCG/ACT nasal spray Place 2 sprays into both nostrils daily as needed for allergies.     ibuprofen (ADVIL) 200 MG tablet Take 600 mg by mouth every 8 (eight) hours as needed for fever, headache or mild pain.     levothyroxine (SYNTHROID) 50 MCG tablet Take 1 tablet (50 mcg total) by mouth daily. 90 tablet 3   scopolamine (TRANSDERM SCOP, 1.5 MG,) 1 MG/3DAYS Place 1 patch (1.5 mg total) onto the skin every 3 (three) days. (Patient not taking: Reported on 05/20/2022) 4 patch 0   No current facility-administered medications for this visit.   Facility-Administered Medications Ordered in Other Visits  Medication Dose Route Frequency Provider Last Rate Last Admin   0.9 %  sodium chloride infusion   Intravenous Continuous Monia Sabal, PA-C         REVIEW OF SYSTEMS:   .10 Point review of Systems was done is negative except as noted above.   PHYSICAL EXAMINATION: ECOG PERFORMANCE STATUS: 1 - Symptomatic but completely ambulatory  Vitals:   05/20/22 1355  BP: (!) 133/92  Pulse: 97  Resp: 20  Temp: 97.9 F (36.6 C)  SpO2: 97%     Filed Weights   05/20/22 1355  Weight: (!) 302 lb (137 kg)     Body mass index is 45.92 kg/m.  Marland Kitchen GENERAL:alert, in no acute distress and comfortable SKIN: no acute rashes, no significant lesions EYES: conjunctiva are pink and non-injected, sclera anicteric OROPHARYNX: MMM, no exudates, no oropharyngeal erythema or ulceration NECK: supple, no JVD LYMPH:  no palpable lymphadenopathy in the cervical, axillary or inguinal regions LUNGS: clear to auscultation b/l with normal respiratory effort HEART: regular rate & rhythm ABDOMEN:  normoactive bowel sounds , non tender, not  distended. Extremity: no pedal edema PSYCH: alert & oriented x 3 with fluent speech NEURO: no focal motor/sensory deficits  LABORATORY DATA:  I have reviewed the data as listed     Latest Ref Rng &  Units 05/20/2022    1:35 PM 05/15/2021    1:29 PM 12/12/2020    8:26 AM  CBC  WBC 4.0 - 10.5 K/uL 6.4  5.6  4.8   Hemoglobin 13.0 - 17.0 g/dL 15.8  15.6  14.9   Hematocrit 39.0 - 52.0 % 45.9  45.8  43.5   Platelets 150 - 400 K/uL 178  178  169        Latest Ref Rng & Units 05/20/2022    1:35 PM 05/15/2021    1:29 PM 12/12/2020    8:26 AM  CMP  Glucose 70 - 99 mg/dL 79  108  96   BUN 6 - 20 mg/dL '21  20  18   '$ Creatinine 0.61 - 1.24 mg/dL 1.37  1.34  1.23   Sodium 135 - 145 mmol/L 138  138  139   Potassium 3.5 - 5.1 mmol/L 4.0  4.2  4.4   Chloride 98 - 111 mmol/L 102  103  104   CO2 22 - 32 mmol/L '31  30  28   '$ Calcium 8.9 - 10.3 mg/dL 9.3  8.9  8.9   Total Protein 6.5 - 8.1 g/dL 6.9  6.9  6.8   Total Bilirubin 0.3 - 1.2 mg/dL 0.6  0.4  0.8   Alkaline Phos 38 - 126 U/L 60  63  59   AST 15 - 41 U/L '19  17  20   '$ ALT 0 - 44 U/L '20  18  17    '$ Component     Latest Ref Rng 05/20/2022  T4,Free(Direct)     0.61 - 1.12 ng/dL 0.78   TSH     0.350 - 4.500 uIU/mL 4.190      RADIOGRAPHIC STUDIES: I have personally reviewed the radiological images as listed and agreed with the findings in the report.  PET/CT 08/10/2019: IMPRESSION: 1. The 2.8 cm right level IIa lymph node has a maximum SUV of 28.4. A primary lesion is not readily seen. There is some asymmetric palatine tonsillar activity with the left tonsil having maximum SUV of 12.4 and the right 7.8, but no tonsillar mass is observed on the CT data. 2. No compelling findings of active malignancy in the chest, abdomen/pelvis, or skeleton. There is some accentuated anal activity which is likely physiologic given the lack of visible abnormality on the CT data. 3. Other imaging findings of potential clinical significance: Diffuse hepatic  steatosis. Hypodense left renal lesion is probably a cyst but technically too small to characterize.     Electronically Signed   By: Van Clines M.D.   On: 08/10/2019 15:54    08/21/19 of Surgical Pathology [id number]    SURGICAL PATHOLOGY  * THIS IS AN AMENDED REPORT   CASE: [id number]  PATIENT: Dustin Rios  Surgical Pathology Report   Amendment    Reason for Amendment #1: Other   Clinical History: squamous cell of neck and head (cm)     FINAL MICROSCOPIC DIAGNOSIS:   A. TONSIL, RIGHT:  - Squamous cell carcinoma, basaloid, p16 positive.  - Margins not involved.  - Carcinoma focally 1 mm from surgical margin.  - See oncology table and comment.   B. TONSIL, LEFT:  - Benign tonsil.  - No malignancy identified.   C. TONGUE, RIGHT BASE, BIOPSY:  - Benign tonsillar tissue.  - No malignancy identified.   D. TONGUE, LEFT BASE, BIOPSY:  - Benign tonsillar tissue.  - No malignancy identified.   ONCOLOGY TABLE:  PHARYNX (OROPHARYNX,  HYPOPHARYNX, NASOPHARYNX):  Procedure: Right and left tonsillectomy and right and left tongue base  biopsies.  Tumor Site: Right tonsil.  Tumor Laterality: Right tonsil.  Tumor Focality: Unifocal.  Tumor Size: 1 cm x 0.3 cm (3 mm) depth.  Histologic Type: Squamous cell carcinoma, basaloid type, p16 positive,  EBV negative.  Histologic Grade: Poorly differentiated.  Margins: Free of tumor.  Carcinoma focally 1 mm from the nearest  surgical margin.       Distance to closest Margin: 1 mm.  Lymphovascular Invasion: Present.  Perineural Invasion: Not identified.  Regional Lymph Nodes: No lymph nodes submitted with this case.  The  previous right neck lymph node needle core biopsy ([facility] 20 [phone]) shows  squamous cell carcinoma.  Pathologic Stage Classification (pTNM, AJCC 8th Edition): pT1, pN1, see  comment.    09/11/19 of Surgical Pathology Report [id number])  COMMENT:   Metastatic carcinoma is present in 3  lymph nodes - 1 level 1 lymph node,  1 level 2 lymph node (this metastatic focus also involves skeletal  muscle), and 1 level 3 lymph node (highlighted by a cytokeratin  immunohistochemical stain).  Additional studies can be performed (block  A11) upon clinician request.    ASSESSMENT & PLAN:   Dustin Leiter Sr. is a 57 y.o. male with:  Rt Tonsilar Basaloid Squamous cell carcinoma , HPV+ve. PT1,pN1,M0. 3/49 LN+ve High risk features - LVI, extranodal extension, high risk morphology. Significant second hand smoke exposure S/p b/l tonsillectomy on 08/21/2019 S/p rt radical neck dissection PLAN: -Discussed patient's labs from today CBC and CMP stable. -CT chest 04/23/2021 with no evidence of metastatic disease to the chest -Continue follow-up with Dr. Constance Holster for continued local evaluation for squamous cell carcinoma recurrence assessment every 3 to 6 months as as per his recommendations.  2.  Elevated TSH likely mild hypothyroidism from neck radiation  PLAN: -Discussed lab results from today, 05/20/2022, with the patient. CBC is stable. CMP stable other than slightly increased creatinine of 1.37. Thyroid labs reviewed. -patient has no clinical symptoms or findings suggestive of recurrence of his tonsillar carcinoma. -continue f/u with ENT as per their recommendation for local ENT monitoring for recurrences -Recommended influenza vaccine and COVID-19 Booster.  RTC with Dr Irene Limbo as needed  FOLLOW-UP: RTC with Dr Irene Limbo as needed  The total time spent in the appointment was 20 minutes* .  All of the patient's questions were answered with apparent satisfaction. The patient knows to call the clinic with any problems, questions or concerns.   Sullivan Lone MD MS AAHIVMS [facility] Hematology/Oncology Physician [facility]  .*Total Encounter Time as defined by the Centers for Medicare and Medicaid Services includes, in addition to the face-to-face time of a patient visit  (documented in the note above) non-face-to-face time: obtaining and reviewing outside history, ordering and reviewing medications, tests or procedures, care coordination (communications with other health care professionals or caregivers) and documentation in the medical record.   I, Cleda Mccreedy, am acting as a Education administrator for Sullivan Lone, MD.  .I have reviewed the above documentation for accuracy and completeness, and I agree with the above. Brunetta Genera MD

## 2022-05-21 ENCOUNTER — Encounter: Payer: Self-pay | Admitting: Internal Medicine

## 2022-05-21 LAB — THYROID PEROXIDASE ANTIBODY: Thyroperoxidase Ab SerPl-aCnc: 12 IU/mL (ref 0–34)

## 2022-05-21 LAB — T3: T3, Total: 135 ng/dL (ref 71–180)

## 2022-05-21 MED ORDER — LEVOTHYROXINE SODIUM 75 MCG PO TABS: 75.0000 ug | ORAL_TABLET | Freq: Every day | ORAL | 3 refills | Status: DC

## 2022-05-26 ENCOUNTER — Encounter: Payer: Self-pay | Admitting: Hematology

## 2022-08-05 ENCOUNTER — Encounter: Payer: Self-pay | Admitting: Family Medicine

## 2022-08-05 ENCOUNTER — Ambulatory Visit: Payer: Medicare Other | Admitting: Family Medicine

## 2022-08-05 VITALS — BP 136/87 | HR 95 | Temp 97.5°F | Ht 68.0 in | Wt 297.4 lb

## 2022-08-05 DIAGNOSIS — J069 Acute upper respiratory infection, unspecified: Secondary | ICD-10-CM | POA: Diagnosis not present

## 2022-08-05 MED ORDER — PREDNISONE 20 MG PO TABS: 40.0000 mg | ORAL_TABLET | Freq: Every day | ORAL | 0 refills | Status: AC

## 2022-08-05 MED ORDER — AMOXICILLIN-POT CLAVULANATE 875-125 MG PO TABS: 1.0000 | ORAL_TABLET | Freq: Two times a day (BID) | ORAL | 0 refills | Status: AC

## 2022-08-05 NOTE — Progress Notes (Signed)
Subjective:  Patient ID: Dustin Leiter Sr., male    DOB: 03-05-1966, 57 y.o.   MRN: [id number]  Patient Care Team: Claretta Fraise, MD as PCP - General (Family Medicine) Jodi Marble, MD as Consulting Physician (Otolaryngology) Eppie Gibson, MD as Attending Physician (Radiation Oncology) Tish Men, MD (Inactive) as Consulting Physician (Hematology) Malmfelt, Stephani Police, RN as Oncology Nurse Navigator (Oncology)   Chief Complaint:  sneezing and scraty throat (X 2 days )   HPI: Dustin Dambrosi Sr. is a 57 y.o. male presenting on 08/05/2022 for sneezing and scraty throat (X 2 days )   URI  This is a new problem. The current episode started in the past 7 days. The problem has been gradually worsening. There has been no fever. Associated symptoms include congestion, coughing, a plugged ear sensation, rhinorrhea, sneezing and a sore throat. Pertinent negatives include no abdominal pain, chest pain, diarrhea, dysuria, ear pain, headaches, joint pain, joint swelling, nausea, neck pain, rash, sinus pain, swollen glands, vomiting or wheezing. He has tried antihistamine for the symptoms. The treatment provided no relief.     Relevant past medical, surgical, family, and social history reviewed and updated as indicated.  Allergies and medications reviewed and updated. Data reviewed: Chart in Epic.   Past Medical History:  Diagnosis Date   Arthritis    Change in stool 02/22/2017   Family history of cancer    Family history of uterine cancer    Gout 02/22/2017   Hyperlipidemia    Sleep apnea    Uses CPAP   Tonsil cancer ([place]) DX'd 07/2019   metastatic    Past Surgical History:  Procedure Laterality Date   CARPAL TUNNEL RELEASE Right 04/10/2019   CHOLECYSTECTOMY N/A 09/09/2020   Procedure: LAPAROSCOPIC CHOLECYSTECTOMY;  Surgeon: [name], MD;  Location: [facility];  Service: General;  Laterality: N/A;   COLONOSCOPY N/A 02/20/2021   Procedure: COLONOSCOPY;  Surgeon: Rogene Houston, MD;  [facility];  Service: Endoscopy;  Laterality: N/A;  9:30   DIRECT LARYNGOSCOPY N/A 08/21/2019   Procedure: DIRECT LARYNGOSCOPY - WITH BX;  Surgeon: [name], MD;  Location: [facility];  Service: ENT;  Laterality: N/A;   IR GASTROSTOMY TUBE MOD SED  10/13/2019   IR GASTROSTOMY TUBE REMOVAL  06/10/2020   IR IMAGING GUIDED PORT INSERTION  10/13/2019   IR REMOVAL TUN ACCESS W/ PORT W/O FL MOD SED  06/10/2020   NECK DISSECTION  09/11/2019   POLYPECTOMY  02/20/2021   Procedure: POLYPECTOMY;  Surgeon: Rogene Houston, MD;  [facility];  Service: Endoscopy;;   RADICAL NECK DISSECTION Right 09/11/2019   Procedure: RADICAL NECK DISSECTION;  Surgeon: [name], MD;  Location: [place];  Service: ENT;  Laterality: Right;   removal of right jugular vein in neck     TONSILLECTOMY Bilateral 08/21/2019   Procedure: TONSILLECTOMY;  Surgeon: [name], MD;  Location: [facility];  Service: ENT;  Laterality: Bilateral;   UMBILICAL HERNIA REPAIR  09/09/2020   Procedure: REPAIR UMBILICAL HERNIA;  Surgeon: [name], MD;  Location: [facility];  Service: General;;   WISDOM TOOTH EXTRACTION      Social History   Socioeconomic History   Marital status: Married    Spouse name: Not on file   Number of children: 1   Years of education: Not on file   Highest education level: Not on file  Occupational History   Occupation: electrician  Tobacco Use  Smoking status: Never   Smokeless tobacco: Never  Vaping Use   Vaping Use: Never used  Substance and Sexual Activity   Alcohol use: No   Drug use: No   Sexual activity: Not on file  Other Topics Concern   Not on file  Social History Narrative   Patient is married and has 1 son.   Patient has never smoked, used tobacco products.   Patient does not drink.  Patient does not use illicit drugs.   Social Determinants of Health   Financial Resource Strain: Not on file  Food Insecurity: Not  on file  Transportation Needs: Not on file  Physical Activity: Not on file  Stress: Not on file  Social Connections: Not on file  Intimate Partner Violence: Not on file    Outpatient Encounter Medications as of 08/05/2022  Medication Sig   amoxicillin-clavulanate (AUGMENTIN) 875-125 MG tablet Take 1 tablet by mouth 2 (two) times daily for 10 days.   Colchicine (MITIGARE) 0.6 MG CAPS Take every two hours at onset of gout attack until pain resolves or 6 have been taken. Then decrease to two daily until flare ends (Patient taking differently: Take 0.6 mg by mouth daily as needed (gout). Take every two hours at onset of gout attack until pain resolves or 6 have been taken. Then decrease to two daily until flare ends)   fluticasone (FLONASE) 50 MCG/ACT nasal spray Place 2 sprays into both nostrils daily as needed for allergies.   ibuprofen (ADVIL) 200 MG tablet Take 600 mg by mouth every 8 (eight) hours as needed for fever, headache or mild pain.   levothyroxine (SYNTHROID) 75 MCG tablet Take 1 tablet (75 mcg total) by mouth daily.   loratadine (CLARITIN) 10 MG tablet Take 10 mg by mouth daily.   predniSONE (DELTASONE) 20 MG tablet Take 2 tablets (40 mg total) by mouth daily with breakfast for 5 days.   scopolamine (TRANSDERM SCOP, 1.5 MG,) 1 MG/3DAYS Place 1 patch (1.5 mg total) onto the skin every 3 (three) days. (Patient not taking: Reported on 08/05/2022)   Facility-Administered Encounter Medications as of 08/05/2022  Medication   0.9 %  sodium chloride infusion    No Known Allergies  Review of Systems  Constitutional:  Negative for activity change, appetite change, chills, diaphoresis, fatigue, fever and unexpected weight change.  HENT:  Positive for congestion, postnasal drip, rhinorrhea, sneezing and sore throat. Negative for dental problem, drooling, ear discharge, ear pain, facial swelling, hearing loss, mouth sores, nosebleeds, sinus pressure, sinus pain, tinnitus, trouble swallowing  and voice change.   Respiratory:  Positive for cough. Negative for apnea, choking, chest tightness, shortness of breath, wheezing and stridor.   Cardiovascular:  Negative for chest pain.  Gastrointestinal:  Negative for abdominal pain, diarrhea, nausea and vomiting.  Genitourinary:  Negative for decreased urine volume, difficulty urinating and dysuria.  Musculoskeletal:  Negative for arthralgias, joint pain, myalgias and neck pain.  Skin:  Negative for rash.  Neurological:  Negative for dizziness, tremors, seizures, syncope, facial asymmetry, speech difficulty, weakness, light-headedness, numbness and headaches.  Psychiatric/Behavioral:  Negative for confusion.   All other systems reviewed and are negative.       Objective:  BP 136/87   Pulse 95   Temp (!) 97.5 F (36.4 C) (Temporal)   Ht 5\' 8"  (1.727 m)   Wt 297 lb 6.4 oz (134.9 kg)   SpO2 94%   BMI 45.22 kg/m    Wt Readings from Last 3 Encounters:  08/05/22  297 lb 6.4 oz (134.9 kg)  05/20/22 (!) 302 lb (137 kg)  05/20/22 (!) 301 lb 9.6 oz (136.8 kg)    Physical Exam Vitals and nursing note reviewed.  Constitutional:      General: He is not in acute distress.    Appearance: Normal appearance. He is obese. He is not ill-appearing, toxic-appearing or diaphoretic.  HENT:     Head: Atraumatic.     Comments: Right lateral neck scaring from surgery, well healed. Fibrosis of right neck.    Right Ear: Hearing and ear canal normal. A middle ear effusion is present. Tympanic membrane is not erythematous.     Left Ear: Hearing and ear canal normal. A middle ear effusion is present. Tympanic membrane is not erythematous.     Nose: Congestion present.     Right Sinus: No maxillary sinus tenderness or frontal sinus tenderness.     Left Sinus: No maxillary sinus tenderness or frontal sinus tenderness.     Mouth/Throat:     Lips: Pink.     Mouth: Mucous membranes are moist.     Tongue: No lesions.     Pharynx: Posterior oropharyngeal  erythema present. No pharyngeal swelling or oropharyngeal exudate.  Eyes:     Extraocular Movements: Extraocular movements intact.     Conjunctiva/sclera: Conjunctivae normal.     Pupils: Pupils are equal, round, and reactive to light.  Cardiovascular:     Rate and Rhythm: Normal rate and regular rhythm.  Pulmonary:     Effort: Pulmonary effort is normal.     Breath sounds: Normal breath sounds.  Musculoskeletal:     Right lower leg: No edema.     Left lower leg: No edema.  Skin:    General: Skin is warm and dry.     Capillary Refill: Capillary refill takes less than 2 seconds.  Neurological:     General: No focal deficit present.     Mental Status: He is alert and oriented to person, place, and time.  Psychiatric:        Mood and Affect: Mood normal.        Behavior: Behavior normal.        Thought Content: Thought content normal.        Judgment: Judgment normal.     Results for orders placed or performed in visit on 05/20/22  T3  Result Value Ref Range   T3, Total 135 71 - 180 ng/dL  Thyroid peroxidase antibody  Result Value Ref Range   Thyroperoxidase Ab SerPl-aCnc 12 0 - 34 IU/mL  T4, free  Result Value Ref Range   Free T4 0.78 0.61 - 1.12 ng/dL  TSH  Result Value Ref Range   TSH 4.190 0.350 - 4.500 uIU/mL  CMP (Cancer Center only)  Result Value Ref Range   Sodium 138 135 - 145 mmol/L   Potassium 4.0 3.5 - 5.1 mmol/L   Chloride 102 98 - 111 mmol/L   CO2 31 22 - 32 mmol/L   Glucose, Bld 79 70 - 99 mg/dL   BUN 21 (H) 6 - 20 mg/dL   Creatinine 1.37 (H) 0.61 - 1.24 mg/dL   Calcium 9.3 8.9 - 10.3 mg/dL   Total Protein 6.9 6.5 - 8.1 g/dL   Albumin 4.1 3.5 - 5.0 g/dL   AST 19 15 - 41 U/L   ALT 20 0 - 44 U/L   Alkaline Phosphatase 60 38 - 126 U/L   Total Bilirubin 0.6 0.3 - 1.2 mg/dL  GFR, Estimated >60 >60 mL/min   Anion gap 5 5 - 15  CBC with Differential (Cancer Center Only)  Result Value Ref Range   WBC Count 6.4 4.0 - 10.5 K/uL   RBC 5.22 4.22 - 5.81  MIL/uL   Hemoglobin 15.8 13.0 - 17.0 g/dL   HCT 45.9 39.0 - 52.0 %   MCV 87.9 80.0 - 100.0 fL   MCH 30.3 26.0 - 34.0 pg   MCHC 34.4 30.0 - 36.0 g/dL   RDW 13.2 11.5 - 15.5 %   Platelet Count 178 150 - 400 K/uL   nRBC 0.0 0.0 - 0.2 %   Neutrophils Relative % 71 %   Neutro Abs 4.6 1.7 - 7.7 K/uL   Lymphocytes Relative 16 %   Lymphs Abs 1.0 0.7 - 4.0 K/uL   Monocytes Relative 10 %   Monocytes Absolute 0.6 0.1 - 1.0 K/uL   Eosinophils Relative 2 %   Eosinophils Absolute 0.2 0.0 - 0.5 K/uL   Basophils Relative 1 %   Basophils Absolute 0.0 0.0 - 0.1 K/uL   Immature Granulocytes 0 %   Abs Immature Granulocytes 0.02 0.00 - 0.07 K/uL       Pertinent labs & imaging results that were available during my care of the patient were reviewed by me and considered in my medical decision making.  Assessment & Plan:  Dustin Rios was seen today for sneezing and scraty throat.  Diagnoses and all orders for this visit:  URI with cough and congestion Aware to continue Flonase, change Claritin to Zyrtec. Will burst with steroids. Pocket script for Augmentin prescribed. Aware of indications warranting antibiotic therapy. Symptomatic care discussed in detail. Aware to report new, worsening, or persistent symptoms.  -     predniSONE (DELTASONE) 20 MG tablet; Take 2 tablets (40 mg total) by mouth daily with breakfast for 5 days. -     amoxicillin-clavulanate (AUGMENTIN) 875-125 MG tablet; Take 1 tablet by mouth 2 (two) times daily for 10 days.     Continue all other maintenance medications.  Follow up plan: Return if symptoms worsen or fail to improve.   Continue healthy lifestyle choices, including diet (rich in fruits, vegetables, and lean proteins, and low in salt and simple carbohydrates) and exercise (at least 30 minutes of moderate physical activity daily).   The above assessment and management plan was discussed with the patient. The patient verbalized understanding of and has agreed to the  management plan. Patient is aware to call the clinic if they develop any new symptoms or if symptoms persist or worsen. Patient is aware when to return to the clinic for a follow-up visit. Patient educated on when it is appropriate to go to the emergency department.   Monia Pouch, FNP-C [facility] [phone]

## 2022-09-07 ENCOUNTER — Encounter: Payer: Medicare Other | Admitting: Family Medicine

## 2022-10-06 ENCOUNTER — Encounter: Payer: Self-pay | Admitting: Pulmonary Disease

## 2022-10-06 ENCOUNTER — Ambulatory Visit: Payer: Medicare Other | Admitting: Pulmonary Disease

## 2022-10-06 VITALS — BP 127/77 | HR 77 | Ht 68.5 in | Wt 294.2 lb

## 2022-10-06 DIAGNOSIS — G4733 Obstructive sleep apnea (adult) (pediatric): Secondary | ICD-10-CM

## 2022-10-06 NOTE — Patient Instructions (Signed)
CPAP is working well on current settings Good luck with surgery

## 2022-10-06 NOTE — Assessment & Plan Note (Signed)
CPAP download was reviewed which shows excellent control of events on auto settings 10  to 15 cm with average pressure 12.4 and maximum pressure of 13.6 cm.  He is very compliant between 6 to 7 hours per night.  CPAP has certainly helped improve his daytime somnolence and fatigue  Weight loss encouraged, compliance with goal of at least 4-6 hrs every night is the expectation. Advised against medications with sedative side effects Cautioned against driving when sleepy - understanding that sleepiness will vary on a day to day basis

## 2022-10-06 NOTE — Progress Notes (Signed)
   Subjective:    Patient ID: Dustin Duff Sr., male    DOB: [birth year], 57 y.o.   MRN: [id number]  HPI  57 yo for FU of severe OSA   OSA was diagnosed 20 years ago at [facility] .  He is on his third machine auto 10-15, uses a full facemask since he is a mouth breather.  He also uses Flonase on a daily basis .he was evaluated by ENT and nasal septoplasty is being considered  after his treatment for tonsillar cancer, he transiently required PEG and his weight has dropped from a peak of 311   PMH -tonsillar cancer status tonsillectomy/adenoidectomy, post neck dissection , chemo RT, last 10/2019  Chief Complaint  Patient presents with   Follow-up    Pt f/u states that he does not sleep well in general but CPAP machine is working well. States that he is having a total knee replacement later this year.    Annual follow-up visit. He states he does not sleep well in general.  This may be because of dryness of his mouth or it may be because of spasms that he gets in his neck and mild diarrhea from radiation treatment for his cancer.  CPAP is overall working well.  He does not feel very rested in the daytime.  He uses fullface mask.  He only uses humidity in the winter  He is planning on knee replacement surgery.  Weight loss has been advised.  Close current machine is 65-61 years old.  No problems with pressure   Significant tests/ events reviewed  07/2021 HST - severe OSA with AHI 48/ hr    Review of Systems neg for any significant sore throat, dysphagia, itching, sneezing, nasal congestion or excess/ purulent secretions, fever, chills, sweats, unintended wt loss, pleuritic or exertional cp, hempoptysis, orthopnea pnd or change in chronic leg swelling. Also denies presyncope, palpitations, heartburn, abdominal pain, nausea, vomiting, diarrhea or change in bowel or urinary habits, dysuria,hematuria, rash, arthralgias, visual complaints, headache, numbness weakness or ataxia.      Objective:   Physical Exam  Gen. Pleasant, obese, in no distress ENT - no lesions, no post nasal drip Neck: No JVD, no thyromegaly, no carotid bruits Lungs: no use of accessory muscles, no dullness to percussion, decreased without rales or rhonchi  Cardiovascular: Rhythm regular, heart sounds  normal, no murmurs or gallops, no peripheral edema Musculoskeletal: No deformities, no cyanosis or clubbing , no tremors       Assessment & Plan:    Preop clearance -he is planning on knee replacement surgery later this year.  He would need to lose some weight first.  He would be at mild risk from a pulmonary standpoint for this procedure.  We would advise perioperative precautions for sleep apnea

## 2022-10-08 ENCOUNTER — Encounter: Payer: Self-pay | Admitting: Family Medicine

## 2022-10-08 ENCOUNTER — Ambulatory Visit (INDEPENDENT_AMBULATORY_CARE_PROVIDER_SITE_OTHER): Payer: Medicare Other | Admitting: Family Medicine

## 2022-10-08 VITALS — BP 139/86 | HR 69 | Temp 98.0°F | Ht 68.5 in | Wt 295.4 lb

## 2022-10-08 DIAGNOSIS — Z0000 Encounter for general adult medical examination without abnormal findings: Secondary | ICD-10-CM | POA: Diagnosis not present

## 2022-10-08 NOTE — Progress Notes (Signed)
Annual Wellness Visit     Patient: Dustin Rios., Male    DOB: 09-19-1965, 57 y.o.   MRN: [id number]  Subjective  No chief complaint on file.   Rick Duff Sr. is a 57 y.o. male who presents today for his Welcome to Medicare Visit. Disabled by head and neck cancer. Has damage to nerves that weakened his RUE. Also destroyed his salivary glands.  He reports consuming a general diet.  Yard work chasing his 2 grand daughters   He generally feels fairly well. He reports sleeping poorly. He does not have additional problems to discuss today.   HPI  Dental: No current dental problems   Patient Active Problem List   Diagnosis Date Noted   Acquired hypothyroidism 01/13/2022   Calculus of gallbladder without cholecystitis without obstruction 08/20/2020   Nephrolithiasis 03/21/2020   Family history of uterine cancer    Family history of cancer    Port-A-Cath in place 12/28/2019   Hematuria 12/18/2019   Counseling regarding advance care planning and goals of care 10/12/2019   Malignant neoplasm of tonsillar fossa (HCC) 09/11/2019   S/P tonsillectomy 08/21/2019   Squamous cell carcinoma of head and neck 08/10/2019   Morbid obesity (HCC) 06/08/2019   Moderate mixed hyperlipidemia not requiring statin therapy 06/08/2019   OSA (obstructive sleep apnea) 06/08/2019   Change in stool 02/22/2017   Gout 02/22/2017   Past Medical History:  Diagnosis Date   Arthritis    Change in stool 02/22/2017   Family history of cancer    Family history of uterine cancer    Gout 02/22/2017   Hyperlipidemia    Sleep apnea    Uses CPAP   Tonsil cancer ([facility]) DX'd 07/2019   metastatic   Past Surgical History:  Procedure Laterality Date   CARPAL TUNNEL RELEASE Right 04/10/2019   CHOLECYSTECTOMY N/A 09/09/2020   Procedure: LAPAROSCOPIC CHOLECYSTECTOMY;  Surgeon: Lucretia Roers, MD;  Location[facility];  Service: General;  Laterality: N/A;   COLONOSCOPY N/A 02/20/2021   Procedure: COLONOSCOPY;   Surgeon: Malissa Hippo, MD;  [facility];  Service: Endoscopy;  Laterality: N/A;  9:30   DIRECT LARYNGOSCOPY N/A 08/21/2019   Procedure: DIRECT LARYNGOSCOPY - WITH BX;  Surgeon: Serena Colonel, MD;  Location: [facility];  Service: ENT;  Laterality: N/A;   IR GASTROSTOMY TUBE MOD SED  10/13/2019   IR GASTROSTOMY TUBE REMOVAL  06/10/2020   IR IMAGING GUIDED PORT INSERTION  10/13/2019   IR REMOVAL TUN ACCESS W/ PORT W/O FL MOD SED  06/10/2020   NECK DISSECTION  09/11/2019   POLYPECTOMY  02/20/2021   Procedure: POLYPECTOMY;  Surgeon: Malissa Hippo, MD;  [facility];  Service: Endoscopy;;   RADICAL NECK DISSECTION Right 09/11/2019   Procedure: RADICAL NECK DISSECTION;  Surgeon: Serena Colonel, MD;  Location: [facility];  Service: ENT;  Laterality: Right;   removal of right jugular vein in neck     TONSILLECTOMY Bilateral 08/21/2019   Procedure: TONSILLECTOMY;  Surgeon: Serena Colonel, MD;  Location: [facility];  Service: ENT;  Laterality: Bilateral;   UMBILICAL HERNIA REPAIR  09/09/2020   Procedure: REPAIR UMBILICAL HERNIA;  Surgeon: Lucretia Roers, MD;  Location: [facility];  Service: General;;   WISDOM TOOTH EXTRACTION     Social History   Tobacco Use   Smoking status: Never   Smokeless tobacco: Never  Vaping Use   Vaping Use: Never used  Substance Use Topics  Alcohol use: No   Drug use: No   Family History  Problem Relation Age of Onset   COPD Mother    Uterine cancer Mother 34   Lung cancer Brother 48       smoker   Lung cancer Maternal Grandfather    Stomach cancer Paternal Grandmother        d. 73s   Cervical cancer Maternal Aunt    Lung cancer Maternal Grandmother        smoker   Prostate cancer Paternal Grandfather    Pancreatic cancer Cousin 15       mother's maternal first cousin   Stomach cancer Cousin 26       mother's maternal first cousin   Cancer Cousin        mother's maternal first cousin with NOS cancer   Cancer Other         MGMs sister with cancer NOS   Lung cancer Other        smoker   Cancer Other        mother's maternal grandfather with NOS cancer   No Known Allergies    Medications: Outpatient Medications Prior to Visit  Medication Sig   Colchicine (MITIGARE) 0.6 MG CAPS Take every two hours at onset of gout attack until pain resolves or 6 have been taken. Then decrease to two daily until flare ends (Patient taking differently: Take 0.6 mg by mouth daily as needed (gout). Take every two hours at onset of gout attack until pain resolves or 6 have been taken. Then decrease to two daily until flare ends)   fluticasone (FLONASE) 50 MCG/ACT nasal spray Place 2 sprays into both nostrils daily as needed for allergies.   ibuprofen (ADVIL) 200 MG tablet Take 600 mg by mouth every 8 (eight) hours as needed for fever, headache or mild pain.   levothyroxine (SYNTHROID) 75 MCG tablet Take 1 tablet (75 mcg total) by mouth daily.   loratadine (CLARITIN) 10 MG tablet Take 10 mg by mouth daily.   meloxicam (MOBIC) 15 MG tablet Take 15 mg by mouth daily as needed for pain.   Facility-Administered Medications Prior to Visit  Medication Dose Route Frequency Provider   0.9 %  sodium chloride infusion   Intravenous Continuous Ralene Muskrat, PA-C    No Known Allergies  Patient Care Team: Mechele Claude, MD as PCP - General (Family Medicine) Flo Shanks, MD as Consulting Physician (Otolaryngology) Lonie Peak, MD as Attending Physician (Radiation Oncology) Arthur Holms, MD (Inactive) as Consulting Physician (Hematology) Malmfelt, Lise Auer, RN as Oncology Nurse Navigator (Oncology)  ROS      Objective  BP 139/86   Pulse 69   Temp 98 F (36.7 C)   Ht 5' 8.5" (1.74 m)   Wt 295 lb 6.4 oz (134 kg)   SpO2 96%   BMI 44.26 kg/m  BP Readings from Last 3 Encounters:  10/08/22 139/86  10/06/22 127/77  08/05/22 136/87      Physical Exam    Most recent functional status assessment:    10/08/2022     1:23 PM  In your present state of health, do you have any difficulty performing the following activities:  Hearing? 1  Vision? 1  Difficulty concentrating or making decisions? 0  Walking or climbing stairs? 1  Comment needs knee replacement  Dressing or bathing? 0  Doing errands, shopping? 0  Preparing Food and eating ? N  Using the Toilet? N  In the past six months, have  you accidently leaked urine? N  Do you have problems with loss of bowel control? N  Managing your Medications? N  Managing your Finances? N  Housekeeping or managing your Housekeeping? N   Most recent fall risk assessment:    10/08/2022    1:22 PM  Fall Risk   Falls in the past year? 0    Most recent depression screenings:    10/08/2022    1:22 PM 08/05/2022   11:54 AM  PHQ 2/9 Scores  PHQ - 2 Score 0 0  PHQ- 9 Score  3   Most recent cognitive screening:    10/08/2022    1:24 PM  6CIT Screen  What Year? 0 points  What month? 0 points  What time? 0 points  Count back from 20 0 points  Months in reverse 0 points  Repeat phrase 0 points  Total Score 0 points   Most recent Audit-C alcohol use screening    10/08/2022    1:20 PM  Alcohol Use Disorder Test (AUDIT)  1. How often do you have a drink containing alcohol? 0  2. How many drinks containing alcohol do you have on a typical day when you are drinking? 0  3. How often do you have six or more drinks on one occasion? 0  AUDIT-C Score 0   A score of 3 or more in women, and 4 or more in men indicates increased risk for alcohol abuse, EXCEPT if all of the points are from question 1   Vision/Hearing Screen: Nml for confrontation  EKG: NSR, nml axes, no schemia   Assessment & Plan   Annual wellness visit done today including the all of the following: Reviewed patient's Family Medical History Reviewed and updated list of patient's medical providers Assessment of cognitive impairment was done Assessed patient's functional  ability Established a written schedule for health screening services Health Risk Assessent Completed and Reviewed  Exercise Activities and Dietary recommendations  Goals   None     Immunization History  Administered Date(s) Administered   Moderna Sars-Covid-2 Vaccination 08/03/2019, 09/05/2019   Tdap 06/08/2019    Health Maintenance  Topic Date Due   COVID-19 Vaccine (3 - Moderna risk series) 10/24/2022 (Originally 10/03/2019)   Zoster Vaccines- Shingrix (1 of 2) 01/08/2023 (Originally 12/18/1983)   Hepatitis C Screening  10/08/2023 (Originally 12/18/1982)   HIV Screening  10/08/2023 (Originally 12/18/1979)   INFLUENZA VACCINE  12/10/2022   Medicare Annual Wellness (AWV)  10/08/2023   Colonoscopy  02/20/2026   DTaP/Tdap/Td (2 - Td or Tdap) 06/07/2029   HPV VACCINES  Aged Out     Discussed health benefits of physical activity, and encouraged him to engage in regular exercise appropriate for his age and condition.    Problem List Items Addressed This Visit   None Visit Diagnoses     Welcome to Medicare preventive visit    -  Primary   Relevant Orders   EKG 12-Lead (Completed)       Return in about 3 months (around 01/08/2023) for [facility] physical.     Mechele Claude, MD

## 2022-11-19 ENCOUNTER — Encounter: Payer: Self-pay | Admitting: Internal Medicine

## 2022-11-19 ENCOUNTER — Ambulatory Visit: Payer: Medicare Other | Admitting: Internal Medicine

## 2022-11-19 VITALS — BP 132/84 | HR 70 | Ht 68.5 in | Wt 294.0 lb

## 2022-11-19 DIAGNOSIS — E039 Hypothyroidism, unspecified: Secondary | ICD-10-CM

## 2022-11-19 LAB — TSH: TSH: 3.78 u[IU]/mL (ref 0.35–5.50)

## 2022-11-19 MED ORDER — LEVOTHYROXINE SODIUM 75 MCG PO TABS: 75.0000 ug | ORAL_TABLET | Freq: Every day | ORAL | 3 refills | Status: DC

## 2022-11-19 NOTE — Progress Notes (Signed)
Name: Dustin Corker Sr.  MRN/ DOB: [id number], July 27, 1965    Age/ Sex: 57 y.o., male    PCP: Mechele Claude, MD   Reason for Endocrinology Evaluation: Subclinical hypothyroidism     Date of Initial Endocrinology Evaluation: 01/13/2022    HPI: [name] is a 57 y.o. male with a past medical history of Hx of tonsillar cancer, OSA. The patient presented for initial endocrinology clinic visit on 01/13/2022 for consultative assistance with his Subclinical Hypothyroidism.   Patient has been diagnosed with right  tonsillar cancer (Sq. Cell carcinoma)  in June 2021, he is S/P right neck dissection, chemo/XRT  During evaluation for fatigue by oncology, the patient has been noted with an elevated TSH in August 2022, TSH max level 5.67 uIU/ml in January 2023, this has been attributed to prior radiation treatment of the neck  On his initial visit to our clinic he was started on levothyroxine due to a TSH of 9.99 u IU/mL with borderline free T4 at 0.65 NG/DL (0/8657)   Anti-Tpo negative  No Fh of thyroid disease   SUBJECTIVE:    Today (11/19/22): Dustin Duff Sr. is here for follow-up on hypothyroidism.     Patient continues to follow-up with Dr. Pollyann Kennedy for oropharyngeal cancer 11/16/2022, no evidence of recurrence He also had a follow-up with oncology 05/20/2022 Patient has been noted with weight loss He has a pending right knee surgery, and is motivated to lose the weight Denies local neck swelling  Denies constipation or diarrhea  Continues with occasional palpitations which is chronic  Energy stable    Levothyroxine 75 mcg daily   HISTORY:  Past Medical History:  Past Medical History:  Diagnosis Date   Arthritis    Change in stool 02/22/2017   Family history of cancer    Family history of uterine cancer    Gout 02/22/2017   Hyperlipidemia    Sleep apnea    Uses CPAP   Tonsil cancer (HCC) DX'd 07/2019   metastatic   Past Surgical History:  Past Surgical  History:  Procedure Laterality Date   CARPAL TUNNEL RELEASE Right 04/10/2019   CHOLECYSTECTOMY N/A 09/09/2020   Procedure: LAPAROSCOPIC CHOLECYSTECTOMY;  Surgeon: Lucretia Roers, MD;  Location: [facility];  Service: General;  Laterality: N/A;   COLONOSCOPY N/A 02/20/2021   Procedure: COLONOSCOPY;  Surgeon: Malissa Hippo, MD;  [facility];  Service: Endoscopy;  Laterality: N/A;  9:30   DIRECT LARYNGOSCOPY N/A 08/21/2019   Procedure: DIRECT LARYNGOSCOPY - WITH BX;  Surgeon: Serena Colonel, MD;  Location: [facility];  Service: ENT;  Laterality: N/A;   IR GASTROSTOMY TUBE MOD SED  10/13/2019   IR GASTROSTOMY TUBE REMOVAL  06/10/2020   IR IMAGING GUIDED PORT INSERTION  10/13/2019   IR REMOVAL TUN ACCESS W/ PORT W/O FL MOD SED  06/10/2020   NECK DISSECTION  09/11/2019   POLYPECTOMY  02/20/2021   Procedure: POLYPECTOMY;  Surgeon: Malissa Hippo, MD;  Location: [facility];  Service: Endoscopy;;   RADICAL NECK DISSECTION Right 09/11/2019   Procedure: RADICAL NECK DISSECTION;  Surgeon: Serena Colonel, MD;  Location: [facility];  Service: ENT;  Laterality: Right;   removal of right jugular vein in neck     TONSILLECTOMY Bilateral 08/21/2019   Procedure: TONSILLECTOMY;  Surgeon: Serena Colonel, MD;  Location: [facility];  Service: ENT;  Laterality: Bilateral;   UMBILICAL HERNIA REPAIR  09/09/2020   Procedure: REPAIR UMBILICAL HERNIA;  Surgeon:  Lucretia Roers, MD;  Location: [facility];  Service: General;;   WISDOM TOOTH EXTRACTION      Social History:  reports that he has never smoked. He has never used smokeless tobacco. He reports that he does not drink alcohol and does not use drugs. Family History: family history includes COPD in his mother; Cancer in his cousin and other family members; Cervical cancer in his maternal aunt; Lung cancer in his maternal grandfather, maternal grandmother, and another family member; Lung cancer (age of onset: 38) in his brother; Pancreatic  cancer (age of onset: 53) in his cousin; Prostate cancer in his paternal grandfather; Stomach cancer in his paternal grandmother; Stomach cancer (age of onset: 80) in his cousin; Uterine cancer (age of onset: 60) in his mother.   HOME MEDICATIONS: Allergies as of 11/19/2022   No Known Allergies      Medication List        Accurate as of November 19, 2022  9:24 AM. If you have any questions, ask your nurse or doctor.          Colchicine 0.6 MG Caps Commonly known as: Mitigare Take every two hours at onset of gout attack until pain resolves or 6 have been taken. Then decrease to two daily until flare ends What changed:  how much to take how to take this when to take this reasons to take this   fluticasone 50 MCG/ACT nasal spray Commonly known as: FLONASE Place 2 sprays into both nostrils daily as needed for allergies.   ibuprofen 200 MG tablet Commonly known as: ADVIL Take 600 mg by mouth every 8 (eight) hours as needed for fever, headache or mild pain.   levothyroxine 75 MCG tablet Commonly known as: SYNTHROID Take 1 tablet (75 mcg total) by mouth daily.   loratadine 10 MG tablet Commonly known as: CLARITIN Take 10 mg by mouth daily.   meloxicam 15 MG tablet Commonly known as: MOBIC Take 15 mg by mouth daily as needed for pain.          REVIEW OF SYSTEMS: A comprehensive ROS was conducted with the patient and is negative except as per HPI     OBJECTIVE:  VS: BP 132/84 (BP Location: Left Arm, Patient Position: Sitting, Cuff Size: Large)   Pulse 70   Ht 5' 8.5" (1.74 m)   Wt 294 lb (133.4 kg)   SpO2 96%   BMI 44.05 kg/m    Wt Readings from Last 3 Encounters:  11/19/22 294 lb (133.4 kg)  10/08/22 295 lb 6.4 oz (134 kg)  10/06/22 294 lb 3.2 oz (133.4 kg)     EXAM: General: Pt appears well and is in NAD  Eyes: External eye exam normal without stare, lid lag or exophthalmos.   Neck: General: Supple without adenopathy. Thyroid: Thyroid size normal.   No goiter or nodules appreciated.   Lungs: Clear with good BS bilat   Heart: Auscultation: RRR.  Abdomen: Soft, non tender  Extremities:  BL LE: No pretibial edema   Mental Status: Judgment, insight: Intact Orientation: Oriented to time, place, and person Mood and affect: No depression, anxiety, or agitation     DATA REVIEWED:  Latest Reference Range & Units 11/19/22 10:24  TSH 0.35 - 5.50 uIU/mL 3.78     ASSESSMENT/PLAN/RECOMMENDATIONS:   Hypothyroidism:  -Patient is clinically euthyroid -Pt educated extensively on the correct way to take levothyroxine (first thing in the morning with water, 30 minutes before eating or taking other medications). - Pt  encouraged to double dose the following day if she were to miss a dose given long half-life of levothyroxine. - TPO Ab's negative  -TSH at goal, no change   Medications : levothyroxine 75 mcg daily     Follow-up in 6 months   Signed electronically by: Lyndle Herrlich, MD  [facility] Endocrin[facility]  [facility] [address] [place] [place], [place] [address] Phone: [phone] FAX: [phone]   CC: Mechele Claude, MD [address] [place] [address] Phone: [phone] Fax: [phone]   Return to Endocrinology clinic as below: Future Appointments  Date Time Provider Department Center  01/05/2023  8:55 AM Mechele Claude, MD [facility] None  04/28/2023  8:50 AM McKenzie, Mardene Celeste, MD [facility] None

## 2022-11-19 NOTE — Patient Instructions (Signed)

## 2023-01-05 ENCOUNTER — Encounter: Payer: Self-pay | Admitting: Family Medicine

## 2023-01-05 ENCOUNTER — Ambulatory Visit (INDEPENDENT_AMBULATORY_CARE_PROVIDER_SITE_OTHER): Payer: Medicare Other | Admitting: Family Medicine

## 2023-01-05 VITALS — BP 130/76 | HR 74 | Temp 97.7°F | Ht 68.5 in | Wt 299.6 lb

## 2023-01-05 DIAGNOSIS — G540 Brachial plexus disorders: Secondary | ICD-10-CM | POA: Diagnosis not present

## 2023-01-05 DIAGNOSIS — C801 Malignant (primary) neoplasm, unspecified: Secondary | ICD-10-CM

## 2023-01-05 DIAGNOSIS — E782 Mixed hyperlipidemia: Secondary | ICD-10-CM | POA: Diagnosis not present

## 2023-01-05 DIAGNOSIS — Z0001 Encounter for general adult medical examination with abnormal findings: Secondary | ICD-10-CM | POA: Diagnosis not present

## 2023-01-05 DIAGNOSIS — E039 Hypothyroidism, unspecified: Secondary | ICD-10-CM

## 2023-01-05 DIAGNOSIS — Z0000 Encounter for general adult medical examination without abnormal findings: Secondary | ICD-10-CM

## 2023-01-05 LAB — URINALYSIS
Bilirubin, UA: NEGATIVE
Glucose, UA: NEGATIVE
Ketones, UA: NEGATIVE
Leukocytes,UA: NEGATIVE
Nitrite, UA: NEGATIVE
RBC, UA: NEGATIVE
Specific Gravity, UA: 1.025 (ref 1.005–1.030)
Urobilinogen, Ur: 0.2 mg/dL (ref 0.2–1.0)
pH, UA: 5.5 (ref 5.0–7.5)

## 2023-01-05 NOTE — Progress Notes (Signed)
Subjective:  Patient ID: Dustin Duff Sr., male    DOB: 11-23-1965  Age: 56 y.o. MRN: [id number]  CC: Annual Exam   HPI Dustin Schaefer Sr. presents for CPE.       01/05/2023    9:09 AM 10/08/2022    1:22 PM 08/05/2022   11:54 AM  Depression screen PHQ 2/9  Decreased Interest 0 0 0  Down, Depressed, Hopeless 0 0 0  PHQ - 2 Score 0 0 0  Altered sleeping   1  Tired, decreased energy   1  Change in appetite   1  Feeling bad or failure about yourself    0  Trouble concentrating   0  Moving slowly or fidgety/restless   0  Suicidal thoughts   0  PHQ-9 Score   3  Difficult doing work/chores   Not difficult at all    History [place] has a past medical history of Arthritis, Change in stool (02/22/2017), Family history of cancer, Family history of uterine cancer, Gout (02/22/2017), Hyperlipidemia, Sleep apnea, and Tonsil cancer (HCC) (DX'd 07/2019).   He has a past surgical history that includes Carpal tunnel release (Right, 04/10/2019); Tonsillectomy (Bilateral, 08/21/2019); Direct laryngoscopy (N/A, 08/21/2019); Wisdom tooth extraction; Neck dissection (09/11/2019); Radical neck dissection (Right, 09/11/2019); IR IMAGING GUIDED PORT INSERTION (10/13/2019); IR GASTROSTOMY TUBE MOD SED (10/13/2019); IR REMOVAL TUN ACCESS W/ PORT W/O FL MOD SED (06/10/2020); IR GASTROSTOMY TUBE REMOVAL/REPAIR (06/10/2020); removal of right jugular vein in neck; Cholecystectomy (N/A, 09/09/2020); Umbilical hernia repair (09/09/2020); Colonoscopy (N/A, 02/20/2021); and polypectomy (02/20/2021).   His family history includes COPD in his mother; Cancer in his cousin and other family members; Cervical cancer in his maternal aunt; Lung cancer in his maternal grandfather, maternal grandmother, and another family member; Lung cancer (age of onset: 31) in his brother; Pancreatic cancer (age of onset: 30) in his cousin; Prostate cancer in his paternal grandfather; Stomach cancer in his paternal grandmother; Stomach cancer (age of  onset: 32) in his cousin; Uterine cancer (age of onset: 68) in his mother.He reports that he has never smoked. He has never used smokeless tobacco. He reports that he does not drink alcohol and does not use drugs.    ROS Review of Systems  Constitutional:  Negative for activity change, fatigue and unexpected weight change.  HENT:  Negative for congestion, ear pain, hearing loss, postnasal drip and trouble swallowing.   Eyes:  Negative for pain and visual disturbance.  Respiratory:  Negative for cough, chest tightness and shortness of breath.   Cardiovascular:  Negative for chest pain, palpitations and leg swelling.  Gastrointestinal:  Negative for abdominal distention, abdominal pain, blood in stool, constipation, diarrhea, nausea and vomiting.  Endocrine: Negative for cold intolerance, heat intolerance and polydipsia.  Genitourinary:  Negative for difficulty urinating, dysuria, flank pain, frequency and urgency.  Musculoskeletal:  Positive for arthralgias (right knee needs replacement. Planning for later this year. Meloxicam helps.). Negative for joint swelling.       Right arm weak due to neck dissection for cancer that caused damage to brachial plexus.  Skin:  Negative for color change, rash and wound.  Neurological:  Negative for dizziness, syncope, speech difficulty, weakness, light-headedness, numbness and headaches.  Hematological:  Does not bruise/bleed easily.  Psychiatric/Behavioral:  Negative for confusion, decreased concentration, dysphoric mood and sleep disturbance. The patient is not nervous/anxious.     Objective:  BP 130/76   Pulse 74   Temp 97.7 F (36.5 C)   Ht 5' 8.5" (1.74  m)   Wt 299 lb 9.6 oz (135.9 kg)   SpO2 94%   BMI 44.89 kg/m   BP Readings from Last 3 Encounters:  01/05/23 130/76  11/19/22 132/84  10/08/22 139/86    Wt Readings from Last 3 Encounters:  01/05/23 299 lb 9.6 oz (135.9 kg)  11/19/22 294 lb (133.4 kg)  10/08/22 295 lb 6.4 oz (134 kg)      Physical Exam Vitals reviewed.  Constitutional:      Appearance: He is well-developed.  HENT:     Head: Normocephalic and atraumatic.     Right Ear: External ear normal.     Left Ear: External ear normal.     Mouth/Throat:     Pharynx: No oropharyngeal exudate or posterior oropharyngeal erythema.  Eyes:     Pupils: Pupils are equal, round, and reactive to light.  Neck:     Thyroid: No thyromegaly.     Trachea: No tracheal deviation.  Cardiovascular:     Rate and Rhythm: Normal rate and regular rhythm.     Heart sounds: Normal heart sounds. No murmur heard.    No friction rub. No gallop.  Pulmonary:     Effort: No respiratory distress.     Breath sounds: Normal breath sounds. No wheezing or rales.  Abdominal:     General: Bowel sounds are normal. There is no distension.     Palpations: Abdomen is soft. There is no mass.     Tenderness: There is no abdominal tenderness.     Hernia: There is no hernia in the left inguinal area.  Genitourinary:    Penis: Normal.      Testes: Normal.  Musculoskeletal:        General: Normal range of motion.     Cervical back: Normal range of motion and neck supple.  Lymphadenopathy:     Cervical: No cervical adenopathy.  Skin:    General: Skin is warm and dry.  Neurological:     Mental Status: He is alert and oriented to person, place, and time.       Assessment & Plan:   Abi was seen today for annual exam.  Diagnoses and all orders for this visit:  Well adult exam -     CBC with Differential/Platelet -     CMP14+EGFR -     Lipid panel -     Urinalysis -     VITAMIN D 25 Hydroxy (Vit-D Deficiency, Fractures) -     PSA, total and free  Morbid obesity (HCC)  Moderate mixed hyperlipidemia not requiring statin therapy  Brachial plexus neuropathy due to cancer [facility])  Acquired hypothyroidism       I am having Dustin Duff Sr. maintain his ibuprofen, fluticasone, Colchicine, loratadine, meloxicam, and  levothyroxine.  Allergies as of 01/05/2023   No Known Allergies      Medication List        Accurate as of January 05, 2023  5:33 PM. If you have any questions, ask your nurse or doctor.          Colchicine 0.6 MG Caps Commonly known as: Mitigare Take every two hours at onset of gout attack until pain resolves or 6 have been taken. Then decrease to two daily until flare ends What changed:  how much to take how to take this when to take this reasons to take this   fluticasone 50 MCG/ACT nasal spray Commonly known as: FLONASE Place 2 sprays into both nostrils daily as needed for  allergies.   ibuprofen 200 MG tablet Commonly known as: ADVIL Take 600 mg by mouth every 8 (eight) hours as needed for fever, headache or mild pain.   levothyroxine 75 MCG tablet Commonly known as: SYNTHROID Take 1 tablet (75 mcg total) by mouth daily.   loratadine 10 MG tablet Commonly known as: CLARITIN Take 10 mg by mouth daily.   meloxicam 15 MG tablet Commonly known as: MOBIC Take 15 mg by mouth daily as needed for pain.         Follow-up: Return in about 1 year (around 01/05/2024).  Mechele Claude, M.D.

## 2023-01-06 LAB — CMP14+EGFR
ALT: 19 IU/L (ref 0–44)
AST: 19 IU/L (ref 0–40)
Albumin: 4.1 g/dL (ref 3.8–4.9)
Alkaline Phosphatase: 76 IU/L (ref 44–121)
BUN/Creatinine Ratio: 14 (ref 9–20)
BUN: 19 mg/dL (ref 6–24)
Bilirubin Total: 0.5 mg/dL (ref 0.0–1.2)
CO2: 25 mmol/L (ref 20–29)
Calcium: 9.4 mg/dL (ref 8.7–10.2)
Chloride: 100 mmol/L (ref 96–106)
Creatinine, Ser: 1.34 mg/dL — ABNORMAL HIGH (ref 0.76–1.27)
Globulin, Total: 2.4 g/dL (ref 1.5–4.5)
Glucose: 85 mg/dL (ref 70–99)
Potassium: 4.4 mmol/L (ref 3.5–5.2)
Sodium: 139 mmol/L (ref 134–144)
Total Protein: 6.5 g/dL (ref 6.0–8.5)
eGFR: 61 mL/min/{1.73_m2} (ref >59)

## 2023-01-06 LAB — CBC WITH DIFFERENTIAL/PLATELET
Basophils Absolute: 0 10*3/uL (ref 0.0–0.2)
Basos: 1 %
EOS (ABSOLUTE): 0.1 10*3/uL (ref 0.0–0.4)
Eos: 2 %
Hematocrit: 46.2 % (ref 37.5–51.0)
Hemoglobin: 15.6 g/dL (ref 13.0–17.7)
Immature Grans (Abs): 0 10*3/uL (ref 0.0–0.1)
Immature Granulocytes: 0 %
Lymphocytes Absolute: 1 10*3/uL (ref 0.7–3.1)
Lymphs: 23 %
MCH: 30 pg (ref 26.6–33.0)
MCHC: 33.8 g/dL (ref 31.5–35.7)
MCV: 89 fL (ref 79–97)
Monocytes Absolute: 0.4 10*3/uL (ref 0.1–0.9)
Monocytes: 9 %
Neutrophils Absolute: 2.9 10*3/uL (ref 1.4–7.0)
Neutrophils: 65 %
Platelets: 193 10*3/uL (ref 150–450)
RBC: 5.2 x10E6/uL (ref 4.14–5.80)
RDW: 13.2 % (ref 11.6–15.4)
WBC: 4.5 10*3/uL (ref 3.4–10.8)

## 2023-01-06 LAB — VITAMIN D 25 HYDROXY (VIT D DEFICIENCY, FRACTURES): Vit D, 25-Hydroxy: 41.6 ng/mL (ref 30.0–100.0)

## 2023-01-06 LAB — LIPID PANEL
Chol/HDL Ratio: 3.7 ratio (ref 0.0–5.0)
Cholesterol, Total: 157 mg/dL (ref 100–199)
HDL: 43 mg/dL (ref >39)
LDL Chol Calc (NIH): 75 mg/dL (ref 0–99)
Triglycerides: 241 mg/dL — ABNORMAL HIGH (ref 0–149)
VLDL Cholesterol Cal: 39 mg/dL (ref 5–40)

## 2023-01-06 LAB — PSA, TOTAL AND FREE
PSA, Free Pct: 45.7 %
PSA, Free: 0.32 ng/mL
Prostate Specific Ag, Serum: 0.7 ng/mL (ref 0.0–4.0)

## 2023-01-06 NOTE — Progress Notes (Signed)
Hello Dustin Rios,  Your lab result is normal and/or stable.Some minor variations that are not significant are commonly marked abnormal, but do not represent any medical problem for you.  Best regards, Warren Stacks, M.D.

## 2023-03-22 ENCOUNTER — Ambulatory Visit: Payer: Self-pay | Admitting: Family Medicine

## 2023-03-22 NOTE — Telephone Encounter (Signed)
Copied from CRM [id number]. Topic: Appointments - Appointment Scheduling >> Mar 22, 2023 11:40 AM Larwance Sachs wrote: Patient/patient representative is calling to schedule an appointment. Refer to attachments for appointment information.    Chief Complaint: Chest Pain Symptoms: "bruise" type of feeling Frequency: persistent Pertinent Negatives: Patient denies dizziness or weakness Disposition: [] ED /[] Urgent Care (no appt availability in office) / [x] Appointment(In office/virtual)/ []  [facility]/ [] Home Care/ [] Refused Recommended Disposition /[] [facility]/ []  Follow-up with PCP Additional Notes: Pt advised to call EMS if symptoms worsen or if he begins to feel weak and dizzy   Reason for Disposition  Taking a deep breath makes pain worse  Answer Assessment - Initial Assessment Questions 1. LOCATION: "Where does it hurt?"       Center/Left side, feels like a bruise hurts worse when taking a deep breath 2. RADIATION: "Does the pain go anywhere else?" (e.g., into neck, jaw, arms, back)     No 3. ONSET: "When did the chest pain begin?" (Minutes, hours or days)      2-3 weeks ago 4. PATTERN: "Does the pain come and go, or has it been constant since it started?"  "Does it get worse with exertion?"      Constant, get worse in the evening at the end of the day.  5. DURATION: "How long does it last" (e.g., seconds, minutes, hours)     persistent 6. SEVERITY: "How bad is the pain?"  (e.g., Scale 1-10; mild, moderate, or severe)    - MILD (1-3): doesn't interfere with normal activities     - MODERATE (4-7): interferes with normal activities or awakens from sleep    - SEVERE (8-10): excruciating pain, unable to do any normal activities       2- dull ache 7. CARDIAC RISK FACTORS: "Do you have any history of heart problems or risk factors for heart disease?" (e.g., angina, prior heart attack; diabetes, high blood pressure, high cholesterol, smoker, or strong family history of  heart disease)     No 8. PULMONARY RISK FACTORS: "Do you have any history of lung disease?"  (e.g., blood clots in lung, asthma, emphysema, birth control pills)     No 9. CAUSE: "What do you think is causing the chest pain?"     Unsure 10. OTHER SYMPTOMS: "Do you have any other symptoms?" (e.g., dizziness, nausea, vomiting, sweating, fever, difficulty breathing, cough)       No 11. PREGNANCY: "Is there any chance you are pregnant?" "When was your last menstrual period?"       No  Protocols used: Chest Pain-A-AH

## 2023-03-23 ENCOUNTER — Encounter: Payer: Self-pay | Admitting: Family Medicine

## 2023-03-23 ENCOUNTER — Ambulatory Visit: Payer: Medicare Other | Admitting: Family Medicine

## 2023-03-23 VITALS — BP 132/89 | HR 84 | Temp 97.6°F | Ht 68.5 in | Wt 300.8 lb

## 2023-03-23 DIAGNOSIS — R079 Chest pain, unspecified: Secondary | ICD-10-CM

## 2023-03-23 MED ORDER — SEMAGLUTIDE-WEIGHT MANAGEMENT 0.25 MG/0.5ML ~~LOC~~ SOAJ: 0.2500 mg | SUBCUTANEOUS | 1 refills | Status: AC

## 2023-03-23 MED ORDER — PREDNISONE 10 MG PO TABS: ORAL_TABLET | ORAL | 0 refills | Status: DC

## 2023-03-23 NOTE — Progress Notes (Signed)
Subjective:  Patient ID: Dustin Duff Sr., male    DOB: 02-Feb-1966  Age: 57 y.o. MRN: [id number]  CC: Chest Pain   HPI Dustin Schonberger Sr. presents for slight, dull, bruised feeling at [facility]. Onset 3 weeks. Last 15-30 min. Mild, no dyspnea.  No radiation. No diaphoresis or nausea. Not exertional.      03/23/2023    9:24 AM 03/23/2023    9:14 AM 01/05/2023    9:09 AM  Depression screen PHQ 2/9  Decreased Interest 0 0 0  Down, Depressed, Hopeless 0 0 0  PHQ - 2 Score 0 0 0  Altered sleeping 1    Tired, decreased energy 1    Change in appetite 1    Feeling bad or failure about yourself  0    Trouble concentrating 0    Moving slowly or fidgety/restless 1    Suicidal thoughts 0    PHQ-9 Score 4    Difficult doing work/chores Not difficult at all      History Dustin Rios has a past medical history of Arthritis, Change in stool (02/22/2017), Family history of cancer, Family history of uterine cancer, Gout (02/22/2017), Hyperlipidemia, Sleep apnea, and Tonsil cancer (HCC) (DX'd 07/2019).   He has a past surgical history that includes Carpal tunnel release (Right, 04/10/2019); Tonsillectomy (Bilateral, 08/21/2019); Direct laryngoscopy (N/A, 08/21/2019); Wisdom tooth extraction; Neck dissection (09/11/2019); Radical neck dissection (Right, 09/11/2019); IR IMAGING GUIDED PORT INSERTION (10/13/2019); IR GASTROSTOMY TUBE MOD SED (10/13/2019); IR REMOVAL TUN ACCESS W/ PORT W/O FL MOD SED (06/10/2020); IR GASTROSTOMY TUBE REMOVAL/REPAIR (06/10/2020); removal of right jugular vein in neck; Cholecystectomy (N/A, 09/09/2020); Umbilical hernia repair (09/09/2020); Colonoscopy (N/A, 02/20/2021); and polypectomy (02/20/2021).   His family history includes COPD in his mother; Cancer in his cousin and other family members; Cervical cancer in his maternal aunt; Lung cancer in his maternal grandfather, maternal grandmother, and another family member; Lung cancer (age of onset: 53) in his brother; Pancreatic cancer (age of  onset: 65) in his cousin; Prostate cancer in his paternal grandfather; Stomach cancer in his paternal grandmother; Stomach cancer (age of onset: 80) in his cousin; Uterine cancer (age of onset: 75) in his mother.He reports that he has never smoked. He has never used smokeless tobacco. He reports that he does not drink alcohol and does not use drugs.    ROS Review of Systems  Constitutional:  Negative for fever.  Respiratory:  Negative for shortness of breath.   Cardiovascular:  Positive for chest pain. Negative for palpitations.  Musculoskeletal:  Negative for arthralgias.  Skin:  Negative for rash.    Objective:  BP 132/89   Pulse 84   Temp 97.6 F (36.4 C)   Ht 5' 8.5" (1.74 m)   Wt (!) 300 lb 12.8 oz (136.4 kg)   SpO2 93%   BMI 45.07 kg/m   BP Readings from Last 3 Encounters:  03/23/23 132/89  01/05/23 130/76  11/19/22 132/84    Wt Readings from Last 3 Encounters:  03/23/23 (!) 300 lb 12.8 oz (136.4 kg)  01/05/23 299 lb 9.6 oz (135.9 kg)  11/19/22 294 lb (133.4 kg)     Physical Exam Vitals reviewed.  Constitutional:      Appearance: He is well-developed.  HENT:     Head: Normocephalic and atraumatic.     Right Ear: External ear normal.     Left Ear: External ear normal.     Mouth/Throat:     Pharynx: No oropharyngeal exudate or posterior oropharyngeal erythema.  Eyes:     Pupils: Pupils are equal, round, and reactive to light.  Cardiovascular:     Rate and Rhythm: Normal rate and regular rhythm.     Heart sounds: No murmur heard. Pulmonary:     Effort: No respiratory distress.     Breath sounds: Normal breath sounds.  Musculoskeletal:     Cervical back: Normal range of motion and neck supple.  Neurological:     Mental Status: He is alert and oriented to person, place, and time.    EKG - NSR, no ischemic changes.    Assessment & Plan:   Dustin Rios was seen today for chest pain.  Diagnoses and all orders for this visit:  Chest pain, unspecified  type -     EKG 12-Lead -     D-dimer, quantitative  Other orders -     predniSONE (DELTASONE) 10 MG tablet; Take 5 daily for 2 days followed by 4,3,2 and 1 for 2 days each. -     Semaglutide-Weight Management 0.25 MG/0.5ML SOAJ; Inject 0.25 mg into the skin once a week.       I am having Dustin Duff Sr. start on predniSONE and Semaglutide-Weight Management. I am also having him maintain his ibuprofen, fluticasone, Colchicine, loratadine, meloxicam, and levothyroxine. We will stop administering sodium chloride.  Allergies as of 03/23/2023   No Known Allergies      Medication List        Accurate as of March 23, 2023  6:59 PM. If you have any questions, ask your nurse or doctor.          Colchicine 0.6 MG Caps Commonly known as: Mitigare Take every two hours at onset of gout attack until pain resolves or 6 have been taken. Then decrease to two daily until flare ends What changed:  how much to take how to take this when to take this reasons to take this   fluticasone 50 MCG/ACT nasal spray Commonly known as: FLONASE Place 2 sprays into both nostrils daily as needed for allergies.   ibuprofen 200 MG tablet Commonly known as: ADVIL Take 600 mg by mouth every 8 (eight) hours as needed for fever, headache or mild pain.   levothyroxine 75 MCG tablet Commonly known as: SYNTHROID Take 1 tablet (75 mcg total) by mouth daily.   loratadine 10 MG tablet Commonly known as: CLARITIN Take 10 mg by mouth daily.   meloxicam 15 MG tablet Commonly known as: MOBIC Take 15 mg by mouth daily as needed for pain.   predniSONE 10 MG tablet Commonly known as: DELTASONE Take 5 daily for 2 days followed by 4,3,2 and 1 for 2 days each. Started by: Stirling Orton   Semaglutide-Weight Management 0.25 MG/0.5ML Soaj Inject 0.25 mg into the skin once a week. Started by: Broadus John Margarine Grosshans         Follow-up: Return if symptoms worsen or fail to improve.  Mechele Claude, M.D.

## 2023-03-24 LAB — D-DIMER, QUANTITATIVE: D-DIMER: 0.34 mg{FEU}/L (ref 0.00–0.49)

## 2023-03-24 NOTE — Progress Notes (Signed)
Hello Akeen,  Your lab result is normal and/or stable.Some minor variations that are not significant are commonly marked abnormal, but do not represent any medical problem for you.  Best regards, Modesto Ganoe, M.D.

## 2023-04-27 ENCOUNTER — Telehealth: Payer: Self-pay

## 2023-04-27 ENCOUNTER — Other Ambulatory Visit: Payer: Self-pay

## 2023-04-27 ENCOUNTER — Ambulatory Visit (HOSPITAL_COMMUNITY)
Admission: RE | Admit: 2023-04-27 | Discharge: 2023-04-27 | Disposition: A | Discharge status: Home / Self Care | Payer: Medicare Other | Source: Ambulatory Visit | Attending: Urology | Admitting: Urology

## 2023-04-27 DIAGNOSIS — N200 Calculus of kidney: Secondary | ICD-10-CM | POA: Diagnosis present

## 2023-04-27 NOTE — Telephone Encounter (Signed)
Pt called to verify he needed to get a KUB done before his appt. I confirmed he did

## 2023-04-28 ENCOUNTER — Encounter: Payer: Self-pay | Admitting: Urology

## 2023-04-28 ENCOUNTER — Ambulatory Visit: Payer: Medicare Other | Admitting: Urology

## 2023-04-28 VITALS — BP 143/80 | HR 93

## 2023-04-28 DIAGNOSIS — Z09 Encounter for follow-up examination after completed treatment for conditions other than malignant neoplasm: Secondary | ICD-10-CM

## 2023-04-28 DIAGNOSIS — N200 Calculus of kidney: Secondary | ICD-10-CM

## 2023-04-28 DIAGNOSIS — Z87442 Personal history of urinary calculi: Secondary | ICD-10-CM

## 2023-04-28 LAB — URINALYSIS, ROUTINE W REFLEX MICROSCOPIC
Bilirubin, UA: NEGATIVE
Glucose, UA: NEGATIVE
Ketones, UA: NEGATIVE
Leukocytes,UA: NEGATIVE
Nitrite, UA: NEGATIVE
Protein,UA: NEGATIVE
RBC, UA: NEGATIVE
Specific Gravity, UA: 1.03 (ref 1.005–1.030)
Urobilinogen, Ur: 0.2 mg/dL (ref 0.2–1.0)
pH, UA: 5.5 (ref 5.0–7.5)

## 2023-04-28 NOTE — Patient Instructions (Signed)

## 2023-04-28 NOTE — Progress Notes (Signed)
04/28/2023 8:58 AM   Rick Duff Sr. 1965-08-16 [id number]  Referring provider: Mechele Claude, MD [address] [place],  [place] [address]  Followup nephrolithiasis   HPI: Mr Cohee is a 57yo here for followup for nephrolithiasis. No stone events last visit. No flank pain. KUB from today shows no definitive calculi. IPSS 0 QOL 0 on no therapy. Urine pH is 5.5. He drinks over 64oz of water daily    PMH: Past Medical History:  Diagnosis Date   Arthritis    Change in stool 02/22/2017   Family history of cancer    Family history of uterine cancer    Gout 02/22/2017   Hyperlipidemia    Sleep apnea    Uses CPAP   Tonsil cancer (HCC) DX'd 07/2019   metastatic    Surgical History: Past Surgical History:  Procedure Laterality Date   CARPAL TUNNEL RELEASE Right 04/10/2019   CHOLECYSTECTOMY N/A 09/09/2020   Procedure: LAPAROSCOPIC CHOLECYSTECTOMY;  Surgeon: Lucretia Roers, MD;  [facility]: [facility];  Service: General;  Laterality: N/A;   COLONOSCOPY N/A 02/20/2021   Procedure: COLONOSCOPY;  Surgeon: Malissa Hippo, MD;  [facility];  Service: Endoscopy;  Laterality: N/A;  9:30   DIRECT LARYNGOSCOPY N/A 08/21/2019   Procedure: DIRECT LARYNGOSCOPY - WITH BX;  Surgeon: Serena Colonel, MD;  Location: [facility];  Service: ENT;  Laterality: N/A;   IR GASTROSTOMY TUBE MOD SED  10/13/2019   IR GASTROSTOMY TUBE REMOVAL  06/10/2020   IR IMAGING GUIDED PORT INSERTION  10/13/2019   IR REMOVAL TUN ACCESS W/ PORT W/O FL MOD SED  06/10/2020   NECK DISSECTION  09/11/2019   POLYPECTOMY  02/20/2021   Procedure: POLYPECTOMY;  Surgeon: Malissa Hippo, MD;  [facility];  Service: Endoscopy;;   RADICAL NECK DISSECTION Right 09/11/2019   Procedure: RADICAL NECK DISSECTION;  Surgeon: Serena Colonel, MD;  [facility];  Service: ENT;  Laterality: Right;   removal of right jugular vein in neck     TONSILLECTOMY Bilateral 08/21/2019   Procedure: TONSILLECTOMY;  Surgeon:  Serena Colonel, MD;  Location: [facility];  Service: ENT;  Laterality: Bilateral;   UMBILICAL HERNIA REPAIR  09/09/2020   Procedure: REPAIR UMBILICAL HERNIA;  Surgeon: Lucretia Roers, MD;  Location: [facility];  Service: General;;   WISDOM TOOTH EXTRACTION      Home Medications:  Allergies as of 04/28/2023   No Known Allergies      Medication List        Accurate as of April 28, 2023  8:58 AM. If you have any questions, ask your nurse or doctor.          Colchicine 0.6 MG Caps Commonly known as: Mitigare Take every two hours at onset of gout attack until pain resolves or 6 have been taken. Then decrease to two daily until flare ends What changed:  how much to take how to take this when to take this reasons to take this   fluticasone 50 MCG/ACT nasal spray Commonly known as: FLONASE Place 2 sprays into both nostrils daily as needed for allergies.   ibuprofen 200 MG tablet Commonly known as: ADVIL Take 600 mg by mouth every 8 (eight) hours as needed for fever, headache or mild pain.   levothyroxine 75 MCG tablet Commonly known as: SYNTHROID Take 1 tablet (75 mcg total) by mouth daily.   loratadine 10 MG tablet Commonly known as: CLARITIN Take 10 mg by mouth  daily.   meloxicam 15 MG tablet Commonly known as: MOBIC Take 15 mg by mouth daily as needed for pain.   predniSONE 10 MG tablet Commonly known as: DELTASONE Take 5 daily for 2 days followed by 4,3,2 and 1 for 2 days each.   Semaglutide-Weight Management 0.25 MG/0.5ML Soaj Inject 0.25 mg into the skin once a week.        Allergies: No Known Allergies  Family History: Family History  Problem Relation Age of Onset   COPD Mother    Uterine cancer Mother 108   Lung cancer Brother 52       smoker   Lung cancer Maternal Grandfather    Stomach cancer Paternal Grandmother        d. 83s   Cervical cancer Maternal Aunt    Lung cancer Maternal Grandmother        smoker   Prostate cancer  Paternal Grandfather    Pancreatic cancer Cousin 40       mother's maternal first cousin   Stomach cancer Cousin 24       mother's maternal first cousin   Cancer Cousin        mother's maternal first cousin with NOS cancer   Cancer Other        MGMs sister with cancer NOS   Lung cancer Other        smoker   Cancer Other        mother's maternal grandfather with NOS cancer    Social History:  reports that he has never smoked. He has never used smokeless tobacco. He reports that he does not drink alcohol and does not use drugs.  ROS: All other review of systems were reviewed and are negative except what is noted above in HPI  Physical Exam: BP (!) 143/80   Pulse 93   Constitutional:  Alert and oriented, No acute distress. HEENT: [place] AT, moist mucus membranes.  Trachea midline, no masses. Cardiovascular: No clubbing, cyanosis, or edema. Respiratory: Normal respiratory effort, no increased work of breathing. GI: Abdomen is soft, nontender, nondistended, no abdominal masses GU: No CVA tenderness.  Lymph: No cervical or inguinal lymphadenopathy. Skin: No rashes, bruises or suspicious lesions. Neurologic: Grossly intact, no focal deficits, moving all 4 extremities. Psychiatric: Normal mood and affect.  Laboratory Data: Lab Results  Component Value Date   WBC 4.5 01/05/2023   HGB 15.6 01/05/2023   HCT 46.2 01/05/2023   MCV 89 01/05/2023   PLT 193 01/05/2023    Lab Results  Component Value Date   CREATININE 1.34 (H) 01/05/2023    No results found for: "PSA"  No results found for: "TESTOSTERONE"  Lab Results  Component Value Date   HGBA1C 5.7 04/03/2019    Urinalysis    Component Value Date/Time   COLORURINE YELLOW 08/08/2020 0133   APPEARANCEUR Clear 01/05/2023 1009   LABSPEC 1.019 08/08/2020 0133   PHURINE 6.0 08/08/2020 0133   GLUCOSEU Negative 01/05/2023 1009   HGBUR NEGATIVE 08/08/2020 0133   BILIRUBINUR Negative 01/05/2023 1009   KETONESUR NEGATIVE  08/08/2020 0133   PROTEINUR 1+ (A) 01/05/2023 1009   PROTEINUR NEGATIVE 08/08/2020 0133   NITRITE Negative 01/05/2023 1009   NITRITE NEGATIVE 08/08/2020 0133   LEUKOCYTESUR Negative 01/05/2023 1009   LEUKOCYTESUR NEGATIVE 08/08/2020 0133    Lab Results  Component Value Date   LABMICR Comment 05/07/2021   BACTERIA RARE (A) 11/03/2019    Pertinent Imaging: KUb today: Images reviewed and discussed with the patient  Results  for orders placed during the hospital encounter of 05/07/21  Abdomen 1 view (KUB)  Narrative CLINICAL DATA:  Renal stones  EXAM: ABDOMEN - 1 VIEW  COMPARISON:  CT done on 02/01/2020  FINDINGS: Bowel gas pattern is nonspecific. No abnormal masses or calcifications are seen. Kidneys are partly obscured by bowel contents. Surgical clips are seen in gallbladder fossa. Degenerative changes are noted in the lumbar spine and lower thoracic spine.  IMPRESSION: Nonspecific bowel gas pattern. Kidneys are partly obscured by bowel contents limiting evaluation for small renal stones.   Electronically Signed By: Ernie Avena M.D. On: 05/08/2021 15:52  No results found for this or any previous visit.  No results found for this or any previous visit.  No results found for this or any previous visit.  Results for orders placed during the hospital encounter of 10/16/20  Ultrasound renal complete  Narrative CLINICAL DATA:  Nephrolithiasis follow-up  EXAM: RENAL / URINARY TRACT ULTRASOUND COMPLETE  COMPARISON:  CT the abdomen pelvis September 2021.  FINDINGS: Right Kidney:  Renal measurements: 9.9 x 5.9 x 5.7 cm = volume: 1721 mL. There is a shadowing 13 5 mm stone in the upper pole of the right kidney. Other small echogenic foci could represent tiny stones.  Left Kidney:  Renal measurements: 11.3 x 6.2 x 5.5 cm = volume: 200.5 mL. Multiple small echogenic foci identified in the left kidney.  Bladder:  Appears normal for degree of bladder  distention.  Other:  None.  IMPRESSION: There appears to be a 13.5 mm shadowing stone in the upper right kidney. Other small stones scattered throughout both kidneys suspected.   Electronically Signed By: Gerome Sam III M.D On: 10/18/2020 21:21  No results found for this or any previous visit.  No results found for this or any previous visit.  No results found for this or any previous visit.   Assessment & Plan:    1. Nephrolithiasis (Primary) Dietary handout given -patient to followup prn - Urinalysis, Routine w reflex microscopic   No follow-ups on file.  Wilkie Aye, MD  [facility] [place]

## 2023-05-24 ENCOUNTER — Ambulatory Visit: Payer: Medicare Other | Admitting: Internal Medicine

## 2023-05-24 ENCOUNTER — Encounter: Payer: Self-pay | Admitting: Internal Medicine

## 2023-05-24 VITALS — BP 132/84 | HR 100 | Ht 68.5 in | Wt 306.0 lb

## 2023-05-24 DIAGNOSIS — E039 Hypothyroidism, unspecified: Secondary | ICD-10-CM | POA: Diagnosis not present

## 2023-05-24 NOTE — Progress Notes (Signed)
 Name: Dustin Bertsch Sr.  MRN/ DOB: [id number], 06/07/1965    Age/ Sex: 58 y.o., male    PCP: Dustin Lowers, MD   Reason for Endocrinology Evaluation: Subclinical hypothyroidism     Date of Initial Endocrinology Evaluation: 01/13/2022    HPI: [name] is a 58 y.o. male with a past medical history of Hx of tonsillar cancer, OSA. The patient presented for initial endocrinology clinic visit on 01/13/2022 for consultative assistance with his Subclinical Hypothyroidism.   Patient has been diagnosed with right  tonsillar cancer (Sq. Cell carcinoma)  in June 2021, he is S/P right neck dissection, chemo/XRT  During evaluation for fatigue by oncology, the patient has been noted with an elevated TSH in August 2022, TSH max level 5.67 uIU/ml in January 2023, this has been attributed to prior radiation treatment of the neck  On his initial visit to our clinic he was started on levothyroxine  due to a TSH of 9.99 u IU/mL with borderline free T4 at 0.65 NG/DL (0/7976)   Anti-Tpo negative  No Fh of thyroid  disease   SUBJECTIVE:    Today (05/24/23): Dustin Burrow Sr. is here for follow-up on hypothyroidism.     Patient continues to follow-up with Dr. Jesus for oropharyngeal cancer , no evidence of recurrence He has been following up with urology for nephrolithiasis Weight has trended up  Denies local neck swelling  Knee sx is postponed until further weight loss is accomplished  Denies palpitation  Denies local neck swelling  Denies constipation or diarrhea except yesterday when he had loose stools  Energy  has been low  Doesn't sleep well at night    Levothyroxine  75 mcg daily   HISTORY:  Past Medical History:  Past Medical History:  Diagnosis Date   Arthritis    Change in stool 02/22/2017   Family history of cancer    Family history of uterine cancer    Gout 02/22/2017   Hyperlipidemia    Sleep apnea    Uses CPAP   Tonsil cancer (HCC) DX'd 07/2019    metastatic   Past Surgical History:  Past Surgical History:  Procedure Laterality Date   CARPAL TUNNEL RELEASE Right 04/10/2019   CHOLECYSTECTOMY N/A 09/09/2020   Procedure: LAPAROSCOPIC CHOLECYSTECTOMY;  Surgeon: Kallie Manuelita BROCKS, MD;  Location: [facility];  Service: General;  Laterality: N/A;   COLONOSCOPY N/A 02/20/2021   Procedure: COLONOSCOPY;  Surgeon: Golda Claudis PENNER, MD;  [facility];  Service: Endoscopy;  Laterality: N/A;  9:30   DIRECT LARYNGOSCOPY N/A 08/21/2019   Procedure: DIRECT LARYNGOSCOPY - WITH BX;  Surgeon: Jesus Oliphant, MD;  Location: [facility];  Service: ENT;  Laterality: N/A;   IR GASTROSTOMY TUBE MOD SED  10/13/2019   IR GASTROSTOMY TUBE REMOVAL  06/10/2020   IR IMAGING GUIDED PORT INSERTION  10/13/2019   IR REMOVAL TUN ACCESS W/ PORT W/O FL MOD SED  06/10/2020   NECK DISSECTION  09/11/2019   POLYPECTOMY  02/20/2021   Procedure: POLYPECTOMY;  Surgeon: Golda Claudis PENNER, MD;  [facility];  Service: Endoscopy;;   RADICAL NECK DISSECTION Right 09/11/2019   Procedure: RADICAL NECK DISSECTION;  Surgeon: Jesus Oliphant, MD;  Location[facility];  Service: ENT;  Laterality: Right;   removal of right jugular vein in neck     TONSILLECTOMY Bilateral 08/21/2019   Procedure: TONSILLECTOMY;  Surgeon: Jesus Oliphant, MD;  Location: [facility];  Service: ENT;  Laterality: Bilateral;   UMBILICAL  HERNIA REPAIR  09/09/2020   Procedure: REPAIR UMBILICAL HERNIA;  Surgeon: Kallie Manuelita BROCKS, MD;  Location: [facility];  Service: General;;   WISDOM TOOTH EXTRACTION      Social History:  reports that he has never smoked. He has never used smokeless tobacco. He reports that he does not drink alcohol and does not use drugs. Family History: family history includes COPD in his mother; Cancer in his cousin and other family members; Cervical cancer in his maternal aunt; Lung cancer in his maternal grandfather, maternal grandmother, and another family member; Lung  cancer (age of onset: 80) in his brother; Pancreatic cancer (age of onset: 32) in his cousin; Prostate cancer in his paternal grandfather; Stomach cancer in his paternal grandmother; Stomach cancer (age of onset: 57) in his cousin; Uterine cancer (age of onset: 43) in his mother.   HOME MEDICATIONS: Allergies as of 05/24/2023   No Known Allergies      Medication List        Accurate as of May 24, 2023  9:37 AM. If you have any questions, ask your nurse or doctor.          Colchicine  0.6 MG Caps Commonly known as: Mitigare  Take every two hours at onset of gout attack until pain resolves or 6 have been taken. Then decrease to two daily until flare ends What changed:  how much to take how to take this when to take this reasons to take this   fluticasone 50 MCG/ACT nasal spray Commonly known as: FLONASE Place 2 sprays into both nostrils daily as needed for allergies.   ibuprofen  200 MG tablet Commonly known as: ADVIL  Take 600 mg by mouth every 8 (eight) hours as needed for fever, headache or mild pain.   levothyroxine  75 MCG tablet Commonly known as: SYNTHROID  Take 1 tablet (75 mcg total) by mouth daily.   loratadine  10 MG tablet Commonly known as: CLARITIN  Take 10 mg by mouth daily.   meloxicam 15 MG tablet Commonly known as: MOBIC Take 15 mg by mouth daily as needed for pain.   predniSONE  10 MG tablet Commonly known as: DELTASONE  Take 5 daily for 2 days followed by 4,3,2 and 1 for 2 days each.          REVIEW OF SYSTEMS: A comprehensive ROS was conducted with the patient and is negative except as per HPI     OBJECTIVE:  VS: BP 132/84 (BP Location: Left Arm, Patient Position: Sitting, Cuff Size: Large)   Pulse 100   Ht 5' 8.5 (1.74 m)   Wt (!) 306 lb (138.8 kg)   SpO2 96%   BMI 45.85 kg/m    Wt Readings from Last 3 Encounters:  05/24/23 (!) 306 lb (138.8 kg)  03/23/23 (!) 300 lb 12.8 oz (136.4 kg)  01/05/23 299 lb 9.6 oz (135.9 kg)      EXAM: General: Pt appears well and is in NAD  Neck: General: Supple without adenopathy. Thyroid :  No goiter or nodules appreciated.   Lungs: Clear with good BS bilat   Heart: Auscultation: RRR.  Extremities:  BL LE: No pretibial edema   Mental Status: Judgment, insight: Intact Orientation: Oriented to time, place, and person Mood and affect: No depression, anxiety, or agitation     DATA REVIEWED:  Latest Reference Range & Units 05/24/23 10:02  TSH 0.40 - 4.50 mIU/L 3.52  T4,Free(Direct) 0.8 - 1.8 ng/dL 1.0      ASSESSMENT/PLAN/RECOMMENDATIONS:   Hypothyroidism:  -Patient is clinically euthyroid -  TPO Ab's negative  -TSH at goal, no change   Medications : levothyroxine  75 mcg daily     Follow-up in 1 yr    Signed electronically by: Stefano Redgie Butts, MD  [facility] Endocrinology  [facility] [address] [place] [place], [place] [address] Phone: [phone] FAX: [phone]   CC: Dustin Lowers, MD [address] [place] [address] Phone: [phone] Fax: [phone]   Return to Endocrinology clinic as below: Future Appointments  Date Time Provider Department Center  01/06/2024  7:55 AM Dustin Lowers, MD [facility] None

## 2023-05-24 NOTE — Patient Instructions (Signed)

## 2023-05-25 LAB — T4, FREE: Free T4: 1 ng/dL (ref 0.8–1.8)

## 2023-05-25 LAB — TSH: TSH: 3.52 m[IU]/L (ref 0.40–4.50)

## 2023-05-25 MED ORDER — LEVOTHYROXINE SODIUM 75 MCG PO TABS: 75.0000 ug | ORAL_TABLET | Freq: Every day | ORAL | 3 refills | Status: DC

## 2023-08-02 ENCOUNTER — Telehealth: Payer: Self-pay | Admitting: Pulmonary Disease

## 2023-08-02 DIAGNOSIS — G4733 Obstructive sleep apnea (adult) (pediatric): Secondary | ICD-10-CM

## 2023-08-02 NOTE — Telephone Encounter (Signed)
 Patient needs a prescription for his CPAP to be sent to [facility]

## 2023-08-02 NOTE — Telephone Encounter (Signed)
 Rx sent and pt notified.

## 2023-09-21 ENCOUNTER — Encounter: Payer: Self-pay | Admitting: Hematology

## 2023-09-22 ENCOUNTER — Ambulatory Visit: Admitting: Primary Care

## 2023-09-22 VITALS — BP 136/82 | HR 95 | Ht 68.0 in | Wt 308.0 lb

## 2023-09-22 DIAGNOSIS — Z9981 Dependence on supplemental oxygen: Secondary | ICD-10-CM

## 2023-09-22 DIAGNOSIS — G4733 Obstructive sleep apnea (adult) (pediatric): Secondary | ICD-10-CM

## 2023-09-22 NOTE — Patient Instructions (Addendum)
 -SEVERE OBSTRUCTIVE SLEEP APNEA: Severe obstructive sleep apnea is a condition where your airway becomes blocked during sleep, causing breathing pauses. Your condition is well-controlled with your current CPAP therapy, which you use consistently. We discussed the possibility of using a travel CPAP for convenience during outdoor activities and exploring solar generators for power. We also talked about the [facility] device, but it is not suitable for you due to your BMI and previous surgeries. Continue using your CPAP machine nightly with the current settings. We can also arrange virtual visits for your annual check-ins if that is more convenient for you.  -HEAD AND NECK CANCER: You have a history of head and neck cancer, which required surgery, chemotherapy, and radiation. This has resulted in scarring and nerve damage, which affects potential surgical treatments for your sleep apnea. We will continue to monitor your condition and manage any complications that arise.  INSTRUCTIONS: Continue using your CPAP machine nightly with the current settings. Consider purchasing a travel CPAP ([employer]) for convenience during travel or outdoor activities, and explore the use of solar generators for CPAP power during outdoor activities. We can arrange virtual visits for your annual check-ins if that is more convenient for you, but ensure you are in [place]  during the visit.  Orders: Renew CPAP supplies with DME   Follow-up 1 year with Dr. Villa Greaser or [facility] NP    Sleep Apnea  Sleep apnea is a condition that affects your breathing while you are sleeping. Your tongue or soft tissue in your throat may block the flow of air while you sleep. You may have shallow breathing or stop breathing for short periods of time. People with sleep apnea may snore loudly. There are three kinds of sleep apnea: Obstructive sleep apnea. This kind is caused by a blocked or collapsed airway. This is the most  common. Central sleep apnea. This kind happens when the part of the brain that controls breathing does not send the correct signals to the muscles that control breathing. Mixed sleep apnea. This is a combination of obstructive and central sleep apnea. What are the causes? The most common cause of sleep apnea is a collapsed or blocked airway. What increases the risk? Being very overweight. Having family members with sleep apnea. Having a tongue or tonsils that are larger than normal. Having a small airway or jaw problems. Being older. What are the signs or symptoms? Loud snoring. Restless sleep. Trouble staying asleep. Being sleepy or tired during the day. Waking up gasping or choking. Having a headache in the morning. Mood swings. Having a hard time remembering things and concentrating. How is this diagnosed? A medical history. A physical exam. A sleep study. This is also called a polysomnography test. This test is done at a sleep lab or in your home while you are sleeping. How is this treated? Treatment may include: Sleeping on your side. Losing weight if you're overweight. Wearing an oral appliance. This is a mouthpiece that moves your lower jaw forward. Using a positive airway pressure (PAP) device to keep your airways open while you sleep, such as: A continuous positive airway pressure (CPAP) device. This device gives forced air through a mask when you breathe out. This keeps your airways open. A bilevel positive airway pressure (BIPAP) device. This device gives forced air through a mask when you breathe in and when you breathe out to keep your airways open. Having surgery if other treatments do not work. If your sleep apnea is not treated,  you may be at risk for: Heart failure. Heart attack. Stroke. Type 2 diabetes or a problem with your blood sugar called insulin resistance. Follow these instructions at home: Medicines Take your medicines only as told by your health care  provider. Avoid alcohol, medicines to help you relax, and certain pain medicines. These may make sleep apnea worse. General instructions Do not smoke, vape, or use products with nicotine or tobacco in them. If you need help quitting, talk with your provider. If you were given a PAP device to open your airway while you sleep, use it as told by your provider. If you're having surgery, make sure to tell your provider you have sleep apnea. You may need to bring your PAP device with you. Contact a health care provider if: The PAP device that you were given to use during sleep bothers you or does not seem to be working. You do not feel better or you feel worse. Get help right away if: You have trouble breathing. You have chest pain. You have trouble talking. One side of your body feels weak. A part of your face is hanging down. These symptoms may be an emergency. Call 911 right away. Do not wait to see if the symptoms will go away. Do not drive yourself to the hospital. This information is not intended to replace advice given to you by your health care provider. Make sure you discuss any questions you have with your health care provider. Document Revised: 01/28/2023 Document Reviewed: 07/02/2022 Elsevier Patient Education  2024 [employer].

## 2023-09-22 NOTE — Progress Notes (Signed)
 @Patient  ID: Dustin Husband Sr., male    DOB: [birth year], 58 y.o.   MRN: [id number]  No chief complaint on file.   Referring provider: Roise Cleaver, MD  HPI: 58 year old male, never smoked. PMH significant for OSA, hyperlipidemia, gout, squamous cell carcinoma head and neck, obesity. Patient of Dr. Villa Greaser.  09/22/2023 - Interim hx  Discussed the use of AI scribe software for clinical note transcription with the patient, who gave verbal consent to proceed.  History of Present Illness   Dustin Zeitz Sr. is a 58 year old male with severe sleep apnea who presents for a follow-up visit.  He has a history of severe sleep apnea diagnosed approximately 20 years ago and is currently on his third CPAP machine, which he uses consistently, averaging 7 hours and 42 minutes per night. The CPAP machine is set to auto-adjust with a pressure range of 10 to 15, typically using a maximum pressure of 13.6. His apnea index score is 0.9, indicating well-controlled apnea. He has no issues with the machine and recently received a new mask.  In 2023, he underwent a repeat sleep study following major surgery for head and neck cancer. This surgery altered his sleep patterns; previously, he would wake up coughing and gagging shortly after falling asleep. Post-surgery, he can sleep for an hour or more without waking. His initial apnea index was 48, which has improved significantly with CPAP use.  He uses a full face mask with his CPAP and receives supplies from [employer]. He is interested in options for sleeping outdoors, such as a travel CPAP or solar generators, as he enjoys Production assistant, radio and outdoor activities.  He is currently disabled and retired, having not worked since December 2020.     Airview download 08/23/23-09/21/23 Usage days 30/30 days Average usage 7 hours 42 mins Pressure 10-15cm h20 (12.6cm h20-95%) Airleaks 0.4L/min (95%) AHI 0.9     No Known Allergies  Immunization History   Administered Date(s) Administered   Moderna Sars-Covid-2 Vaccination 08/03/2019, 09/05/2019   Tdap 06/08/2019    Past Medical History:  Diagnosis Date   Arthritis    Change in stool 02/22/2017   Family history of cancer    Family history of uterine cancer    Gout 02/22/2017   Hyperlipidemia    Sleep apnea    Uses CPAP   Tonsil cancer (HCC) DX'd 07/2019   metastatic    Tobacco History: Social History   Tobacco Use  Smoking Status Never  Smokeless Tobacco Never   Counseling given: Not Answered   Outpatient Medications Prior to Visit  Medication Sig Dispense Refill   Colchicine  (MITIGARE ) 0.6 MG CAPS Take every two hours at onset of gout attack until pain resolves or 6 have been taken. Then decrease to two daily until flare ends (Patient taking differently: Take 0.6 mg by mouth daily as needed (gout). Take every two hours at onset of gout attack until pain resolves or 6 have been taken. Then decrease to two daily until flare ends) 30 capsule 11   fluticasone (FLONASE) 50 MCG/ACT nasal spray Place 2 sprays into both nostrils daily as needed for allergies.     ibuprofen  (ADVIL ) 200 MG tablet Take 600 mg by mouth every 8 (eight) hours as needed for fever, headache or mild pain.     levothyroxine  (SYNTHROID ) 75 MCG tablet Take 1 tablet (75 mcg total) by mouth daily. 90 tablet 3   loratadine  (CLARITIN ) 10 MG tablet Take 10 mg by mouth daily.  meloxicam (MOBIC) 15 MG tablet Take 15 mg by mouth daily as needed for pain.     predniSONE  (DELTASONE ) 10 MG tablet Take 5 daily for 2 days followed by 4,3,2 and 1 for 2 days each. (Patient not taking: Reported on 05/24/2023) 30 tablet 0   No facility-administered medications prior to visit.   Review of Systems  Review of Systems  Constitutional: Negative.   HENT: Negative.    Respiratory: Negative.    Cardiovascular: Negative.    Physical Exam  There were no vitals taken for this visit. Physical Exam Constitutional:       Appearance: Normal appearance. He is not ill-appearing.  HENT:     Head: Normocephalic and atraumatic.  Neck:     Comments: Neck scarring from previous surgeries  Cardiovascular:     Rate and Rhythm: Normal rate and regular rhythm.  Pulmonary:     Effort: Pulmonary effort is normal.     Breath sounds: Normal breath sounds.  Musculoskeletal:        General: Normal range of motion.  Skin:    General: Skin is warm and dry.  Neurological:     General: No focal deficit present.     Mental Status: He is alert and oriented to person, place, and time. Mental status is at baseline.  Psychiatric:        Mood and Affect: Mood normal.        Behavior: Behavior normal.        Thought Content: Thought content normal.        Judgment: Judgment normal.      Lab Results:  CBC    Component Value Date/Time   WBC 4.5 01/05/2023 0949   WBC 6.4 05/20/2022 1335   WBC 5.6 05/15/2021 1329   RBC 5.20 01/05/2023 0949   RBC 5.22 05/20/2022 1335   HGB 15.6 01/05/2023 0949   HCT 46.2 01/05/2023 0949   PLT 193 01/05/2023 0949   MCV 89 01/05/2023 0949   MCH 30.0 01/05/2023 0949   MCH 30.3 05/20/2022 1335   MCHC 33.8 01/05/2023 0949   MCHC 34.4 05/20/2022 1335   RDW 13.2 01/05/2023 0949   LYMPHSABS 1.0 01/05/2023 0949   MONOABS 0.6 05/20/2022 1335   EOSABS 0.1 01/05/2023 0949   BASOSABS 0.0 01/05/2023 0949    BMET    Component Value Date/Time   NA 139 01/05/2023 0949   K 4.4 01/05/2023 0949   CL 100 01/05/2023 0949   CO2 25 01/05/2023 0949   GLUCOSE 85 01/05/2023 0949   GLUCOSE 79 05/20/2022 1335   BUN 19 01/05/2023 0949   CREATININE 1.34 (H) 01/05/2023 0949   CREATININE 1.37 (H) 05/20/2022 1335   CALCIUM 9.4 01/05/2023 0949   GFRNONAA >60 05/20/2022 1335   GFRAA >60 02/08/2020 1005   GFRAA >60 12/05/2019 1330    BNP No results found for: "BNP"  ProBNP No results found for: "PROBNP"  Imaging: No results found.   Assessment & Plan:   1. OSA (obstructive sleep apnea)  (Primary)   Assessment and Plan    Severe obstructive sleep apnea Severe obstructive sleep apnea, well-controlled with CPAP therapy. Compliance report shows 100% usage with an average of 7 hours and 42 minutes per night. Pressure settings are auto set between 10 to 15, with  apnea index score is 0.9/hour, indicating well-controlled apnea. Discussed Inspire device as an alternative treatment, but current BMI exceeds eligibility limit, and potential complications due to scarring from previous head/neck surgeries may complicate the  procedure. - Continue CPAP therapy nightly with current settings. - Consider purchasing a travel CPAP ([employer]) for convenience during travel or outdoor activities or explore the use of solar generators for CPAP power during outdoor activities. - Offer virtual visits for annual check-ins if more convenient, ensuring presence in [place]  during the visit.   Antonio Baumgarten, NP 09/22/2023

## 2024-01-05 ENCOUNTER — Ambulatory Visit

## 2024-01-05 VITALS — BP 136/82 | HR 95 | Ht 68.0 in | Wt 308.0 lb

## 2024-01-05 DIAGNOSIS — Z0000 Encounter for general adult medical examination without abnormal findings: Secondary | ICD-10-CM | POA: Diagnosis not present

## 2024-01-05 NOTE — Patient Instructions (Signed)
 Mr. Dustin Rios , Thank you for taking time out of your busy schedule to complete your Annual Wellness Visit with me. I enjoyed our conversation and look forward to speaking with you again next year. I, as well as your care team,  appreciate your ongoing commitment to your health goals. Please review the following plan we discussed and let me know if I can assist you in the future. Your Game plan/ To Do List    Referrals: If you haven't heard from the office you've been referred to, please reach out to them at the phone provided.   Follow up Visits: We will see or speak with you next year for your Next Medicare AWV with our clinical staff on 01/05/26 at 9:20a.m. Have you seen your provider in the last 6 months (3 months if uncontrolled diabetes)? Yes  Clinician Recommendations:  Aim for 30 minutes of exercise or brisk walking, 6-8 glasses of water, and 5 servings of fruits and vegetables each day.       This is a list of the screenings recommended for you:  Health Maintenance  Topic Date Due   HIV Screening  Never done   Hepatitis C Screening  Never done   Hepatitis B Vaccine (1 of 3 - 19+ 3-dose series) Never done   Zoster (Shingles) Vaccine (1 of 2) Never done   Pneumococcal Vaccine for age over 39 (1 of 1 - PCV) Never done   COVID-19 Vaccine (3 - Moderna risk series) 10/03/2019   Medicare Annual Wellness Visit  10/08/2023   Flu Shot  12/10/2023   Colon Cancer Screening  02/20/2026   DTaP/Tdap/Td vaccine (2 - Td or Tdap) 06/07/2029   HPV Vaccine  Aged Out   Meningitis B Vaccine  Aged Out    Advanced directives: (Declined) Advance directive discussed with you today. Even though you declined this today, please call our office should you change your mind, and we can give you the proper paperwork for you to fill out. Advance Care Planning is important because it:  [x]  Makes sure you receive the medical care that is consistent with your values, goals, and preferences  [x]  It provides  guidance to your family and loved ones and reduces their decisional burden about whether or not they are making the right decisions based on your wishes.  Follow the link provided in your after visit summary or read over the paperwork we have mailed to you to help you started getting your Advance Directives in place. If you need assistance in completing these, please reach out to us  so that we can help you!  See attachments for Preventive Care and Fall Prevention Tips.

## 2024-01-05 NOTE — Progress Notes (Signed)
 Subjective:   Dustin Jankowski Sr. is a 58 y.o. who presents for a Medicare Wellness preventive visit.  As a reminder, Annual Wellness Visits don't include a physical exam, and some assessments may be limited, especially if this visit is performed virtually. We may recommend an in-person follow-up visit with your provider if needed.  Visit Complete: Virtual I connected with  Dustin Gahm Sr. on 01/05/24 by a audio enabled telemedicine application and verified that I am speaking with the correct person using two identifiers.  Patient Location: Home  Provider Location: Home Office  I discussed the limitations of evaluation and management by telemedicine. The patient expressed understanding and agreed to proceed.  Vital Signs: Because this visit was a virtual/telehealth visit, some criteria may be missing or patient reported. Any vitals not documented were not able to be obtained and vitals that have been documented are patient reported.  VideoDeclined- This patient declined Librarian, academic. Therefore the visit was completed with audio only.  Persons Participating in Visit: Patient.  AWV Questionnaire: No: Patient Medicare AWV questionnaire was not completed prior to this visit.  Cardiac Risk Factors include: advanced age (>28men, >4 women);dyslipidemia;male gender;obesity (BMI >30kg/m2)     Objective:    Today's Vitals   01/05/24 0925  BP: 136/82  Pulse: 95  Weight: (!) 308 lb (139.7 kg)  Height: 5' 8 (1.727 m)  PainSc: 2    Body mass index is 46.83 kg/m.     01/05/2024    9:59 AM 10/08/2022    1:21 PM 03/07/2021    2:33 PM 02/20/2021    8:23 AM 09/09/2020    9:24 AM 09/05/2020    2:03 PM 08/07/2020    9:32 PM  Advanced Directives  Does Patient Have a Medical Advance Directive? No No No No No No No  Would patient like information on creating a medical advance directive?  No - Patient declined No - Patient declined No - Patient declined No -  Patient declined No - Patient declined No - Patient declined    Current Medications (verified) Outpatient Encounter Medications as of 01/05/2024  Medication Sig   Colchicine  (MITIGARE ) 0.6 MG CAPS Take every two hours at onset of gout attack until pain resolves or 6 have been taken. Then decrease to two daily until flare ends   fluticasone (FLONASE) 50 MCG/ACT nasal spray Place 2 sprays into both nostrils daily as needed for allergies.   ibuprofen  (ADVIL ) 200 MG tablet Take 600 mg by mouth every 8 (eight) hours as needed for fever, headache or mild pain.   levothyroxine  (SYNTHROID ) 75 MCG tablet Take 1 tablet (75 mcg total) by mouth daily.   loratadine  (CLARITIN ) 10 MG tablet Take 10 mg by mouth daily.   meloxicam (MOBIC) 15 MG tablet Take 15 mg by mouth daily as needed for pain.   predniSONE  (DELTASONE ) 10 MG tablet Take 5 daily for 2 days followed by 4,3,2 and 1 for 2 days each. (Patient not taking: Reported on 01/05/2024)   No facility-administered encounter medications on file as of 01/05/2024.    Allergies (verified) Patient has no known allergies.   History: Past Medical History:  Diagnosis Date   Arthritis    Change in stool 02/22/2017   Family history of cancer    Family history of uterine cancer    Gout 02/22/2017   Hyperlipidemia    Sleep apnea    Uses CPAP   Tonsil cancer (HCC) DX'd 07/2019   metastatic   Past  Surgical History:  Procedure Laterality Date   CARPAL TUNNEL RELEASE Right 04/10/2019   CHOLECYSTECTOMY N/A 09/09/2020   Procedure: LAPAROSCOPIC CHOLECYSTECTOMY;  Surgeon: Kallie Manuelita BROCKS, MD;  Location: [facility];  Service: General;  Laterality: N/A;   COLONOSCOPY N/A 02/20/2021   Procedure: COLONOSCOPY;  Surgeon: Golda Claudis PENNER, MD;  Location: [facility];  Service: Endoscopy;  Laterality: N/A;  9:30   DIRECT LARYNGOSCOPY N/A 08/21/2019   Procedure: DIRECT LARYNGOSCOPY - WITH BX;  Surgeon: Jesus Oliphant, MD;  Location: [facility];  Service: ENT;   Laterality: N/A;   IR GASTROSTOMY TUBE MOD SED  10/13/2019   IR GASTROSTOMY TUBE REMOVAL  06/10/2020   IR IMAGING GUIDED PORT INSERTION  10/13/2019   IR REMOVAL TUN ACCESS W/ PORT W/O FL MOD SED  06/10/2020   NECK DISSECTION  09/11/2019   POLYPECTOMY  02/20/2021   Procedure: POLYPECTOMY;  Surgeon: Golda Claudis PENNER, MD;  Location: [facility];  Service: Endoscopy;;   RADICAL NECK DISSECTION Right 09/11/2019   Procedure: RADICAL NECK DISSECTION;  Surgeon: Jesus Oliphant, MD;  Location: [facility];  Service: ENT;  Laterality: Right;   removal of right jugular vein in neck     TONSILLECTOMY Bilateral 08/21/2019   Procedure: TONSILLECTOMY;  Surgeon: Jesus Oliphant, MD;  Location: [facility];  Service: ENT;  Laterality: Bilateral;   UMBILICAL HERNIA REPAIR  09/09/2020   Procedure: REPAIR UMBILICAL HERNIA;  Surgeon: Kallie Manuelita BROCKS, MD;  Location: [facility];  Service: General;;   WISDOM TOOTH EXTRACTION     Family History  Problem Relation Age of Onset   COPD Mother    Uterine cancer Mother 37   Lung cancer Brother 78       smoker   Lung cancer Maternal Grandfather    Stomach cancer Paternal Grandmother        d. 69s   Cervical cancer Maternal Aunt    Lung cancer Maternal Grandmother        smoker   Prostate cancer Paternal Grandfather    Pancreatic cancer Cousin 28       mother's maternal first cousin   Stomach cancer Cousin 51       mother's maternal first cousin   Cancer Cousin        mother's maternal first cousin with NOS cancer   Cancer Other        MGMs sister with cancer NOS   Lung cancer Other        smoker   Cancer Other        mother's maternal grandfather with NOS cancer   Social History   Socioeconomic History   Marital status: Married    Spouse name: [name]   Number of children: 1   Years of education: Not on file   Highest education level: Some college, no degree  Occupational History   Occupation: Personnel officer   Occupation: disabled  Tobacco Use   Smoking  status: Never   Smokeless tobacco: Never  Vaping Use   Vaping status: Never Used  Substance and Sexual Activity   Alcohol use: No   Drug use: No   Sexual activity: Not on file  Other Topics Concern   Not on file  Social History Narrative   Patient is married and has 1 grown son.   Patient has never smoked, used tobacco products.   Patient does not drink.  Patient does not use illicit drugs.   Social Drivers of Corporate investment banker Strain:  Low Risk  (01/05/2024)   Overall Financial Resource Strain (CARDIA)    Difficulty of Paying Living Expenses: Not hard at all  Food Insecurity: No Food Insecurity (01/05/2024)   Hunger Vital Sign    Worried About Running Out of Food in the Last Year: Never true    Ran Out of Food in the Last Year: Never true  Transportation Needs: No Transportation Needs (01/05/2024)   PRAPARE - Administrator, Civil Service (Medical): No    Lack of Transportation (Non-Medical): No  Physical Activity: Inactive (01/05/2024)   Exercise Vital Sign    Days of Exercise per Week: 0 days    Minutes of Exercise per Session: 0 min  Stress: No Stress Concern Present (01/05/2024)   [name] of Occupational Health - Occupational Stress Questionnaire    Feeling of Stress: Not at all  Social Connections: Moderately Integrated (01/05/2024)   Social Connection and Isolation Panel    Frequency of Communication with Friends and Family: Once a week    Frequency of Social Gatherings with Friends and Family: Once a week    Attends Religious Services: 1 to 4 times per year    Active Member of [employer] or Organizations: Yes    Attends Banker Meetings: 1 to 4 times per year    Marital Status: Married    Tobacco Counseling Counseling given: Yes    Clinical Intake:  Pre-visit preparation completed: Yes  Pain : 0-10 (r-knee) Pain Score: 2  Pain Type: Chronic pain Pain Location: Knee Pain Orientation: Right Pain Descriptors / Indicators:  Constant Pain Onset: Other (comment) Pain Frequency: Constant     BMI - recorded: 46.83 Nutritional Status: BMI > 30  Obese Nutritional Risks: None Diabetes: No  Lab Results  Component Value Date   HGBA1C 5.7 04/03/2019     How often do you need to have someone help you when you read instructions, pamphlets, or other written materials from your doctor or pharmacy?: 1 - Never  Interpreter Needed?: No  Information entered by :: alia t/cma   Activities of Daily Living     01/05/2024    9:35 AM  In your present state of health, do you have any difficulty performing the following activities:  Hearing? 1  Vision? 1  Difficulty concentrating or making decisions? 0  Walking or climbing stairs? 0  Dressing or bathing? 0  Doing errands, shopping? 0  Preparing Food and eating ? N  Using the Toilet? N  In the past six months, have you accidently leaked urine? N  Do you have problems with loss of bowel control? N  Managing your Medications? N  Managing your Finances? N  Housekeeping or managing your Housekeeping? N    Patient Care Team: Zollie Lowers, MD as PCP - General (Family Medicine) Arlana Arnt, MD as Consulting Physician (Otolaryngology) Izell Domino, MD as Attending Physician (Radiation Oncology) Maryelizabeth Callander, MD (Inactive) as Consulting Physician (Hematology) Malmfelt, Delon CROME, RN as Oncology Nurse Navigator (Oncology) Hope Almarie ORN, NP as Nurse Practitioner (Pulmonary Disease)  I have updated your Care Teams any recent Medical Services you may have received from other providers in the past year.     Assessment:   This is a routine wellness examination for Dustin Rios.  Hearing/Vision screen Hearing Screening - Comments:: Pt have some hearing loss in both ears Vision Screening - Comments:: Pt need glasses/pt goes [facility] in [place], [place]/2025   Goals Addressed  This Visit's Progress    patient stated       Would like to sell his little house  in [place] (rental property) to bring in some income/plus so he don't have to worry about it       Depression Screen     01/05/2024    9:47 AM 03/23/2023    9:24 AM 03/23/2023    9:14 AM 01/05/2023    9:09 AM 10/08/2022    1:22 PM 08/05/2022   11:54 AM 12/31/2020    1:17 PM  PHQ 2/9 Scores  PHQ - 2 Score 0 0 0 0 0 0 1  PHQ- 9 Score  4    3 5     Fall Risk     01/05/2024    9:57 AM 03/23/2023    9:23 AM 03/23/2023    9:14 AM 01/05/2023    9:09 AM 10/08/2022    1:22 PM  Fall Risk   Falls in the past year? 0 0 0 0 0  Number falls in past yr: 0      Injury with Fall? 0      Risk for fall due to : No Fall Risks      Follow up Falls evaluation completed        MEDICARE RISK AT HOME:  Medicare Risk at Home Any stairs in or around the home?: Yes If so, are there any without handrails?: Yes Home free of loose throw rugs in walkways, pet beds, electrical cords, etc?: Yes Adequate lighting in your home to reduce risk of falls?: Yes Life alert?: No Use of a cane, walker or w/c?: No Grab bars in the bathroom?: Yes Shower chair or bench in shower?: Yes Elevated toilet seat or a handicapped toilet?: Yes  TIMED UP AND GO:  Was the test performed?  no  Cognitive Function: 6CIT completed        01/05/2024    9:47 AM 10/08/2022    1:24 PM  6CIT Screen  What Year? 0 points 0 points  What month? 0 points 0 points  What time? 0 points 0 points  Count back from 20 0 points 0 points  Months in reverse 0 points 0 points  Repeat phrase 0 points 0 points  Total Score 0 points 0 points    Immunizations Immunization History  Administered Date(s) Administered   Moderna Sars-Covid-2 Vaccination 08/03/2019, 09/05/2019   Tdap 06/08/2019    Screening Tests Health Maintenance  Topic Date Due   HIV Screening  Never done   Hepatitis C Screening  Never done   Hepatitis B Vaccines 19-59 Average Risk (1 of 3 - 19+ 3-dose series) Never done   Zoster Vaccines- Shingrix (1 of 2) Never done    Pneumococcal Vaccine: 50+ Years (1 of 1 - PCV) Never done   COVID-19 Vaccine (3 - Moderna risk series) 10/03/2019   INFLUENZA VACCINE  12/10/2023   Medicare Annual Wellness (AWV)  01/04/2025   Colonoscopy  02/20/2026   DTaP/Tdap/Td (2 - Td or Tdap) 06/07/2029   HPV VACCINES  Aged Out   Meningococcal B Vaccine  Aged Out    Health Maintenance  Health Maintenance Due  Topic Date Due   HIV Screening  Never done   Hepatitis C Screening  Never done   Hepatitis B Vaccines 19-59 Average Risk (1 of 3 - 19+ 3-dose series) Never done   Zoster Vaccines- Shingrix (1 of 2) Never done   Pneumococcal Vaccine: 50+ Years (1 of 1 - PCV)  Never done   COVID-19 Vaccine (3 - Moderna risk series) 10/03/2019   INFLUENZA VACCINE  12/10/2023   Health Maintenance Items Addressed: See Nurse Notes at the end of this note  Additional Screening:  Vision Screening: Recommended annual ophthalmology exams for early detection of glaucoma and other disorders of the eye. Would you like a referral to an eye doctor? No    Dental Screening: Recommended annual dental exams for proper oral hygiene  Community Resource Referral / Chronic Care Management: CRR required this visit?  No   CCM required this visit?  No   Plan:    I have personally reviewed and noted the following in the patient's chart:   Medical and social history Use of alcohol, tobacco or illicit drugs  Current medications and supplements including opioid prescriptions. Patient is not currently taking opioid prescriptions. Functional ability and status Nutritional status Physical activity Advanced directives List of other physicians Hospitalizations, surgeries, and ER visits in previous 12 months Vitals Screenings to include cognitive, depression, and falls Referrals and appointments  In addition, I have reviewed and discussed with patient certain preventive protocols, quality metrics, and best practice recommendations. A written  personalized care plan for preventive services as well as general preventive health recommendations were provided to patient.   Dustin Rios, CMA   01/05/2024   After Visit Summary: (MyChart) Due to this being a telephonic visit, the after visit summary with patients personalized plan was offered to patient via MyChart   Notes: Nothing significant to report at this time.

## 2024-01-06 ENCOUNTER — Encounter: Payer: Medicare Other | Admitting: Family Medicine

## 2024-01-17 ENCOUNTER — Encounter: Payer: Self-pay | Admitting: Family Medicine

## 2024-01-17 ENCOUNTER — Ambulatory Visit: Admitting: Family Medicine

## 2024-01-17 VITALS — BP 134/79 | HR 70 | Temp 97.8°F | Ht 68.0 in | Wt 312.0 lb

## 2024-01-17 DIAGNOSIS — Z0000 Encounter for general adult medical examination without abnormal findings: Secondary | ICD-10-CM

## 2024-01-17 DIAGNOSIS — E782 Mixed hyperlipidemia: Secondary | ICD-10-CM | POA: Diagnosis not present

## 2024-01-17 DIAGNOSIS — M25561 Pain in right knee: Secondary | ICD-10-CM

## 2024-01-17 DIAGNOSIS — E039 Hypothyroidism, unspecified: Secondary | ICD-10-CM

## 2024-01-17 DIAGNOSIS — G8929 Other chronic pain: Secondary | ICD-10-CM

## 2024-01-17 DIAGNOSIS — Z0001 Encounter for general adult medical examination with abnormal findings: Secondary | ICD-10-CM

## 2024-01-17 DIAGNOSIS — Z125 Encounter for screening for malignant neoplasm of prostate: Secondary | ICD-10-CM

## 2024-01-17 DIAGNOSIS — R0789 Other chest pain: Secondary | ICD-10-CM | POA: Diagnosis not present

## 2024-01-17 DIAGNOSIS — E559 Vitamin D deficiency, unspecified: Secondary | ICD-10-CM

## 2024-01-17 LAB — URINALYSIS
Bilirubin, UA: NEGATIVE
Glucose, UA: NEGATIVE
Ketones, UA: NEGATIVE
Leukocytes,UA: NEGATIVE
Nitrite, UA: NEGATIVE
Protein,UA: NEGATIVE
RBC, UA: NEGATIVE
Specific Gravity, UA: 1.025 (ref 1.005–1.030)
Urobilinogen, Ur: 0.2 mg/dL (ref 0.2–1.0)
pH, UA: 5.5 (ref 5.0–7.5)

## 2024-01-17 NOTE — Progress Notes (Addendum)
 Subjective:  Patient ID: Dustin Burrow Sr., male    DOB: Jan 02, 1966  Age: 58 y.o. MRN: [id number]  CC: Annual Exam   HPI  Discussed the use of AI scribe software for clinical note transcription with the patient, who gave verbal consent to proceed.  History of Present Illness Dustin Rosenberger Sr. is a 58 year old male who presents for an annual physical exam and evaluation for knee surgery.  He experiences significant right knee pain due to 'bone on bone' osteoarthritis and has been told he needs a knee replacement. Surgery has been delayed due to his weight. He was previously prescribed medication for weight loss, but insurance issues prevented him from obtaining it. He currently weighs approximately 312 pounds and is 5 feet 8 inches tall. He takes meloxicam as needed for knee pain, especially during increased activity, such as vacations. He has received knee injections in the past, which initially provided relief, but the effectiveness has diminished over time.  He has a history of thyroid  problems, which he acknowledges contribute to his weight gain. He is not currently on any specific medication for weight loss due to insurance coverage issues. He previously attempted a high-protein, low-carb diet with his wife, but found it challenging to maintain, especially while traveling.  He has a history of cancer in his head, treated with surgery, chemotherapy, and radiation in 2021, and has been in remission since July 2021. He experiences nerve damage on the right side of his body due to the surgery, affecting his right shoulder.  He experiences chest pain described as a cramping or bruising sensation to the left of his sternum, which does not radiate. Cold drinks can exacerbate this pain, causing it to radiate across his shoulder and down his arm, but it resolves quickly.  He has a history of gallbladder removal and umbilical hernia repair. No current issues with eating, heartburn, or abdominal  pain. He experienced a single episode of intense fatigue and weakness while walking in hot, muggy conditions without water, which resolved after hydration.  He maintains an active lifestyle, mowing lawns and gardening, but acknowledges that his activity level is not as high as when he was working. No shortness of breath or other respiratory symptoms. No issues with urination, including nocturia.          01/17/2024    8:10 AM 01/05/2024    9:47 AM 03/23/2023    9:24 AM  Depression screen PHQ 2/9  Decreased Interest 0 0 0  Down, Depressed, Hopeless 1 0 0  PHQ - 2 Score 1 0 0  Altered sleeping 1  1  Tired, decreased energy 1  1  Change in appetite 0  1  Feeling bad or failure about yourself  0  0  Trouble concentrating 0  0  Moving slowly or fidgety/restless 0  1  Suicidal thoughts 0  0  PHQ-9 Score 3  4  Difficult doing work/chores Not difficult at all  Not difficult at all    History Dustin Rios has a past medical history of Arthritis, Change in stool (02/22/2017), Family history of cancer, Family history of uterine cancer, Gout (02/22/2017), Hyperlipidemia, Sleep apnea, and Tonsil cancer (HCC) (DX'd 07/2019).   He has a past surgical history that includes Carpal tunnel release (Right, 04/10/2019); Tonsillectomy (Bilateral, 08/21/2019); Direct laryngoscopy (N/A, 08/21/2019); Wisdom tooth extraction; Neck dissection (09/11/2019); Radical neck dissection (Right, 09/11/2019); IR IMAGING GUIDED PORT INSERTION (10/13/2019); IR GASTROSTOMY TUBE MOD SED (10/13/2019); IR REMOVAL TUN ACCESS W/ PORT W/O  FL MOD SED (06/10/2020); IR GASTROSTOMY TUBE REMOVAL/REPAIR (06/10/2020); removal of right jugular vein in neck; Cholecystectomy (N/A, 09/09/2020); Umbilical hernia repair (09/09/2020); Colonoscopy (N/A, 02/20/2021); and polypectomy (02/20/2021).   His family history includes COPD in his mother; Cancer in his cousin and other family members; Cervical cancer in his maternal aunt; Lung cancer in his maternal  grandfather, maternal grandmother, and another family member; Lung cancer (age of onset: 33) in his brother; Pancreatic cancer (age of onset: 1) in his cousin; Prostate cancer in his paternal grandfather; Stomach cancer in his paternal grandmother; Stomach cancer (age of onset: 1) in his cousin; Uterine cancer (age of onset: 56) in his mother.He reports that he has never smoked. He has never used smokeless tobacco. He reports that he does not drink alcohol and does not use drugs.    ROS Review of Systems  Constitutional:  Negative for activity change, fatigue and unexpected weight change.  HENT:  Negative for congestion, ear pain, hearing loss, postnasal drip and trouble swallowing.   Eyes:  Negative for pain and visual disturbance.  Respiratory:  Negative for cough, chest tightness and shortness of breath.   Cardiovascular:  Negative for chest pain, palpitations and leg swelling.  Gastrointestinal:  Negative for abdominal distention, abdominal pain, blood in stool, constipation, diarrhea, nausea and vomiting.  Endocrine: Negative for cold intolerance, heat intolerance and polydipsia.  Genitourinary:  Negative for difficulty urinating, dysuria, flank pain, frequency and urgency.  Musculoskeletal:  Positive for arthralgias (knees). Negative for joint swelling.  Skin:  Negative for color change, rash and wound.  Neurological:  Negative for dizziness, syncope, speech difficulty, weakness, light-headedness, numbness and headaches.  Hematological:  Does not bruise/bleed easily.  Psychiatric/Behavioral:  Negative for confusion, decreased concentration, dysphoric mood and sleep disturbance. The patient is not nervous/anxious.     Objective:  BP 134/79   Pulse 70   Temp 97.8 F (36.6 C)   Ht 5' 8 (1.727 m)   Wt (!) 312 lb (141.5 kg)   SpO2 95%   BMI 47.44 kg/m   BP Readings from Last 3 Encounters:  01/17/24 134/79  01/05/24 136/82  09/22/23 136/82    Wt Readings from Last 3  Encounters:  01/17/24 (!) 312 lb (141.5 kg)  01/05/24 (!) 308 lb (139.7 kg)  09/22/23 (!) 308 lb (139.7 kg)     Physical Exam Constitutional:      Appearance: He is well-developed. He is obese.  HENT:     Head: Normocephalic and atraumatic.  Eyes:     Pupils: Pupils are equal, round, and reactive to light.  Neck:     Thyroid : No thyromegaly.     Trachea: No tracheal deviation.  Cardiovascular:     Rate and Rhythm: Normal rate and regular rhythm.     Heart sounds: Normal heart sounds. No murmur heard.    No friction rub. No gallop.  Pulmonary:     Breath sounds: Normal breath sounds. No wheezing or rales.  Abdominal:     General: Bowel sounds are normal. There is no distension.     Palpations: Abdomen is soft. There is no mass.     Tenderness: There is no abdominal tenderness.     Hernia: There is no hernia in the left inguinal area.  Genitourinary:    Penis: Normal.      Testes: Normal.  Musculoskeletal:        General: Normal range of motion.     Cervical back: Normal range of motion.  Lymphadenopathy:  Cervical: No cervical adenopathy.  Skin:    General: Skin is warm and dry.  Neurological:     Mental Status: He is alert and oriented to person, place, and time.      Assessment & Plan:  Well adult exam -     CBC with Differential/Platelet -     CMP14+EGFR -     Lipid panel -     PSA, total and free -     Urinalysis -     VITAMIN D  25 Hydroxy (Vit-D Deficiency, Fractures) -     TSH + free T4 -     EKG 12-Lead  Morbid obesity (HCC) -     CBC with Differential/Platelet -     CMP14+EGFR  Mixed hyperlipidemia -     CBC with Differential/Platelet -     CMP14+EGFR -     Lipid panel  Chronic pain of right knee -     CBC with Differential/Platelet -     CMP14+EGFR  Acquired hypothyroidism -     CBC with Differential/Platelet -     CMP14+EGFR -     TSH + free T4  Screening for prostate cancer -     PSA, total and free  Other chest pain -     EKG  12-Lead  Vitamin D  deficiency -     VITAMIN D  25 Hydroxy (Vit-D Deficiency, Fractures)  Other orders -     Tirzepatide -Weight Management; Inject 2.5 mg into the skin once a week.  Dispense: 2 mL; Refill: 1    Assessment and Plan Assessment & Plan Adult Wellness Visit   Routine annual physical examination focused on weight management, thyroid  function, and knee osteoarthritis. He is in no acute distress. Emphasized lifestyle modifications for health improvement. Order comprehensive blood work including kidney and liver functions, electrolytes, blood counts, cholesterol, and PSA test. Perform EKG to evaluate chest pain. Conduct physical examination focusing on identified concerns.  Obesity   He weighs 312 pounds and is 5'8 tall. Weight loss is needed for health improvement and knee surgery. He faces dietary challenges while traveling. Discussed [employer] Zepbound  program for weight loss medication, costing $250/month, as a self-administered injection. Refer to a dietitian for dietary counseling. Discuss potential enrollment in [employer] Zepbound  program. Consider referral to a medical weight loss center. Encourage regular exercise, including aqua aerobics or swimming.  Right knee osteoarthritis   Chronic right knee osteoarthritis causes significant pain and functional limitation. Previous injections provided temporary relief. Weight loss is necessary for surgical intervention. Recommend daily use of meloxicam with food for pain management. Encourage physical therapy to strengthen the knee and improve function. Consider purchasing a knee brace for additional support.  Hypothyroidism   Hypothyroidism contributes to weight gain. Discussed the importance of thyroid  function in weight management. Order thyroid  function tests as part of blood work.  Chest Pain   He experiences intermittent chest pain left of the sternum, non-radiating, primarily with cold drinks. Previous EKG performed; repeat EKG  planned. Differential includes possible esophageal spasm related to previous throat surgery. Perform EKG to evaluate chest pain.  Recording duration: 30 minutes       Follow-up: Return in about 6 weeks (around 02/28/2024) for weight.  Butler Der, M.D.

## 2024-01-18 ENCOUNTER — Ambulatory Visit: Payer: Self-pay | Admitting: Family Medicine

## 2024-01-18 LAB — CBC WITH DIFFERENTIAL/PLATELET
Basophils Absolute: 0 x10E3/uL (ref 0.0–0.2)
Basos: 1 %
EOS (ABSOLUTE): 0.2 x10E3/uL (ref 0.0–0.4)
Eos: 3 %
Hematocrit: 48 % (ref 37.5–51.0)
Hemoglobin: 15.8 g/dL (ref 13.0–17.7)
Immature Grans (Abs): 0 x10E3/uL (ref 0.0–0.1)
Immature Granulocytes: 0 %
Lymphocytes Absolute: 1.2 x10E3/uL (ref 0.7–3.1)
Lymphs: 19 %
MCH: 30.4 pg (ref 26.6–33.0)
MCHC: 32.9 g/dL (ref 31.5–35.7)
MCV: 92 fL (ref 79–97)
Monocytes Absolute: 0.6 x10E3/uL (ref 0.1–0.9)
Monocytes: 9 %
Neutrophils Absolute: 4.1 x10E3/uL (ref 1.4–7.0)
Neutrophils: 68 %
Platelets: 194 x10E3/uL (ref 150–450)
RBC: 5.2 x10E6/uL (ref 4.14–5.80)
RDW: 13.9 % (ref 11.6–15.4)
WBC: 6.1 x10E3/uL (ref 3.4–10.8)

## 2024-01-18 LAB — CMP14+EGFR
ALT: 24 IU/L (ref 0–44)
AST: 21 IU/L (ref 0–40)
Albumin: 4.3 g/dL (ref 3.8–4.9)
Alkaline Phosphatase: 71 IU/L (ref 44–121)
BUN/Creatinine Ratio: 17 (ref 9–20)
BUN: 23 mg/dL (ref 6–24)
Bilirubin Total: 0.4 mg/dL (ref 0.0–1.2)
CO2: 24 mmol/L (ref 20–29)
Calcium: 9.3 mg/dL (ref 8.7–10.2)
Chloride: 101 mmol/L (ref 96–106)
Creatinine, Ser: 1.36 mg/dL — ABNORMAL HIGH (ref 0.76–1.27)
Globulin, Total: 2.4 g/dL (ref 1.5–4.5)
Glucose: 94 mg/dL (ref 70–99)
Potassium: 4.8 mmol/L (ref 3.5–5.2)
Sodium: 139 mmol/L (ref 134–144)
Total Protein: 6.7 g/dL (ref 6.0–8.5)
eGFR: 60 mL/min/1.73 (ref >59)

## 2024-01-18 LAB — PSA, TOTAL AND FREE
PSA, Free Pct: 40
PSA, Free: 0.24 ng/mL
Prostate Specific Ag, Serum: 0.6 ng/mL (ref 0.0–4.0)

## 2024-01-18 LAB — LIPID PANEL
Chol/HDL Ratio: 3.7 ratio (ref 0.0–5.0)
Cholesterol, Total: 164 mg/dL (ref 100–199)
HDL: 44 mg/dL (ref >39)
LDL Chol Calc (NIH): 86 mg/dL (ref 0–99)
Triglycerides: 202 mg/dL — ABNORMAL HIGH (ref 0–149)
VLDL Cholesterol Cal: 34 mg/dL (ref 5–40)

## 2024-01-18 LAB — VITAMIN D 25 HYDROXY (VIT D DEFICIENCY, FRACTURES): Vit D, 25-Hydroxy: 29.6 ng/mL — AB (ref 30.0–100.0)

## 2024-01-18 LAB — TSH+FREE T4
Free T4: 0.97 ng/dL (ref 0.82–1.77)
TSH: 4.42 u[IU]/mL (ref 0.450–4.500)

## 2024-01-18 NOTE — Progress Notes (Signed)
 Dear [name], Your Vitamin D  is  low. You need a prescription strength supplement I will send that in for you. Nurse, if at all possible, could you send in a prescription for the patient for vitamin D  50,000 units, 1 p.o. weekly #13 with 3 refills? Many thanks, WS

## 2024-01-25 ENCOUNTER — Other Ambulatory Visit: Payer: Self-pay | Admitting: Family Medicine

## 2024-01-25 MED ORDER — TIRZEPATIDE-WEIGHT MANAGEMENT 5 MG/0.5ML ~~LOC~~ SOLN: 5.0000 mg | SUBCUTANEOUS | 1 refills | Status: DC

## 2024-01-25 MED ORDER — TIRZEPATIDE-WEIGHT MANAGEMENT 2.5 MG/0.5ML ~~LOC~~ SOLN: 2.5000 mg | SUBCUTANEOUS | 1 refills | Status: DC

## 2024-01-25 NOTE — Addendum Note (Signed)
 Addended by: ZOLLIE LOWERS on: 01/25/2024 10:40 AM   Modules accepted: Orders

## 2024-02-04 ENCOUNTER — Other Ambulatory Visit: Payer: Self-pay | Admitting: Family Medicine

## 2024-02-04 MED ORDER — TIRZEPATIDE 5 MG/0.5ML ~~LOC~~ SOAJ
5.0000 mg | SUBCUTANEOUS | 0 refills | Status: DC
Start: 2024-02-04 — End: 2024-02-08

## 2024-02-08 ENCOUNTER — Other Ambulatory Visit: Payer: Self-pay | Admitting: Family Medicine

## 2024-02-08 MED ORDER — ZEPBOUND 5 MG/0.5ML ~~LOC~~ SOLN: 5.0000 mg | SUBCUTANEOUS | 1 refills | Status: AC

## 2024-02-21 ENCOUNTER — Other Ambulatory Visit: Payer: Self-pay | Admitting: Family Medicine

## 2024-02-21 MED ORDER — VITAMIN D (ERGOCALCIFEROL) 1.25 MG (50000 UNIT) PO CAPS: 50000.0000 [IU] | ORAL_CAPSULE | ORAL | 3 refills | Status: AC

## 2024-04-02 ENCOUNTER — Other Ambulatory Visit: Payer: Self-pay | Admitting: Family Medicine

## 2024-04-02 DIAGNOSIS — G4733 Obstructive sleep apnea (adult) (pediatric): Secondary | ICD-10-CM

## 2024-05-23 ENCOUNTER — Other Ambulatory Visit

## 2024-05-23 ENCOUNTER — Ambulatory Visit: Payer: Medicare Other | Admitting: Internal Medicine

## 2024-05-23 ENCOUNTER — Encounter: Payer: Self-pay | Admitting: Internal Medicine

## 2024-05-23 VITALS — BP 160/100 | HR 100 | Ht 68.0 in | Wt 314.0 lb

## 2024-05-23 DIAGNOSIS — E039 Hypothyroidism, unspecified: Secondary | ICD-10-CM | POA: Diagnosis not present

## 2024-05-23 NOTE — Patient Instructions (Signed)

## 2024-05-23 NOTE — Progress Notes (Unsigned)
 "   Name: Dustin Laday Sr.  MRN/ DOB: [id number], [birth year]    Age/ Sex: 59 y.o., male    PCP: Zollie Lowers, MD   Reason for Endocrinology Evaluation: Subclinical hypothyroidism     Date of Initial Endocrinology Evaluation: 01/13/2022    HPI: [name] is a 59 y.o. male with a past medical history of Hx of tonsillar cancer, OSA. The patient presented for initial endocrinology clinic visit on 01/13/2022 for consultative assistance with his Subclinical Hypothyroidism.   Patient has been diagnosed with right  tonsillar cancer (Sq. Cell carcinoma)  in June 2021, he is S/P right neck dissection, chemo/XRT  During evaluation for fatigue by oncology, the patient has been noted with an elevated TSH in August 2022, TSH max level 5.67 uIU/ml in January 2023, this has been attributed to prior radiation treatment of the neck  On his initial visit to our clinic he was started on levothyroxine  due to a TSH of 9.99 u IU/mL with borderline free T4 at 0.65 NG/DL (0/7976)   Anti-Tpo negative  No Fh of thyroid  disease   SUBJECTIVE:    Today (05/23/2024): Dustin Burrow Sr. is here for follow-up on hypothyroidism.     Patient continues to follow-up with Dr. Jesus for Hx of oropharyngeal cancer , no evidence of recurrence   Patient continues with weight gain, he tried Zepbound  2.5 mg for ~ 2 monts but developed nausea, pending sleep study  No local neck swelling  No recent palpitations but has occasional discomfort but was seen by PCP in the summer for the this  Has occasional dysphagia due to cancer treatment  No changes in bowel movements     Levothyroxine  75 mcg daily   HISTORY:  Past Medical History:  Past Medical History:  Diagnosis Date   Arthritis    Change in stool 02/22/2017   Family history of cancer    Family history of uterine cancer    Gout 02/22/2017   Hyperlipidemia    Sleep apnea    Uses CPAP   Tonsil cancer (HCC) DX'd 07/2019   metastatic   Past  Surgical History:  Past Surgical History:  Procedure Laterality Date   CARPAL TUNNEL RELEASE Right 04/10/2019   CHOLECYSTECTOMY N/A 09/09/2020   Procedure: LAPAROSCOPIC CHOLECYSTECTOMY;  Surgeon: Kallie Manuelita BROCKS, MD;  Location: [facility];  Service: General;  Laterality: N/A;   COLONOSCOPY N/A 02/20/2021   Procedure: COLONOSCOPY;  Surgeon: Golda Claudis PENNER, MD;  Location: [facility];  Service: Endoscopy;  Laterality: N/A;  9:30   DIRECT LARYNGOSCOPY N/A 08/21/2019   Procedure: DIRECT LARYNGOSCOPY - WITH BX;  Surgeon: Jesus Oliphant, MD;  Location: [facility];  Service: ENT;  Laterality: N/A;   IR GASTROSTOMY TUBE MOD SED  10/13/2019   IR GASTROSTOMY TUBE REMOVAL  06/10/2020   IR IMAGING GUIDED PORT INSERTION  10/13/2019   IR REMOVAL TUN ACCESS W/ PORT W/O FL MOD SED  06/10/2020   NECK DISSECTION  09/11/2019   POLYPECTOMY  02/20/2021   Procedure: POLYPECTOMY;  Surgeon: Golda Claudis PENNER, MD;  Location: [facility];  Service: Endoscopy;;   RADICAL NECK DISSECTION Right 09/11/2019   Procedure: RADICAL NECK DISSECTION;  Surgeon: Jesus Oliphant, MD;  Location: [facility];  Service: ENT;  Laterality: Right;   removal of right jugular vein in neck     TONSILLECTOMY Bilateral 08/21/2019   Procedure: TONSILLECTOMY;  Surgeon: Jesus Oliphant, MD;  Location: [facility];  Service: ENT;  Laterality: Bilateral;  UMBILICAL HERNIA REPAIR  09/09/2020   Procedure: REPAIR UMBILICAL HERNIA;  Surgeon: Kallie Manuelita BROCKS, MD;  Location: [facility];  Service: General;;   WISDOM TOOTH EXTRACTION      Social History:  reports that he has never smoked. He has never used smokeless tobacco. He reports that he does not drink alcohol and does not use drugs. Family History: family history includes COPD in his mother; Cancer in his cousin and other family members; Cervical cancer in his maternal aunt; Lung cancer in his maternal grandfather, maternal grandmother, and another family member; Lung cancer (age of onset:  66) in his brother; Pancreatic cancer (age of onset: 74) in his cousin; Prostate cancer in his paternal grandfather; Stomach cancer in his paternal grandmother; Stomach cancer (age of onset: 68) in his cousin; Uterine cancer (age of onset: 57) in his mother.   HOME MEDICATIONS: Allergies as of 05/23/2024   No Known Allergies      Medication List        Accurate as of May 23, 2024  9:13 AM. If you have any questions, ask your nurse or doctor.          STOP taking these medications    predniSONE  10 MG tablet Commonly known as: DELTASONE  Stopped by: Donell Butts, MD       TAKE these medications    Colchicine  0.6 MG Caps Commonly known as: Mitigare  Take every two hours at onset of gout attack until pain resolves or 6 have been taken. Then decrease to two daily until flare ends   fluticasone 50 MCG/ACT nasal spray Commonly known as: FLONASE Place 2 sprays into both nostrils daily as needed for allergies.   ibuprofen  200 MG tablet Commonly known as: ADVIL  Take 600 mg by mouth every 8 (eight) hours as needed for fever, headache or mild pain.   levothyroxine  75 MCG tablet Commonly known as: SYNTHROID  Take 1 tablet (75 mcg total) by mouth daily.   loratadine  10 MG tablet Commonly known as: CLARITIN  Take 10 mg by mouth daily.   meloxicam 15 MG tablet Commonly known as: MOBIC Take 15 mg by mouth daily as needed for pain.   Vitamin D  (Ergocalciferol ) 1.25 MG (50000 UNIT) Caps capsule Commonly known as: DRISDOL  Take 1 capsule (50,000 Units total) by mouth every 7 (seven) days.   Zepbound  5 MG/0.5ML injection vial Generic drug: tirzepatide  Inject 5 mg into the skin once a week.          REVIEW OF SYSTEMS: A comprehensive ROS was conducted with the patient and is negative except as per HPI     OBJECTIVE:  VS: BP (!) 160/100   Pulse 100   Ht 5' 8 (1.727 m)   Wt (!) 314 lb (142.4 kg)   SpO2 97%   BMI 47.74 kg/m    Wt Readings from Last 3  Encounters:  05/23/24 (!) 314 lb (142.4 kg)  01/17/24 (!) 312 lb (141.5 kg)  01/05/24 (!) 308 lb (139.7 kg)     EXAM: General: Pt appears well and is in NAD  Neck: General: Supple without adenopathy. Thyroid :  No goiter or nodules appreciated.   Lungs: Clear with good BS bilat   Heart: Auscultation: RRR.  Mental Status: Judgment, insight: Intact Orientation: Oriented to time, place, and person Mood and affect: No depression, anxiety, or agitation     DATA REVIEWED:  Latest Reference Range & Units 05/23/24 09:37  TSH 0.40 - 4.50 mIU/L 2.99  T4,Free(Direct) 0.8 - 1.8 ng/dL 1.2  ASSESSMENT/PLAN/RECOMMENDATIONS:   Hypothyroidism:  -Patient is clinically euthyroid - TPO Ab's negative  - TFTs remain within normal range, no change   Medications : Continue levothyroxine  75 mcg daily     Follow-up in 1 yr    Signed electronically by: Stefano Redgie Butts, MD  [facility]  [facility] [address] [place], [place] [address] Phone: [phone] FAX: [phone]   CC: Zollie Lowers, MD [address] [place] [address] Phone: [phone] Fax: [phone]   Return to Endocrinology clinic as below: Future Appointments  Date Time Provider Department Center  01/05/2025  9:20 AM [facility]ANNUAL WELLNESS VISIT [facility] [address]        "

## 2024-05-24 ENCOUNTER — Ambulatory Visit: Payer: Self-pay | Admitting: Internal Medicine

## 2024-05-24 LAB — T4, FREE: Free T4: 1.2 ng/dL (ref 0.8–1.8)

## 2024-05-24 LAB — TSH: TSH: 2.99 m[IU]/L (ref 0.40–4.50)

## 2024-05-24 MED ORDER — LEVOTHYROXINE SODIUM 75 MCG PO TABS: 75.0000 ug | ORAL_TABLET | Freq: Every day | ORAL | 3 refills | Status: AC

## 2025-01-05 ENCOUNTER — Ambulatory Visit: Payer: Self-pay

## 2025-05-21 ENCOUNTER — Ambulatory Visit: Admitting: Internal Medicine
